# Patient Record
Sex: Female | Born: 1940 | Race: White | Hispanic: No | Smoking: Never smoker
Health system: Southern US, Community
[De-identification: ages and names within clinical notes are randomized; demographics above are authoritative.]

## PROBLEM LIST (undated history)

## (undated) DIAGNOSIS — I10 Essential (primary) hypertension: Secondary | ICD-10-CM

## (undated) DIAGNOSIS — F32A Depression, unspecified: Secondary | ICD-10-CM

## (undated) DIAGNOSIS — K8020 Calculus of gallbladder without cholecystitis without obstruction: Secondary | ICD-10-CM

## (undated) DIAGNOSIS — E7800 Pure hypercholesterolemia, unspecified: Secondary | ICD-10-CM

## (undated) DIAGNOSIS — Z9889 Other specified postprocedural states: Secondary | ICD-10-CM

## (undated) DIAGNOSIS — F0390 Unspecified dementia without behavioral disturbance: Secondary | ICD-10-CM

## (undated) DIAGNOSIS — F419 Anxiety disorder, unspecified: Secondary | ICD-10-CM

## (undated) DIAGNOSIS — I499 Cardiac arrhythmia, unspecified: Secondary | ICD-10-CM

## (undated) DIAGNOSIS — F329 Major depressive disorder, single episode, unspecified: Secondary | ICD-10-CM

## (undated) DIAGNOSIS — K219 Gastro-esophageal reflux disease without esophagitis: Secondary | ICD-10-CM

## (undated) DIAGNOSIS — K635 Polyp of colon: Secondary | ICD-10-CM

## (undated) DIAGNOSIS — H269 Unspecified cataract: Secondary | ICD-10-CM

## (undated) DIAGNOSIS — R112 Nausea with vomiting, unspecified: Secondary | ICD-10-CM

## (undated) HISTORY — PX: DILATION AND CURETTAGE OF UTERUS: SHX78

## (undated) HISTORY — DX: Polyp of colon: K63.5

## (undated) HISTORY — PX: APPENDECTOMY: SHX54

## (undated) HISTORY — PX: TUBAL LIGATION: SHX77

## (undated) HISTORY — PX: CHOLECYSTECTOMY: SHX55

## (undated) HISTORY — DX: Anxiety disorder, unspecified: F41.9

## (undated) HISTORY — DX: Unspecified cataract: H26.9

## (undated) HISTORY — DX: Pure hypercholesterolemia, unspecified: E78.00

## (undated) HISTORY — DX: Calculus of gallbladder without cholecystitis without obstruction: K80.20

## (undated) HISTORY — DX: Major depressive disorder, single episode, unspecified: F32.9

## (undated) HISTORY — DX: Essential (primary) hypertension: I10

## (undated) HISTORY — DX: Gastro-esophageal reflux disease without esophagitis: K21.9

## (undated) HISTORY — DX: Depression, unspecified: F32.A

---

## 1997-10-30 ENCOUNTER — Encounter: Admission: RE | Admit: 1997-10-30 | Discharge: 1998-01-28 | Payer: Self-pay | Admitting: Family Medicine

## 1998-03-11 ENCOUNTER — Other Ambulatory Visit: Admission: RE | Admit: 1998-03-11 | Discharge: 1998-03-11 | Payer: Self-pay | Admitting: Gynecology

## 2000-01-19 ENCOUNTER — Other Ambulatory Visit: Admission: RE | Admit: 2000-01-19 | Discharge: 2000-01-19 | Payer: Self-pay | Admitting: Gynecology

## 2001-06-29 ENCOUNTER — Other Ambulatory Visit: Admission: RE | Admit: 2001-06-29 | Discharge: 2001-06-29 | Payer: Self-pay | Admitting: Gynecology

## 2004-03-05 ENCOUNTER — Other Ambulatory Visit: Admission: RE | Admit: 2004-03-05 | Discharge: 2004-03-05 | Payer: Self-pay | Admitting: Gynecology

## 2004-11-30 ENCOUNTER — Ambulatory Visit: Payer: Self-pay | Admitting: Family Medicine

## 2005-01-20 ENCOUNTER — Ambulatory Visit: Payer: Self-pay | Admitting: Family Medicine

## 2005-02-22 ENCOUNTER — Ambulatory Visit: Payer: Self-pay | Admitting: Family Medicine

## 2005-03-24 ENCOUNTER — Ambulatory Visit: Payer: Self-pay | Admitting: Family Medicine

## 2005-04-29 ENCOUNTER — Ambulatory Visit: Payer: Self-pay | Admitting: Family Medicine

## 2005-06-16 ENCOUNTER — Ambulatory Visit: Payer: Self-pay | Admitting: Family Medicine

## 2005-06-22 ENCOUNTER — Other Ambulatory Visit: Admission: RE | Admit: 2005-06-22 | Discharge: 2005-06-22 | Payer: Self-pay | Admitting: Gynecology

## 2005-07-08 ENCOUNTER — Encounter (INDEPENDENT_AMBULATORY_CARE_PROVIDER_SITE_OTHER): Payer: Self-pay | Admitting: *Deleted

## 2005-07-08 ENCOUNTER — Ambulatory Visit (HOSPITAL_COMMUNITY): Admission: RE | Admit: 2005-07-08 | Discharge: 2005-07-08 | Payer: Self-pay | Admitting: Gynecology

## 2005-08-30 ENCOUNTER — Ambulatory Visit: Payer: Self-pay | Admitting: Family Medicine

## 2006-01-10 ENCOUNTER — Ambulatory Visit: Payer: Self-pay | Admitting: Family Medicine

## 2006-03-16 ENCOUNTER — Ambulatory Visit: Payer: Self-pay | Admitting: Family Medicine

## 2006-04-25 ENCOUNTER — Encounter (INDEPENDENT_AMBULATORY_CARE_PROVIDER_SITE_OTHER): Payer: Self-pay | Admitting: Specialist

## 2006-04-25 ENCOUNTER — Ambulatory Visit (HOSPITAL_COMMUNITY): Admission: RE | Admit: 2006-04-25 | Discharge: 2006-04-25 | Payer: Self-pay | Admitting: Gynecology

## 2006-08-01 ENCOUNTER — Ambulatory Visit: Payer: Self-pay | Admitting: Family Medicine

## 2006-11-02 ENCOUNTER — Ambulatory Visit: Payer: Self-pay | Admitting: Family Medicine

## 2006-11-04 ENCOUNTER — Encounter: Payer: Self-pay | Admitting: Cardiology

## 2006-11-04 ENCOUNTER — Ambulatory Visit: Payer: Self-pay | Admitting: Cardiology

## 2007-08-16 ENCOUNTER — Encounter: Admission: RE | Admit: 2007-08-16 | Discharge: 2007-08-16 | Payer: Self-pay | Admitting: Family Medicine

## 2007-08-24 ENCOUNTER — Encounter (INDEPENDENT_AMBULATORY_CARE_PROVIDER_SITE_OTHER): Payer: Self-pay | Admitting: Gynecology

## 2007-08-24 ENCOUNTER — Ambulatory Visit (HOSPITAL_COMMUNITY): Admission: RE | Admit: 2007-08-24 | Discharge: 2007-08-24 | Payer: Self-pay | Admitting: Gynecology

## 2008-05-14 HISTORY — PX: ARTHROSCOPIC REPAIR ACL: SUR80

## 2008-06-25 ENCOUNTER — Encounter: Admission: RE | Admit: 2008-06-25 | Discharge: 2008-09-23 | Payer: Self-pay | Admitting: Orthopedic Surgery

## 2008-07-18 ENCOUNTER — Ambulatory Visit: Payer: Self-pay

## 2010-03-03 ENCOUNTER — Ambulatory Visit: Payer: Self-pay | Admitting: Cardiology

## 2010-03-11 ENCOUNTER — Telehealth (INDEPENDENT_AMBULATORY_CARE_PROVIDER_SITE_OTHER): Payer: Self-pay | Admitting: *Deleted

## 2010-03-12 ENCOUNTER — Ambulatory Visit: Payer: Self-pay

## 2010-03-12 ENCOUNTER — Encounter: Payer: Self-pay | Admitting: Cardiology

## 2010-03-12 ENCOUNTER — Encounter: Payer: Self-pay | Admitting: Internal Medicine

## 2010-03-12 ENCOUNTER — Encounter (HOSPITAL_COMMUNITY)
Admission: RE | Admit: 2010-03-12 | Discharge: 2010-03-20 | Payer: Self-pay | Source: Home / Self Care | Admitting: Cardiology

## 2010-03-12 ENCOUNTER — Ambulatory Visit: Payer: Self-pay | Admitting: Cardiology

## 2010-03-17 ENCOUNTER — Ambulatory Visit: Payer: Self-pay | Admitting: Cardiology

## 2010-07-05 ENCOUNTER — Encounter: Payer: Self-pay | Admitting: Family Medicine

## 2010-07-16 NOTE — Assessment & Plan Note (Signed)
Summary: Cardiology Nuclear Testing  Nuclear Med Background Indications for Stress Test: Evaluation for Ischemia     Symptoms: Chest Pain, DOE, SOB    Nuclear Pre-Procedure Cardiac Risk Factors: Hypertension, Lipids Caffeine/Decaff Intake: None NPO After: 7:30 AM Lungs: clear IV 0.9% NS with Angio Cath: 22g     IV Site: R Hand IV Started by: Bonnita Levan, RN Chest Size (in) 42     Cup Size D     Height (in): 62 Weight (lb): 225 BMI: 41.30  Nuclear Med Study 1 or 2 day study:  1 day     Stress Test Type:  Treadmill/Lexiscan Reading MD:  Olga Millers, MD     Referring MD:  S.Tennant Resting Radionuclide:  Technetium 56m Tetrofosmin     Resting Radionuclide Dose:  11 mCi  Stress Radionuclide:  Technetium 22m Tetrofosmin     Stress Radionuclide Dose:  32.9 mCi   Stress Protocol   Lexiscan: 0.4 mg   Stress Test Technologist:  Milana Na, EMT-P     Nuclear Technologist:  Domenic Polite, CNMT  Rest Procedure  Myocardial perfusion imaging was performed at rest 45 minutes following the intravenous administration of Technetium 6m Tetrofosmin.  Stress Procedure  The patient received IV Lexiscan 0.4 mg over 15-seconds with concurrent low level exercise and then Technetium 25m Tetrofosmin was injected at 30-seconds while the patient continued walking one more minute.  There were no significant changes with Lexiscan.  Quantitative spect images were obtained after a 45 minute delay.  QPS Raw Data Images:  There is a breast shadow. Stress Images:  There is decreased uptake in the distal anterior wall. Rest Images:  There is decreased uptake in the distal anterior wall, less prominent compared to the stress images. Subtraction (SDS):  These findings are consistent with probable shifting breast attenuation; cannot exclude mild anteroapical ischemia. Transient Ischemic Dilatation:  .64  (Normal <1.22)  Lung/Heart Ratio:  .22  (Normal <0.45)  Quantitative Gated Spect  Images QGS EDV:  44 ml QGS ESV:  13 ml QGS EF:  71 % QGS cine images:  Normal wall motion.   Overall Impression  Exercise Capacity: Lexiscan with no exercise. BP Response: Normal blood pressure response. Clinical Symptoms: No chest pain ECG Impression: No significant ST segment change suggestive of ischemia. Overall Impression: Probably normal lexiscan nuclear study with shifting breast attenuation; cannot exclude mild anteroapical ischemia.

## 2010-07-16 NOTE — Progress Notes (Signed)
Summary: nuc pre procedure  Phone Note Outgoing Call Call back at Home Phone [phone]   Call placed by: Cathlyn Parsons RN,  March 11, 2010 5:29 PM Call placed to: Patient Reason for Call: Confirm/change Appt Summary of Call: Reviewed information on My[employer] Information Sheet (see scanned document for further details).  Spoke with patient.      Nuclear Med Background Indications for Stress Test: Evaluation for Ischemia     Symptoms: Chest Pain, SOB    Nuclear Pre-Procedure Cardiac Risk Factors: Hypertension, Lipids

## 2010-10-27 NOTE — Op Note (Signed)
Donna Barron, Donna Barron                ACCOUNT NO.:  [id number]   MEDICAL RECORD NO.:  [id number]          PATIENT TYPE:  AMB   LOCATION:  SDC                           [facility]ILITY:  [facility]   PHYSICIAN:  Luvenia Redden, M.D.   DATE OF BIRTH:  [birth year]   DATE OF PROCEDURE:  08/24/2007  DATE OF DISCHARGE:  08/24/2007                               OPERATIVE REPORT   POSTOPERATIVE DIAGNOSIS:  Postmenopausal bleeding.   POSTOPERATIVE DIAGNOSIS:  1. Postmenopausal bleeding.  2. Small endometrial polyp.  3. Uterine myoma.   PROCEDURE:  Hysteroscopy and dilatation and curettage.   SURGEON:  Luvenia Redden, M.D.   ANESTHESIA:  Monitored anesthesia care plus paracervical block.   PROCEDURE:  Under sedation, the patient was placed in the lithotomy  position and prepped and draped in a sterile manner.  The pelvic exam  was really nonrevealing.  Due to the patient's girth and her abdomen, it  is very difficult to palpate anything.  I did not feel any masses.  The  cervix was grasped with a tenaculum and a paracervical block was  performed by injecting 1% Lidocaine 10 mL in the 4 o'clock position and  the 8 o'clock position paracervically.  The uterus then sounded to a  depth of 10.5 cm.  The cervix was dilated.  The hysteroscope was placed  into the cervical canal using Sorbitol as the distending medium.  The  scope was advanced in the cervical canal and the endometrial cavity were  examined.  There were a couple of small polypoid structures just inside  the internal os on the anterior right hand side.  There was some reddish  appearing endometrium but no large polyps seen.  Both ostia were viewed  and were normal.  Centrally in the fundus there was a defect protruding  into the endometrial cavity that was either a partial septum or what I  believed to be a submucous myoma.  The scope was then retracted and then  the cervical curettage was done and this was sent as a separate  specimen.  A  thorough endometrial curettage was then done and a small to  moderate amount of tissue was obtained.  The endometrial cavity was then  wiped with a dry sponge and was clean.  The procedure was then  terminated.  There was no excess bleeding.  Blood loss was minimal.  Fluid deficit of Sorbitol was 20 mL.  The patient tolerated the  procedure very nicely.  She was removed to recovery in good condition.           ______________________________  Luvenia Redden, M.D.     WSB/MEDQ  D:  08/24/2007  T:  08/24/2007  Job:  [id number]

## 2010-10-30 NOTE — Op Note (Signed)
NAMEMEILI, KLECKLEY NO.:  [id number]   MEDICAL RECORD NO.:  [id number]          PATIENT TYPE:  AMB   LOCATION:  SDC                           [facility]ILITY:  [facility]   PHYSICIAN:  Luvenia Redden, M.D.   DATE OF BIRTH:  September 18, 1940   DATE OF PROCEDURE:  04/25/2006  DATE OF DISCHARGE:                                 OPERATIVE REPORT   PREOPERATIVE DIAGNOSIS:  Postmenopausal bleeding.   POSTOPERATIVE DIAGNOSIS:  Postmenopausal bleeding.   OPERATION PERFORMED:  Dilation and curettage.   SURGEON:  Luvenia Redden, M.D.   ANESTHESIA:  MAC and paracervical block.   DESCRIPTION OF PROCEDURE:  Under good sedation, the patient prepped and  draped in sterile manner, the bladder was catheterized.  Exam revealed the  uterus to be retroflexed, slightly enlarged, no adnexal masses were  palpable.  The cervix was grasped with a tenaculum.  Paracervical block was  performed by injecting 9 mL of 1% lidocaine at 4 and 8 o'clock positions  paracervically.  Once this was accomplished, the uterus sounded to a depth  of four inches. The cervix was dilated. An endocervical curettage was done  and this was sent as a separate specimen.  The endometrial cavity was then  explored with the polyp forceps and one solitary polyp approximately 1 cm in  diameter was obtained.  This was sent with the endometrial specimen.  The  endometrial cavity was then scraped thoroughly with a sharp curette.  A  small amount of normal-appearing tissue was obtained.  The endometrial  cavity was wiped then with a dry sponge and deemed to be clean.  The cavity  again was explored with polyp forceps and no tissue was obtained.  The  patient tolerated the procedure well.  She was given some IV Toradol and  removed to recovery room in good condition.           ______________________________  Luvenia Redden, M.D.     WSB/MEDQ  D:  04/25/2006  T:  04/25/2006  Job:  [id number]

## 2010-10-30 NOTE — Op Note (Signed)
NAMEJAELIN, DEVINCENTIS NO.:  [id number]   MEDICAL RECORD NO.:  [id number]          PATIENT TYPE:  AMB   LOCATION:  SDC                           [facility]ILITY[facility]   PHYSICIAN:  Luvenia Redden, M.D.   DATE OF BIRTH:  1940/10/11   DATE OF PROCEDURE:  07/08/2005  DATE OF DISCHARGE:                                 OPERATIVE REPORT   PREOPERATIVE DIAGNOSIS:  Postmenopausal bleeding.   POSTOPERATIVE DIAGNOSES:  1.  Postmenopausal bleeding.  2.  Small uterine fibroids.   OPERATION:  Dilatation and curettage.   SURGEON:  Luvenia Redden, M.D.   PROCEDURE:  Under good sedation with Diprivan, the patient was placed in the  lithotomy position and prepped and draped in a sterile manner.  The bladder  was catheterized.  The uterus was retroflexed and slightly enlarged and  irregular.  The cervix was grasped with a tenaculum and the uterine cavity  sounded to a depth of 10.5 cm.  The cervix was dilated without difficulty.  The cavity was explored with polyp forceps and minimal tissue was obtained.  Endocervical curettage was done, and this was sent as a separate specimen.  A thorough curettage of the uterine cavity was done and a small to moderate  amount of tissue was obtained.  The endometrial cavity was wiped with a dry  sponge and was clean.  Blood loss was estimated at 25 mL, none was replaced.  The patient tolerated the procedure well and was removed to the recovery  room in good condition.           ______________________________  Luvenia Redden, M.D.     WSB/MEDQ  D:  07/08/2005  T:  07/08/2005  Job:  [id number]

## 2011-03-08 LAB — BASIC METABOLIC PANEL
CO2: 31
Chloride: 96
GFR calc Af Amer: >60
Glucose, Bld: 121 — ABNORMAL HIGH
Sodium: 138

## 2011-03-08 LAB — CBC
HCT: 46.4 — ABNORMAL HIGH
MCV: 89.9

## 2011-11-17 ENCOUNTER — Encounter: Payer: Self-pay | Admitting: Cardiology

## 2013-06-28 ENCOUNTER — Ambulatory Visit (INDEPENDENT_AMBULATORY_CARE_PROVIDER_SITE_OTHER): Payer: Medicare Other | Admitting: Interventional Cardiology

## 2013-06-28 ENCOUNTER — Encounter: Payer: Self-pay | Admitting: Cardiology

## 2013-06-28 ENCOUNTER — Encounter: Payer: Self-pay | Admitting: Interventional Cardiology

## 2013-06-28 VITALS — BP 137/76 | HR 69 | Ht 62.0 in | Wt 217.8 lb

## 2013-06-28 DIAGNOSIS — R002 Palpitations: Secondary | ICD-10-CM | POA: Insufficient documentation

## 2013-06-28 DIAGNOSIS — E669 Obesity, unspecified: Secondary | ICD-10-CM

## 2013-06-28 DIAGNOSIS — R079 Chest pain, unspecified: Secondary | ICD-10-CM

## 2013-06-28 DIAGNOSIS — E782 Mixed hyperlipidemia: Secondary | ICD-10-CM | POA: Insufficient documentation

## 2013-06-28 NOTE — Patient Instructions (Signed)
Your physician has recommended that you wear an event monitor. Event monitors are medical devices that record the heart's electrical activity. Doctors most often [place] these monitors to diagnose arrhythmias. Arrhythmias are problems with the speed or rhythm of the heartbeat. The monitor is a small, portable device. You can wear one while you do your normal daily activities. This is usually used to diagnose what is causing palpitations/syncope (passing out).  Your physician recommends that you schedule a follow-up appointment in: 6 weeks with Dr. Irish Lack.

## 2013-06-28 NOTE — Progress Notes (Signed)
Patient ID: Donna Barron, female   DOB: [birth year], 73 y.o.   MRN: [id number]     Patient ID: Donna Barron MRN: [id number] DOB/AGE: Nov 18, 1940 73 y.o.   Referring Physician Dr. Edrick Oh   Reason for Consultation palpitations  HPI: 6 y/o who has had palpitations for years. She was on a beta blocker for this. She saw Dr. Doreatha Lew and had a stress test and maybe a monitor, all of which were normal.  Now, the palpitations happen anywhere from 1/week to 1 in several months.  Sx last a few minutes.  No lightheadedness or syncope.  During the palpitations, there is some chest tightness.  She notes a drop in her BP.  No dizziness with that.  On New Years day, she had 4 episodes which were short.  She woke up sweaty with some sensation in her neck that her heart was beating fast.    No regular walking or exercise.  No problems with waking in the grocery store.  She wants to join the [facility] again. No problems walking the dog.    Current Outpatient Prescriptions  Medication Sig Dispense Refill  . aspirin 81 MG tablet Take 81 mg by mouth daily.      . citalopram (CELEXA) 20 MG tablet Take 20 mg by mouth daily.       Marland Kitchen ezetimibe-simvastatin (VYTORIN) 10-20 MG per tablet Take 1 tablet by mouth daily.      . fluticasone (FLONASE) 50 MCG/ACT nasal spray Place 1 spray into both nostrils daily.      . metoprolol succinate (TOPROL-XL) 50 MG 24 hr tablet Take 75 mg by mouth daily.       . pantoprazole (PROTONIX) 40 MG tablet Take 40 mg by mouth daily.       Marland Kitchen triamterene-hydrochlorothiazide (MAXZIDE) 75-50 MG per tablet Take 0.5 tablets by mouth daily.        No current facility-administered medications for this visit.   Past Medical History  Diagnosis Date  . Hypercholesteremia   . HTN (hypertension)   . GERD (gastroesophageal reflux disease)   . Depression     Family History  Problem Relation Age of Onset  . Heart failure Mother 8  . Lung cancer Father 45    History   Social History  .  Marital Status: Widowed    Spouse Name: N/A    Number of Children: N/A  . Years of Education: N/A   Occupational History  . retired Other   Social History Main Topics  . Smoking status: Never Smoker   . Smokeless tobacco: Not on file  . Alcohol Use: Yes     Comment: rarely  . Drug Use: Not on file  . Sexual Activity: Not on file   Other Topics Concern  . Not on file   Social History Narrative  . No narrative on file    Past Surgical History  Procedure Laterality Date  . Cholecystectomy    . Appendectomy    . Tubal ligation    . Dilation and curettage of uterus    . Arthroscopic repair acl  05/2008    Left knee      (Not in a hospital admission)  Review of systems complete and found to be negative unless listed above .  No nausea, vomiting.  No fever chills, No focal weakness,  No palpitations.  Physical Exam: Filed Vitals:   06/28/13 1101  BP: 137/76  Pulse: 69    Weight: 217 lb 12.8 oz (98.793  kg)  Physical exam:  [place]/AT EOMI No JVD, No carotid bruit RRR S1S2  No wheezing Soft. NT, nondistended No edema. No focal motor or sensory deficits Normal affect  Labs:   Lab Results  Component Value Date   WBC 8.0 08/21/2007   HGB 15.8* 08/21/2007   HCT 46.4* 08/21/2007   MCV 89.9 08/21/2007   PLT 333 08/21/2007   No results found for this basename: NA, K, CL, CO2, BUN, CREATININE, CALCIUM, LABALBU, PROT, BILITOT, ALKPHOS, ALT, AST, GLUCOSE,  in the last 168 hours No results found for this basename: CKTOTAL, CKMB, CKMBINDEX, TROPONINI    No results found for this basename: CHOL   No results found for this basename: HDL   No results found for this basename: LDLCALC   No results found for this basename: TRIG   No results found for this basename: CHOLHDL   No results found for this basename: LDLDIRECT      Radiology: Prior stress test in 2011 showed possible anteroapical area of ischemia, but this is thought to be from breast attenuation. Echocardiogram from  2008 showed normal left ventricular function. EKG: Normal sinus rhythm, no significant ST segment changes  ASSESSMENT AND PLAN:   1) palpitations: Unclear etiology. Events are somewhat rare. We'll plan for a 30 day event monitor. Hopefully, we can capture whatever her arrhythmia is. She will continue on Toprol at this time.  No associated syncope or lightheadedness. I doubt this is a life-threatening arrhythmia.  I reviewed the results of her prior cardiac workup as noted above.  2) Obesity: I encouraged her to join the [employer] as she is planning.  If she develops any symptoms with exercise, she will let us know.  3) hyperlipidemia: Continue Vytorin. She has stopped this medication on her own. I stressed the importance of taking a lipid-lowering therapy.  4) chest discomfort: This only occurs with the palpitations. If this starts to get more frequent, we would consider repeat stress testing versus cardiac catheterization. Right now, she has no exertional chest discomfort. We'll see she does with increased activity. Continue beta blocker.  Continue aspirin.   Signed:   Mina Marble, MD, [facility] 06/28/2013, 11:24 AM

## 2013-07-02 ENCOUNTER — Encounter: Payer: Self-pay | Admitting: *Deleted

## 2013-07-02 ENCOUNTER — Encounter (INDEPENDENT_AMBULATORY_CARE_PROVIDER_SITE_OTHER): Payer: Medicare Other

## 2013-07-02 DIAGNOSIS — R002 Palpitations: Secondary | ICD-10-CM

## 2013-07-02 NOTE — Progress Notes (Unsigned)
Patient ID: Donna Barron, female   DOB: [birth year], 73 y.o.   MRN: [id number] [employer] 30 day cardiac event monitor applied to patient.

## 2013-08-07 ENCOUNTER — Ambulatory Visit: Payer: PRIVATE HEALTH INSURANCE | Admitting: Interventional Cardiology

## 2013-08-17 ENCOUNTER — Telehealth: Payer: Self-pay | Admitting: Cardiology

## 2013-08-17 NOTE — Telephone Encounter (Signed)
Dr. Hassell Done Interpretation of 30 day heart monitor: NSR with PVC's. No pathological arrhythmias.

## 2013-08-22 NOTE — Telephone Encounter (Signed)
Pt.notified

## 2013-09-27 ENCOUNTER — Encounter (INDEPENDENT_AMBULATORY_CARE_PROVIDER_SITE_OTHER): Payer: Self-pay

## 2013-09-27 ENCOUNTER — Encounter: Payer: Self-pay | Admitting: Interventional Cardiology

## 2013-09-27 ENCOUNTER — Ambulatory Visit (INDEPENDENT_AMBULATORY_CARE_PROVIDER_SITE_OTHER): Payer: Medicare Other | Admitting: Interventional Cardiology

## 2013-09-27 VITALS — BP 118/73 | HR 77 | Ht 62.0 in | Wt 217.8 lb

## 2013-09-27 DIAGNOSIS — R002 Palpitations: Secondary | ICD-10-CM

## 2013-09-27 DIAGNOSIS — E669 Obesity, unspecified: Secondary | ICD-10-CM

## 2013-09-27 DIAGNOSIS — E782 Mixed hyperlipidemia: Secondary | ICD-10-CM

## 2013-09-27 NOTE — Patient Instructions (Signed)
Your physician recommends that you schedule a follow-up appointment as needed  

## 2013-09-27 NOTE — Progress Notes (Signed)
Patient ID: Donna Barron, female   DOB: 15-Sep-1940, 73 y.o.   MRN: [id number] Patient ID: Donna Barron, female   DOB: [birth year], 73 y.o.   MRN: [id number]     Patient ID: Donna Barron MRN: [id number] DOB/AGE: January 26, 1941 73 y.o   Referring Physician Dr. Edrick Oh   Reason for Consultation palpitations  HPI: 73 y.o who has had palpitations for years. She was on a beta blocker for this. She saw Dr. Doreatha Lew and had a stress test and maybe a monitor, all of which were normal.  Now, the palpitations happen anywhere from 1/week to 1 in several months.  Sx last a few minutes.  No lightheadedness or syncope.  During the palpitations, there is some chest tightness.  She notes a drop in her BP.  No dizziness with that.  On New Years day, she had 4 episodes which were short.  She woke up sweaty with some sensation in her neck that her heart was beating fast.    Now using treadmill for walking/ exercise.  No further palpitations. No chest pain.   Current Outpatient Prescriptions  Medication Sig Dispense Refill  . aspirin 81 MG tablet Take 81 mg by mouth daily.      . citalopram (CELEXA) 20 MG tablet Take 20 mg by mouth daily.       . fluticasone (FLONASE) 50 MCG/ACT nasal spray Place 1 spray into both nostrils daily.      . metoprolol succinate (TOPROL-XL) 50 MG 24 hr tablet Take 75 mg by mouth daily.       . pantoprazole (PROTONIX) 40 MG tablet Take 40 mg by mouth daily.       . simvastatin (ZOCOR) 40 MG tablet       . triamterene-hydrochlorothiazide (MAXZIDE) 75-50 MG per tablet Take 0.5 tablets by mouth daily.        No current facility-administered medications for this visit.   Past Medical History  Diagnosis Date  . Hypercholesteremia   . HTN (hypertension)   . GERD (gastroesophageal reflux disease)   . Depression     Family History  Problem Relation Age of Onset  . Heart failure Mother 40  . Lung cancer Father 55    History   Social History  . Marital Status: Widowed   Spouse Name: N/A    Number of Children: N/A  . Years of Education: N/A   Occupational History  . retired Other   Social History Main Topics  . Smoking status: Never Smoker   . Smokeless tobacco: Not on file  . Alcohol Use: Yes     Comment: rarely  . Drug Use: Not on file  . Sexual Activity: Not on file   Other Topics Concern  . Not on file   Social History Narrative  . No narrative on file    Past Surgical History  Procedure Laterality Date  . Cholecystectomy    . Appendectomy    . Tubal ligation    . Dilation and curettage of uterus    . Arthroscopic repair acl  05/2008    Left knee      (Not in a hospital admission)  Review of systems complete and found to be negative unless listed above .  No nausea, vomiting.  No fever chills, No focal weakness,  No palpitations.  Physical Exam: Filed Vitals:   09/27/13 1111  BP: 118/73  Pulse: 77    Weight: 217 lb 12.8 oz (98.793 kg)  Physical exam:  [place]/AT EOMI  No JVD, No carotid bruit RRR S1S2  No wheezing Soft. NT, nondistended No edema. No focal motor or sensory deficits Normal affect  Labs:   Lab Results  Component Value Date   WBC 8.0 08/21/2007   HGB 15.8* 08/21/2007   HCT 46.4* 08/21/2007   MCV 89.9 08/21/2007   PLT 333 08/21/2007   No results found for this basename: NA, K, CL, CO2, BUN, CREATININE, CALCIUM, LABALBU, PROT, BILITOT, ALKPHOS, ALT, AST, GLUCOSE,  in the last 168 hours No results found for this basename: CKTOTAL,  CKMB,  CKMBINDEX,  TROPONINI    No results found for this basename: CHOL   No results found for this basename: HDL   No results found for this basename: LDLCALC   No results found for this basename: TRIG   No results found for this basename: CHOLHDL   No results found for this basename: LDLDIRECT      Radiology: Prior stress test in 2011 showed possible anteroapical area of ischemia, but this is thought to be from breast attenuation. Echocardiogram from 2008 showed normal left  ventricular function. EKG: Normal sinus rhythm, no significant ST segment changes  ASSESSMENT AND PLAN:   1) palpitations:  30 day event monitor showed NSR with PVCs.  No pathologic arrhythmias. She will continue on Toprol at this time.  No associated syncope or lightheadedness.  Sx have not returned since New Years Day.  2) Obesity: I encouraged her to join the [facility] as she is planning.  If she develops any symptoms with exercise, she will let us know.  3) hyperlipidemia: Was on Vytorin. She has stopped this medication on her own. She is now on simvastatin- to be checked by Dr. Edrick Oh. I stressed the importance of taking a lipid-lowering therapy.  4) chest discomfort: Resolved since palpitations are gone. Continue beta blocker.  Continue aspirin.   Signed:   Mina Marble, MD, [facility] 09/27/2013, 11:54 AM

## 2013-10-29 ENCOUNTER — Other Ambulatory Visit: Payer: Self-pay | Admitting: Gynecology

## 2013-11-15 DIAGNOSIS — I10 Essential (primary) hypertension: Secondary | ICD-10-CM | POA: Insufficient documentation

## 2013-12-10 ENCOUNTER — Encounter (HOSPITAL_COMMUNITY): Payer: Self-pay | Admitting: Pharmacy Technician

## 2013-12-17 ENCOUNTER — Other Ambulatory Visit: Payer: Self-pay | Admitting: Obstetrics and Gynecology

## 2013-12-17 NOTE — Patient Instructions (Addendum)
Your procedure is scheduled on: Tuesday, December 25, 2013  Enter through the [employer] [facility] at: [place] up the phone at the desk and dial [phone].  Call this number if you have problems the morning of surgery: [phone].  Remember: Do NOT eat food: After midnight Monday, December 24, 2013 Do NOT drink clear liquids after: After midnight Monday, December 24, 2013 Take these medicines the morning of surgery with a SIP OF WATER: Metoprolol, Triamt/HCTZ, Protonix,   Do NOT wear jewelry (body piercing), metal hair clips/bobby pins, make-up, or nail polish. Do NOT wear lotions, powders, or perfumes.  You may wear deoderant. Do NOT shave for 48 hours prior to surgery. Do NOT bring valuables to the hospital. Contacts, dentures, or bridgework may not be worn into surgery. Have a responsible adult drive you home and stay with you for 24 hours after your procedure

## 2013-12-19 ENCOUNTER — Encounter (HOSPITAL_COMMUNITY)
Admission: RE | Admit: 2013-12-19 | Discharge: 2013-12-19 | Disposition: A | Discharge status: Home / Self Care | Payer: Medicare Other | Source: Ambulatory Visit | Attending: Obstetrics and Gynecology | Admitting: Obstetrics and Gynecology

## 2013-12-19 ENCOUNTER — Encounter (HOSPITAL_COMMUNITY): Payer: Self-pay

## 2013-12-19 DIAGNOSIS — Z01812 Encounter for preprocedural laboratory examination: Secondary | ICD-10-CM | POA: Insufficient documentation

## 2013-12-19 HISTORY — DX: Other specified postprocedural states: Z98.890

## 2013-12-19 HISTORY — DX: Other specified postprocedural states: R11.2

## 2013-12-19 HISTORY — DX: Cardiac arrhythmia, unspecified: I49.9

## 2013-12-19 LAB — CBC
HCT: 45.2 % (ref 36.0–46.0)
HEMOGLOBIN: 15.2 g/dL — AB (ref 12.0–15.0)
MCH: 30.6 pg (ref 26.0–34.0)
MCHC: 33.6 g/dL (ref 30.0–36.0)
MCV: 91.1 fL (ref 78.0–100.0)
Platelets: 295 10*3/uL (ref 150–400)
RBC: 4.96 MIL/uL (ref 3.87–5.11)
RDW: 13.7 % (ref 11.5–15.5)
WBC: 8.7 10*3/uL (ref 4.0–10.5)

## 2013-12-19 LAB — BASIC METABOLIC PANEL
ANION GAP: 10 (ref 5–15)
BUN: 11 mg/dL (ref 6–23)
CO2: 32 meq/L (ref 19–32)
CREATININE: 0.93 mg/dL (ref 0.50–1.10)
Calcium: 9.7 mg/dL (ref 8.4–10.5)
Chloride: 99 mEq/L (ref 96–112)
GFR, EST AFRICAN AMERICAN: 69 mL/min — AB (ref >90)
GFR, EST NON AFRICAN AMERICAN: 60 mL/min — AB (ref >90)
GLUCOSE: 92 mg/dL (ref 70–99)
POTASSIUM: 3.9 meq/L (ref 3.7–5.3)
Sodium: 141 mEq/L (ref 137–147)

## 2013-12-24 NOTE — H&P (Cosign Needed)
Donna Barron is an 73 y.o. female who presents with a history of vaginal bleeding. Office ultrasound reveals an endometrial stripe of 49mm and due to stenosis of cervix it was not possible to do an office biopsy. She is therefore being taken to the operating room to undergo a D&C and hysteroscopy.  Pertinent Gynecological History:  Bleeding: post menopausal bleeding   Past Medical History  Diagnosis Date  . Hypercholesteremia   . HTN (hypertension)   . GERD (gastroesophageal reflux disease)   . Depression   . Dysrhythmia     heart palipations  . PONV (postoperative nausea and vomiting)     Past Surgical History  Procedure Laterality Date  . Cholecystectomy    . Appendectomy    . Tubal ligation    . Dilation and curettage of uterus    . Arthroscopic repair acl  05/2008    Left knee    Family History  Problem Relation Age of Onset  . Heart failure Mother 60  . Lung cancer Father 47    Social History:  reports that she has never smoked. She has never used smokeless tobacco. She reports that she drinks alcohol. She reports that she does not use illicit drugs.  Allergies:  Allergies  Allergen Reactions  . Avelox [Moxifloxacin Hcl In Nacl] Other (See Comments)    insomnia    No prescriptions prior to admission    Review of Systems  Constitutional: Negative for fever and chills.  Respiratory: Negative for cough, hemoptysis and shortness of breath.   Cardiovascular: Negative for chest pain and palpitations.  Gastrointestinal: Positive for heartburn. Negative for diarrhea and constipation.  Genitourinary: Negative for dysuria and urgency.  Gyn: positive for vaginal bleeding. No discharge or itch.   There were no vitals taken for this visit. Physical Exam  Constitutional: She appears well-developed and well-nourished.  HENT:  Head: Normocephalic and atraumatic.  Eyes: Conjunctivae are normal. Pupils are equal, round, and reactive to light.  Cardiovascular: Normal  rate, regular rhythm and normal heart sounds.   Respiratory: Effort normal and breath sounds normal.  GI: Soft. Bowel sounds are normal. She exhibits no mass. There is no tenderness. There is no rebound and no guarding.  Gyn:   External genitalia : WNL  BUS: WNL  Vagina: without mass no lesions or abnormal discharge noted  Cervix: Without lesion, stenotic os  Uterus: Normal size shape and position  Adnexa: without mass  Assessment/Plan:  Postmenopausal bleeding.  Plan: D&C and hysteroscopy  Risk and benefit discussed. Yishai Rehfeld 12/24/2013, 9:55 PM

## 2013-12-25 ENCOUNTER — Ambulatory Visit (HOSPITAL_COMMUNITY): Payer: Medicare Other | Admitting: Anesthesiology

## 2013-12-25 ENCOUNTER — Encounter (HOSPITAL_COMMUNITY): Payer: Medicare Other | Admitting: Anesthesiology

## 2013-12-25 ENCOUNTER — Encounter (HOSPITAL_COMMUNITY): Payer: Self-pay | Admitting: Anesthesiology

## 2013-12-25 ENCOUNTER — Encounter (HOSPITAL_COMMUNITY)
Admission: RE | Disposition: A | Discharge status: Home / Self Care | Payer: Self-pay | Source: Ambulatory Visit | Attending: Obstetrics and Gynecology

## 2013-12-25 ENCOUNTER — Ambulatory Visit (HOSPITAL_COMMUNITY)
Admission: RE | Admit: 2013-12-25 | Discharge: 2013-12-25 | Disposition: A | Discharge status: Home / Self Care | Payer: Medicare Other | Source: Ambulatory Visit | Attending: Obstetrics and Gynecology | Admitting: Obstetrics and Gynecology

## 2013-12-25 DIAGNOSIS — Z801 Family history of malignant neoplasm of trachea, bronchus and lung: Secondary | ICD-10-CM | POA: Insufficient documentation

## 2013-12-25 DIAGNOSIS — F3289 Other specified depressive episodes: Secondary | ICD-10-CM | POA: Diagnosis not present

## 2013-12-25 DIAGNOSIS — N950 Postmenopausal bleeding: Secondary | ICD-10-CM

## 2013-12-25 DIAGNOSIS — I10 Essential (primary) hypertension: Secondary | ICD-10-CM | POA: Diagnosis not present

## 2013-12-25 DIAGNOSIS — F329 Major depressive disorder, single episode, unspecified: Secondary | ICD-10-CM | POA: Diagnosis not present

## 2013-12-25 DIAGNOSIS — Z9089 Acquired absence of other organs: Secondary | ICD-10-CM | POA: Diagnosis not present

## 2013-12-25 DIAGNOSIS — K219 Gastro-esophageal reflux disease without esophagitis: Secondary | ICD-10-CM | POA: Insufficient documentation

## 2013-12-25 DIAGNOSIS — N882 Stricture and stenosis of cervix uteri: Secondary | ICD-10-CM | POA: Insufficient documentation

## 2013-12-25 DIAGNOSIS — Z9851 Tubal ligation status: Secondary | ICD-10-CM | POA: Insufficient documentation

## 2013-12-25 DIAGNOSIS — I499 Cardiac arrhythmia, unspecified: Secondary | ICD-10-CM | POA: Insufficient documentation

## 2013-12-25 DIAGNOSIS — E7800 Pure hypercholesterolemia, unspecified: Secondary | ICD-10-CM | POA: Diagnosis not present

## 2013-12-25 HISTORY — PX: HYSTEROSCOPY WITH D & C: SHX1775

## 2013-12-25 LAB — APTT: APTT: 23 s — AB (ref 24–37)

## 2013-12-25 LAB — PROTIME-INR
INR: 1.04 (ref 0.00–1.49)
PROTHROMBIN TIME: 13.6 s (ref 11.6–15.2)

## 2013-12-25 SURGERY — DILATATION AND CURETTAGE /HYSTEROSCOPY
Anesthesia: Monitor Anesthesia Care | Site: Vagina

## 2013-12-25 MED ORDER — LIDOCAINE HCL (CARDIAC) 20 MG/ML IV SOLN
INTRAVENOUS | Status: AC
  Filled 2013-12-25: qty 5

## 2013-12-25 MED ORDER — MEPERIDINE HCL 25 MG/ML IJ SOLN: 6.2500 mg | INTRAMUSCULAR | Status: DC | PRN

## 2013-12-25 MED ORDER — MIDAZOLAM HCL 2 MG/2ML IJ SOLN
INTRAMUSCULAR | Status: AC
  Filled 2013-12-25: qty 2

## 2013-12-25 MED ORDER — METOCLOPRAMIDE HCL 5 MG/ML IJ SOLN: 10.0000 mg | Freq: Once | INTRAMUSCULAR | Status: AC | PRN

## 2013-12-25 MED ORDER — KETOROLAC TROMETHAMINE 30 MG/ML IJ SOLN
INTRAMUSCULAR | Status: AC
  Filled 2013-12-25: qty 1

## 2013-12-25 MED ORDER — KETOROLAC TROMETHAMINE 30 MG/ML IJ SOLN
INTRAMUSCULAR | Status: DC | PRN
  Administered 2013-12-25: 15 mg via INTRAVENOUS

## 2013-12-25 MED ORDER — LIDOCAINE HCL 1 % IJ SOLN
INTRAMUSCULAR | Status: AC
  Filled 2013-12-25: qty 20

## 2013-12-25 MED ORDER — FENTANYL CITRATE 0.05 MG/ML IJ SOLN: 25.0000 ug | INTRAMUSCULAR | Status: AC | PRN

## 2013-12-25 MED ORDER — LACTATED RINGERS IV SOLN
INTRAVENOUS | Status: DC
  Administered 2013-12-25 (×2): via INTRAVENOUS

## 2013-12-25 MED ORDER — ONDANSETRON HCL 4 MG/2ML IJ SOLN
INTRAMUSCULAR | Status: DC | PRN
  Administered 2013-12-25: 4 mg via INTRAVENOUS

## 2013-12-25 MED ORDER — LACTATED RINGERS IR SOLN
Status: DC | PRN
  Administered 2013-12-25: 1

## 2013-12-25 MED ORDER — PROPOFOL INFUSION 10 MG/ML OPTIME
INTRAVENOUS | Status: DC | PRN
  Administered 2013-12-25: 200 ug/kg/min via INTRAVENOUS

## 2013-12-25 MED ORDER — FENTANYL CITRATE 0.05 MG/ML IJ SOLN
INTRAMUSCULAR | Status: DC | PRN
  Administered 2013-12-25: 100 ug via INTRAVENOUS

## 2013-12-25 MED ORDER — LIDOCAINE HCL 1 % IJ SOLN
INTRAMUSCULAR | Status: DC | PRN
  Administered 2013-12-25: 10 mL

## 2013-12-25 MED ORDER — ONDANSETRON HCL 4 MG/2ML IJ SOLN
INTRAMUSCULAR | Status: AC
  Filled 2013-12-25: qty 2

## 2013-12-25 MED ORDER — DEXAMETHASONE SODIUM PHOSPHATE 10 MG/ML IJ SOLN
INTRAMUSCULAR | Status: AC
  Filled 2013-12-25: qty 1

## 2013-12-25 MED ORDER — MIDAZOLAM HCL 5 MG/5ML IJ SOLN
INTRAMUSCULAR | Status: DC | PRN
  Administered 2013-12-25: 2 mg via INTRAVENOUS

## 2013-12-25 MED ORDER — FENTANYL CITRATE 0.05 MG/ML IJ SOLN
INTRAMUSCULAR | Status: AC
  Filled 2013-12-25: qty 2

## 2013-12-25 MED ORDER — LIDOCAINE HCL (CARDIAC) 20 MG/ML IV SOLN
INTRAVENOUS | Status: DC | PRN
  Administered 2013-12-25: 50 mg via INTRAVENOUS

## 2013-12-25 SURGICAL SUPPLY — 19 items
ABLATOR ENDOMETRIAL BIPOLAR (ABLATOR) IMPLANT
CANISTER SUCT 3000ML (MISCELLANEOUS) ×2 IMPLANT
CATH ROBINSON RED A/P 16FR (CATHETERS) ×2 IMPLANT
CLOTH BEACON ORANGE TIMEOUT ST (SAFETY) ×2 IMPLANT
CONTAINER PREFILL 10% NBF 60ML (FORM) ×4 IMPLANT
DRAPE HYSTEROSCOPY (DRAPE) ×2 IMPLANT
ELECT REM PT RETURN 9FT ADLT (ELECTROSURGICAL)
ELECTRODE REM PT RTRN 9FT ADLT (ELECTROSURGICAL) IMPLANT
GLOVE BIO SURGEON STRL SZ7.5 (GLOVE) ×2 IMPLANT
GLOVE BIOGEL PI IND STRL 7.5 (GLOVE) ×1 IMPLANT
GLOVE BIOGEL PI INDICATOR 7.5 (GLOVE) ×1
GOWN STRL REUS W/TWL LRG LVL3 (GOWN DISPOSABLE) ×4 IMPLANT
LOOP ANGLED CUTTING 22FR (CUTTING LOOP) IMPLANT
PACK VAGINAL MINOR WOMEN LF (CUSTOM PROCEDURE TRAY) ×2 IMPLANT
PAD OB MATERNITY 4.3X12.25 (PERSONAL CARE ITEMS) ×2 IMPLANT
SET TUBING HYSTEROSCOPY 2 NDL (TUBING) IMPLANT
TOWEL OR 17X24 6PK STRL BLUE (TOWEL DISPOSABLE) ×4 IMPLANT
TUBE HYSTEROSCOPY W Y-CONNECT (TUBING) IMPLANT
WATER STERILE IRR 1000ML POUR (IV SOLUTION) ×2 IMPLANT

## 2013-12-25 NOTE — Discharge Instructions (Addendum)
DISCHARGE INSTRUCTIONS: D&C / D&E The following instructions have been prepared to help you care for yourself upon your return home.   Personal hygiene:  Use sanitary pads for vaginal drainage, not tampons.  Shower the day after your procedure.  NO tub baths, pools or Jacuzzis for 2-3 weeks.  Wipe front to back after using the bathroom.  Activity and limitations:  Do NOT drive or operate any equipment for 24 hours. The effects of anesthesia are still present and drowsiness may result.  Do NOT rest in bed all day.  Walking is encouraged.  Walk up and down stairs slowly.  You may resume your normal activity in one to two days or as indicated by your physician.  Sexual activity: NO intercourse for at least 2 weeks after the procedure, or as indicated by your physician.  Diet: Eat a light meal as desired this evening. You may resume your usual diet tomorrow.  Return to work: You may resume your work activities in one to two days or as indicated by your doctor.  What to expect after your surgery: Expect to have vaginal bleeding/discharge for 2-3 days and spotting for up to 10 days. It is not unusual to have soreness for up to 1-2 weeks. You may have a slight burning sensation when you urinate for the first day. Mild cramps may continue for a couple of days. You may have a regular period in 2-6 weeks.  Call your doctor for any of the following:  Excessive vaginal bleeding, saturating and changing one pad every hour.  Inability to urinate 6 hours after discharge from hospital.  Pain not relieved by pain medication.  Fever of 100.4 F or greater.  Unusual vaginal discharge or odor.   Call for an appointment:    Patients signature: ______________________  Nurses signature ________________________  Support person's signature_______________________    DO NOT TAKE ANY IBUPROFEN PRODUCTS UNTIL 5 PM THIS EVENING.  YOU RECEIVED A SIMILAR MEDICATION AT 1100 AM TODAY IN THE  OPERATING ROOM.

## 2013-12-25 NOTE — Anesthesia Preprocedure Evaluation (Signed)
Anesthesia Evaluation  Patient identified by MRN, date of birth, ID band Patient awake    Reviewed: Allergy & Precautions, H&P , NPO status , Patient's Chart, lab work & pertinent test results, reviewed documented beta blocker date and time   History of Anesthesia Complications (+) PONV and history of anesthetic complications  Airway Mallampati: II TM Distance: >3 FB Neck ROM: Full    Dental  (+) Upper Dentures, Partial Lower   Pulmonary neg pulmonary ROS,  breath sounds clear to auscultation  Pulmonary exam normal       Cardiovascular hypertension, Pt. on medications and Pt. on home beta blockers + dysrhythmias Rhythm:Regular Rate:Normal     Neuro/Psych PSYCHIATRIC DISORDERS Depression negative neurological ROS     GI/Hepatic Neg liver ROS, GERD-  Medicated and Controlled,  Endo/Other  Morbid obesityHyperlipidemia  Renal/GU negative Renal ROS  negative genitourinary   Musculoskeletal negative musculoskeletal ROS (+)   Abdominal (+) + obese,   Peds  Hematology negative hematology ROS (+)   Anesthesia Other Findings   Reproductive/Obstetrics Post menopausal bleeding                           Anesthesia Physical Anesthesia Plan  ASA: III  Anesthesia Plan: MAC   Post-op Pain Management:    Induction: Intravenous  Airway Management Planned: Natural Airway  Additional Equipment:   Intra-op Plan:   Post-operative Plan: Extubation in OR  Informed Consent: I have reviewed the patients History and Physical, chart, labs and discussed the procedure including the risks, benefits and alternatives for the proposed anesthesia with the patient or authorized representative who has indicated his/her understanding and acceptance.   Dental advisory given  Plan Discussed with: Anesthesiologist, CRNA and Surgeon  Anesthesia Plan Comments:         Anesthesia Quick Evaluation

## 2013-12-25 NOTE — Transfer of Care (Signed)
Immediate Anesthesia Transfer of Care Note  Patient: Donna Barron  Procedure(s) Performed: Procedure(s): DILATATION AND CURETTAGE /HYSTEROSCOPY (N/A)  Patient Location: PACU  Anesthesia Type:MAC  Level of Consciousness: sedated  Airway & Oxygen Therapy: Patient Spontanous Breathing and Patient connected to nasal cannula oxygen  Post-op Assessment: Report given to PACU RN and Post -op Vital signs reviewed and stable  Post vital signs: stable  Complications: No apparent anesthesia complications

## 2013-12-25 NOTE — Op Note (Signed)
NAMEGIORGIA, Donna Barron NO.:  [id number]  MEDICAL RECORD NO.:  [id number]  LOCATION:  [facility]                          FACILITY:  [facility]  PHYSICIAN:  Gus Height, M.D.       DATE OF BIRTH:  [birth year]  DATE OF PROCEDURE:  12/25/2013 DATE OF DISCHARGE:  12/25/2013                              OPERATIVE REPORT   PREOPERATIVE DIAGNOSIS:  Postmenopausal bleeding with thickened endometrial stripe.  POSTOPERATIVE DIAGNOSIS:  Postmenopausal bleeding with thickened endometrial stripe.  PROCEDURE:  Cervical dilatation hysteroscopy uterine curettage.  SURGEON:  Gus Height, M.D.  ANESTHESIA:  IV sedation via MAC.  COMPLICATIONS:  None.  JUSTIFICATION:  The patient is a 73 year old white female with history of postmenopausal bleeding for several months.  Evaluation with endovaginal ultrasound revealed the patient had an 8 mm stripe and due to the thickness of the endometrial stripe, it was felt that endometrial sampling was indicated.  Attempted office endometrial biopsy were unsuccessful because of cervical stenosis and the patient is now being brought to the operating room.  At this time to undergo D and C, and hysteroscopy to rule out endometrial neoplasia.  PROCEDURE IN DETAIL:  The patient was taken to the operating room, placed in supine position.  An IV sedation was administered without difficulty.  She was placed in dorsal lithotomy position.  Prepped and draped in usual sterile fashion.  Bladder was catheterized.  Examination under anesthesia revealed normal external genitalia, Bartholin, Skene glands were within normal limits.  Vaginal vault revealed rectocele grade 2; cystocele grade 2.  The cervix was without lesion.  The uterus was normal size and shape.  The adnexa without any masses.  There was +2 uterine descensus with the cervix almost presenting to the introitus. At this point, a sound was placed and the small area of stenosis was passed without  difficulty and the uterus was sounded to 9 cm.  Once this was done, serial Kennon Rounds dilators were used to dilate the cervix further and this was done until a #25 Pratt dilator could be passed.  Once this was done, the diagnostic hysteroscope was advanced through the endocervical canal.  No endocervical lesions were noted.  The endometrial cavity was inspected other then areas of what appeared to be ecchymosis in the endometrial lining, probably consistent with bleeding from atrophy and no other lesions were noted.  There were no polyps or submucous myomas were noted.  The hysteroscope was then removed and sharp vigorous curettage was carried out using a medium-size curette. Only a small amount of tissue was obtained again consistent with actual diagnosis of atrophic endometritis.  Small amount of tissue was sent for histologic study.  At this point, the instruments were removed.  The cervix was injected with 10 mL of 1% Xylocaine for postop analgesia. The patient was reversed from the IV sedation, brought to recovery in satisfactory condition.  Estimated blood loss was minimal.  Deficit was 170 mL from the hysteroscopy.  Plan is for the patient to be discharged home.  She is to call for pathology report on Friday.  She is to call for any problems such as fever, pain, or heavy bleeding.  She  will be seen back in 3-4 weeks for followup examination with Dr. Gertie Fey.     Gus Height, M.D.     AR/MEDQ  D:  12/25/2013  T:  12/25/2013  Job:  [id number]

## 2013-12-25 NOTE — H&P (Signed)
  Status unchanged will proceed with planned D&C and hysteroscopy.

## 2013-12-25 NOTE — Anesthesia Postprocedure Evaluation (Addendum)
  Anesthesia Post-op Note  Patient: Donna Barron  Procedure(s) Performed: Procedure(s): DILATATION AND CURETTAGE /HYSTEROSCOPY (N/A)  Patient Location: PACU  Anesthesia Type:MAC  Level of Consciousness: awake, alert  and oriented  Airway and Oxygen Therapy: Patient Spontanous Breathing  Post-op Pain: none  Post-op Assessment: Post-op Vital signs reviewed, Patient's Cardiovascular Status Stable, Respiratory Function Stable, Patent Airway, No signs of Nausea or vomiting and Pain level controlled  Post-op Vital Signs: Reviewed and stable  Last Vitals:  Filed Vitals:   12/25/13 1056  BP: 125/72  Pulse: 85  Temp: 36.7 C  Resp: 25    Complications: No apparent anesthesia complications

## 2013-12-25 NOTE — Brief Op Note (Signed)
12/25/2013  10:51 AM  PATIENT:  Donna Barron  73 y.o. female  PRE-OPERATIVE DIAGNOSIS:  Post Menopausal Bleeding  POST-OPERATIVE DIAGNOSIS:  Post Menopausal Bleeding  PROCEDURE:  Procedure(s): DILATATION AND CURETTAGE /HYSTEROSCOPY (N/A)  SURGEON:  Surgeon(s) and Role:    * Gus Height, MD - Primary  ANESTHESIA:   IV sedation  EBL:  Total I/O In: 1000 [I.V.:1000] Out: 15 [Urine:10; Blood:5]  BLOOD ADMINISTERED:none  DRAINS: none   LOCAL MEDICATIONS USED:  LIDOCAINE   SPECIMEN:  Source of Specimen:  endometrial currettings  DISPOSITION OF SPECIMEN:  PATHOLOGY  COUNTS:  YES  TOURNIQUET:  * No tourniquets in log *  DICTATION: .Other Dictation: Dictation Number [id number]  PLAN OF CARE: Discharge to home after PACU  PATIENT DISPOSITION:  PACU - hemodynamically stable.   Delay start of Pharmacological VTE agent (>24hrs) due to surgical blood loss or risk of bleeding: not applicable

## 2013-12-26 ENCOUNTER — Encounter (HOSPITAL_COMMUNITY): Payer: Self-pay | Admitting: Obstetrics and Gynecology

## 2016-09-28 ENCOUNTER — Other Ambulatory Visit: Payer: Self-pay | Admitting: Family Medicine

## 2016-09-28 DIAGNOSIS — R5381 Other malaise: Secondary | ICD-10-CM

## 2016-09-28 DIAGNOSIS — Z1231 Encounter for screening mammogram for malignant neoplasm of breast: Secondary | ICD-10-CM

## 2016-09-29 ENCOUNTER — Other Ambulatory Visit (HOSPITAL_COMMUNITY): Payer: Self-pay | Admitting: Family Medicine

## 2016-09-29 ENCOUNTER — Other Ambulatory Visit: Payer: Self-pay | Admitting: Family Medicine

## 2016-09-29 DIAGNOSIS — E2839 Other primary ovarian failure: Secondary | ICD-10-CM

## 2016-09-29 DIAGNOSIS — Z780 Asymptomatic menopausal state: Secondary | ICD-10-CM

## 2016-10-06 ENCOUNTER — Ambulatory Visit (HOSPITAL_COMMUNITY)
Admission: RE | Admit: 2016-10-06 | Discharge: 2016-10-06 | Disposition: A | Discharge status: Home / Self Care | Payer: Medicare Other | Source: Ambulatory Visit | Attending: Family Medicine | Admitting: Family Medicine

## 2016-10-06 DIAGNOSIS — Z780 Asymptomatic menopausal state: Secondary | ICD-10-CM

## 2016-10-06 DIAGNOSIS — E2839 Other primary ovarian failure: Secondary | ICD-10-CM

## 2016-10-25 ENCOUNTER — Other Ambulatory Visit: Payer: Self-pay | Admitting: Obstetrics & Gynecology

## 2016-10-27 LAB — CYTOLOGY - PAP

## 2017-01-24 ENCOUNTER — Ambulatory Visit (INDEPENDENT_AMBULATORY_CARE_PROVIDER_SITE_OTHER): Payer: Medicare Other | Admitting: Orthopedic Surgery

## 2017-01-24 ENCOUNTER — Encounter: Payer: Self-pay | Admitting: Orthopedic Surgery

## 2017-01-24 VITALS — BP 140/76 | HR 73 | Resp 14 | Ht 62.0 in | Wt 214.0 lb

## 2017-01-24 DIAGNOSIS — M19031 Primary osteoarthritis, right wrist: Secondary | ICD-10-CM | POA: Diagnosis not present

## 2017-01-24 DIAGNOSIS — M654 Radial styloid tenosynovitis [de Quervain]: Secondary | ICD-10-CM

## 2017-01-24 DIAGNOSIS — S62524A Nondisplaced fracture of distal phalanx of right thumb, initial encounter for closed fracture: Secondary | ICD-10-CM | POA: Diagnosis not present

## 2017-01-24 NOTE — Patient Instructions (Addendum)
Where splint for 4 weeks  Take Tylenol or Advil for pain  Apply ice to the right thumb twice a day for 15 minutes

## 2017-01-24 NOTE — Progress Notes (Signed)
NEW PATIENT OFFICE VISIT    Chief Complaint  Patient presents with  . Hand Pain    right thumb has xrays from Dr Edrick Oh office from March 4876    76 year old female injured her right thumb 4 weeks ago. She was walking down a step and fell down landed on her right thumb with the thumb extended and injured her right thumb. X-ray showed a distal phalanx fracture but she's had continued pain actually in the [facility] joint as well as the metacarpophalangeal joint and wrist radiating up to the forearm  She did try some aerobic when it was first injured  She is now complaining of mild dull constant pain over the right thumb in the areas as described    Review of Systems  Skin: Negative for rash.  Neurological: Negative for tingling.     Past Medical History:  Diagnosis Date  . Depression   . Dysrhythmia    heart palipations  . GERD (gastroesophageal reflux disease)   . HTN (hypertension)   . Hypercholesteremia   . PONV (postoperative nausea and vomiting)     Past Surgical History:  Procedure Laterality Date  . APPENDECTOMY    . ARTHROSCOPIC REPAIR ACL  05/2008   Left knee  . CHOLECYSTECTOMY    . DILATION AND CURETTAGE OF UTERUS    . HYSTEROSCOPY W/D&C N/A 12/25/2013   Procedure: DILATATION AND CURETTAGE /HYSTEROSCOPY;  Surgeon: Gus Height, MD;  Location: [facility];  Service: Gynecology;  Laterality: N/A;  . TUBAL LIGATION      Family History  Problem Relation Age of Onset  . Heart failure Mother 55  . Lung cancer Father 30   Social History  Substance Use Topics  . Smoking status: Never Smoker  . Smokeless tobacco: Never Used  . Alcohol use Yes     Comment: rarely    BP 140/76   Pulse 73   Resp 14   Ht 5\' 2"  (1.575 m)   Wt 214 lb (97.1 kg)   BMI 39.14 kg/m   Physical Exam  Constitutional: She is oriented to person, place, and time. She appears well-developed and well-nourished.  Neurological: She is alert and oriented to person, place, and time.  Psychiatric:  She has a normal mood and affect.  Vitals reviewed.   Ortho Exam Normal gait  Right thumb evaluation she has tenderness in the distal phalanx as well as the carpometacarpal joint and the [facility] joint and the first extensor compartment  She maintains full range of motion  Collateral ligaments of the CMC joint are stable to stress testing at 0 and 30 flexion  Motor exam showed normal flexor tendon power  Skin was warm dry and intact  Sensation was normal  Radial pulse was normal capillary refill is normal  There is no enlargement of epitrochlear lymph nodes of the right elbow  Encounter Diagnoses  Name Primary?  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 Closed nondisplaced fracture of distal phalanx of right thumb, initial encounter     Meds ordered this encounter  Medications  . oxybutynin (DITROPAN-XL) 10 MG 24 hr tablet  . escitalopram (LEXAPRO) 20 MG tablet    Encounter Diagnoses  Name Primary?  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 Closed nondisplaced fracture of distal phalanx of right thumb, initial encounter  PLAN:   Recommend thumb splint for the next 4 weeks, ice couple times a day and over-the-counter Tylenol or Advil for pain

## 2017-02-22 ENCOUNTER — Ambulatory Visit (INDEPENDENT_AMBULATORY_CARE_PROVIDER_SITE_OTHER): Payer: Medicare Other | Admitting: Orthopedic Surgery

## 2017-02-22 DIAGNOSIS — M19031 Primary osteoarthritis, right wrist: Secondary | ICD-10-CM

## 2017-02-22 DIAGNOSIS — M654 Radial styloid tenosynovitis [de Quervain]: Secondary | ICD-10-CM

## 2017-02-22 DIAGNOSIS — S62524D Nondisplaced fracture of distal phalanx of right thumb, subsequent encounter for fracture with routine healing: Secondary | ICD-10-CM | POA: Diagnosis not present

## 2017-02-22 NOTE — Progress Notes (Signed)
Follow up   Encounter Diagnoses  Name Primary?  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 Closed nondisplaced fracture of distal phalanx of right thumb with routine healing, subsequent encounter    76 year old female who fractured the distal phalanx right thumb develop tendinitis and was treated with a thumb spica splint. She reports that she has decreased pain and improve motion.   Review of Systems  Constitutional: Negative for chills, fever and weight loss.  Respiratory: Negative for shortness of breath.   Cardiovascular: Negative for chest pain.  Neurological: Negative for tingling.    However, on examination she still has a significant amount of tenderness in the first extensor compartment although the Finkelstein's test is normal. She has some tenderness over the A1 pulley at the MTP joint on the volar aspect. Neurovascular exam is intact. Extension flexion DIP joint strength normal. Skin shows no discoloration.  Physical Exam  Constitutional: She is oriented to person, place, and time. She appears well-developed and well-nourished.  Neurological: She is alert and oriented to person, place, and time.  Skin: Skin is warm and dry. Capillary refill takes less than 2 seconds.    Plan continue splint for 3 weeks follow-up 3 weeks

## 2017-03-16 ENCOUNTER — Encounter: Payer: Self-pay | Admitting: Orthopedic Surgery

## 2017-03-16 ENCOUNTER — Ambulatory Visit (INDEPENDENT_AMBULATORY_CARE_PROVIDER_SITE_OTHER): Payer: Medicare Other | Admitting: Orthopedic Surgery

## 2017-03-16 VITALS — BP 107/73 | HR 78 | Resp 16 | Ht 62.0 in | Wt 214.0 lb

## 2017-03-16 DIAGNOSIS — M19031 Primary osteoarthritis, right wrist: Secondary | ICD-10-CM

## 2017-03-16 DIAGNOSIS — M654 Radial styloid tenosynovitis [de Quervain]: Secondary | ICD-10-CM

## 2017-03-16 DIAGNOSIS — S62524D Nondisplaced fracture of distal phalanx of right thumb, subsequent encounter for fracture with routine healing: Secondary | ICD-10-CM | POA: Diagnosis not present

## 2017-03-16 NOTE — Progress Notes (Signed)
Follow-up visit  Encounter Diagnoses  Name Primary?  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 Closed nondisplaced fracture of distal phalanx of right thumb with routine healing, subsequent encounter     Problem #1 the patient still having pain over the radial styloid the first extensor compartment right wrist splinting  Problem 2 pain seems joint of the right thumb  Problem #3 fracture has healed and is asymptomatic  Review of Systems  Neurological: Negative for tingling.   BP 107/73   Pulse 78   Resp 16   Ht 5\' 2"  (1.575 m)   Wt 214 lb (97.1 kg)   BMI 39.14 kg/m   Tenderness over the first extensor compartment CMC joint and right elbow. Positive wrist extension test for tennis elbow positive Wynn Maudlin test for de Quervain's positive grind test for [facility] arthritis thumb is stable elbow is stable muscle strength is normal good pinch strength skin is intact no rash pulses are good sensation is normal  Encounter Diagnoses  Name Primary?  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 Patient gave verbal consent timeout to confirm site of injection  Sterile technique ethyl chloride used for skin prep  No complications

## 2017-03-16 NOTE — Patient Instructions (Signed)

## 2017-03-23 ENCOUNTER — Ambulatory Visit (INDEPENDENT_AMBULATORY_CARE_PROVIDER_SITE_OTHER): Payer: Medicare Other | Admitting: Orthopedic Surgery

## 2017-03-23 ENCOUNTER — Encounter: Payer: Self-pay | Admitting: Orthopedic Surgery

## 2017-03-23 VITALS — BP 127/81 | HR 71 | Ht 62.0 in | Wt 212.0 lb

## 2017-03-23 DIAGNOSIS — M7711 Lateral epicondylitis, right elbow: Secondary | ICD-10-CM | POA: Diagnosis not present

## 2017-03-23 DIAGNOSIS — M654 Radial styloid tenosynovitis [de Quervain]: Secondary | ICD-10-CM

## 2017-03-23 NOTE — Progress Notes (Signed)
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 Injection helped her de Quervain's syndrome she still in the brace  Procedure note injection for right tennis elbow   Diagnosis right tennis elbow  Anesthesia ethyl chloride was used Alcohol use is clean the skin  After we obtained verbal consent and timeout a 25-gauge needle was used to inject 40 mg of Depo-Medrol and 3 cc of 1% lidocaine just distal to the insertion of the ECRB  There were no complications and a sterile bandage was applied.

## 2017-04-13 ENCOUNTER — Ambulatory Visit (INDEPENDENT_AMBULATORY_CARE_PROVIDER_SITE_OTHER): Payer: Medicare Other | Admitting: Orthopedic Surgery

## 2017-04-13 ENCOUNTER — Encounter: Payer: Self-pay | Admitting: Orthopedic Surgery

## 2017-04-13 VITALS — BP 130/78 | HR 70 | Ht 62.0 in | Wt 213.0 lb

## 2017-04-13 DIAGNOSIS — M7711 Lateral epicondylitis, right elbow: Secondary | ICD-10-CM

## 2017-04-13 DIAGNOSIS — M19031 Primary osteoarthritis, right wrist: Secondary | ICD-10-CM | POA: Diagnosis not present

## 2017-04-13 DIAGNOSIS — M654 Radial styloid tenosynovitis [de Quervain]: Secondary | ICD-10-CM

## 2017-04-13 NOTE — Patient Instructions (Signed)

## 2017-04-13 NOTE — Progress Notes (Signed)
  Progress Note   Patient ID: Donna Barron, female   DOB: 10-24-1940, 76 y.o.   MRN: [id number]  Chief Complaint  Patient presents with  . Elbow Pain    right  . Hand Pain    right    Patient with history of de Quervain's syndrome and tennis elbow status post injection in the elbow and the wrist.  Treatment is also included wrist splinting including the thumb.  Elbow has improved however she still having trouble with her.  She had difficulty holding her hair dryer this morning as well.  She does notice a difference when the splint is taken OFF           Review of Systems  Neurological: Negative for tingling and sensory change.   No outpatient prescriptions have been marked as taking for the 04/13/17 encounter (Appointment) with Carole Civil, MD.    Allergies  Allergen Reactions  . Avelox [Moxifloxacin Hcl In Nacl] Other (See Comments)    insomnia     BP 130/78   Pulse 70   Ht 5\' 2"  (1.575 m)   Wt 213 lb (96.6 kg)   BMI 38.96 kg/m   Physical Exam  Musculoskeletal:       Right hand: She exhibits tenderness and bony tenderness. She exhibits normal two-point discrimination, normal capillary refill, no deformity and no swelling. Decreased sensation is not present in the ulnar distribution, is not present in the medial distribution and is not present in the radial distribution. Decreased strength noted. She exhibits thumb/finger opposition. She exhibits no finger abduction and no wrist extension trouble.     Gen. appearance the patient's appearance is normal with normal grooming and  hygiene The patient is oriented to person place and time Mood and affect are normal   Ortho Exam  Physical Exam  Musculoskeletal:       Right hand: She exhibits tenderness and bony tenderness. She exhibits normal two-point discrimination, normal capillary refill, no deformity and no swelling. Decreased sensation is not present in the ulnar distribution, is not present in the  medial distribution and is not present in the radial distribution. Decreased strength noted. She exhibits thumb/finger opposition. She exhibits no finger abduction and no wrist extension trouble.    Medical decision-making Encounter Diagnoses  Name Primary?  Tennis Must Quervain's tenosynovitis, right Yes  . Right tennis elbow   . 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 This was well tolerated without any complications  The patient will continue splinting and follow-up in 6 weeks   Arther Abbott, MD 04/13/2017 9:12 AM

## 2017-05-24 ENCOUNTER — Ambulatory Visit: Payer: Medicare Other | Admitting: Orthopedic Surgery

## 2017-05-26 ENCOUNTER — Encounter: Payer: Self-pay | Admitting: Orthopedic Surgery

## 2017-06-22 ENCOUNTER — Ambulatory Visit (INDEPENDENT_AMBULATORY_CARE_PROVIDER_SITE_OTHER): Payer: Medicare Other | Admitting: Orthopedic Surgery

## 2017-06-22 DIAGNOSIS — M19031 Primary osteoarthritis, right wrist: Secondary | ICD-10-CM

## 2017-06-22 DIAGNOSIS — G5601 Carpal tunnel syndrome, right upper limb: Secondary | ICD-10-CM

## 2017-06-22 DIAGNOSIS — M654 Radial styloid tenosynovitis [de Quervain]: Secondary | ICD-10-CM | POA: Diagnosis not present

## 2017-06-22 NOTE — Progress Notes (Signed)
Progress Note   Patient ID: Donna Barron, female   DOB: 10-18-1940, 77 y.o.   MRN: [id number]  Chief complaint pain right wrist and numbness tingling right hand   Donna Barron presents with a interesting array of symptoms  Her initial trauma began with a fall injuring the tip of her right thumb however, she persisted with pain in the first extensor compartment pain at the base of the thumb at the [facility] joint and now presents with pain and paresthesias of the thumb index long and part of ring finger which is worse and exacerbated at night relieved by shaking her hand.  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  However she still has trouble reaching across her body putting on her deodorant and painful ulnar deviation.       Review of Systems  Constitutional: Negative for fever.  Respiratory: Negative for shortness of breath.   Cardiovascular: Negative for chest pain.  Skin: Negative.   Neurological: Positive for tingling and sensory change. Negative for tremors and focal weakness.   No outpatient medications have been marked as taking for the 06/22/17 encounter (Office Visit) with Carole Civil, MD.    Past Medical History:  Diagnosis Date  . Depression   . Dysrhythmia    heart palipations  . GERD (gastroesophageal reflux disease)   . HTN (hypertension)   . Hypercholesteremia   . PONV (postoperative nausea and vomiting)    Past Surgical History:  Procedure Laterality Date  . APPENDECTOMY    . ARTHROSCOPIC REPAIR ACL  05/2008   Left knee  . CHOLECYSTECTOMY    . DILATION AND CURETTAGE OF UTERUS    . HYSTEROSCOPY W/D&C N/A 12/25/2013   Procedure: DILATATION AND CURETTAGE /HYSTEROSCOPY;  Surgeon: Gus Height, MD;  Location: [facility];  Service: Gynecology;  Laterality: N/A;  . TUBAL LIGATION     Social History    Tobacco Use  . Smoking status: Never Smoker  . Smokeless tobacco: Never Used  Substance Use Topics  . Alcohol use: Yes    Comment: rarely  . Drug use: No     Allergies  Allergen Reactions  . Avelox [Moxifloxacin Hcl In Nacl] Other (See Comments)    insomnia  . Oxybutynin        Physical Exam She is well-developed well-nourished, grooming hygiene are normal.  She is awake alert and oriented x3 mood and affect are normal  Gait and station are normal   Ortho Exam  Left upper extremity primarily focusing on the hand.  Normal alignment in the hand no tenderness to palpation.  Grip strength is normal neurovascular exam is intact good color and capillary refill are noted.  Wrist joint is stable there is no dislocation subluxation of the joints of the fingers.  Range of motion is normal  We look at the right hand there is tenderness over the first extensor compartment.  There is no malalignment.  Range of motion normal  Stability tests are normal  Strength tests are normal except for pinch  She also has tenderness at the base of the thumb and over the carpal tunnel.  Carpal tunnel test are positive including a Phalen's test positive at 30 seconds and a carpal tunnel compression test positive almost immediately.  Her vascular exam is normal but the neurologic exam showed sensory changes of  the thumb index and long finger    Encounter Diagnoses  Name Primary?  Tennis Must Quervain's tenosynovitis, right Yes  . 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 Carpal tunnel syndrome of right wrist     We discussed the patient's options at this point she has had pain in the right arm for 1 year now.  Although the initial injury healed, the subsequent tendinopathy's and carpal tunnel I have given the patient's significant disability and she wishes to proceed with surgical treatment versus further nonoperative treatment.  I told her I do not do the surgery  necessary for the basilar joint arthritis and she would have to seek hand surgery consultation which we could arrange.  However, I am happy to release the carpal tunnel to relieve the numbness and tingling in the nocturnal symptoms as well as released the first extensor compartment to relieve the ulnar deviation symptoms.  She has been cautioned on the superficial radial nerve branch which must be moved and may be caused some numbness after surgery  She is happy to proceed with a right carpal tunnel release and right first extensor compartment release.   Arther Abbott, MD 06/22/2017 3:15 PM

## 2017-06-22 NOTE — Patient Instructions (Addendum)
Carpal Tunnel Syndrome Carpal tunnel syndrome is a condition that causes pain in your hand and arm. The carpal tunnel is a narrow area that is on the palm side of your wrist. Repeated wrist motion or certain diseases may cause swelling in the tunnel. This swelling can pinch the main nerve in the wrist (median nerve). Follow these instructions at home: If you have a splint:  Wear it as told by your doctor. Remove it only as told by your doctor.  Loosen the splint if your fingers: ? Become numb and tingle. ? Turn blue and cold.  Keep the splint clean and dry. General instructions  Take over-the-counter and prescription medicines only as told by your doctor.  Rest your wrist from any activity that may be causing your pain. If needed, talk to your employer about changes that can be made in your work, such as getting a wrist pad to use while typing.  If directed, apply ice to the painful area: ? Put ice in a plastic bag. ? Place a towel between your skin and the bag. ? Leave the ice on for 20 minutes, 2-3 times per day.  Keep all follow-up visits as told by your doctor. This is important.  Do any exercises as told by your doctor, physical therapist, or occupational therapist. Contact a doctor if:  You have new symptoms.  Medicine does not help your pain.  Your symptoms get worse. This information is not intended to replace advice given to you by your health care provider. Make sure you discuss any questions you have with your health care provider. Document Released: 05/20/2011 Document Revised: 11/06/2015 Document Reviewed: 10/16/2014 Elsevier Interactive Patient Education  2018 [place] Syndrome Carpal tunnel syndrome is a condition that causes pain in your hand and arm. The carpal tunnel is a narrow area that is on the palm side of your wrist. Repeated wrist motion or certain diseases may cause swelling in the tunnel. This swelling can pinch the main nerve in  the wrist (median nerve). Follow these instructions at home: If you have a splint:  Wear it as told by your doctor. Remove it only as told by your doctor.  Loosen the splint if your fingers: ? Become numb and tingle. ? Turn blue and cold.  Keep the splint clean and dry. General instructions  Take over-the-counter and prescription medicines only as told by your doctor.  Rest your wrist from any activity that may be causing your pain. If needed, talk to your employer about changes that can be made in your work, such as getting a wrist pad to use while typing.  If directed, apply ice to the painful area: ? Put ice in a plastic bag. ? Place a towel between your skin and the bag. ? Leave the ice on for 20 minutes, 2-3 times per day.  Keep all follow-up visits as told by your doctor. This is important.  Do any exercises as told by your doctor, physical therapist, or occupational therapist. Contact a doctor if:  You have new symptoms.  Medicine does not help your pain.  Your symptoms get worse. This information is not intended to replace advice given to you by your health care provider. Make sure you discuss any questions you have with your health care provider. Document Released: 05/20/2011 Document Revised: 11/06/2015 Document Reviewed: 10/16/2014 Elsevier Interactive Patient Education  2018 [place] Disease Alfonse Ras disease is inflammation of the tendon on the thumb side of  the wrist. Tendons are cords of tissue that connect bones to muscles. The tendons in your hand pass through a tunnel, or sheath. A slippery layer of tissue (synovium) lets the tendons move smoothly in the sheath. With de Quervain disease, the sheath swells or thickens, causing friction and pain. The condition is also called de Quervain tendinosis and de Quervain syndrome. It occurs most often in women who are 27-54 years old. What are the causes? The exact cause of de Quervain disease is not  known. It may result from:  Overusing your hands, especially with repetitive motions that involve twisting your hand or using a forceful grip.  Pregnancy.  Rheumatoid disease.  What increases the risk? You may have a greater risk for de Quervain disease if you:  Are a middle-aged woman.  Are pregnant.  Have rheumatoid arthritis.  Have diabetes.  Use your hands far more than normal, especially with a tight grip or excessive twisting.  What are the signs or symptoms? Pain on the thumb side of your wrist is the main symptom of de Quervain disease. Other signs and symptoms include:  Pain that gets worse when you grasp something or turn your wrist.  Pain that extends up the forearm.  Cysts in the area of the pain.  Swelling of your wrist and hand.  A sensation of snapping in the wrist.  Trouble moving the thumb and wrist.  How is this diagnosed? Your health care provider may diagnose de Quervain disease based on your signs and symptoms. A physical exam will also be done. A simple test Wynn Maudlin test) that involves pulling your thumb and wrist to see if this causes pain can help determine whether you have the condition. Sometimes you may need to have an X-ray. How is this treated? Avoiding any activity that causes pain and swelling is the best treatment. Other options include:  Wearing a splint.  Taking medicine. Anti-inflammatory medicines and corticosteroid injections may reduce inflammation and relieve pain.  Having surgery if other treatments do not work.  Follow these instructions at home:  Using ice can be helpful after doing activities that involve the sore wrist. To apply ice to the injured area: ? Put ice in a plastic bag. ? Place a towel between your skin and the bag. ? Leave the ice on for 20 minutes, 2-3 times a day.  Take medicines only as directed by your health care provider.  Wear your splint as directed. This will allow your hand to rest and  heal. Contact a health care provider if:  Your pain medicine does not help.  Your pain gets worse.  You develop new symptoms. This information is not intended to replace advice given to you by your health care provider. Make sure you discuss any questions you have with your health care provider. Document Released: 02/23/2001 Document Revised: 11/06/2015 Document Reviewed: 10/03/2013 Elsevier Interactive Patient Education  [employer].

## 2017-06-24 ENCOUNTER — Telehealth: Payer: Self-pay | Admitting: Radiology

## 2017-06-24 NOTE — Telephone Encounter (Signed)
I called patient about surgery date. Plan for Jan 31st, she states this is fine.  She will expect call from [place] at [facility] with preop app

## 2017-06-24 NOTE — Addendum Note (Signed)
Addended by: Arther Abbott E on: 06/24/2017 11:53 AM   Modules accepted: Orders, SmartSet

## 2017-06-28 ENCOUNTER — Encounter: Payer: Self-pay | Admitting: Radiology

## 2017-07-08 NOTE — Patient Instructions (Signed)
Donna Barron  07/08/2017     @PREFPERIOPPHARMACY @   Your procedure is scheduled on  07/13/2017   Report to [facility] at  615  A.M.  Call this number if you have problems the morning of surgery:  [phone]   Remember:  Do not eat food or drink liquids after midnight.  Take these medicines the morning of surgery with A SIP OF WATER  Lexapro, toprol XL, protonix.   Do not wear jewelry, make-up or nail polish.  Do not wear lotions, powders, or perfumes, or deodorant.  Do not shave 48 hours prior to surgery.  Men may shave face and neck.  Do not bring valuables to the hospital.  [facility] is not responsible for any belongings or valuables.  Contacts, dentures or bridgework may not be worn into surgery.  Leave your suitcase in the car.  After surgery it may be brought to your room.  For patients admitted to the hospital, discharge time will be determined by your treatment team.  Patients discharged the day of surgery will not be allowed to drive home.   Name and phone number of your driver:   family Special instructions:  None  Please read over the following fact sheets that you were given. Anesthesia Post-op Instructions and Care and Recovery After Surgery      Fasciotomy for Compartment Syndrome Fasciotomy for compartment syndrome is a surgical procedure that is performed to quickly relieve pressure around a muscle and restore blood flow to the area. Muscles are surrounded by a thin but tough layer of tissue (fascia). If your muscles begin to swell inside the fascia, blood flow to the muscles can be cut off, and your muscles may die if the pressure is not relieved. This condition is called compartment syndrome. Trauma, such as a crushing injury or a complicated broken bone, is the most common cause. The most common areas to have compartment syndrome are the leg below the knee and the arm below the elbow. Fasciotomy for compartment syndrome is usually  performed as an urgent procedure. It is important to have the procedure as soon as possible to help prevent muscle death. Tell a health care provider about:  Any allergies you have.  All medicines you are taking, including vitamins, herbs, eye drops, creams, and over-the-counter medicines.  Any problems you or family members have had with anesthetic medicines.  Any blood disorders you have.  Any surgeries you have had.  Any medical conditions you have.  Whether you are pregnant or may be pregnant.  Any tobacco use. What are the risks? Generally, this is a safe procedure. However, problems may occur, including:  Bleeding.  Scarring.  Muscle weakness or loss of the muscle.  Pain.  Nerve damage that causes loss of feeling.  Infection.  Need for repeated procedures.  What happens during the procedure? The procedure may vary among health care providers and hospitals. Your exact procedure will depend on where your compartment syndrome is located. In general, the following occurs during this procedure:  An IV tube will be inserted into one of your veins.  You will be given a medicine to make you fall asleep (general anesthetic).  You may be given antibiotics to help prevent infection.  Your surgeon will make one or more cuts (incisions) in the area where the compartment syndrome is located.  Your incisions may not be closed during surgery. Instead, they may be covered  with bandages (dressings) until the pressure in the area decreases.  What happens after the procedure?  Your blood pressure, heart rate, breathing rate, and blood oxygen level will be monitored often until the medicines you were given have worn off.  You will be given medicine for pain.  You may continue to receive fluids and antibiotics through the IV.  Your incision will be checked and your dressing will be changed regularly.  You will be re-evaluated in the operating room in the next few days. At  this time, further treatments or wound closurewill be performed. This may include stitches (sutures) and a procedure to cover an area of damaged or missing skin with a piece of healthy skin (skin graft). This information is not intended to replace advice given to you by your health care provider. Make sure you discuss any questions you have with your health care provider. Document Released: 08/27/2008 Document Revised: 11/06/2015 Document Reviewed: 10/14/2014 Elsevier Interactive Patient Education  2018 [place] for Compartment Syndrome, Care After Refer to this sheet in the next few weeks. These instructions provide you with information about caring for yourself after your procedure. Your health care provider may also give you more specific instructions. Your treatment has been planned according to current medical practices, but problems sometimes occur. Call your health care provider if you have any problems or questions after your procedure. What can I expect after the procedure? After the procedure, it is common to have:  Pain.  Itching.  Stiffness.  Numbness or tingling.  Clear or yellow fluid draining from the incision.  Follow these instructions at home: Incision care  Follow instructions from your surgeon about: ? How to take care of your incision. ? When and how you should change your bandage (dressing). If your incision is open, a wound care specialist may come to your home to change the dressing. ? When you should remove your dressing.  Keep the incision area dry. Do not take baths, swim, or use a hot tub until your surgeon approves. Ask your surgeon when you can take a shower.  Check your incision every day for signs of infection. Watch for: ? Redness, swelling, or pain. ? Pus or blood. ? Fluid drainage that continues for longer than one week. Managing pain, stiffness, and swelling  Move your fingers or toes often to avoid stiffness and  swelling.  Raise (elevate) the injured area above the level of your heart as much as possible. Activity  Ask your surgeon what activities are safe for you.  If the procedure was done on your leg or foot, do not stand or walk until your surgeon says you may.  If the procedure was done on your hand or arm, avoid any activity that involves lifting, carrying, gripping, or twisting until your surgeon says you may. General instructions  Take over-the-counter and prescription medicines only as told by your surgeon. Do not take aspirin or other NSAIDs unless your surgeon has approved.These types of medicine increase your risk of bleeding.  Keep all follow-up visits as told by your surgeon. This is important.  You may need to see a physical therapist or occupational therapist. This will help your recovery. Do any exercises as instructed by your physical or occupational therapist.  Do not use any tobacco products, including cigarettes, chewing tobacco, or e-cigarettes. Tobacco can delay healing. If you need help quitting, ask your health care provider. Contact a health care provider if:   You have a fever.  Your pain is not controlled with medicine.  Your incision becomes red, swollen, or painful.  You have fluid, including blood or pus, coming from your incision for longer than one week.  You feel nauseous or vomit and it does not go away. Get help right away if:  You have increased swelling in your arm or leg.  You have increasing or severe pain.  You have chest pain.  You have trouble breathing.  You lose feeling in your arm or leg. This information is not intended to replace advice given to you by your health care provider. Make sure you discuss any questions you have with your health care provider. Document Released: 08/27/2008 Document Revised: 11/06/2015 Document Reviewed: 10/14/2014 Elsevier Interactive Patient Education  2018 [place] Release Carpal  tunnel release is a surgical procedure to relieve numbness and pain in your hand that are caused by carpal tunnel syndrome. Your carpal tunnel is a narrow, hollow space in your wrist. It passes between your wrist bones and a band of connective tissue (transverse carpal ligament). The nerve that supplies most of your hand (median nerve) passes through this space, and so do the connections between your fingers and the muscles of your arm (tendons). Carpal tunnel syndrome makes this space swell and become narrow, and this causes pain and numbness. In carpal tunnel release surgery, a surgeon cuts through the transverse carpal ligament to make more room in the carpal tunnel space. You may have this surgery if other types of treatment have not worked. Tell a health care provider about:  Any allergies you have.  All medicines you are taking, including vitamins, herbs, eye drops, creams, and over-the-counter medicines.  Any problems you or family members have had with anesthetic medicines.  Any blood disorders you have.  Any surgeries you have had.  Any medical conditions you have. What are the risks? Generally, this is a safe procedure. However, problems may occur, including:  Bleeding.  Infection.  Injury to the median nerve.  Need for additional surgery.  What happens before the procedure?  Ask your health care provider about: ? Changing or stopping your regular medicines. This is especially important if you are taking diabetes medicines or blood thinners. ? Taking medicines such as aspirin and ibuprofen. These medicines can thin your blood. Do not take these medicines before your procedure if your health care provider instructs you not to.  Do not eat or drink anything after midnight on the night before the procedure or as directed by your health care provider.  Plan to have someone take you home after the procedure. What happens during the procedure?  An IV tube may be inserted into  a vein.  You will be given one of the following: ? A medicine that numbs the wrist area (local anesthetic). You may also be given a medicine to make you relax (sedative). ? A medicine that makes you go to sleep (general anesthetic).  Your arm, hand, and wrist will be cleaned with a germ-killing solution (antiseptic).  Your surgeon will make a surgical cut (incision) over the palm side of your wrist. The surgeon will pull aside the skin of your wrist to expose the carpal tunnel space.  The surgeon will cut the transverse carpal ligament.  The edges of the incision will be closed with stitches (sutures) or staples.  A bandage (dressing) will be placed over your wrist and wrapped around your hand and wrist. What happens after the procedure?  You may  spend some time in a recovery area.  Your blood pressure, heart rate, breathing rate, and blood oxygen level will be monitored often until the medicines you were given have worn off.  You will likely have some pain. You will be given pain medicine.  You may need to wear a splint or a wrist brace over your dressing. This information is not intended to replace advice given to you by your health care provider. Make sure you discuss any questions you have with your health care provider. Document Released: 08/21/2003 Document Revised: 11/06/2015 Document Reviewed: 01/16/2014 Elsevier Interactive Patient Education  2018 [place] After Refer to this sheet in the next few weeks. These instructions provide you with information about caring for yourself after your procedure. Your health care provider may also give you more specific instructions. Your treatment has been planned according to current medical practices, but problems sometimes occur. Call your health care provider if you have any problems or questions after your procedure. What can I expect after the procedure? After your procedure, it is typical to have the  following:  Pain.  Numbness.  Tingling.  Swelling.  Stiffness.  Bruising.  Follow these instructions at home:  Take medicines only as directed by your health care provider.  There are many different ways to close and cover an incision, including stitches (sutures), skin glue, and adhesive strips. Follow your health care provider's instructions about: ? Incision care. ? Bandage (dressing) changes and removal. ? Incision closure removal.  Wear a splint or a brace as directed by your surgeon. You may need to do this for 2-3 weeks.  Keep your hand raised (elevated) above the level of your heart while you are resting. Move your fingers often.  Avoid activities that cause hand pain.  Ask your surgeon when you can start to do all of your usual activities again, such as: ? Driving. ? Returning to work. ? Bathing and swimming.  Keep all follow-up visits as directed by your health care provider. This is important. You may need physical therapy for several months to speed healing and regain movement. Contact a health care provider if:  You have drainage, redness, swelling, or pain at your incision site.  You have a fever.  You have chills.  Your pain medicine is not working.  Your symptoms do not go away after 2 months.  Your symptoms go away and then return. Get help right away if:  You have pain or numbness that is getting worse.  Your fingers change color.  You are not able to move your fingers. This information is not intended to replace advice given to you by your health care provider. Make sure you discuss any questions you have with your health care provider. Document Released: 12/18/2004 Document Revised: 11/06/2015 Document Reviewed: 01/16/2014 Elsevier Interactive Patient Education  2018 [place] Anesthesia is a term that refers to techniques, procedures, and medicines that help a person stay safe and comfortable during a  medical procedure. Monitored anesthesia care, or sedation, is one type of anesthesia. Your anesthesia specialist may recommend sedation if you will be having a procedure that does not require you to be unconscious, such as:  Cataract surgery.  A dental procedure.  A biopsy.  A colonoscopy.  During the procedure, you may receive a medicine to help you relax (sedative). There are three levels of sedation:  Mild sedation. At this level, you may feel awake and relaxed. You will  be able to follow directions.  Moderate sedation. At this level, you will be sleepy. You may not remember the procedure.  Deep sedation. At this level, you will be asleep. You will not remember the procedure.  The more medicine you are given, the deeper your level of sedation will be. Depending on how you respond to the procedure, the anesthesia specialist may change your level of sedation or the type of anesthesia to fit your needs. An anesthesia specialist will monitor you closely during the procedure. Let your health care provider know about:  Any allergies you have.  All medicines you are taking, including vitamins, herbs, eye drops, creams, and over-the-counter medicines.  Any use of steroids (by mouth or as a cream).  Any problems you or family members have had with sedatives and anesthetic medicines.  Any blood disorders you have.  Any surgeries you have had.  Any medical conditions you have, such as sleep apnea.  Whether you are pregnant or may be pregnant.  Any use of cigarettes, alcohol, or street drugs. What are the risks? Generally, this is a safe procedure. However, problems may occur, including:  Getting too much medicine (oversedation).  Nausea.  Allergic reaction to medicines.  Trouble breathing. If this happens, a breathing tube may be used to help with breathing. It will be removed when you are awake and breathing on your own.  Heart trouble.  Lung trouble.  Before the  procedure Staying hydrated Follow instructions from your health care provider about hydration, which may include:  Up to 2 hours before the procedure - you may continue to drink clear liquids, such as water, clear fruit juice, black coffee, and plain tea.  Eating and drinking restrictions Follow instructions from your health care provider about eating and drinking, which may include:  8 hours before the procedure - stop eating heavy meals or foods such as meat, fried foods, or fatty foods.  6 hours before the procedure - stop eating light meals or foods, such as toast or cereal.  6 hours before the procedure - stop drinking milk or drinks that contain milk.  2 hours before the procedure - stop drinking clear liquids.  Medicines Ask your health care provider about:  Changing or stopping your regular medicines. This is especially important if you are taking diabetes medicines or blood thinners.  Taking medicines such as aspirin and ibuprofen. These medicines can thin your blood. Do not take these medicines before your procedure if your health care provider instructs you not to.  Tests and exams  You will have a physical exam.  You may have blood tests done to show: ? How well your kidneys and liver are working. ? How well your blood can clot.  General instructions  Plan to have someone take you home from the hospital or clinic.  If you will be going home right after the procedure, plan to have someone with you for 24 hours.  What happens during the procedure?  Your blood pressure, heart rate, breathing, level of pain and overall condition will be monitored.  An IV tube will be inserted into one of your veins.  Your anesthesia specialist will give you medicines as needed to keep you comfortable during the procedure. This may mean changing the level of sedation.  The procedure will be performed. After the procedure  Your blood pressure, heart rate, breathing rate, and  blood oxygen level will be monitored until the medicines you were given have worn off.  Do not  drive for 24 hours if you received a sedative.  You may: ? Feel sleepy, clumsy, or nauseous. ? Feel forgetful about what happened after the procedure. ? Have a sore throat if you had a breathing tube during the procedure. ? Vomit. This information is not intended to replace advice given to you by your health care provider. Make sure you discuss any questions you have with your health care provider. Document Released: 02/24/2005 Document Revised: 11/07/2015 Document Reviewed: 09/21/2015 Elsevier Interactive Patient Education  2018 [place], Care After These instructions provide you with information about caring for yourself after your procedure. Your health care provider may also give you more specific instructions. Your treatment has been planned according to current medical practices, but problems sometimes occur. Call your health care provider if you have any problems or questions after your procedure. What can I expect after the procedure? After your procedure, it is common to:  Feel sleepy for several hours.  Feel clumsy and have poor balance for several hours.  Feel forgetful about what happened after the procedure.  Have poor judgment for several hours.  Feel nauseous or vomit.  Have a sore throat if you had a breathing tube during the procedure.  Follow these instructions at home: For at least 24 hours after the procedure:   Do not: ? Participate in activities in which you could fall or become injured. ? Drive. ? Use heavy machinery. ? Drink alcohol. ? Take sleeping pills or medicines that cause drowsiness. ? Make important decisions or sign legal documents. ? Take care of children on your own.  Rest. Eating and drinking  Follow the diet that is recommended by your health care provider.  If you vomit, drink water, juice, or soup when you  can drink without vomiting.  Make sure you have little or no nausea before eating solid foods. General instructions  Have a responsible adult stay with you until you are awake and alert.  Take over-the-counter and prescription medicines only as told by your health care provider.  If you smoke, do not smoke without supervision.  Keep all follow-up visits as told by your health care provider. This is important. Contact a health care provider if:  You keep feeling nauseous or you keep vomiting.  You feel light-headed.  You develop a rash.  You have a fever. Get help right away if:  You have trouble breathing. This information is not intended to replace advice given to you by your health care provider. Make sure you discuss any questions you have with your health care provider. Document Released: 09/21/2015 Document Revised: 01/21/2016 Document Reviewed: 09/21/2015 Elsevier Interactive Patient Education  [employer].

## 2017-07-11 ENCOUNTER — Encounter (HOSPITAL_COMMUNITY)
Admission: RE | Admit: 2017-07-11 | Discharge: 2017-07-11 | Disposition: A | Discharge status: Home / Self Care | Payer: Medicare Other | Source: Ambulatory Visit | Attending: Orthopedic Surgery | Admitting: Orthopedic Surgery

## 2017-07-11 ENCOUNTER — Other Ambulatory Visit: Payer: Self-pay

## 2017-07-11 ENCOUNTER — Encounter (HOSPITAL_COMMUNITY): Payer: Self-pay

## 2017-07-11 DIAGNOSIS — M654 Radial styloid tenosynovitis [de Quervain]: Secondary | ICD-10-CM | POA: Diagnosis present

## 2017-07-11 DIAGNOSIS — Z888 Allergy status to other drugs, medicaments and biological substances status: Secondary | ICD-10-CM | POA: Diagnosis not present

## 2017-07-11 DIAGNOSIS — G5601 Carpal tunnel syndrome, right upper limb: Secondary | ICD-10-CM | POA: Diagnosis present

## 2017-07-11 DIAGNOSIS — Z881 Allergy status to other antibiotic agents status: Secondary | ICD-10-CM | POA: Diagnosis not present

## 2017-07-13 NOTE — H&P (Signed)
Progress Note    Patient ID: Donna Barron, female   DOB: 01/21/1941, 77 y.o.   MRN: [id number]   Chief complaint pain right wrist and numbness tingling right hand     Donna Barron presents with a interesting array of symptoms   Her initial trauma began with a fall injuring the tip of her right thumb however, she persisted with pain in the first extensor compartment pain at the base of the thumb at the [facility] joint and now presents with pain and paresthesias of the thumb index long and part of ring finger which is worse and exacerbated at night relieved by shaking her hand.   She is already been in a splint for the de Quervain's disease and for the thumb arthritis which did not help her carpal tunnel symptoms   She has had injection the first extensor compartment with partial relief.   However she still has trouble reaching across her body putting on her deodorant and painful ulnar deviation.             Review of Systems  Constitutional: Negative for fever.  Respiratory: Negative for shortness of breath.   Cardiovascular: Negative for chest pain.  Skin: Negative.   Neurological: Positive for tingling and sensory change. Negative for tremors and focal weakness.    Active Medications  No outpatient medications have been marked as taking for the 06/22/17 encounter (Office Visit) with Carole Civil, MD.            Past Medical History:  Diagnosis Date  . Depression    . Dysrhythmia      heart palipations  . GERD (gastroesophageal reflux disease)    . HTN (hypertension)    . Hypercholesteremia    . PONV (postoperative nausea and vomiting)           Past Surgical History:  Procedure Laterality Date  . APPENDECTOMY      . ARTHROSCOPIC REPAIR ACL   05/2008    Left knee  . CHOLECYSTECTOMY      . DILATION AND CURETTAGE OF UTERUS      . HYSTEROSCOPY W/D&C N/A 12/25/2013    Procedure: DILATATION AND CURETTAGE /HYSTEROSCOPY;  Surgeon: Gus Height, MD;  Location: [facility];  Service:  Gynecology;  Laterality: N/A;  . TUBAL LIGATION        Social History         Tobacco Use  . Smoking status: Never Smoker  . Smokeless tobacco: Never Used  Substance Use Topics  . Alcohol use: Yes      Comment: rarely  . Drug use: No             Allergies  Allergen Reactions  . Avelox [Moxifloxacin Hcl In Nacl] Other (See Comments)      insomnia  . Oxybutynin              Physical Exam She is well-developed well-nourished, grooming hygiene are normal.  She is awake alert and oriented x3 mood and affect are normal   Gait and station are normal     Ortho Exam   Left upper extremity primarily focusing on the hand.  Normal alignment in the hand no tenderness to palpation.  Grip strength is normal neurovascular exam is intact good color and capillary refill are noted.  Wrist joint is stable there is no dislocation subluxation of the joints of the fingers.  Range of motion is normal   We look at the right hand there is tenderness over  the first extensor compartment.  There is no malalignment.   Range of motion normal   Stability tests are normal   Strength tests are normal except for pinch   She also has tenderness at the base of the thumb and over the carpal tunnel.  Carpal tunnel test are positive including a Phalen's test positive at 30 seconds and a carpal tunnel compression test positive almost immediately.  Her vascular exam is normal but the neurologic exam showed sensory changes of the thumb index and long finger           Encounter Diagnoses  Name Primary?  Tennis Must Quervain's tenosynovitis, right Yes  . 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 Carpal tunnel syndrome of right wrist        We discussed the patient's options at this point she has had pain in the right arm for 1 year now.  Although the initial injury healed, the subsequent tendinopathy's and carpal tunnel I have given the patient's significant disability and  she wishes to proceed with surgical treatment versus further nonoperative treatment.   I told her I do not do the surgery necessary for the basilar joint arthritis and she would have to seek hand surgery consultation which we could arrange.   However, I am happy to release the carpal tunnel to relieve the numbness and tingling in the nocturnal symptoms as well as released the first extensor compartment to relieve the ulnar deviation symptoms.  She has been cautioned on the superficial radial nerve branch which must be moved and may be caused some numbness after surgery   She is happy to proceed with a right carpal tunnel release and right first extensor compartment release.

## 2017-07-14 ENCOUNTER — Ambulatory Visit (HOSPITAL_COMMUNITY)
Admission: RE | Admit: 2017-07-14 | Discharge: 2017-07-14 | Disposition: A | Discharge status: Home / Self Care | Payer: Medicare Other | Source: Ambulatory Visit | Attending: Orthopedic Surgery | Admitting: Orthopedic Surgery

## 2017-07-14 ENCOUNTER — Ambulatory Visit (HOSPITAL_COMMUNITY): Payer: Medicare Other | Admitting: Anesthesiology

## 2017-07-14 ENCOUNTER — Encounter (HOSPITAL_COMMUNITY)
Admission: RE | Disposition: A | Discharge status: Home / Self Care | Payer: Self-pay | Source: Ambulatory Visit | Attending: Orthopedic Surgery

## 2017-07-14 ENCOUNTER — Encounter (HOSPITAL_COMMUNITY): Payer: Self-pay | Admitting: *Deleted

## 2017-07-14 DIAGNOSIS — Z888 Allergy status to other drugs, medicaments and biological substances status: Secondary | ICD-10-CM | POA: Diagnosis not present

## 2017-07-14 DIAGNOSIS — M654 Radial styloid tenosynovitis [de Quervain]: Secondary | ICD-10-CM

## 2017-07-14 DIAGNOSIS — Z881 Allergy status to other antibiotic agents status: Secondary | ICD-10-CM | POA: Insufficient documentation

## 2017-07-14 DIAGNOSIS — G5601 Carpal tunnel syndrome, right upper limb: Secondary | ICD-10-CM

## 2017-07-14 HISTORY — PX: DORSAL COMPARTMENT RELEASE: SHX5039

## 2017-07-14 HISTORY — PX: CARPAL TUNNEL RELEASE: SHX101

## 2017-07-14 SURGERY — CARPAL TUNNEL RELEASE
Anesthesia: Regional | Site: Wrist | Laterality: Right

## 2017-07-14 MED ORDER — FENTANYL CITRATE (PF) 100 MCG/2ML IJ SOLN
25.0000 ug | Freq: Once | INTRAMUSCULAR | Status: AC
  Administered 2017-07-14: 25 ug via INTRAVENOUS

## 2017-07-14 MED ORDER — BUPIVACAINE HCL (PF) 0.5 % IJ SOLN
INTRAMUSCULAR | Status: DC | PRN
  Administered 2017-07-14 (×2): 10 mL

## 2017-07-14 MED ORDER — ONDANSETRON HCL 4 MG/2ML IJ SOLN
INTRAMUSCULAR | Status: AC
  Filled 2017-07-14: qty 2

## 2017-07-14 MED ORDER — DEXAMETHASONE SODIUM PHOSPHATE 4 MG/ML IJ SOLN
4.0000 mg | INTRAMUSCULAR | Status: AC
  Administered 2017-07-14: 4 mg via INTRAVENOUS

## 2017-07-14 MED ORDER — FENTANYL CITRATE (PF) 100 MCG/2ML IJ SOLN: 25.0000 ug | INTRAMUSCULAR | Status: DC | PRN

## 2017-07-14 MED ORDER — SODIUM CHLORIDE 0.9% FLUSH
INTRAVENOUS | Status: AC
  Filled 2017-07-14: qty 10

## 2017-07-14 MED ORDER — MIDAZOLAM HCL 2 MG/2ML IJ SOLN
INTRAMUSCULAR | Status: AC
  Filled 2017-07-14: qty 2

## 2017-07-14 MED ORDER — SUCCINYLCHOLINE CHLORIDE 20 MG/ML IJ SOLN
INTRAMUSCULAR | Status: AC
  Filled 2017-07-14: qty 1

## 2017-07-14 MED ORDER — LACTATED RINGERS IV SOLN
INTRAVENOUS | Status: DC
  Administered 2017-07-14: 1000 mL via INTRAVENOUS

## 2017-07-14 MED ORDER — LIDOCAINE HCL (PF) 0.5 % IJ SOLN
INTRAMUSCULAR | Status: AC
  Filled 2017-07-14: qty 50

## 2017-07-14 MED ORDER — PROPOFOL 10 MG/ML IV BOLUS
INTRAVENOUS | Status: AC
  Filled 2017-07-14: qty 40

## 2017-07-14 MED ORDER — MIDAZOLAM HCL 5 MG/5ML IJ SOLN
INTRAMUSCULAR | Status: DC | PRN
  Administered 2017-07-14 (×2): 1 mg via INTRAVENOUS

## 2017-07-14 MED ORDER — 0.9 % SODIUM CHLORIDE (POUR BTL) OPTIME
TOPICAL | Status: DC | PRN
  Administered 2017-07-14: 1000 mL

## 2017-07-14 MED ORDER — CEFAZOLIN SODIUM-DEXTROSE 2-4 GM/100ML-% IV SOLN
INTRAVENOUS | Status: AC
  Filled 2017-07-14: qty 100

## 2017-07-14 MED ORDER — ONDANSETRON HCL 4 MG/2ML IJ SOLN
4.0000 mg | Freq: Once | INTRAMUSCULAR | Status: AC
  Administered 2017-07-14: 4 mg via INTRAVENOUS

## 2017-07-14 MED ORDER — CEFAZOLIN SODIUM-DEXTROSE 1-4 GM/50ML-% IV SOLN
INTRAVENOUS | Status: AC
  Filled 2017-07-14: qty 50

## 2017-07-14 MED ORDER — DEXTROSE 5 % IV SOLN
3.0000 g | INTRAVENOUS | Status: AC
  Administered 2017-07-14: 3 g via INTRAVENOUS
  Filled 2017-07-14: qty 3000

## 2017-07-14 MED ORDER — BUPIVACAINE HCL (PF) 0.5 % IJ SOLN
INTRAMUSCULAR | Status: AC
  Filled 2017-07-14: qty 30

## 2017-07-14 MED ORDER — ACETAMINOPHEN-CODEINE #3 300-30 MG PO TABS: 1.0000 | ORAL_TABLET | Freq: Four times a day (QID) | ORAL | 0 refills | Status: DC | PRN

## 2017-07-14 MED ORDER — PROPOFOL 500 MG/50ML IV EMUL
INTRAVENOUS | Status: DC | PRN
  Administered 2017-07-14: 100 ug/kg/min via INTRAVENOUS
  Administered 2017-07-14: 08:00:00 via INTRAVENOUS

## 2017-07-14 MED ORDER — CHLORHEXIDINE GLUCONATE 4 % EX LIQD: 60.0000 mL | Freq: Once | CUTANEOUS | Status: DC

## 2017-07-14 MED ORDER — DEXAMETHASONE SODIUM PHOSPHATE 4 MG/ML IJ SOLN
INTRAMUSCULAR | Status: AC
  Filled 2017-07-14: qty 1

## 2017-07-14 MED ORDER — MIDAZOLAM HCL 2 MG/2ML IJ SOLN
1.0000 mg | INTRAMUSCULAR | Status: AC
  Administered 2017-07-14: 2 mg via INTRAVENOUS

## 2017-07-14 MED ORDER — LIDOCAINE HCL (PF) 0.5 % IJ SOLN
INTRAMUSCULAR | Status: DC | PRN
  Administered 2017-07-14: 45 mL via INTRAVENOUS
  Administered 2017-07-14: 45 mL

## 2017-07-14 MED ORDER — FENTANYL CITRATE (PF) 100 MCG/2ML IJ SOLN
INTRAMUSCULAR | Status: AC
  Filled 2017-07-14: qty 2

## 2017-07-14 SURGICAL SUPPLY — 45 items
BAG HAMPER (MISCELLANEOUS) ×2 IMPLANT
BANDAGE ELASTIC 3 LF NS (GAUZE/BANDAGES/DRESSINGS) ×2 IMPLANT
BANDAGE ELASTIC 3 VELCRO ST LF (GAUZE/BANDAGES/DRESSINGS) ×2 IMPLANT
BANDAGE ESMARK 4X12 BL STRL LF (DISPOSABLE) ×1 IMPLANT
BENZOIN TINCTURE PRP APPL 2/3 (GAUZE/BANDAGES/DRESSINGS) ×2 IMPLANT
BLADE SURG 15 STRL LF DISP TIS (BLADE) ×1 IMPLANT
BLADE SURG 15 STRL SS (BLADE) ×1
BNDG COHESIVE 4X5 TAN STRL (GAUZE/BANDAGES/DRESSINGS) ×2 IMPLANT
BNDG ESMARK 4X12 BLUE STRL LF (DISPOSABLE) ×2
BNDG GAUZE ELAST 4 BULKY (GAUZE/BANDAGES/DRESSINGS) ×2 IMPLANT
CHLORAPREP W/TINT 26ML (MISCELLANEOUS) ×2 IMPLANT
CLOTH BEACON ORANGE TIMEOUT ST (SAFETY) ×2 IMPLANT
CLSR STERI-STRIP ANTIMIC 1/2X4 (GAUZE/BANDAGES/DRESSINGS) ×2 IMPLANT
COVER LIGHT HANDLE STERIS (MISCELLANEOUS) ×4 IMPLANT
CUFF TOURNIQUET SINGLE 18IN (TOURNIQUET CUFF) ×2 IMPLANT
DECANTER SPIKE VIAL GLASS SM (MISCELLANEOUS) ×2 IMPLANT
DRAPE PROXIMA HALF (DRAPES) ×2 IMPLANT
DRSG XEROFORM 1X8 (GAUZE/BANDAGES/DRESSINGS) ×2 IMPLANT
ELECT NEEDLE TIP 2.8 STRL (NEEDLE) ×2 IMPLANT
ELECT REM PT RETURN 9FT ADLT (ELECTROSURGICAL) ×2
ELECTRODE REM PT RTRN 9FT ADLT (ELECTROSURGICAL) ×1 IMPLANT
GAUZE KERLIX 2X3 DERM STRL LF (GAUZE/BANDAGES/DRESSINGS) ×2 IMPLANT
GAUZE SPONGE 4X4 12PLY STRL (GAUZE/BANDAGES/DRESSINGS) ×2 IMPLANT
GLOVE BIOGEL PI IND STRL 7.0 (GLOVE) ×1 IMPLANT
GLOVE BIOGEL PI INDICATOR 7.0 (GLOVE) ×1
GLOVE SKINSENSE NS SZ8.0 LF (GLOVE) ×1
GLOVE SKINSENSE STRL SZ8.0 LF (GLOVE) ×1 IMPLANT
GLOVE SS N UNI LF 8.5 STRL (GLOVE) ×2 IMPLANT
GOWN STRL REUS W/ TWL LRG LVL3 (GOWN DISPOSABLE) ×1 IMPLANT
GOWN STRL REUS W/TWL LRG LVL3 (GOWN DISPOSABLE) ×3 IMPLANT
GOWN STRL REUS W/TWL XL LVL3 (GOWN DISPOSABLE) ×2 IMPLANT
HAND ALUMI XLG (SOFTGOODS) ×2 IMPLANT
KIT ROOM TURNOVER APOR (KITS) ×2 IMPLANT
MANIFOLD NEPTUNE II (INSTRUMENTS) ×2 IMPLANT
NEEDLE HYPO 21X1.5 SAFETY (NEEDLE) ×2 IMPLANT
NS IRRIG 1000ML POUR BTL (IV SOLUTION) ×2 IMPLANT
PACK BASIC LIMB (CUSTOM PROCEDURE TRAY) ×2 IMPLANT
PAD ARMBOARD 7.5X6 YLW CONV (MISCELLANEOUS) ×2 IMPLANT
SET BASIN LINEN APH (SET/KITS/TRAYS/PACK) ×2 IMPLANT
STRIP CLOSURE SKIN 1/2X4 (GAUZE/BANDAGES/DRESSINGS) ×2 IMPLANT
SUT ETHILON 3 0 FSL (SUTURE) ×2 IMPLANT
SUT MON AB 2-0 SH 27 (SUTURE) ×1
SUT MON AB 2-0 SH27 (SUTURE) ×1 IMPLANT
SYR CONTROL 10ML LL (SYRINGE) ×2 IMPLANT
SYRINGE 10CC LL (SYRINGE) ×2 IMPLANT

## 2017-07-14 NOTE — Anesthesia Postprocedure Evaluation (Signed)
Anesthesia Post Note  Patient: Donna Barron  Procedure(s) Performed: CARPAL TUNNEL RELEASE (Right Wrist) RELEASE DORSAL COMPARTMENT (DEQUERVAIN) (Right Wrist)  Patient location during evaluation: PACU Anesthesia Type: Bier Block Level of consciousness: awake and alert, oriented and patient cooperative Pain management: pain level controlled Vital Signs Assessment: post-procedure vital signs reviewed and stable Respiratory status: respiratory function stable Cardiovascular status: stable Postop Assessment: no apparent nausea or vomiting Anesthetic complications: no     Last Vitals:  Vitals:   07/14/17 0710 07/14/17 0715  BP: 138/71 133/66  Resp: 14 (!) 25  Temp:    SpO2: 94% 94%    Last Pain:  Vitals:   07/14/17 0645  TempSrc: Oral                 [name] A

## 2017-07-14 NOTE — Anesthesia Preprocedure Evaluation (Signed)
Anesthesia Evaluation  Patient identified by MRN, date of birth, ID band Patient awake    Reviewed: Allergy & Precautions, H&P , NPO status , Patient's Chart, lab work & pertinent test results, reviewed documented beta blocker date and time   History of Anesthesia Complications (+) PONV and history of anesthetic complications  Airway Mallampati: II  TM Distance: >3 FB Neck ROM: Full    Dental  (+) Upper Dentures, Partial Lower   Pulmonary neg pulmonary ROS,    Pulmonary exam normal breath sounds clear to auscultation       Cardiovascular hypertension, Pt. on medications and Pt. on home beta blockers Normal cardiovascular exam+ dysrhythmias  Rhythm:Regular Rate:Normal     Neuro/Psych PSYCHIATRIC DISORDERS Depression negative neurological ROS     GI/Hepatic Neg liver ROS, GERD  Medicated and Controlled,  Endo/Other  Morbid obesityHyperlipidemia  Renal/GU negative Renal ROS  negative genitourinary   Musculoskeletal negative musculoskeletal ROS (+)   Abdominal (+) + obese,   Peds  Hematology negative hematology ROS (+)   Anesthesia Other Findings   Reproductive/Obstetrics                             Anesthesia Physical Anesthesia Plan  ASA: II  Anesthesia Plan: Bier Block and Bier Block-LIDOCAINE ONLY   Post-op Pain Management:    Induction: Intravenous  PONV Risk Score and Plan:   Airway Management Planned: Simple Face Mask  Additional Equipment:   Intra-op Plan:   Post-operative Plan:   Informed Consent: I have reviewed the patients History and Physical, chart, labs and discussed the procedure including the risks, benefits and alternatives for the proposed anesthesia with the patient or authorized representative who has indicated his/her understanding and acceptance.     Plan Discussed with:   Anesthesia Plan Comments:         Anesthesia Quick Evaluation

## 2017-07-14 NOTE — Anesthesia Procedure Notes (Signed)
Anesthesia Regional Block: Bier block (IV Regional)   Pre-Anesthetic Checklist: ,, timeout performed, Correct Patient, Correct Site, Correct Laterality, Correct Procedure,, site marked, surgical consent,, at surgeon's request  Laterality: Right     Needles:  Injection technique: Single-shot  Needle Type: Other      Needle Gauge: 22     Additional Needles:   Procedures:,,,,, intact distal pulses, Esmarch exsanguination, single tourniquet utilized,   Nerve Stimulator or Paresthesia:   Additional Responses:  Pulse checked post tourniquet inflation. IV NSL discontinued post injection. Narrative:  Start time: 07/14/2017 7:33 AM  Performed by: Personally

## 2017-07-14 NOTE — Transfer of Care (Signed)
Immediate Anesthesia Transfer of Care Note  Patient: Donna Barron  Procedure(s) Performed: CARPAL TUNNEL RELEASE (Right Wrist) RELEASE DORSAL COMPARTMENT (DEQUERVAIN) (Right Wrist)  Patient Location: PACU  Anesthesia Type:MAC  Level of Consciousness: awake, oriented and patient cooperative  Airway & Oxygen Therapy: Patient Spontanous Breathing and Patient connected to nasal cannula oxygen  Post-op Assessment: Report given to RN and Post -op Vital signs reviewed and stable  Post vital signs: Reviewed and stable  Last Vitals:  Vitals:   07/14/17 0710 07/14/17 0715  BP: 138/71 133/66  Resp: 14 (!) 25  Temp:    SpO2: 94% 94%    Last Pain:  Vitals:   07/14/17 0645  TempSrc: Oral      Patients Stated Pain Goal: 8 (54/65/03 [id number])  Complications: No apparent anesthesia complications

## 2017-07-14 NOTE — Discharge Instructions (Signed)

## 2017-07-14 NOTE — Interval H&P Note (Signed)
History and Physical Interval Note:  07/14/2017 7:12 AM  BP (!) 147/80   Temp 98 F (36.7 C) (Oral)   Resp (!) 26   SpO2 96%   Donna Barron  has presented today for surgery, with the diagnosis of De Quervain's syndrome right wrist, carpal tunnel syndrome right wrist  The various methods of treatment have been discussed with the patient and family. After consideration of risks, benefits and other options for treatment, the patient has consented to  Procedure(s): CARPAL TUNNEL RELEASE (Right) [place] (DEQUERVAIN) (Right) as a surgical intervention .  The patient's history has been reviewed, patient examined, no change in status, stable for surgery.  I have reviewed the patient's chart and labs.  Questions were answered to the patient's satisfaction.     Arther Abbott

## 2017-07-14 NOTE — Op Note (Signed)
07/14/2017  8:13 AM  PATIENT:  Donna Barron  77 y.o. female  PRE-OPERATIVE DIAGNOSIS:  De Quervain's syndrome right wrist, carpal tunnel syndrome right wrist  POST-OPERATIVE DIAGNOSIS:  Tennis Must Quervain's syndrome right wrist, carpal tunnel syndrome right wrist  PROCEDURE:  Procedure(s): CARPAL TUNNEL RELEASE (Right) RELEASE DORSAL COMPARTMENT (DEQUERVAIN) (Right)  Surgical findings compression median nerve discoloration median nerve right wrist  Tenosynovitis and stenosis first extensor compartment right wrist  No assistance  Regional block  SURGEON:  Surgeon(s) and Role:    * Carole Civil, MD - Primary  PHYSICIAN ASSISTANT:   ASSISTANTS: none   ANESTHESIA:   regional  EBL:  2 mL   BLOOD ADMINISTERED:none  DRAINS: none   LOCAL MEDICATIONS USED:  MARCAINE     SPECIMEN:  No Specimen  DISPOSITION OF SPECIMEN:  N/A  COUNTS:  YES  TOURNIQUET:   Total Tourniquet Time Documented: Upper Arm (Right) - 40 minutes Total: Upper Arm (Right) - 40 minutes   DICTATION: .Viviann Spare Dictation  PLAN OF CARE: Discharge to home after PACU  PATIENT DISPOSITION:  PACU - hemodynamically stable.   Delay start of Pharmacological VTE agent (>24hrs) due to surgical blood loss or risk of bleeding: not applicable  [id number]  Carpal tunnel release right wrist  Preop diagnosis carpal tunnel syndrome right wrist postop diagnosis same  Procedure open carpal tunnel release right wrist  Surgeon Aline Brochure  Anesthesia regional Bier block  Operative findings compression of the right median nerveoccupied lesions  Indications failure of conservative treatment to relieve pain and paresthesias and numbness and tingling of the right hand.  The patient was identified in the preop area we confirm the surgical site marked as right wrist. Chart update completed. Patient taken to surgery. She had 3 g of Ancef. After establishing a Bier block her arm was prepped with  ChloraPrep.  Timeout executed completed and confirmed site.  A straight incision was made over the carpal tunnel in line with the radial border of the ring finger. Blunt dissection was carried out to find the distal aspect of the carpal tunnel. A blunted judgment was passed beneath the carpal tunnel. Sharp incision was then used to release the transverse carpal ligament. The contents of the carpal tunnel were inspected. The median nerve was compressed with slight discoloration.  The wound was irrigated and then closed with 3-0 nylon suture. We injected 10 mL of plain Marcaine on the radial side of the incision  A sterile bandage was applied and the tourniquet was released the color of the hand and capillary refill were normal  The patient was taken to the recovery room in stable condition  Timeout was taken to start the second procedure we made a longitudinal incision over the first extensor compartment of the right wrist divided subcutaneous tissue found the superficial radial nerve protected it release the first extensor compartment there was a separate tunnel which was also released Tina synovitic to me was performed  Wound was irrigated and closed with 2-0 Monocryl suture in running fashion with Steri-Strips and Marcaine was injected 10 cc  Sterile dressings were applied to both incisions tourniquet was released patient was taken to recovery room in stable condition 07/14/2017  8:13 AM  PATIENT:  Donna Barron  77 y.o. female  PRE-OPERATIVE DIAGNOSIS:  Tennis Must Quervain's syndrome right wrist, carpal tunnel syndrome right wrist  POST-OPERATIVE DIAGNOSIS:  Tennis Must Quervain's syndrome right wrist, carpal tunnel syndrome right wrist  PROCEDURE:  Procedure(s): CARPAL TUNNEL RELEASE (Right) RELEASE DORSAL  COMPARTMENT (DEQUERVAIN) (Right)  Surgical findings compression median nerve discoloration median nerve right wrist  Tenosynovitis and stenosis first extensor compartment right wrist  No  assistance  Regional block  SURGEON:  Surgeon(s) and Role:    * Carole Civil, MD - Primary  PHYSICIAN ASSISTANT:   ASSISTANTS: none   ANESTHESIA:   regional  EBL:  2 mL   BLOOD ADMINISTERED:none  DRAINS: none   LOCAL MEDICATIONS USED:  MARCAINE     SPECIMEN:  No Specimen  DISPOSITION OF SPECIMEN:  N/A  COUNTS:  YES  TOURNIQUET:   Total Tourniquet Time Documented: Upper Arm (Right) - 40 minutes Total: Upper Arm (Right) - 40 minutes   DICTATION: .Viviann Spare Dictation  PLAN OF CARE: Discharge to home after PACU  PATIENT DISPOSITION:  PACU - hemodynamically stable.   Delay start of Pharmacological VTE agent (>24hrs) due to surgical blood loss or risk of bleeding: not applicable  [id number]  Carpal tunnel release right wrist  Preop diagnosis carpal tunnel syndrome right wrist postop diagnosis same  Procedure open carpal tunnel release right wrist  Surgeon Aline Brochure  Anesthesia regional Bier block  Operative findings compression of the right median nerveoccupied lesions  Indications failure of conservative treatment to relieve pain and paresthesias and numbness and tingling of the right hand.  The patient was identified in the preop area we confirm the surgical site marked as right wrist. Chart update completed. Patient taken to surgery. She had 3 g of Ancef. After establishing a Bier block her arm was prepped with ChloraPrep.  Timeout executed completed and confirmed site.  A straight incision was made over the carpal tunnel in line with the radial border of the ring finger. Blunt dissection was carried out to find the distal aspect of the carpal tunnel. A blunted judgment was passed beneath the carpal tunnel. Sharp incision was then used to release the transverse carpal ligament. The contents of the carpal tunnel were inspected. The median nerve was compressed with slight discoloration.  The wound was irrigated and then closed with 3-0 nylon suture.  We injected 10 mL of plain Marcaine on the radial side of the incision  A sterile bandage was applied and the tourniquet was released the color of the hand and capillary refill were normal  The patient was taken to the recovery room in stable condition  Timeout was taken to start the second procedure we made a longitudinal incision over the first extensor compartment of the right wrist divided subcutaneous tissue found the superficial radial nerve protected it release the first extensor compartment there was a separate tunnel which was also released Tina synovitic to me was performed  Wound was irrigated and closed with 2-0 Monocryl suture in running fashion with Steri-Strips and Marcaine was injected 10 cc  Sterile dressings were applied to both incisions tourniquet was released patient was taken to recovery room in stable condition

## 2017-07-14 NOTE — Progress Notes (Signed)
Dr. Aline Brochure in to speak to patient and family prior to discharge.

## 2017-07-14 NOTE — Anesthesia Procedure Notes (Signed)
Procedure Name: MAC Date/Time: 07/14/2017 7:20 AM Performed by: Andree Elk Amy A, CRNA Pre-anesthesia Checklist: Patient identified, Timeout performed, Emergency Drugs available, Suction available and Patient being monitored Oxygen Delivery Method: Simple face mask

## 2017-07-14 NOTE — Brief Op Note (Signed)
07/14/2017  8:13 AM  PATIENT:  Donna Barron  77 y.o. female  PRE-OPERATIVE DIAGNOSIS:  De Quervain's syndrome right wrist, carpal tunnel syndrome right wrist  POST-OPERATIVE DIAGNOSIS:  Tennis Must Quervain's syndrome right wrist, carpal tunnel syndrome right wrist  PROCEDURE:  Procedure(s): CARPAL TUNNEL RELEASE (Right) RELEASE DORSAL COMPARTMENT (DEQUERVAIN) (Right)  Surgical findings compression median nerve discoloration median nerve right wrist  Tenosynovitis and stenosis first extensor compartment right wrist  No assistance  Regional block  SURGEON:  Surgeon(s) and Role:    * Carole Civil, MD - Primary  PHYSICIAN ASSISTANT:   ASSISTANTS: none   ANESTHESIA:   regional  EBL:  2 mL   BLOOD ADMINISTERED:none  DRAINS: none   LOCAL MEDICATIONS USED:  MARCAINE     SPECIMEN:  No Specimen  DISPOSITION OF SPECIMEN:  N/A  COUNTS:  YES  TOURNIQUET:   Total Tourniquet Time Documented: Upper Arm (Right) - 40 minutes Total: Upper Arm (Right) - 40 minutes   DICTATION: .Viviann Spare Dictation  PLAN OF CARE: Discharge to home after PACU  PATIENT DISPOSITION:  PACU - hemodynamically stable.   Delay start of Pharmacological VTE agent (>24hrs) due to surgical blood loss or risk of bleeding: not applicable  [id number]  Carpal tunnel release right wrist  Preop diagnosis carpal tunnel syndrome right wrist postop diagnosis same  Procedure open carpal tunnel release right wrist  Surgeon Aline Brochure  Anesthesia regional Bier block  Operative findings compression of the right median nerveoccupied lesions  Indications failure of conservative treatment to relieve pain and paresthesias and numbness and tingling of the right hand.  The patient was identified in the preop area we confirm the surgical site marked as right wrist. Chart update completed. Patient taken to surgery. She had 3 g of Ancef. After establishing a Bier block her arm was prepped with  ChloraPrep.  Timeout executed completed and confirmed site.  A straight incision was made over the right carpal tunnel in line with the radial border of the ring finger. Blunt dissection was carried out to find the distal aspect of the carpal tunnel. A blunted judgment was passed beneath the carpal tunnel. Sharp incision was then used to release the transverse carpal ligament. The contents of the carpal tunnel were inspected. The median nerve was compressed with slight discoloration.  The wound was irrigated and then closed with 3-0 nylon suture. We injected 10 mL of plain Marcaine on the radial side of the incision  A sterile bandage was applied and the tourniquet was released the color of the hand and capillary refill were normal  The patient was taken to the recovery room in stable condition  Timeout was taken to start the second procedure we made a longitudinal incision over the first extensor compartment of the right wrist divided subcutaneous tissue found the superficial radial nerve protected it release the first extensor compartment there was a separate tunnel which was also released Tina synovitic to me was performed  Wound was irrigated and closed with 2-0 Monocryl suture in running fashion with Steri-Strips and Marcaine was injected 10 cc  Sterile dressings were applied to both incisions tourniquet was released patient was taken to recovery room in stable condition

## 2017-07-20 DIAGNOSIS — Z9889 Other specified postprocedural states: Secondary | ICD-10-CM | POA: Insufficient documentation

## 2017-07-22 ENCOUNTER — Ambulatory Visit (INDEPENDENT_AMBULATORY_CARE_PROVIDER_SITE_OTHER): Payer: Medicare Other | Admitting: Orthopedic Surgery

## 2017-07-22 VITALS — BP 115/75 | HR 85 | Ht 62.0 in | Wt 231.0 lb

## 2017-07-22 DIAGNOSIS — G5601 Carpal tunnel syndrome, right upper limb: Secondary | ICD-10-CM

## 2017-07-22 DIAGNOSIS — M654 Radial styloid tenosynovitis [de Quervain]: Secondary | ICD-10-CM

## 2017-07-22 DIAGNOSIS — Z9889 Other specified postprocedural states: Secondary | ICD-10-CM

## 2017-07-22 NOTE — Progress Notes (Signed)
Postop day  #8 postop visit #1 status post right carpal tunnel release right de Quervain's first extensor compartment release  Here for wound check  Her wounds look fine  She was allergic to the codeine caused itching  I change the dressings  The sutures will need to come out in a week in the palm and then on the wrist the suture ends need to be cut then she can shower  Follow-up for suture removal

## 2017-07-28 ENCOUNTER — Encounter: Payer: Self-pay | Admitting: Orthopaedic Surgery

## 2017-07-28 ENCOUNTER — Ambulatory Visit (INDEPENDENT_AMBULATORY_CARE_PROVIDER_SITE_OTHER): Payer: Medicare Other | Admitting: Orthopaedic Surgery

## 2017-07-28 DIAGNOSIS — Z9889 Other specified postprocedural states: Secondary | ICD-10-CM

## 2017-07-28 NOTE — Progress Notes (Signed)
CC:  My hand is better  She is post surgery for carpal tunnel and DeQuervain on the right.  The DeQuervain wound is well healed, no redness.  The carpal tunnel wound has slight skin opening in mid portion, no redness, no discharge, no swelling.  Every other suture removed.  She is to return next week for the remaining sutures to be removed.  Encounter Diagnosis  Name Primary?  . S/P carpal tunnel and DeQuervains release right 07/14/17 Yes   Call if any problem.  Precautions discussed.   Electronically Signed Sanjuana Kava, MD 2/14/20199:58 AM

## 2017-07-28 NOTE — Progress Notes (Signed)
Patient present for suture removal, every other suture removed per Dr Luna Glasgow, leave every other intact until patient heals a bit more, she is scheduled to return on Monday for remaining suture removal.

## 2017-08-01 ENCOUNTER — Ambulatory Visit (INDEPENDENT_AMBULATORY_CARE_PROVIDER_SITE_OTHER): Payer: Medicare Other | Admitting: Orthopedic Surgery

## 2017-08-01 VITALS — BP 93/59 | HR 74 | Ht 62.0 in | Wt 208.0 lb

## 2017-08-01 DIAGNOSIS — Z9889 Other specified postprocedural states: Secondary | ICD-10-CM

## 2017-08-01 NOTE — Progress Notes (Signed)
Chief Complaint  Patient presents with  . Follow-up    Recheck on CTR, DOS 07-14-17.    The patient had a carpal tunnel release and a de Quervain's release on the right upper extremity and the wound over the carpal tunnel was treated with every other suture removal and follow-up for subsequent complete suture removal which was done today.  She is advised to Place Neosporin over the wound covered with a Band-Aid and no submerged baths times 1 week follow-up in 2 weeks

## 2017-08-01 NOTE — Patient Instructions (Signed)
Neosporin and Band-Aid over the palm okay to take a bath 1 week follow-up 2 weeks

## 2017-08-16 ENCOUNTER — Ambulatory Visit (INDEPENDENT_AMBULATORY_CARE_PROVIDER_SITE_OTHER): Payer: Self-pay | Admitting: Orthopedic Surgery

## 2017-08-16 VITALS — BP 123/69 | HR 76 | Ht 62.0 in | Wt 208.0 lb

## 2017-08-16 DIAGNOSIS — Z9889 Other specified postprocedural states: Secondary | ICD-10-CM

## 2017-08-16 NOTE — Progress Notes (Signed)
Postop visit status post de Quervain's release right wrist and carpal tunnel release right hand and wrist date of surgery was July 14, 2017  The patient complains of soreness over the incision of the right hand and incision of the right wrist.  Both areas show no evidence of infection no drainage but tender to palpation her carpal tunnel symptoms are resolving but not completely resolved  She complains of crepitance and pain in her left knee status post left knee arthroscopy partial medial meniscectomy done by Dr. Para March several years ago she would like that evaluated and we will do that on the next visit with x-ray and new problem evaluation.  Encounter Diagnosis  Name Primary?  . S/P carpal tunnel and DeQuervains release right 07/14/17 Yes

## 2017-09-13 ENCOUNTER — Ambulatory Visit (INDEPENDENT_AMBULATORY_CARE_PROVIDER_SITE_OTHER): Payer: Medicare Other

## 2017-09-13 ENCOUNTER — Encounter: Payer: Self-pay | Admitting: Orthopedic Surgery

## 2017-09-13 ENCOUNTER — Ambulatory Visit (INDEPENDENT_AMBULATORY_CARE_PROVIDER_SITE_OTHER): Payer: Medicare Other | Admitting: Orthopedic Surgery

## 2017-09-13 VITALS — BP 108/74 | HR 72 | Ht 62.0 in | Wt 205.0 lb

## 2017-09-13 DIAGNOSIS — M1712 Unilateral primary osteoarthritis, left knee: Secondary | ICD-10-CM | POA: Diagnosis not present

## 2017-09-13 DIAGNOSIS — G8929 Other chronic pain: Secondary | ICD-10-CM | POA: Diagnosis not present

## 2017-09-13 DIAGNOSIS — M25562 Pain in left knee: Secondary | ICD-10-CM

## 2017-09-13 DIAGNOSIS — Z9889 Other specified postprocedural states: Secondary | ICD-10-CM | POA: Diagnosis not present

## 2017-09-13 NOTE — Progress Notes (Signed)
Progress Note   Patient ID: Donna Barron, female   DOB: 06/13/1941, 77 y.o.   MRN: [id number]  Chief Complaint  Patient presents with  . Knee Pain    left  . Post-op Follow-up    right hand CTR and DeQuervains release 07/14/17     77 yo female presents with left knee pain status post left knee arthroscopy 5-6 years ago had a stress fracture and a medial meniscectomy taking care of in [place]  She complains of pain with weightbearing with a dull ache over the medial side of the left knee now for several months associated with crepitance but no catching locking or giving way.  Takes Tylenol for pain does not completely relieve her pain    Review of Systems  Constitutional: Negative.   Neurological: Negative.    Current Meds  Medication Sig  . acetaminophen-codeine (TYLENOL #3) 300-30 MG tablet Take 1 tablet by mouth every 6 (six) hours as needed for moderate pain.  Marland Kitchen aspirin EC 81 MG tablet Take 81 mg by mouth daily.  Marland Kitchen escitalopram (LEXAPRO) 20 MG tablet Take 20 mg by mouth at bedtime.   . fluticasone (FLONASE) 50 MCG/ACT nasal spray Place 1 spray into both nostrils daily as needed (for allergies.).   Marland Kitchen metoprolol succinate (TOPROL-XL) 50 MG 24 hr tablet Take 75 mg by mouth daily. Take with or immediately following a meal.  . pantoprazole (PROTONIX) 40 MG tablet Take 40 mg by mouth daily.   . simvastatin (ZOCOR) 40 MG tablet Take 40 mg by mouth at bedtime.  . triamterene-hydrochlorothiazide (MAXZIDE) 75-50 MG per tablet Take 0.5 tablets by mouth daily.    Past Medical History:  Diagnosis Date  . Depression   . Dysrhythmia    heart palipations  . GERD (gastroesophageal reflux disease)   . HTN (hypertension)   . Hypercholesteremia   . PONV (postoperative nausea and vomiting)      Allergies  Allergen Reactions  . Avelox [Moxifloxacin Hcl In Nacl] Other (See Comments)    insomnia  . Oxybutynin Palpitations and Other (See Comments)    Tongue felt coated     BP 108/74    Pulse 72   Ht 5\' 2"  (1.575 m)   Wt 205 lb (93 kg)   BMI 37.49 kg/m     Physical Exam  Constitutional: She is oriented to person, place, and time. She appears well-developed and well-nourished.  Musculoskeletal:       Left knee: She exhibits no effusion.  Neurological: She is alert and oriented to person, place, and time.  Psychiatric: She has a normal mood and affect. Judgment normal.  Vitals reviewed.   Left Knee Exam   Muscle Strength  The patient has normal left knee strength.  Tenderness  The patient is experiencing tenderness in the medial joint line.  Range of Motion  Extension: normal  Flexion: 120   Tests  McMurray:  Medial - negative  Drawer:  Anterior - negative     Posterior - negative  Other  Erythema: absent Scars: absent Sensation: normal Pulse: present Swelling: none Effusion: no effusion present     De Quervain's release and carpal tunnel release both areas look fine no tenderness    Medical decision-making  Imaging:   [facility] report  Dictated by Dr. Aline Brochure   Chief complaint pain left knee  3 views left knee.  Knee is an appropriate physiologic valgus.  Joint space narrowing is noted in the medial compartment approximately 75% consistent  with osteoarthritis no other gross findings are noted  Impression osteoarthritis left knee   Encounter Diagnoses  Name Primary?  . S/P carpal tunnel and DeQuervains release right 07/14/17   . Chronic pain of left knee   . Primary osteoarthritis of left knee Yes   Procedure note left knee injection verbal consent was obtained to inject left knee joint  Timeout was completed to confirm the site of injection  The medications used were 40 mg of Depo-Medrol and 1% lidocaine 3 cc  Anesthesia was provided by ethyl chloride and the skin was prepped with alcohol.  After cleaning the skin with alcohol a 20-gauge needle was used to inject the left knee joint. There were no  complications. A sterile bandage was applied.  Fu prn   Arther Abbott, MD 09/13/2017 2:25 PM

## 2018-03-24 ENCOUNTER — Ambulatory Visit (INDEPENDENT_AMBULATORY_CARE_PROVIDER_SITE_OTHER): Payer: Medicare Other

## 2018-03-24 ENCOUNTER — Encounter: Payer: Self-pay | Admitting: Orthopedic Surgery

## 2018-03-24 ENCOUNTER — Ambulatory Visit (INDEPENDENT_AMBULATORY_CARE_PROVIDER_SITE_OTHER): Payer: Medicare Other | Admitting: Orthopedic Surgery

## 2018-03-24 VITALS — BP 129/78 | HR 81 | Ht 62.0 in | Wt 212.0 lb

## 2018-03-24 DIAGNOSIS — M542 Cervicalgia: Secondary | ICD-10-CM

## 2018-03-24 DIAGNOSIS — M7552 Bursitis of left shoulder: Secondary | ICD-10-CM

## 2018-03-24 DIAGNOSIS — M25512 Pain in left shoulder: Secondary | ICD-10-CM | POA: Diagnosis not present

## 2018-03-24 DIAGNOSIS — M47812 Spondylosis without myelopathy or radiculopathy, cervical region: Secondary | ICD-10-CM | POA: Diagnosis not present

## 2018-03-24 MED ORDER — PREDNISONE 10 MG PO TABS: 10.0000 mg | ORAL_TABLET | Freq: Every day | ORAL | 0 refills | Status: DC

## 2018-03-24 MED ORDER — METHOCARBAMOL 500 MG PO TABS: 500.0000 mg | ORAL_TABLET | Freq: Three times a day (TID) | ORAL | 1 refills | Status: DC

## 2018-03-24 NOTE — Patient Instructions (Addendum)
You have received an injection of steroids into the joint. 15% of patients will have increased pain within the 24 hours postinjection.   This is transient and will go away.   We recommend that you use ice packs on the injection site for 20 minutes every 2 hours and extra strength Tylenol 2 tablets every 8 as needed until the pain resolves.  If you continue to have pain after taking the Tylenol and using the ice please call the office for further instructions.  [phone] Call to set up the physcial therapy appointment [facility] in [place]

## 2018-03-24 NOTE — Progress Notes (Signed)
NEW PROBLEM   Chief Complaint  Patient presents with  . Shoulder Pain    left    This is a 77 year old female presents for evaluation of her cervical spine and left shoulder complaining of neck and shoulder pain on the left side x2 months.  She did see primary care she took Aleve took Biofreeze to try some Mobic which did not help  Pain quality is aching and spasming severity is moderate associated findings include decreased range of motion in the neck and shoulder   Review of Systems  Constitutional: Negative for chills, fever, malaise/fatigue and weight loss.  Musculoskeletal:       Gait disturbance negative  Skin: Negative for rash.  Neurological: Negative for tingling, sensory change, focal weakness, weakness and headaches.     Past Medical History:  Diagnosis Date  . Depression   . Dysrhythmia    heart palipations  . GERD (gastroesophageal reflux disease)   . HTN (hypertension)   . Hypercholesteremia   . PONV (postoperative nausea and vomiting)     Past Surgical History:  Procedure Laterality Date  . APPENDECTOMY    . ARTHROSCOPIC REPAIR ACL  05/2008   Left knee  . CARPAL TUNNEL RELEASE Right 07/14/2017   Procedure: CARPAL TUNNEL RELEASE;  Surgeon: Carole Civil, MD;  Location: [facility];  Service: Orthopedics;  Laterality: Right;  . CHOLECYSTECTOMY    . DILATION AND CURETTAGE OF UTERUS    . DORSAL COMPARTMENT RELEASE Right 07/14/2017   Procedure: RELEASE DORSAL COMPARTMENT (DEQUERVAIN);  Surgeon: Carole Civil, MD;  Location: [facility];  Service: Orthopedics;  Laterality: Right;  . HYSTEROSCOPY W/D&C N/A 12/25/2013   Procedure: DILATATION AND CURETTAGE /HYSTEROSCOPY;  Surgeon: Gus Height, MD;  Location: [facility];  Service: Gynecology;  Laterality: N/A;  . TUBAL LIGATION      Family History  Problem Relation Age of Onset  . Heart failure Mother 1  . Lung cancer Father 36   Social History   Tobacco Use  . Smoking status: Never Smoker  . Smokeless  tobacco: Never Used  Substance Use Topics  . Alcohol use: Yes    Comment: rarely  . Drug use: No    Allergies  Allergen Reactions  . Avelox [Moxifloxacin Hcl In Nacl] Other (See Comments)    insomnia  . Oxybutynin Palpitations and Other (See Comments)    Tongue felt coated     Current Meds  Medication Sig  . aspirin EC 81 MG tablet Take 81 mg by mouth daily.  Marland Kitchen escitalopram (LEXAPRO) 20 MG tablet Take 20 mg by mouth at bedtime.   . metoprolol succinate (TOPROL-XL) 50 MG 24 hr tablet Take 75 mg by mouth daily. Take with or immediately following a meal.  . naproxen sodium (ALEVE) 220 MG tablet Take 220 mg by mouth.  . pantoprazole (PROTONIX) 40 MG tablet Take 40 mg by mouth daily.   . simvastatin (ZOCOR) 40 MG tablet Take 40 mg by mouth at bedtime.  . triamterene-hydrochlorothiazide (MAXZIDE) 75-50 MG per tablet Take 0.5 tablets by mouth daily.    BP 129/78   Pulse 81   Ht 5\' 2"  (1.575 m)   Wt 212 lb (96.2 kg)   BMI 38.78 kg/m   Physical Exam  Constitutional: She is oriented to person, place, and time. She appears well-developed and well-nourished.  Neurological: She is alert and oriented to person, place, and time. Gait normal.  Psychiatric: She has a normal mood and affect.  Vitals reviewed.   Back  Exam   Tenderness  The patient is experiencing tenderness in the cervical.  Range of Motion  Extension: abnormal  Flexion: abnormal  Lateral bend right: abnormal  Lateral bend left: abnormal  Rotation right: abnormal  Rotation left: abnormal   Reflexes  Patellar: normal Achilles: normal Biceps: normal  Other  Erythema: no back redness Scars: absent   Right Shoulder Exam  Right shoulder exam is normal.  Tenderness  The patient is experiencing no tenderness.  Range of Motion  The patient has normal right shoulder ROM.  Muscle Strength  The patient has normal right shoulder strength.  Tests  Apprehension: negative  Other  Erythema:  absent Sensation: normal Pulse: present   Left Shoulder Exam   Tenderness  Left shoulder tenderness location: Peri-acromial tenderness.  Range of Motion  Active abduction: abnormal  Passive abduction: abnormal  Extension: normal  External rotation: normal  Forward flexion: abnormal  Internal rotation 0 degrees: abnormal  Internal rotation 90 degrees: abnormal   Muscle Strength  The patient has normal left shoulder strength.  Tests  Apprehension: negative Hawkins test: negative Cross arm: negative Impingement: negative Drop arm: negative Sulcus: absent  Other  Erythema: absent Sensation: normal Pulse: present         MEDICAL DECISION SECTION  Imaging studies were done please see the reports she has cervical spondylosis from C5-C7 and the shoulder was normal except for suggested proximal migration of the humerus of 2 mm  Encounter Diagnoses  Name Primary?  . Neck pain Yes  . Acute pain of left shoulder   . Cervical spondylosis without myelopathy   . Bursitis of left shoulder     PLAN: (Rx., injectx, surgery, frx, mri/ct) Recommend injection left shoulder for bursitis  Muscle relaxer to help with spasm in the cervical spine  Anti-inflammatory has been tried recommend low-dose prednisone  Recommend physical therapy  Follow-up 7 weeks  Meds ordered this encounter  Medications  . methocarbamol (ROBAXIN) 500 MG tablet    Sig: Take 1 tablet (500 mg total) by mouth 3 (three) times daily.    Dispense:  60 tablet    Refill:  1  . predniSONE (DELTASONE) 10 MG tablet    Sig: Take 1 tablet (10 mg total) by mouth daily.    Dispense:  60 tablet    Refill:  0    Arther Abbott, MD  03/24/2018 9:57 AM

## 2018-04-03 ENCOUNTER — Ambulatory Visit: Payer: Medicare Other | Attending: Orthopedic Surgery | Admitting: Physical Therapy

## 2018-04-03 ENCOUNTER — Other Ambulatory Visit: Payer: Self-pay

## 2018-04-03 DIAGNOSIS — M25512 Pain in left shoulder: Secondary | ICD-10-CM | POA: Diagnosis present

## 2018-04-03 DIAGNOSIS — M542 Cervicalgia: Secondary | ICD-10-CM | POA: Diagnosis not present

## 2018-04-03 NOTE — Therapy (Signed)
[facility] [place], [place], [address] Phone: [phone]   Fax:  [phone]  Physical Therapy Evaluation  Patient Details  Name: Donna Barron MRN: [id number] Date of Birth: 1941/05/11 Referring Provider (PT): Arther Abbott MD.   Encounter Date: 04/03/2018  PT End of Session - 04/03/18 1229    Visit Number  1    Number of Visits  12    Date for PT Re-Evaluation  07/03/18    Authorization Type  FOTO AT LEAST EVERY 5TH VISIT, 10TH VISIT PROGRESS NOTE AND KX MODIFIER AFTER THE 15 VISIT.    PT Start Time  1115    PT Stop Time  1208    PT Time Calculation (min)  53 min    Activity Tolerance  Patient tolerated treatment well    Behavior During Therapy  WFL for tasks assessed/performed       Past Medical History:  Diagnosis Date  . Depression   . Dysrhythmia    heart palipations  . GERD (gastroesophageal reflux disease)   . HTN (hypertension)   . Hypercholesteremia   . PONV (postoperative nausea and vomiting)     Past Surgical History:  Procedure Laterality Date  . APPENDECTOMY    . ARTHROSCOPIC REPAIR ACL  05/2008   Left knee  . CARPAL TUNNEL RELEASE Right 07/14/2017   Procedure: CARPAL TUNNEL RELEASE;  Surgeon: Carole Civil, MD;  Location: [facility];  Service: Orthopedics;  Laterality: Right;  . CHOLECYSTECTOMY    . DILATION AND CURETTAGE OF UTERUS    . DORSAL COMPARTMENT RELEASE Right 07/14/2017   Procedure: RELEASE DORSAL COMPARTMENT (DEQUERVAIN);  Surgeon: Carole Civil, MD;  Location: [facility];  Service: Orthopedics;  Laterality: Right;  . HYSTEROSCOPY W/D&C N/A 12/25/2013   Procedure: DILATATION AND CURETTAGE /HYSTEROSCOPY;  Surgeon: Gus Height, MD;  Location: [facility];  Service: Gynecology;  Laterality: N/A;  . TUBAL LIGATION      There were no vitals filed for this visit.   Subjective Assessment - 04/03/18 1228    Subjective  The patient presents to the clinic today with c/o left shoulder  and neck pain that has been ongoing for several months.  She had a recent injection in her left shoulder and it was very helpful. Her pain is moderate in nature increasing with movement.       Pertinent History  CTS; left knee surgery.    Patient Stated Goals  Get out of pain.    Pain Onset  More than a month ago    Multiple Pain Sites  Yes    Pain Score  3    Pain Location  Neck    Pain Orientation  Left    Pain Descriptors / Indicators  Sharp    Pain Type  Acute pain    Pain Onset  More than a month ago    Pain Frequency  Intermittent    Aggravating Factors   See above.    Pain Relieving Factors  See above.         [facility] PT Assessment - 04/03/18 0001      Assessment   Medical Diagnosis  Neck pain; left shoulder pain.    Referring Provider (PT)  Arther Abbott MD.    Onset Date/Surgical Date  --   ~3 months.     Precautions   Precautions  None      Restrictions   Weight Bearing Restrictions  No      Balance Screen  Has the patient fallen in the past 6 months  No    Has the patient had a decrease in activity level because of a fear of falling?   No    Is the patient reluctant to leave their home because of a fear of falling?   No      Home Environment   Living Environment  Private residence      Prior Function   Level of Independence  Independent      Observation/Other Assessments   Focus on Therapeutic Outcomes (FOTO)   52% limitation.      Posture/Postural Control   Posture/Postural Control  Postural limitations      ROM / Strength   AROM / PROM / Strength  AROM;Strength      AROM   Overall AROM Comments  Right active cervical rotation= 69 degrees, left= 68 degrees and bilateral SBing= 20 degrees.  Active left shoulder flexion against gravity= 150 degrees; ER= 90 degrees and behind back to T12.      Strength   Overall Strength Comments  Left shoulder abduction= 4 to 4+/5; left shoulder IR/ER= 4+/5.      Palpation   Palpation comment  Patient tender to  palption at left mid-cervical region and left acromial ridge of shoulder.      Special Tests   Other special tests  Bilateral UE DTR's= 1+/4+.  No complaints of pain with left shoulder Impingement testing.        Ambulation/Gait   Gait Comments  WNL.                Objective measurements completed on examination: See above findings.      [facility] Adult PT Treatment/Exercise - 04/03/18 0001      Modalities   Modalities  Electrical Stimulation      Electrical Stimulation   Electrical Stimulation Location  Left shoulder.    Electrical Stimulation Action  IFC    Electrical Stimulation Parameters  80-150 Hz x 20 minutes.    Electrical Stimulation Goals  Pain             PT Education - 04/03/18 1205    Education Details  Chin tuck and cervical extension.  Short duration ice massage.    Person(s) Educated  Patient;Child(ren)    Methods  Explanation    Comprehension  Verbalized understanding       PT Short Term Goals - 04/03/18 1253      PT SHORT TERM GOAL #1   Title  STG's=LTG's.        PT Long Term Goals - 04/03/18 1253      PT LONG TERM GOAL #1   Title  Independent with a HEP.    Time  6    Period  Weeks    Status  New      PT LONG TERM GOAL #2   Title  Increase active cervical rotation to 75 degrees+ so patient can turn head more easily while driving.    Time  6    Period  Weeks    Status  New      PT LONG TERM GOAL #3   Title  Perform ADL's with left shoulder pain not > 2-3/10.    Time  6    Period  Weeks    Status  New             Plan - 04/03/18 1248    Clinical Impression Statement  The patient presents to OPPT with  c/o left sided neck pain and left shoulder pain.  She had a recent injection in her left shoulder which was very helpful.  Overall, her neck and left shoulder range of motion is quite good.  She c/o palpable pain over left mid-cervical region and left shoulder acromial ridge.  She also c/o referred pain to her left middle  deltoid and occasionally to her left elbow.      History and Personal Factors relevant to plan of care:  CTS; left knee surgery.    Clinical Presentation  Stable    Clinical Decision Making  Low    Rehab Potential  Excellent    PT Frequency  2x / week    PT Duration  6 weeks    PT Treatment/Interventions  ADLs/Self Care Home Management;Electrical Stimulation;Iontophoresis 4mg /ml Dexamethasone;Moist Heat;Traction;Ultrasound;Therapeutic exercise;Therapeutic activities;Patient/family education;Passive range of motion;Manual techniques;Dry needling    PT Next Visit Plan  Ionto to left shoulder acromial ridge oncer cert is signed; Combo e'stim/U/S to left side of neck and shoulder; chin tucks and cervical extension, scapular exercises; left RW4, left full can; electrical stimulation.    Consulted and Agree with Plan of Care  Patient       Patient will benefit from skilled therapeutic intervention in order to improve the following deficits and impairments:  Pain, Decreased range of motion, Decreased activity tolerance, Decreased strength  Visit Diagnosis: Cervicalgia - Plan: PT plan of care cert/re-cert  Acute pain of left shoulder - Plan: PT plan of care cert/re-cert     Problem List Patient Active Problem List   Diagnosis Date Noted  . S/P carpal tunnel and DeQuervains release right 07/14/17 07/20/2017  . Carpal tunnel syndrome of right wrist   . De Quervain's disease (tenosynovitis)   . Palpitations 06/28/2013  . Mixed hyperlipidemia 06/28/2013  . Obesity, unspecified 06/28/2013    APPLE[place], [place] MPT 04/03/2018, 12:56 PM  [facility] [address] [place], [place], [address] Phone: [phone]   Fax:  [phone]  Name: Donna Barron MRN: [id number] Date of Birth: 12/24/1940

## 2018-04-07 ENCOUNTER — Ambulatory Visit: Payer: Medicare Other | Admitting: Physical Therapy

## 2018-04-07 ENCOUNTER — Encounter: Payer: Self-pay | Admitting: Physical Therapy

## 2018-04-07 DIAGNOSIS — M542 Cervicalgia: Secondary | ICD-10-CM | POA: Diagnosis not present

## 2018-04-07 DIAGNOSIS — M25512 Pain in left shoulder: Secondary | ICD-10-CM

## 2018-04-07 NOTE — Therapy (Signed)
[facility] [place], [place], [address] Phone: [phone]   Fax:  [phone]  Physical Therapy Treatment  Patient Details  Name: Donna Barron MRN: [id number] Date of Birth: [birth year] Referring Provider (PT): Arther Abbott MD.   Encounter Date: 04/07/2018  PT End of Session - 04/07/18 1307    Visit Number  2    Number of Visits  12    Date for PT Re-Evaluation  07/03/18    Authorization Type  FOTO AT LEAST EVERY 5TH VISIT, 10TH VISIT PROGRESS NOTE AND KX MODIFIER AFTER THE 15 VISIT.    PT Start Time  1035    PT Stop Time  1110    PT Time Calculation (min)  35 min    Activity Tolerance  Patient tolerated treatment well    Behavior During Therapy  WFL for tasks assessed/performed       Past Medical History:  Diagnosis Date  . Depression   . Dysrhythmia    heart palipations  . GERD (gastroesophageal reflux disease)   . HTN (hypertension)   . Hypercholesteremia   . PONV (postoperative nausea and vomiting)     Past Surgical History:  Procedure Laterality Date  . APPENDECTOMY    . ARTHROSCOPIC REPAIR ACL  05/2008   Left knee  . CARPAL TUNNEL RELEASE Right 07/14/2017   Procedure: CARPAL TUNNEL RELEASE;  Surgeon: Carole Civil, MD;  Location: [facility];  Service: Orthopedics;  Laterality: Right;  . CHOLECYSTECTOMY    . DILATION AND CURETTAGE OF UTERUS    . DORSAL COMPARTMENT RELEASE Right 07/14/2017   Procedure: RELEASE DORSAL COMPARTMENT (DEQUERVAIN);  Surgeon: Carole Civil, MD;  Location: [facility];  Service: Orthopedics;  Laterality: Right;  . HYSTEROSCOPY W/D&C N/A 12/25/2013   Procedure: DILATATION AND CURETTAGE /HYSTEROSCOPY;  Surgeon: Gus Height, MD;  Location: [facility];  Service: Gynecology;  Laterality: N/A;  . TUBAL LIGATION      There were no vitals filed for this visit.  Subjective Assessment - 04/07/18 1308    Subjective  Treatment helped.    Currently in Pain?  Yes    Pain Score  2     Pain Location  Shoulder    Pain Orientation  Left    Pain Descriptors / Indicators  Aching    Pain Type  Acute pain    Pain Onset  More than a month ago    Pain Score  3    Pain Location  Neck    Pain Orientation  Left    Pain Descriptors / Indicators  Sharp    Pain Type  Acute pain    Pain Onset  More than a month ago                       [facility] Adult PT Treatment/Exercise - 04/07/18 0001      Modalities   Modalities  Electrical Stimulation;Vasopneumatic      Electrical Stimulation   Electrical Stimulation Location  Left shoulder.    Electrical Stimulation Action  IFC    Electrical Stimulation Parameters  80-150 Hz x 15 minutes.    Electrical Stimulation Goals  Pain      Vasopneumatic   Number Minutes Vasopneumatic   15 minutes    Vasopnuematic Location   --   Left shoulder.   Vasopneumatic Pressure  Low      Manual Therapy   Manual Therapy  Soft tissue mobilization    Soft tissue mobilization  STW/M x 8 minutes to affected left cervical region.               PT Short Term Goals - 04/03/18 1253      PT SHORT TERM GOAL #1   Title  STG's=LTG's.        PT Long Term Goals - 04/03/18 1253      PT LONG TERM GOAL #1   Title  Independent with a HEP.    Time  6    Period  Weeks    Status  New      PT LONG TERM GOAL #2   Title  Increase active cervical rotation to 75 degrees+ so patient can turn head more easily while driving.    Time  6    Period  Weeks    Status  New      PT LONG TERM GOAL #3   Title  Perform ADL's with left shoulder pain not > 2-3/10.    Time  6    Period  Weeks    Status  New            Plan - 04/07/18 1313    Clinical Impression Statement  Good response to treatment today.  Patient found to have trigger points over left UT and levator scapulae.  She felt good after treatment.    PT Treatment/Interventions  ADLs/Self Care Home Management;Electrical Stimulation;Iontophoresis 4mg /ml Dexamethasone;Moist  Heat;Traction;Ultrasound;Therapeutic exercise;Therapeutic activities;Patient/family education;Passive range of motion;Manual techniques;Dry needling    PT Next Visit Plan  Ionto to left shoulder acromial ridge oncer cert is signed; Combo e'stim/U/S to left side of neck and shoulder; chin tucks and cervical extension, scapular exercises; left RW4, left full can; electrical stimulation.    Consulted and Agree with Plan of Care  Patient       Patient will benefit from skilled therapeutic intervention in order to improve the following deficits and impairments:  Pain, Decreased range of motion, Decreased activity tolerance, Decreased strength  Visit Diagnosis: Cervicalgia  Acute pain of left shoulder     Problem List Patient Active Problem List   Diagnosis Date Noted  . S/P carpal tunnel and DeQuervains release right 07/14/17 07/20/2017  . Carpal tunnel syndrome of right wrist   . De Quervain's disease (tenosynovitis)   . Palpitations 06/28/2013  . Mixed hyperlipidemia 06/28/2013  . Obesity, unspecified 06/28/2013    Donna Barron, [place] MPT 04/07/2018, 1:15 PM  [facility] [address] [place], [place], [address] Phone: [phone]   Fax:  [phone]  Name: Donna Barron MRN: [id number] Date of Birth: [birth year]

## 2018-04-19 ENCOUNTER — Encounter: Payer: Self-pay | Admitting: Physical Therapy

## 2018-04-19 ENCOUNTER — Ambulatory Visit: Payer: Medicare Other | Attending: Orthopedic Surgery | Admitting: Physical Therapy

## 2018-04-19 DIAGNOSIS — M25512 Pain in left shoulder: Secondary | ICD-10-CM | POA: Diagnosis present

## 2018-04-19 DIAGNOSIS — M542 Cervicalgia: Secondary | ICD-10-CM | POA: Insufficient documentation

## 2018-04-19 NOTE — Therapy (Signed)
[facility] [place], [place], [address] Phone: [phone]   Fax:  [phone]  Physical Therapy Treatment  Patient Details  Name: Donna Barron MRN: [id number] Date of Birth: [birth year] Referring Provider (PT): Arther Abbott MD.   Encounter Date: 04/19/2018  PT End of Session - 04/19/18 1157    Visit Number  3    Number of Visits  12    Date for PT Re-Evaluation  07/03/18    Authorization Type  FOTO AT LEAST EVERY 5TH VISIT, 10TH VISIT PROGRESS NOTE AND KX MODIFIER AFTER THE 15 VISIT.    PT Start Time  1124    PT Stop Time  1211    PT Time Calculation (min)  47 min    Activity Tolerance  Patient tolerated treatment well    Behavior During Therapy  WFL for tasks assessed/performed       Past Medical History:  Diagnosis Date  . Depression   . Dysrhythmia    heart palipations  . GERD (gastroesophageal reflux disease)   . HTN (hypertension)   . Hypercholesteremia   . PONV (postoperative nausea and vomiting)     Past Surgical History:  Procedure Laterality Date  . APPENDECTOMY    . ARTHROSCOPIC REPAIR ACL  05/2008   Left knee  . CARPAL TUNNEL RELEASE Right 07/14/2017   Procedure: CARPAL TUNNEL RELEASE;  Surgeon: Carole Civil, MD;  Location: [facility];  Service: Orthopedics;  Laterality: Right;  . CHOLECYSTECTOMY    . DILATION AND CURETTAGE OF UTERUS    . DORSAL COMPARTMENT RELEASE Right 07/14/2017   Procedure: RELEASE DORSAL COMPARTMENT (DEQUERVAIN);  Surgeon: Carole Civil, MD;  Location: [facility];  Service: Orthopedics;  Laterality: Right;  . HYSTEROSCOPY W/D&C N/A 12/25/2013   Procedure: DILATATION AND CURETTAGE /HYSTEROSCOPY;  Surgeon: Gus Height, MD;  Location: [facility];  Service: Gynecology;  Laterality: N/A;  . TUBAL LIGATION      There were no vitals filed for this visit.  Subjective Assessment - 04/19/18 1123    Subjective  Reports that past treatment helped but had a lot of pain yesterday  (8/10).    Pertinent History  CTS; left knee surgery.    Patient Stated Goals  Get out of pain.    Currently in Pain?  Yes    Pain Score  --   No pain score provided   Pain Location  Neck    Pain Orientation  Left    Pain Descriptors / Indicators  Discomfort    Pain Type  Acute pain         [facility] PT Assessment - 04/19/18 0001      Assessment   Medical Diagnosis  Neck pain; left shoulder pain.    Referring Provider (PT)  Arther Abbott MD.      Precautions   Precautions  None      Restrictions   Weight Bearing Restrictions  No                   [facility] Adult PT Treatment/Exercise - 04/19/18 0001      Modalities   Modalities  Electrical Stimulation;Iontophoresis;Vasopneumatic;Ultrasound      Theme park manager  L shoulder/ UT    Location manager Parameters  80-150 hz x15 min    Electrical Stimulation Goals  Pain;Tone      Ultrasound   Ultrasound Location  L UT    Ultrasound  Parameters  Combo 1.5 w/cm2, 100%, 1 mhz x10 min    Ultrasound Goals  Pain      Iontophoresis   Type of Iontophoresis  Dexamethasone    Location  L acromian process    Dose  1 ml    Time  4 hour patch      Vasopneumatic   Number Minutes Vasopneumatic   15 minutes    Vasopnuematic Location   Shoulder    Vasopneumatic Pressure  Low      Manual Therapy   Manual Therapy  Soft tissue mobilization    Soft tissue mobilization  STW to L UT, cervical paraspinasl to reduce tightness                PT Short Term Goals - 04/03/18 1253      PT SHORT TERM GOAL #1   Title  STG's=LTG's.        PT Long Term Goals - 04/03/18 1253      PT LONG TERM GOAL #1   Title  Independent with a HEP.    Time  6    Period  Weeks    Status  New      PT LONG TERM GOAL #2   Title  Increase active cervical rotation to 75 degrees+ so patient can turn head more easily while driving.    Time  6    Period   Weeks    Status  New      PT LONG TERM GOAL #3   Title  Perform ADL's with left shoulder pain not > 2-3/10.    Time  6    Period  Weeks    Status  New            Plan - 04/19/18 1158    Clinical Impression Statement  Patient presented in clinic today with reports of L cervical pain yesterday. Patient presented with palpable tightness along L UT region which she was also very tender to during manual therapy. Patient also sensitive to palpation along medial UT, mid trap region. Normal modalities response noted following removal of the modalities. Patient thoroughly educated regarding iontophoresis and provided handout. Patch donned to L acromian process.    Rehab Potential  Excellent    PT Frequency  2x / week    PT Duration  6 weeks    PT Treatment/Interventions  ADLs/Self Care Home Management;Electrical Stimulation;Iontophoresis 4mg /ml Dexamethasone;Moist Heat;Traction;Ultrasound;Therapeutic exercise;Therapeutic activities;Patient/family education;Passive range of motion;Manual techniques;Dry needling    PT Next Visit Plan  Ionto to left shoulder acromial ridge oncer cert is signed; Combo e'stim/U/S to left side of neck and shoulder; chin tucks and cervical extension, scapular exercises; left RW4, left full can; electrical stimulation.    Consulted and Agree with Plan of Care  Patient       Patient will benefit from skilled therapeutic intervention in order to improve the following deficits and impairments:  Pain, Decreased range of motion, Decreased activity tolerance, Decreased strength  Visit Diagnosis: Cervicalgia  Acute pain of left shoulder     Problem List Patient Active Problem List   Diagnosis Date Noted  . S/P carpal tunnel and DeQuervains release right 07/14/17 07/20/2017  . Carpal tunnel syndrome of right wrist   . De Quervain's disease (tenosynovitis)   . Palpitations 06/28/2013  . Mixed hyperlipidemia 06/28/2013  . Obesity, unspecified 06/28/2013    Standley Brooking, PTA 04/19/2018, 12:14 PM  [facility] [address] [place], [place], [address]  Phone: [phone]   Fax:  [phone]  Name: Donna Barron MRN: [id number] Date of Birth: Oct 14, 1940

## 2018-04-25 ENCOUNTER — Ambulatory Visit: Payer: Medicare Other | Admitting: Physical Therapy

## 2018-04-26 ENCOUNTER — Ambulatory Visit: Payer: Medicare Other | Admitting: Physical Therapy

## 2018-04-26 DIAGNOSIS — M542 Cervicalgia: Secondary | ICD-10-CM

## 2018-04-26 DIAGNOSIS — M25512 Pain in left shoulder: Secondary | ICD-10-CM

## 2018-04-26 NOTE — Therapy (Signed)
[facility] [place], [place], [address] Phone: [phone]   Fax:  [phone]  Physical Therapy Treatment  Patient Details  Name: Donna Barron MRN: [id number] Date of Birth: May 16, 1941 Referring Provider (PT): Arther Abbott MD.   Encounter Date: 04/26/2018  PT End of Session - 04/26/18 1347    Visit Number  4    Number of Visits  12    Date for PT Re-Evaluation  07/03/18    Authorization Type  FOTO AT LEAST EVERY 5TH VISIT, 10TH VISIT PROGRESS NOTE AND KX MODIFIER AFTER THE 15 VISIT.    PT Start Time  1350    PT Stop Time  1438    PT Time Calculation (min)  48 min    Activity Tolerance  Patient tolerated treatment well    Behavior During Therapy  WFL for tasks assessed/performed       Past Medical History:  Diagnosis Date  . Depression   . Dysrhythmia    heart palipations  . GERD (gastroesophageal reflux disease)   . HTN (hypertension)   . Hypercholesteremia   . PONV (postoperative nausea and vomiting)     Past Surgical History:  Procedure Laterality Date  . APPENDECTOMY    . ARTHROSCOPIC REPAIR ACL  05/2008   Left knee  . CARPAL TUNNEL RELEASE Right 07/14/2017   Procedure: CARPAL TUNNEL RELEASE;  Surgeon: Carole Civil, MD;  Location: [facility];  Service: Orthopedics;  Laterality: Right;  . CHOLECYSTECTOMY    . DILATION AND CURETTAGE OF UTERUS    . DORSAL COMPARTMENT RELEASE Right 07/14/2017   Procedure: RELEASE DORSAL COMPARTMENT (DEQUERVAIN);  Surgeon: Carole Civil, MD;  Location: [facility];  Service: Orthopedics;  Laterality: Right;  . HYSTEROSCOPY W/D&C N/A 12/25/2013   Procedure: DILATATION AND CURETTAGE /HYSTEROSCOPY;  Surgeon: Gus Height, MD;  Location: [facility];  Service: Gynecology;  Laterality: N/A;  . TUBAL LIGATION      There were no vitals filed for this visit.  Subjective Assessment - 04/26/18 1349    Subjective  Reports pull in posterior L shoulder with horizontal adduction.  Reports L shoulder and neck pain today.    Pertinent History  CTS; left knee surgery.    Patient Stated Goals  Get out of pain.    Currently in Pain?  Yes    Pain Score  6     Pain Location  Neck    Pain Orientation  Left    Pain Descriptors / Indicators  Tightness    Pain Type  Acute pain    Pain Onset  More than a month ago    Pain Score  6    Pain Location  Shoulder    Pain Orientation  Left    Pain Descriptors / Indicators  Aching    Pain Type  Acute pain    Pain Onset  More than a month ago         [facility] PT Assessment - 04/26/18 0001      Assessment   Medical Diagnosis  Neck pain; left shoulder pain.    Referring Provider (PT)  Arther Abbott MD.    Next MD Visit  05/2018      Precautions   Precautions  None      Restrictions   Weight Bearing Restrictions  No                   [facility] Adult PT Treatment/Exercise - 04/26/18 0001  Modalities   Modalities  Electrical Stimulation;Moist Heat;Ultrasound;Iontophoresis      Moist Heat Therapy   Number Minutes Moist Heat  15 Minutes    Moist Heat Location  Shoulder;Cervical      Electrical Stimulation   Electrical Stimulation Location  L posterior shoulder/ UT    Electrical Stimulation Action  IFC    Electrical Stimulation Parameters  80-150 hz x15 min    Electrical Stimulation Goals  Pain;Tone      Ultrasound   Ultrasound Location  L posterior shoulder    Ultrasound Parameters  1.5 w/cm2, 100%, 1 mhz x10 min    Ultrasound Goals  Pain      Iontophoresis   Type of Iontophoresis  Dexamethasone    Location  L posterior shoulder    Dose  1 ml    Time  4 hour patch      Manual Therapy   Manual Therapy  Soft tissue mobilization    Soft tissue mobilization  STW to L UT, infraspinatus, teres minor, posterior deltoid to reduce tightness                PT Short Term Goals - 04/03/18 1253      PT SHORT TERM GOAL #1   Title  STG's=LTG's.        PT Long Term Goals - 04/03/18 1253       PT LONG TERM GOAL #1   Title  Independent with a HEP.    Time  6    Period  Weeks    Status  New      PT LONG TERM GOAL #2   Title  Increase active cervical rotation to 75 degrees+ so patient can turn head more easily while driving.    Time  6    Period  Weeks    Status  New      PT LONG TERM GOAL #3   Title  Perform ADL's with left shoulder pain not > 2-3/10.    Time  6    Period  Weeks    Status  New            Plan - 04/26/18 1447    Clinical Impression Statement  Patient presented in clinic with continued reports of L shoulder and L cervical pain. Patient was very tender with palpation and manual therapy to L posterior shoulder (infraspinatus, teres minor) as well as L UT. Normal modalities response noted following removal of all modalities. Iontophoresis patch donned to more posterior shoulder specifically over infraspinatus and teres minor tendons. Patient advised again to remove patch in four hours upon end of treatment.    Rehab Potential  Excellent    PT Frequency  2x / week    PT Duration  6 weeks    PT Treatment/Interventions  ADLs/Self Care Home Management;Electrical Stimulation;Iontophoresis 4mg /ml Dexamethasone;Moist Heat;Traction;Ultrasound;Therapeutic exercise;Therapeutic activities;Patient/family education;Passive range of motion;Manual techniques;Dry needling    PT Next Visit Plan  Ionto to left shoulder acromial ridge oncer cert is signed; Combo e'stim/U/S to left side of neck and shoulder; chin tucks and cervical extension, scapular exercises; left RW4, left full can; electrical stimulation.    Consulted and Agree with Plan of Care  Patient       Patient will benefit from skilled therapeutic intervention in order to improve the following deficits and impairments:  Pain, Decreased range of motion, Decreased activity tolerance, Decreased strength  Visit Diagnosis: Cervicalgia  Acute pain of left shoulder     Problem List Patient  Active Problem List    Diagnosis Date Noted  . S/P carpal tunnel and DeQuervains release right 07/14/17 07/20/2017  . Carpal tunnel syndrome of right wrist   . De Quervain's disease (tenosynovitis)   . Palpitations 06/28/2013  . Mixed hyperlipidemia 06/28/2013  . Obesity, unspecified 06/28/2013    Standley Brooking, PTA 04/26/2018, 2:52 PM  [facility] [address] [place], [place], [address] Phone: [phone]   Fax:  [phone]  Name: NOHEALANI MEDINGER MRN: [id number] Date of Birth: 07/09/1940

## 2018-04-27 ENCOUNTER — Ambulatory Visit: Payer: Medicare Other | Admitting: Physical Therapy

## 2018-04-27 DIAGNOSIS — M542 Cervicalgia: Secondary | ICD-10-CM

## 2018-04-27 DIAGNOSIS — M25512 Pain in left shoulder: Secondary | ICD-10-CM

## 2018-04-27 NOTE — Therapy (Signed)
[facility] [place], [place], [address] Phone: [phone]   Fax:  [phone]  Physical Therapy Treatment  Patient Details  Name: Donna Barron MRN: [id number] Date of Birth: Sep 24, 1940 Referring Provider (PT): Arther Abbott MD.   Encounter Date: 04/27/2018  PT End of Session - 04/27/18 1401    Visit Number  5    Number of Visits  12    Date for PT Re-Evaluation  07/03/18    Authorization Type  FOTO AT LEAST EVERY 5TH VISIT, 10TH VISIT PROGRESS NOTE AND KX MODIFIER AFTER THE 15 VISIT.    PT Start Time  0100    PT Stop Time  0142    PT Time Calculation (min)  42 min    Activity Tolerance  Patient tolerated treatment well    Behavior During Therapy  WFL for tasks assessed/performed       Past Medical History:  Diagnosis Date  . Depression   . Dysrhythmia    heart palipations  . GERD (gastroesophageal reflux disease)   . HTN (hypertension)   . Hypercholesteremia   . PONV (postoperative nausea and vomiting)     Past Surgical History:  Procedure Laterality Date  . APPENDECTOMY    . ARTHROSCOPIC REPAIR ACL  05/2008   Left knee  . CARPAL TUNNEL RELEASE Right 07/14/2017   Procedure: CARPAL TUNNEL RELEASE;  Surgeon: Carole Civil, MD;  Location: [facility];  Service: Orthopedics;  Laterality: Right;  . CHOLECYSTECTOMY    . DILATION AND CURETTAGE OF UTERUS    . DORSAL COMPARTMENT RELEASE Right 07/14/2017   Procedure: RELEASE DORSAL COMPARTMENT (DEQUERVAIN);  Surgeon: Carole Civil, MD;  Location: [facility];  Service: Orthopedics;  Laterality: Right;  . HYSTEROSCOPY W/D&C N/A 12/25/2013   Procedure: DILATATION AND CURETTAGE /HYSTEROSCOPY;  Surgeon: Gus Height, MD;  Location: [facility];  Service: Gynecology;  Laterality: N/A;  . TUBAL LIGATION      There were no vitals filed for this visit.  Subjective Assessment - 04/27/18 1334    Subjective  Treatments seem to be helping.    Patient Stated Goals  Get out of  pain.    Currently in Pain?  Yes    Pain Score  5     Pain Location  Neck    Pain Orientation  Left    Pain Descriptors / Indicators  Tightness    Pain Type  Acute pain    Pain Onset  More than a month ago    Pain Score  5    Pain Location  Shoulder    Pain Orientation  Left    Pain Descriptors / Indicators  Aching    Pain Type  Acute pain    Pain Onset  More than a month ago                       [facility] Adult PT Treatment/Exercise - 04/27/18 0001      Electrical Stimulation   Electrical Stimulation Location  Left posterior shoulder.    Electrical Stimulation Action  IFC    Electrical Stimulation Parameters  80-150 hz x 15 minutes.    Electrical Stimulation Goals  Tone;Pain      Ultrasound   Ultrasound Location  Left UT, post cuff.    Ultrasound Parameters  1.50 W/CM combo e'stim/u/S x 8 minutes.    Ultrasound Goals  Pain      Iontophoresis   Type of Iontophoresis  Dexamethasone  Location  Left post shoulder.    Dose  4 mg/ml.    Time  8      Manual Therapy   Manual Therapy  Soft tissue mobilization    Soft tissue mobilization  STW/M x 8 minutes to left post cuff and left UT x 8 minutes.               PT Short Term Goals - 04/03/18 1253      PT SHORT TERM GOAL #1   Title  STG's=LTG's.        PT Long Term Goals - 04/03/18 1253      PT LONG TERM GOAL #1   Title  Independent with a HEP.    Time  6    Period  Weeks    Status  New      PT LONG TERM GOAL #2   Title  Increase active cervical rotation to 75 degrees+ so patient can turn head more easily while driving.    Time  6    Period  Weeks    Status  New      PT LONG TERM GOAL #3   Title  Perform ADL's with left shoulder pain not > 2-3/10.    Time  6    Period  Weeks    Status  New            Plan - 04/27/18 1358    Clinical Impression Statement  Patient reporting improvement.  Continued increased tone over left UT post cuff region.  She enjoyed combo e'stim/U/S today.       PT Frequency  2x / week    PT Duration  6 weeks    PT Treatment/Interventions  ADLs/Self Care Home Management;Electrical Stimulation;Iontophoresis 4mg /ml Dexamethasone;Moist Heat;Traction;Ultrasound;Therapeutic exercise;Therapeutic activities;Patient/family education;Passive range of motion;Manual techniques;Dry needling       Patient will benefit from skilled therapeutic intervention in order to improve the following deficits and impairments:  Pain, Decreased range of motion, Decreased activity tolerance, Decreased strength  Visit Diagnosis: Cervicalgia  Acute pain of left shoulder     Problem List Patient Active Problem List   Diagnosis Date Noted  . S/P carpal tunnel and DeQuervains release right 07/14/17 07/20/2017  . Carpal tunnel syndrome of right wrist   . De Quervain's disease (tenosynovitis)   . Palpitations 06/28/2013  . Mixed hyperlipidemia 06/28/2013  . Obesity, unspecified 06/28/2013    Ramone Gander, [place] MPT 04/27/2018, 2:02 PM  [facility] [address] [place], [place], [address] Phone: [phone]   Fax:  [phone]  Name: Donna Barron MRN: [id number] Date of Birth: 20-May-1941

## 2018-05-02 ENCOUNTER — Ambulatory Visit: Payer: Medicare Other | Admitting: *Deleted

## 2018-05-02 ENCOUNTER — Encounter: Payer: Self-pay | Admitting: *Deleted

## 2018-05-02 DIAGNOSIS — M542 Cervicalgia: Secondary | ICD-10-CM | POA: Diagnosis not present

## 2018-05-02 DIAGNOSIS — M25512 Pain in left shoulder: Secondary | ICD-10-CM

## 2018-05-02 NOTE — Therapy (Signed)
[facility] [place], [place], [address] Phone: [phone]   Fax:  [phone]  Physical Therapy Treatment  Patient Details  Name: Donna Barron MRN: [id number] Date of Birth: December 13, 1940 Referring Provider (PT): Arther Abbott MD.   Encounter Date: 05/02/2018  PT End of Session - 05/02/18 1527    Visit Number  6    Number of Visits  12    Date for PT Re-Evaluation  07/03/18    Authorization Type  FOTO AT LEAST EVERY 5TH VISIT, 10TH VISIT PROGRESS NOTE AND KX MODIFIER AFTER THE 15 VISIT.    PT Start Time  1430    PT Stop Time  1517    PT Time Calculation (min)  47 min       Past Medical History:  Diagnosis Date  . Depression   . Dysrhythmia    heart palipations  . GERD (gastroesophageal reflux disease)   . HTN (hypertension)   . Hypercholesteremia   . PONV (postoperative nausea and vomiting)     Past Surgical History:  Procedure Laterality Date  . APPENDECTOMY    . ARTHROSCOPIC REPAIR ACL  05/2008   Left knee  . CARPAL TUNNEL RELEASE Right 07/14/2017   Procedure: CARPAL TUNNEL RELEASE;  Surgeon: Carole Civil, MD;  Location: [facility];  Service: Orthopedics;  Laterality: Right;  . CHOLECYSTECTOMY    . DILATION AND CURETTAGE OF UTERUS    . DORSAL COMPARTMENT RELEASE Right 07/14/2017   Procedure: RELEASE DORSAL COMPARTMENT (DEQUERVAIN);  Surgeon: Carole Civil, MD;  Location: [facility];  Service: Orthopedics;  Laterality: Right;  . HYSTEROSCOPY W/D&C N/A 12/25/2013   Procedure: DILATATION AND CURETTAGE /HYSTEROSCOPY;  Surgeon: Gus Height, MD;  Location: [facility];  Service: Gynecology;  Laterality: N/A;  . TUBAL LIGATION      There were no vitals filed for this visit.  Subjective Assessment - 05/02/18 1433    Subjective  Not sure if Rxs are helping. Pain is the most when doing things with LT shldr. 8/10    Pertinent History  CTS; left knee surgery.    Patient Stated Goals  Get out of pain.    Currently  in Pain?  Yes    Pain Score  4     Pain Location  Shoulder    Pain Orientation  Left    Pain Descriptors / Indicators  Tightness    Pain Type  Acute pain    Pain Onset  More than a month ago                       [facility] Adult PT Treatment/Exercise - 05/02/18 0001      Exercises   Exercises  Shoulder      Shoulder Exercises: Standing   External Rotation  Strengthening;Left;10 reps;20 reps    Theraband Level (Shoulder External Rotation)  Level 1 (Yellow)    Internal Rotation  Strengthening;Left;10 reps;20 reps    Theraband Level (Shoulder Internal Rotation)  Level 1 (Yellow)      Modalities   Modalities  Electrical Stimulation;Moist Heat;Ultrasound;Iontophoresis;Vasopneumatic      Acupuncturist Location  Left posterior shoulder. IFC x 15 mins 80-150hz     Electrical Stimulation Goals  Tone;Pain      Ultrasound   Ultrasound Location  LT Utrap    Ultrasound Parameters  1.5 w/cm2 x 10 mins    Ultrasound Goals  Pain      Iontophoresis   Type  of Iontophoresis  Dexamethasone    Location  4/6   Left post shoulder.    Dose  4 mg/ml.    Time  8      Vasopneumatic   Number Minutes Vasopneumatic   15 minutes    Vasopnuematic Location   Shoulder    Vasopneumatic Pressure  Low               PT Short Term Goals - 04/03/18 1253      PT SHORT TERM GOAL #1   Title  STG's=LTG's.        PT Long Term Goals - 04/03/18 1253      PT LONG TERM GOAL #1   Title  Independent with a HEP.    Time  6    Period  Weeks    Status  New      PT LONG TERM GOAL #2   Title  Increase active cervical rotation to 75 degrees+ so patient can turn head more easily while driving.    Time  6    Period  Weeks    Status  New      PT LONG TERM GOAL #3   Title  Perform ADL's with left shoulder pain not > 2-3/10.    Time  6    Period  Weeks    Status  New            Plan - 05/02/18 1549    Clinical Impression Statement  Pt arrived  today with some increased discomfort in LT shldr and not shure PT is helping. IR/ER with yellow tband started today and given for HEP. She did well with therex , but did have discomfort in upper arm while performing. Normal modality response today. To MD 2 weeks.    Clinical Presentation  Stable    Clinical Decision Making  Low    Rehab Potential  Excellent    PT Frequency  2x / week    PT Duration  6 weeks    PT Treatment/Interventions  ADLs/Self Care Home Management;Electrical Stimulation;Iontophoresis 4mg /ml Dexamethasone;Moist Heat;Traction;Ultrasound;Therapeutic exercise;Therapeutic activities;Patient/family education;Passive range of motion;Manual techniques;Dry needling    PT Next Visit Plan  Ionto to left shoulder acromial ridge oncer cert is signed; Combo e'stim/U/S to left side of neck and shoulder; chin tucks and cervical extension, scapular exercises; left RW4, left full can; electrical stimulation.       Patient will benefit from skilled therapeutic intervention in order to improve the following deficits and impairments:  Pain, Decreased range of motion, Decreased activity tolerance, Decreased strength  Visit Diagnosis: Cervicalgia  Acute pain of left shoulder     Problem List Patient Active Problem List   Diagnosis Date Noted  . S/P carpal tunnel and DeQuervains release right 07/14/17 07/20/2017  . Carpal tunnel syndrome of right wrist   . De Quervain's disease (tenosynovitis)   . Palpitations 06/28/2013  . Mixed hyperlipidemia 06/28/2013  . Obesity, unspecified 06/28/2013    ,CHRIS, PTA 05/02/2018, 6:11 PM  [facility] [place], [place], [address] Phone: [phone]   Fax:  [phone]  Name: Donna Barron MRN: [id number] Date of Birth: [birth year]

## 2018-05-04 ENCOUNTER — Ambulatory Visit: Payer: Medicare Other | Admitting: Physical Therapy

## 2018-05-04 DIAGNOSIS — M542 Cervicalgia: Secondary | ICD-10-CM | POA: Diagnosis not present

## 2018-05-04 DIAGNOSIS — M25512 Pain in left shoulder: Secondary | ICD-10-CM

## 2018-05-04 NOTE — Therapy (Signed)
[facility] [place], [place], [address] Phone: [phone]   Fax:  [phone]  Physical Therapy Treatment  Patient Details  Name: Donna Barron MRN: [id number] Date of Birth: Jan 07, 1941 Referring Provider (PT): Arther Abbott MD.   Encounter Date: 05/04/2018  PT End of Session - 05/04/18 1655    Visit Number  7    Number of Visits  12    Date for PT Re-Evaluation  07/03/18    Authorization Type  FOTO AT LEAST EVERY 5TH VISIT, 10TH VISIT PROGRESS NOTE AND KX MODIFIER AFTER THE 15 VISIT.    Authorization - Visit Number  --   Patient late for treatment.   PT Start Time  0241    PT Stop Time  0318    PT Time Calculation (min)  37 min    Activity Tolerance  Patient tolerated treatment well    Behavior During Therapy  [facility] for tasks assessed/performed       Past Medical History:  Diagnosis Date  . Depression   . Dysrhythmia    heart palipations  . GERD (gastroesophageal reflux disease)   . HTN (hypertension)   . Hypercholesteremia   . PONV (postoperative nausea and vomiting)     Past Surgical History:  Procedure Laterality Date  . APPENDECTOMY    . ARTHROSCOPIC REPAIR ACL  05/2008   Left knee  . CARPAL TUNNEL RELEASE Right 07/14/2017   Procedure: CARPAL TUNNEL RELEASE;  Surgeon: Carole Civil, MD;  Location: [facility];  Service: Orthopedics;  Laterality: Right;  . CHOLECYSTECTOMY    . DILATION AND CURETTAGE OF UTERUS    . DORSAL COMPARTMENT RELEASE Right 07/14/2017   Procedure: RELEASE DORSAL COMPARTMENT (DEQUERVAIN);  Surgeon: Carole Civil, MD;  Location: [facility];  Service: Orthopedics;  Laterality: Right;  . HYSTEROSCOPY W/D&C N/A 12/25/2013   Procedure: DILATATION AND CURETTAGE /HYSTEROSCOPY;  Surgeon: Gus Height, MD;  Location: [facility];  Service: Gynecology;  Laterality: N/A;  . TUBAL LIGATION      There were no vitals filed for this visit.  Subjective Assessment - 05/04/18 1652    Subjective   Sometimes I feel like therapy s helping and then sometimes I don't.  If I grab even light objects with my right hand I feel sharp pain in my shoulder.    Pertinent History  CTS; left knee surgery.    Patient Stated Goals  Get out of pain.    Currently in Pain?  Yes    Pain Score  4     Pain Location  Shoulder    Pain Orientation  Left    Pain Descriptors / Indicators  Tightness    Pain Type  Acute pain    Pain Onset  More than a month ago    Pain Frequency  Intermittent    Pain Onset  More than a month ago                       [facility] Adult PT Treatment/Exercise - 05/04/18 0001      Electrical Stimulation   Electrical Stimulation Location  Left acromial ridge region of left shoulder.    Electrical Stimulation Action  Pre-mod.    Electrical Stimulation Parameters  80-150 Hz x 15 minutes.    Electrical Stimulation Goals  Tone;Pain      Ultrasound   Ultrasound Location  Left shoulder acromial ridge region.    Ultrasound Parameters  Combo e'stim/U/S at 1.50 W/CM2 x  10 minutes.    Ultrasound Goals  Pain      Manual Therapy   Manual Therapy  Soft tissue mobilization    Soft tissue mobilization  IASTM to left shoulder deltoid region x 8 minutes.               PT Short Term Goals - 04/03/18 1253      PT SHORT TERM GOAL #1   Title  STG's=LTG's.        PT Long Term Goals - 04/03/18 1253      PT LONG TERM GOAL #1   Title  Independent with a HEP.    Time  6    Period  Weeks    Status  New      PT LONG TERM GOAL #2   Title  Increase active cervical rotation to 75 degrees+ so patient can turn head more easily while driving.    Time  6    Period  Weeks    Status  New      PT LONG TERM GOAL #3   Title  Perform ADL's with left shoulder pain not > 2-3/10.    Time  6    Period  Weeks    Status  New            Plan - 05/04/18 1705    Clinical Impression Statement  Focused treatment on left lateral shoulder region today.  She responded well to  treatment today.      PT Treatment/Interventions  ADLs/Self Care Home Management;Electrical Stimulation;Iontophoresis 4mg /ml Dexamethasone;Moist Heat;Traction;Ultrasound;Therapeutic exercise;Therapeutic activities;Patient/family education;Passive range of motion;Manual techniques;Dry needling    Consulted and Agree with Plan of Care  Patient       Patient will benefit from skilled therapeutic intervention in order to improve the following deficits and impairments:     Visit Diagnosis: Cervicalgia  Acute pain of left shoulder     Problem List Patient Active Problem List   Diagnosis Date Noted  . S/P carpal tunnel and DeQuervains release right 07/14/17 07/20/2017  . Carpal tunnel syndrome of right wrist   . De Quervain's disease (tenosynovitis)   . Palpitations 06/28/2013  . Mixed hyperlipidemia 06/28/2013  . Obesity, unspecified 06/28/2013    , [place] MPT 05/04/2018, 5:06 PM  [facility] [address] [place], [place], [address] Phone: [phone]   Fax:  [phone]  Name: NEL STONEKING MRN: [id number] Date of Birth: 1941/03/08

## 2018-05-09 ENCOUNTER — Ambulatory Visit: Payer: Medicare Other | Admitting: Physical Therapy

## 2018-05-09 ENCOUNTER — Encounter: Payer: Self-pay | Admitting: Physical Therapy

## 2018-05-09 DIAGNOSIS — M542 Cervicalgia: Secondary | ICD-10-CM | POA: Diagnosis not present

## 2018-05-09 DIAGNOSIS — M25512 Pain in left shoulder: Secondary | ICD-10-CM

## 2018-05-09 NOTE — Therapy (Signed)
[facility] [place], [place], [address] Phone: [phone]   Fax:  [phone]  Physical Therapy Treatment  Patient Details  Name: Donna Barron MRN: [id number] Date of Birth: 02/09/1941 Referring Provider (PT): Arther Abbott MD.   Encounter Date: 05/09/2018  PT End of Session - 05/09/18 1437    Visit Number  8    Number of Visits  12    Date for PT Re-Evaluation  07/03/18    Authorization Type  FOTO AT LEAST EVERY 5TH VISIT, 10TH VISIT PROGRESS NOTE AND KX MODIFIER AFTER THE 15 VISIT.    PT Start Time  1439    PT Stop Time  1524    PT Time Calculation (min)  45 min    Activity Tolerance  Patient limited by pain    Behavior During Therapy  [facility] for tasks assessed/performed       Past Medical History:  Diagnosis Date  . Depression   . Dysrhythmia    heart palipations  . GERD (gastroesophageal reflux disease)   . HTN (hypertension)   . Hypercholesteremia   . PONV (postoperative nausea and vomiting)     Past Surgical History:  Procedure Laterality Date  . APPENDECTOMY    . ARTHROSCOPIC REPAIR ACL  05/2008   Left knee  . CARPAL TUNNEL RELEASE Right 07/14/2017   Procedure: CARPAL TUNNEL RELEASE;  Surgeon: Carole Civil, MD;  Location: [facility];  Service: Orthopedics;  Laterality: Right;  . CHOLECYSTECTOMY    . DILATION AND CURETTAGE OF UTERUS    . DORSAL COMPARTMENT RELEASE Right 07/14/2017   Procedure: RELEASE DORSAL COMPARTMENT (DEQUERVAIN);  Surgeon: Carole Civil, MD;  Location: [facility];  Service: Orthopedics;  Laterality: Right;  . HYSTEROSCOPY W/D&C N/A 12/25/2013   Procedure: DILATATION AND CURETTAGE /HYSTEROSCOPY;  Surgeon: Gus Height, MD;  Location: [facility];  Service: Gynecology;  Laterality: N/A;  . TUBAL LIGATION      There were no vitals filed for this visit.  Subjective Assessment - 05/09/18 1435    Subjective  Reports that she had increased pain after last treatment.    Pertinent  History  CTS; left knee surgery.    Patient Stated Goals  Get out of pain.    Currently in Pain?  Yes    Pain Score  7     Pain Location  Shoulder    Pain Orientation  Left    Pain Descriptors / Indicators  Discomfort    Pain Type  Acute pain    Pain Onset  More than a month ago         [facility] PT Assessment - 05/09/18 0001      Assessment   Medical Diagnosis  Neck pain; left shoulder pain.    Referring Provider (PT)  Arther Abbott MD.    Next MD Visit  05/2018      Precautions   Precautions  None      Restrictions   Weight Bearing Restrictions  No                   [facility] Adult PT Treatment/Exercise - 05/09/18 0001      Modalities   Modalities  Electrical Stimulation;Ultrasound;Vasopneumatic;Iontophoresis      Acupuncturist Location  L shoulder     Electrical Stimulation Action  Pre-Mod    Electrical Stimulation Parameters  80-150 hz x15 min    Electrical Stimulation Goals  Tone;Pain      Ultrasound  Ultrasound Location  L posteriolateral shoulder     Ultrasound Parameters  Combo 1.5 w/cm2, 100%, 1 mhz x10 min    Ultrasound Goals  Pain      Iontophoresis   Type of Iontophoresis  Dexamethasone    Location  5/6   Left post shoulder.    Dose  4 mg/ml.    Time  8      Vasopneumatic   Number Minutes Vasopneumatic   10 minutes    Vasopnuematic Location   Shoulder    Vasopneumatic Pressure  Low    Vasopneumatic Temperature   34               PT Short Term Goals - 04/03/18 1253      PT SHORT TERM GOAL #1   Title  STG's=LTG's.        PT Long Term Goals - 04/03/18 1253      PT LONG TERM GOAL #1   Title  Independent with a HEP.    Time  6    Period  Weeks    Status  New      PT LONG TERM GOAL #2   Title  Increase active cervical rotation to 75 degrees+ so patient can turn head more easily while driving.    Time  6    Period  Weeks    Status  New      PT LONG TERM GOAL #3   Title  Perform ADL's  with left shoulder pain not > 2-3/10.    Time  6    Period  Weeks    Status  New            Plan - 05/09/18 1548    Clinical Impression Statement  Patient presented in clinic with reports of increased L shoulder pain and soreness since previous treatment. Patient still very palpably tender to L posteriolateral shoulder and was too tender to tolerate gentle manual therapy. Normal modalities response noted following removal of the modalities. Iontophoresis patch donned to L posteriolateral  shoulder along lateral supraspinatus.     Rehab Potential  Excellent    PT Frequency  2x / week    PT Duration  6 weeks    PT Treatment/Interventions  ADLs/Self Care Home Management;Electrical Stimulation;Iontophoresis 4mg /ml Dexamethasone;Moist Heat;Traction;Ultrasound;Therapeutic exercise;Therapeutic activities;Patient/family education;Passive range of motion;Manual techniques;Dry needling    PT Next Visit Plan  Monitor L shoulder symptoms and MD note required for Dr. Aline Brochure next treatment.    Consulted and Agree with Plan of Care  Patient       Patient will benefit from skilled therapeutic intervention in order to improve the following deficits and impairments:  Pain, Decreased range of motion, Decreased activity tolerance, Decreased strength  Visit Diagnosis: Cervicalgia  Acute pain of left shoulder     Problem List Patient Active Problem List   Diagnosis Date Noted  . S/P carpal tunnel and DeQuervains release right 07/14/17 07/20/2017  . Carpal tunnel syndrome of right wrist   . De Quervain's disease (tenosynovitis)   . Palpitations 06/28/2013  . Mixed hyperlipidemia 06/28/2013  . Obesity, unspecified 06/28/2013    Standley Brooking, PTA 05/09/2018, 3:54 PM  [facility] [address] [place], [place], [address] Phone: [phone]   Fax:  [phone]  Name: Donna Barron MRN: [id number] Date of Birth: [birth year]

## 2018-05-15 ENCOUNTER — Ambulatory Visit: Payer: Medicare Other | Admitting: Orthopedic Surgery

## 2018-05-16 ENCOUNTER — Encounter: Payer: Self-pay | Admitting: Physical Therapy

## 2018-05-16 ENCOUNTER — Ambulatory Visit: Payer: Medicare Other | Attending: Orthopedic Surgery | Admitting: Physical Therapy

## 2018-05-16 DIAGNOSIS — M25512 Pain in left shoulder: Secondary | ICD-10-CM

## 2018-05-16 DIAGNOSIS — M542 Cervicalgia: Secondary | ICD-10-CM

## 2018-05-16 NOTE — Therapy (Addendum)
[facility] [place], [place], [address] Phone: [phone]   Fax:  [phone]  Physical Therapy Treatment PHYSICAL THERAPY DISCHARGE SUMMARY  Visits from Start of Care: 9  Current functional level related to goals / functional outcomes: See beow   Remaining deficits: See goals   Education / Equipment: HEP Plan: Patient agrees to discharge.  Patient goals were partially met. Patient is being discharged due to not returning since the last visit.  ?????    Gabriela Eves, PT, DPT 08/02/19    Patient Details  Name: AILIS RIGAUD MRN: [id number] Date of Birth: [birth year] Referring Provider (PT): Arther Abbott MD.   Encounter Date: 05/16/2018  PT End of Session - 05/16/18 1524    Visit Number  9    Number of Visits  12    Date for PT Re-Evaluation  07/03/18    Authorization Type  FOTO AT LEAST EVERY 5TH VISIT, 10TH VISIT PROGRESS NOTE AND KX MODIFIER AFTER THE 15 VISIT.    PT Start Time  1430    PT Stop Time  1520    PT Time Calculation (min)  50 min    Activity Tolerance  Patient limited by pain    Behavior During Therapy  [facility] for tasks assessed/performed       Past Medical History:  Diagnosis Date  . Depression   . Dysrhythmia    heart palipations  . GERD (gastroesophageal reflux disease)   . HTN (hypertension)   . Hypercholesteremia   . PONV (postoperative nausea and vomiting)     Past Surgical History:  Procedure Laterality Date  . APPENDECTOMY    . ARTHROSCOPIC REPAIR ACL  05/2008   Left knee  . CARPAL TUNNEL RELEASE Right 07/14/2017   Procedure: CARPAL TUNNEL RELEASE;  Surgeon: Carole Civil, MD;  Location: [facility];  Service: Orthopedics;  Laterality: Right;  . CHOLECYSTECTOMY    . DILATION AND CURETTAGE OF UTERUS    . DORSAL COMPARTMENT RELEASE Right 07/14/2017   Procedure: RELEASE DORSAL COMPARTMENT (DEQUERVAIN);  Surgeon: Carole Civil, MD;  Location: [facility];  Service:  Orthopedics;  Laterality: Right;  . HYSTEROSCOPY W/D&C N/A 12/25/2013   Procedure: DILATATION AND CURETTAGE /HYSTEROSCOPY;  Surgeon: Gus Height, MD;  Location: [facility];  Service: Gynecology;  Laterality: N/A;  . TUBAL LIGATION      There were no vitals filed for this visit.  Subjective Assessment - 05/16/18 1517    Subjective  Patient reports no significant improvements with pain since start of PT; patient states she was sore and kept icing the shoulder all weekend.    Pertinent History  CTS; left knee surgery.    Patient Stated Goals  Get out of pain.    Currently in Pain?  Yes    Pain Score  7     Pain Location  Shoulder    Pain Orientation  Left    Pain Descriptors / Indicators  Discomfort    Pain Type  Acute pain    Pain Onset  More than a month ago    Pain Frequency  Constant         [facility] PT Assessment - 05/16/18 0001      Assessment   Medical Diagnosis  Neck pain; left shoulder pain.                   [facility] Adult PT Treatment/Exercise - 05/16/18 0001      Modalities   Modalities  Electrical Stimulation;Ultrasound;Vasopneumatic;Iontophoresis  Acupuncturist Location  L shoulder     Electrical Stimulation Action  pre-mod    Electrical Stimulation Parameters  80-150 hz x15 min    Electrical Stimulation Goals  Tone;Pain      Ultrasound   Ultrasound Location  L UT and posterior shoulder    Ultrasound Parameters  Combo 1.5 w/cm2, 100% 27mz, x10 min    Ultrasound Goals  Pain      Iontophoresis   Type of Iontophoresis  Dexamethasone    Location  6/6   Left post shoulder.    Dose  4 mg/ml.    Time  8      Vasopneumatic   Number Minutes Vasopneumatic   15 minutes    Vasopnuematic Location   Shoulder    Vasopneumatic Pressure  Low    Vasopneumatic Temperature   34      Manual Therapy   Manual Therapy  Soft tissue mobilization    Soft tissue mobilization  STW/M left shoulder deltoid region x 8 minutes.                PT Short Term Goals - 04/03/18 1253      PT SHORT TERM GOAL #1   Title  STG's=LTG's.        PT Long Term Goals - 05/16/18 1451      PT LONG TERM GOAL #1   Title  Independent with a HEP.    Time  6    Period  Weeks    Status  Achieved      PT LONG TERM GOAL #2   Title  Increase active cervical rotation to 75 degrees+ so patient can turn head more easily while driving.    Time  6    Period  Weeks    Status  On-going   62 degrees left (+) pain; 70 degrees right     PT LONG TERM GOAL #3   Title  Perform ADL's with left shoulder pain not > 2-3/10.    Time  6    Period  Weeks    Status  On-going   Pain can reach 8/10           Plan - 05/16/18 1525    Clinical Impression Statement  Patient was able to tolerate treatment fairly. Patient is still tender to palpation along posterior aspect of the left shoulder and deltoid; gentle STW/M performed. Goals are ongoing at this time due to pain. Normal response to modalities upon removal. Iontophoresis patch donned and patient was able to recite correct removal time.    Clinical Presentation  Stable    Clinical Decision Making  Low    Rehab Potential  Excellent    PT Frequency  2x / week    PT Duration  6 weeks    PT Treatment/Interventions  ADLs/Self Care Home Management;Electrical Stimulation;Iontophoresis 441mml Dexamethasone;Moist Heat;Traction;Ultrasound;Therapeutic exercise;Therapeutic activities;Patient/family education;Passive range of motion;Manual techniques;Dry needling    PT Next Visit Plan  Continue POC pending MD approval.    Consulted and Agree with Plan of Care  Patient       Patient will benefit from skilled therapeutic intervention in order to improve the following deficits and impairments:  Pain, Decreased range of motion, Decreased activity tolerance, Decreased strength  Visit Diagnosis: Cervicalgia  Acute pain of left shoulder     Problem List Patient Active Problem List    Diagnosis Date Noted  . S/P carpal tunnel and DeQuervains release right 07/14/17 07/20/2017  .  Carpal tunnel syndrome of right wrist   . De Quervain's disease (tenosynovitis)   . Palpitations 06/28/2013  . Mixed hyperlipidemia 06/28/2013  . Obesity, unspecified 06/28/2013   Gabriela Eves, PT, DPT 05/16/2018, 3:32 PM  [facility] [place], [place], [address] Phone: [phone]   Fax:  [phone]  Name: LAYNI KREAMER MRN: [id number] Date of Birth: 07-23-1940

## 2018-05-17 ENCOUNTER — Ambulatory Visit (INDEPENDENT_AMBULATORY_CARE_PROVIDER_SITE_OTHER): Payer: Medicare Other | Admitting: Orthopedic Surgery

## 2018-05-17 ENCOUNTER — Encounter: Payer: Self-pay | Admitting: Orthopedic Surgery

## 2018-05-17 DIAGNOSIS — M75102 Unspecified rotator cuff tear or rupture of left shoulder, not specified as traumatic: Secondary | ICD-10-CM | POA: Diagnosis not present

## 2018-05-17 NOTE — Progress Notes (Signed)
Chief Complaint  Patient presents with  . Neck Pain  . Shoulder Pain    left    Plan - 05/16/18 1525     Clinical Impression Statement  Patient was able to tolerate treatment fairly. Patient is still tender to palpation along posterior aspect of the left shoulder and deltoid; gentle STW/M performed. Goals are ongoing at this time due to pain. Normal response to modalities upon removal. Iontophoresis patch donned and patient was able to recite correct removal time.     Clinical Presentation  Stable     Clinical Decision Making  Low     Rehab Potential  Excellent     PT Frequency  2x / week     PT Duration  6 weeks     PT Treatment/Interventions  ADLs/Self Care Home Management;Electrical Stimulation;Iontophoresis 4mg /ml Dexamethasone;Moist Heat;Traction;Ultrasound;Therapeutic exercise;Therapeutic activities;Patient/family education;Passive range of motion;Manual techniques;Dry needling     PT Next Visit Plan  Continue POC pending MD approval.     Consulted and Agree with Plan of Care  Patient          Patient will benefit from skilled therapeutic intervention in order to improve the following deficits and impairments:  Pain, Decreased range of motion, Decreased activity tolerance, Decreased strength   Donna Barron does not note any improvement in her neck pain she does note improvement in her shoulder range of motion.  She has had a trial now physical therapy cortisone injection NSAID with prednisone and naproxen.  Review of systems she does not complain of any numbness in her upper extremities although she continues with her cervical spine pain and decreased range of motion turning to the left  Shoulder injection helped with her range of motion but still has pain with range of motion pain at rest pain at night and pain lying on her side  Physical Exam  Constitutional: She is oriented to person, place, and time. She appears well-developed and well-nourished.  Musculoskeletal:        Arms: Neurological: She is alert and oriented to person, place, and time.  Psychiatric: She has a normal mood and affect. Judgment normal.  Vitals reviewed.  Neurovascular exam is intact on the left and the right  Right shoulder full range of motion negative impingement sign normal cuff strength  Encounter Diagnosis  Name Primary?  Marland Kitchen Nontraumatic tear of left rotator cuff, unspecified tear extent    MRI left shoulder

## 2018-05-23 ENCOUNTER — Ambulatory Visit (HOSPITAL_COMMUNITY)
Admission: RE | Admit: 2018-05-23 | Discharge: 2018-05-23 | Disposition: A | Discharge status: Home / Self Care | Payer: Medicare Other | Source: Ambulatory Visit | Attending: Orthopedic Surgery | Admitting: Orthopedic Surgery

## 2018-05-23 DIAGNOSIS — E66811 Obesity, class 1: Secondary | ICD-10-CM | POA: Insufficient documentation

## 2018-05-23 DIAGNOSIS — M19012 Primary osteoarthritis, left shoulder: Secondary | ICD-10-CM | POA: Diagnosis not present

## 2018-05-23 DIAGNOSIS — M75102 Unspecified rotator cuff tear or rupture of left shoulder, not specified as traumatic: Secondary | ICD-10-CM | POA: Diagnosis present

## 2018-05-23 DIAGNOSIS — Z6835 Body mass index (BMI) 35.0-35.9, adult: Secondary | ICD-10-CM

## 2018-05-23 DIAGNOSIS — E66812 Obesity, class 2: Secondary | ICD-10-CM | POA: Insufficient documentation

## 2018-05-26 ENCOUNTER — Ambulatory Visit (INDEPENDENT_AMBULATORY_CARE_PROVIDER_SITE_OTHER): Payer: Medicare Other | Admitting: Orthopedic Surgery

## 2018-05-26 VITALS — BP 125/72 | HR 84 | Ht 62.0 in | Wt 215.0 lb

## 2018-05-26 DIAGNOSIS — M75102 Unspecified rotator cuff tear or rupture of left shoulder, not specified as traumatic: Secondary | ICD-10-CM | POA: Diagnosis not present

## 2018-05-26 MED ORDER — TRAMADOL-ACETAMINOPHEN 37.5-325 MG PO TABS: 1.0000 | ORAL_TABLET | ORAL | 5 refills | Status: DC | PRN

## 2018-05-26 NOTE — Progress Notes (Signed)
Progress Note   Patient ID: Donna Barron, female   DOB: Oct 26, 1940, 77 y.o.   MRN: [id number]   Chief Complaint  Patient presents with  . Results    MRI results of left shoulder.    HPI 77 year old female with painful right shoulder believed to be a traumatic rotator cuff tear had injection and physical therapy still complains of dull aching pain moderate in severity left shoulder constant with inability to raise her left arm Review of Systems  Musculoskeletal: Negative for neck pain.  Neurological: Positive for focal weakness. Negative for tingling.   No outpatient medications have been marked as taking for the 05/26/18 encounter (Office Visit) with Carole Civil, MD.    Past Medical History:  Diagnosis Date  . Depression   . Dysrhythmia    heart palipations  . GERD (gastroesophageal reflux disease)   . HTN (hypertension)   . Hypercholesteremia   . PONV (postoperative nausea and vomiting)      Allergies  Allergen Reactions  . Avelox [Moxifloxacin Hcl In Nacl] Other (See Comments)    insomnia  . Oxybutynin Palpitations and Other (See Comments)    Tongue felt coated      BP 125/72   Pulse 84   Ht 5\' 2"  (1.575 m)   Wt 215 lb (97.5 kg)   BMI 39.32 kg/m    Physical Exam General appearance normal Oriented x3 normal Mood pleasant affect normal Ortho Exam Weakness left shoulder especially with abduction and flexion    MEDICAL DECISION MAKING   Imaging:  I interpreted her image as torn rotator cuff with 2 to 3 cm of retraction without atrophy type II acromion with some arthritis acromioclavicular joint   Encounter Diagnosis  Name Primary?  Marland Kitchen Nontraumatic tear of left rotator cuff, unspecified tear extent Yes     PLAN: (RX., injection, surgery,frx,mri/ct, XR 2 body ares) Surgery has been recommended based on her failure to improve with therapy and injection and continued weakness in her left arm  The procedure has been fully reviewed with the  patient; The risks and benefits of surgery have been discussed and explained and understood. Alternative treatment has also been reviewed, questions were encouraged and answered. The postoperative plan is also been reviewed.  Open rotator cuff repair left shoulder with Arthrex graft available  Meds ordered this encounter  Medications  . traMADol-acetaminophen (ULTRACET) 37.5-325 MG tablet    Sig: Take 1 tablet by mouth every 4 (four) hours as needed.    Dispense:  90 tablet    Refill:  5   12:00 PM 05/26/2018

## 2018-05-26 NOTE — Patient Instructions (Signed)
You have decided to proceed with rotator cuff repair surgery. You have decided not to continue with nonoperative measures such as but not limited to oral medication,   activity modification, physical therapy, or injection.  We will perform a rotator cuff repair. Some of the risks associated with rotator cuff repair include but are not limited to Bleeding Infection Swelling Stiffness Blood clot Pain Re-tearing of the rotator cuff Failure of the rotator cuff to heal  In compliance with recent [place] law in federal regulation regarding opioid use and abuse and addiction, we will taper (stop) opioid medication after 6 weeks.  If you're not comfortable with these risks and would like to continue with nonoperative treatment please let Dr. Aline Brochure know prior to your surgery.

## 2018-05-30 NOTE — Patient Instructions (Signed)
Donna Barron  05/30/2018     @PREFPERIOPPHARMACY @   Your procedure is scheduled on  06/15/2018 .  Report to [facility] at  615  A.M.  Call this number if you have problems the morning of surgery:  [phone]   Remember:  Do not eat or drink after midnight.                         Take these medicines the morning of surgery with A SIP OF WATER buproprion, metoprolol, panpratozole, tramadol.    Do not wear jewelry, make-up or nail polish.  Do not wear lotions, powders, or perfumes, or deodorant.  Do not shave 48 hours prior to surgery.  Men may shave face and neck.  Do not bring valuables to the hospital.  [facility] is not responsible for any belongings or valuables.  Contacts, dentures or bridgework may not be worn into surgery.  Leave your suitcase in the car.  After surgery it may be brought to your room.  For patients admitted to the hospital, discharge time will be determined by your treatment team.  Patients discharged the day of surgery will not be allowed to drive home.   Name and phone number of your driver:   family Special instructions:  None  Please read over the following fact sheets that you were given. Anesthesia Post-op Instructions and Care and Recovery After Surgery       Rotator Cuff Injury Rotator cuff injury is any type of injury to the set of muscles and tendons that make up the stabilizing unit of your shoulder. This unit holds the ball of your upper arm bone (humerus) in the socket of your shoulder blade (scapula). What are the causes? Injuries to your rotator cuff most commonly come from sports or activities that cause your arm to be moved repeatedly over your head. Examples of this include throwing, weight lifting, swimming, or racquet sports. Long lasting (chronic) irritation of your rotator cuff can cause soreness and swelling (inflammation), bursitis, and eventual damage to your tendons, such as a tear (rupture). What  are the signs or symptoms? Acute rotator cuff tear:  Sudden tearing sensation followed by severe pain shooting from your upper shoulder down your arm toward your elbow.  Decreased range of motion of your shoulder because of pain and muscle spasm.  Severe pain.  Inability to raise your arm out to the side because of pain and loss of muscle power (large tears).  Chronic rotator cuff tear:  Pain that usually is worse at night and may interfere with sleep.  Gradual weakness and decreased shoulder motion as the pain worsens.  Decreased range of motion.  Rotator cuff tendinitis:  Deep ache in your shoulder and the outside upper arm over your shoulder.  Pain that comes on gradually and becomes worse when lifting your arm to the side or turning it inward.  How is this diagnosed? Rotator cuff injury is diagnosed through a medical history, physical exam, and imaging exam. The medical history helps determine the type of rotator cuff injury. Your health care provider will look at your injured shoulder, feel the injured area, and ask you to move your shoulder in different positions. X-ray exams typically are done to rule out other causes of shoulder pain, such as fractures. MRI is the exam of choice for the most severe shoulder injuries because the images show muscles  and tendons. How is this treated? Chronic tear:  Medicine for pain, such as acetaminophen or ibuprofen.  Physical therapy and range-of-motion exercises may be helpful in maintaining shoulder function and strength.  Steroid injections into your shoulder joint.  Surgical repair of the rotator cuff if the injury does not heal with noninvasive treatment.  Acute tear:  Anti-inflammatory medicines such as ibuprofen and naproxen to help reduce pain and swelling.  A sling to help support your arm and rest your rotator cuff muscles. Long-term use of a sling is not advised. It may cause significant stiffening of the shoulder  joint.  Surgery may be considered within a few weeks, especially in younger, active people, to return the shoulder to full function.  Indications for surgical treatment include the following: ? Age younger than 29 years. ? Rotator cuff tears that are complete. ? Physical therapy, rest, and anti-inflammatory medicines have been used for 6-8 weeks, with no improvement. ? Employment or sporting activity that requires constant shoulder use.  Tendinitis:  Anti-inflammatory medicines such as ibuprofen and naproxen to help reduce pain and swelling.  A sling to help support your arm and rest your rotator cuff muscles. Long-term use of a sling is not advised. It may cause significant stiffening of the shoulder joint.  Severe tendinitis may require: ? Steroid injections into your shoulder joint. ? Physical therapy. ? Surgery.  Follow these instructions at home:  Apply ice to your injury: ? Put ice in a plastic bag. ? Place a towel between your skin and the bag. ? Leave the ice on for 20 minutes, 2-3 times a day.  If you have a shoulder immobilizer (sling and straps), wear it until told otherwise by your health care provider.  You may want to sleep on several pillows or in a recliner at night to lessen swelling and pain.  Only take over-the-counter or prescription medicines for pain, discomfort, or fever as directed by your health care provider.  Do simple hand squeezing exercises with a soft rubber ball to decrease hand swelling. Contact a health care provider if:  Your shoulder pain increases, or new pain or numbness develops in your arm, hand, or fingers.  Your hand or fingers are colder than your other hand. Get help right away if:  Your arm, hand, or fingers are numb or tingling.  Your arm, hand, or fingers are increasingly swollen and painful, or they turn white or blue. This information is not intended to replace advice given to you by your health care provider. Make sure you  discuss any questions you have with your health care provider. Document Released: 05/28/2000 Document Revised: 11/06/2015 Document Reviewed: 01/10/2013 Elsevier Interactive Patient Education  2018 [employer].  Surgery for Rotator Cuff Tear, Care After Refer to this sheet in the next few weeks. These instructions provide you with information about caring for yourself after your procedure. Your health care provider may also give you more specific instructions. Your treatment has been planned according to current medical practices, but problems sometimes occur. Call your health care provider if you have any problems or questions after your procedure. What can I expect after the procedure? After the procedure, it is common to have:  Swelling.  Pain.  Stiffness.  Tenderness.  Follow these instructions at home: If you have a sling:  Wear the sling as told by your health care provider. Remove it only as told by your health care provider.  Loosen the sling if your fingers tingle, become numb, or  turn cold and blue.  Do not let your sling get wet if it is not waterproof.  Keep the sling clean. Bathing  Do not take baths, swim, or use a hot tub until your health care provider approves. Ask your health care provider if you can take showers. You may only be allowed to take sponge baths for bathing.  Keep your bandage (dressing) dry until your health care provider says it can be removed. Incision care  Follow instructions from your health care provider about how to take care of your incision. Make sure you: ? Wash your hands with soap and water before you change your dressing. If soap and water are not available, use hand sanitizer. ? Change your dressing as told by your health care provider. ? Leave stitches (sutures), skin glue, or adhesive strips in place. These skin closures may need to stay in place for 2 weeks or longer. If adhesive strip edges start to loosen and curl up, you may  trim the loose edges. Do not remove adhesive strips completely unless your health care provider tells you to do that.  Check your incision area every day for signs of infection. Check for: ? More redness, swelling, or pain. ? More fluid or blood. ? Warmth. ? Pus or a bad smell. Managing pain, stiffness, and swelling   If directed, put ice on your shoulder area. ? Put ice in a plastic bag. ? Place a towel between your skin and the bag. ? Leave the ice on for 20 minutes, 2-3 times a day.  Move your fingers often to avoid stiffness and to lessen swelling.  Raise (elevate) your upper body on pillows when you lie down and when you sleep. ? Do not sleep on the front of your body (abdomen). ? Do not sleep on the side that your surgery was performed on. Driving  Do not drive for 24 hours if you received a medicine to help you relax (sedative) during your procedure.  Do not drive or operate heavy machinery while taking prescription pain medicine.  Ask your health care provider when it is safe for you to drive. Activity  Do not use your arm to support your body weight until your health care provider approves.  Do not lift or hold anything with your arm until your health care provider approves.  Return to your normal activities as told by your health care provider. Ask your health care provider what activities are safe for you.  Do exercises as told by your health care provider. General instructions   Do not use any tobacco products, such as cigarettes, chewing tobacco, or e-cigarettes. Tobacco can delay healing. If you need help quitting, ask your health care provider.  Take over-the-counter and prescription medicines only as told by your health care provider.  If you were prescribed an antibiotic medicine, take it as told by your health care provider. Do not stop taking the antibiotic even if you start to feel better.  Keep all follow-up visits as told by your health care  provider. This is important. Contact a health care provider if:  You have a fever.  You have more redness, swelling, or pain around your incision.  You have more fluid or blood coming from your incision.  Your incision feels warm to the touch.  You have pus or a bad smell coming from your incision.  You have pain that gets worse or does not get better with medicine. Get help right away if:  You have  severe pain.  You lose feeling in your arm or hand.  Your hand or fingers turn very pale or blue. This information is not intended to replace advice given to you by your health care provider. Make sure you discuss any questions you have with your health care provider. Document Released: 05/31/2005 Document Revised: 02/04/2016 Document Reviewed: 06/14/2015 Elsevier Interactive Patient Education  2018 [employer] Anesthesia, Adult General anesthesia is the use of medicines to make a person "go to sleep" (be unconscious) for a medical procedure. General anesthesia is often recommended when a procedure:  Is long.  Requires you to be still or in an unusual position.  Is major and can cause you to lose blood.  Is impossible to do without general anesthesia.  The medicines used for general anesthesia are called general anesthetics. In addition to making you sleep, the medicines:  Prevent pain.  Control your blood pressure.  Relax your muscles.  Tell a health care provider about:  Any allergies you have.  All medicines you are taking, including vitamins, herbs, eye drops, creams, and over-the-counter medicines.  Any problems you or family members have had with anesthetic medicines.  Types of anesthetics you have had in the past.  Any bleeding disorders you have.  Any surgeries you have had.  Any medical conditions you have.  Any history of heart or lung conditions, such as heart failure, sleep apnea, or chronic obstructive pulmonary disease  (COPD).  Whether you are pregnant or may be pregnant.  Whether you use tobacco, alcohol, marijuana, or street drugs.  Any history of Armed forces logistics/support/administrative officer.  Any history of depression or anxiety. What are the risks? Generally, this is a safe procedure. However, problems may occur, including:  Allergic reaction to anesthetics.  Lung and heart problems.  Inhaling food or liquids from your stomach into your lungs (aspiration).  Injury to nerves.  Waking up during your procedure and being unable to move (rare).  Extreme agitation or a state of mental confusion (delirium) when you wake up from the anesthetic.  Air in the bloodstream, which can lead to stroke.  These problems are more likely to develop if you are having a major surgery or if you have an advanced medical condition. You can prevent some of these complications by answering all of your health care provider's questions thoroughly and by following all pre-procedure instructions. General anesthesia can cause side effects, including:  Nausea or vomiting  A sore throat from the breathing tube.  Feeling cold or shivery.  Feeling tired, washed out, or achy.  Sleepiness or drowsiness.  Confusion or agitation.  What happens before the procedure? Staying hydrated Follow instructions from your health care provider about hydration, which may include:  Up to 2 hours before the procedure - you may continue to drink clear liquids, such as water, clear fruit juice, black coffee, and plain tea.  Eating and drinking restrictions Follow instructions from your health care provider about eating and drinking, which may include:  8 hours before the procedure - stop eating heavy meals or foods such as meat, fried foods, or fatty foods.  6 hours before the procedure - stop eating light meals or foods, such as toast or cereal.  6 hours before the procedure - stop drinking milk or drinks that contain milk.  2 hours before the procedure  - stop drinking clear liquids.  Medicines  Ask your health care provider about: ? Changing or stopping your regular medicines. This is especially important if  you are taking diabetes medicines or blood thinners. ? Taking medicines such as aspirin and ibuprofen. These medicines can thin your blood. Do not take these medicines before your procedure if your health care provider instructs you not to. ? Taking new dietary supplements or medicines. Do not take these during the week before your procedure unless your health care provider approves them.  If you are told to take a medicine or to continue taking a medicine on the day of the procedure, take the medicine with sips of water. General instructions   Ask if you will be going home the same day, the following day, or after a longer hospital stay. ? Plan to have someone take you home. ? Plan to have someone stay with you for the first 24 hours after you leave the hospital or clinic.  For 3-6 weeks before the procedure, try not to use any tobacco products, such as cigarettes, chewing tobacco, and e-cigarettes.  You may brush your teeth on the morning of the procedure, but make sure to spit out the toothpaste. What happens during the procedure?  You will be given anesthetics through a mask and through an IV tube in one of your veins.  You may receive medicine to help you relax (sedative).  As soon as you are asleep, a breathing tube may be used to help you breathe.  An anesthesia specialist will stay with you throughout the procedure. He or she will help keep you comfortable and safe by continuing to give you medicines and adjusting the amount of medicine that you get. He or she will also watch your blood pressure, pulse, and oxygen levels to make sure that the anesthetics do not cause any problems.  If a breathing tube was used to help you breathe, it will be removed before you wake up. The procedure may vary among health care providers  and hospitals. What happens after the procedure?  You will wake up, often slowly, after the procedure is complete, usually in a recovery area.  Your blood pressure, heart rate, breathing rate, and blood oxygen level will be monitored until the medicines you were given have worn off.  You may be given medicine to help you calm down if you feel anxious or agitated.  If you will be going home the same day, your health care provider may check to make sure you can stand, drink, and urinate.  Your health care providers will treat your pain and side effects before you go home.  Do not drive for 24 hours if you received a sedative.  You may: ? Feel nauseous and vomit. ? Have a sore throat. ? Have mental slowness. ? Feel cold or shivery. ? Feel sleepy. ? Feel tired. ? Feel sore or achy, even in parts of your body where you did not have surgery. This information is not intended to replace advice given to you by your health care provider. Make sure you discuss any questions you have with your health care provider. Document Released: 09/07/2007 Document Revised: 11/11/2015 Document Reviewed: 05/15/2015 Elsevier Interactive Patient Education  2018 [place] Anesthesia, Adult, Care After These instructions provide you with information about caring for yourself after your procedure. Your health care provider may also give you more specific instructions. Your treatment has been planned according to current medical practices, but problems sometimes occur. Call your health care provider if you have any problems or questions after your procedure. What can I expect after the procedure? After the procedure, it  is common to have:  Vomiting.  A sore throat.  Mental slowness.  It is common to feel:  Nauseous.  Cold or shivery.  Sleepy.  Tired.  Sore or achy, even in parts of your body where you did not have surgery.  Follow these instructions at home: For at least 24 hours  after the procedure:  Do not: ? Participate in activities where you could fall or become injured. ? Drive. ? Use heavy machinery. ? Drink alcohol. ? Take sleeping pills or medicines that cause drowsiness. ? Make important decisions or sign legal documents. ? Take care of children on your own.  Rest. Eating and drinking  If you vomit, drink water, juice, or soup when you can drink without vomiting.  Drink enough fluid to keep your urine clear or pale yellow.  Make sure you have little or no nausea before eating solid foods.  Follow the diet recommended by your health care provider. General instructions  Have a responsible adult stay with you until you are awake and alert.  Return to your normal activities as told by your health care provider. Ask your health care provider what activities are safe for you.  Take over-the-counter and prescription medicines only as told by your health care provider.  If you smoke, do not smoke without supervision.  Keep all follow-up visits as told by your health care provider. This is important. Contact a health care provider if:  You continue to have nausea or vomiting at home, and medicines are not helpful.  You cannot drink fluids or start eating again.  You cannot urinate after 8-12 hours.  You develop a skin rash.  You have fever.  You have increasing redness at the site of your procedure. Get help right away if:  You have difficulty breathing.  You have chest pain.  You have unexpected bleeding.  You feel that you are having a life-threatening or urgent problem. This information is not intended to replace advice given to you by your health care provider. Make sure you discuss any questions you have with your health care provider. Document Released: 09/06/2000 Document Revised: 11/03/2015 Document Reviewed: 05/15/2015 Elsevier Interactive Patient Education  [employer].

## 2018-06-09 ENCOUNTER — Encounter (HOSPITAL_COMMUNITY)
Admission: RE | Admit: 2018-06-09 | Discharge: 2018-06-09 | Disposition: A | Discharge status: Home / Self Care | Payer: Medicare Other | Source: Ambulatory Visit | Attending: Orthopedic Surgery | Admitting: Orthopedic Surgery

## 2018-06-09 ENCOUNTER — Encounter (HOSPITAL_COMMUNITY): Payer: Self-pay

## 2018-06-09 ENCOUNTER — Other Ambulatory Visit: Payer: Self-pay

## 2018-06-09 DIAGNOSIS — Z01812 Encounter for preprocedural laboratory examination: Secondary | ICD-10-CM | POA: Insufficient documentation

## 2018-06-09 LAB — CBC WITH DIFFERENTIAL/PLATELET
Abs Immature Granulocytes: 0.02 10*3/uL (ref 0.00–0.07)
BASOS ABS: 0.1 10*3/uL (ref 0.0–0.1)
BASOS PCT: 1 %
EOS ABS: 0.1 10*3/uL (ref 0.0–0.5)
EOS PCT: 2 %
HEMATOCRIT: 35.9 % — AB (ref 36.0–46.0)
Hemoglobin: 10.4 g/dL — ABNORMAL LOW (ref 12.0–15.0)
IMMATURE GRANULOCYTES: 0 %
Lymphocytes Relative: 30 %
Lymphs Abs: 2.3 10*3/uL (ref 0.7–4.0)
MCH: 22.4 pg — ABNORMAL LOW (ref 26.0–34.0)
MCHC: 29 g/dL — ABNORMAL LOW (ref 30.0–36.0)
MCV: 77.4 fL — AB (ref 80.0–100.0)
Monocytes Absolute: 0.7 10*3/uL (ref 0.1–1.0)
Monocytes Relative: 9 %
NEUTROS PCT: 58 %
NRBC: 0 % (ref 0.0–0.2)
Neutro Abs: 4.3 10*3/uL (ref 1.7–7.7)
PLATELETS: 451 10*3/uL — AB (ref 150–400)
RBC: 4.64 MIL/uL (ref 3.87–5.11)
RDW: 17.2 % — ABNORMAL HIGH (ref 11.5–15.5)
WBC: 7.5 10*3/uL (ref 4.0–10.5)

## 2018-06-09 LAB — BASIC METABOLIC PANEL
ANION GAP: 9 (ref 5–15)
BUN: 16 mg/dL (ref 8–23)
CALCIUM: 8.7 mg/dL — AB (ref 8.9–10.3)
CO2: 29 mmol/L (ref 22–32)
CREATININE: 0.92 mg/dL (ref 0.44–1.00)
Chloride: 99 mmol/L (ref 98–111)
GFR calc non Af Amer: >60 mL/min (ref >60)
GLUCOSE: 129 mg/dL — AB (ref 70–99)
Potassium: 2.9 mmol/L — ABNORMAL LOW (ref 3.5–5.1)
Sodium: 137 mmol/L (ref 135–145)

## 2018-06-12 NOTE — Pre-Procedure Instructions (Signed)
Potassium routed to Dr Harrison. 

## 2018-06-13 ENCOUNTER — Other Ambulatory Visit (HOSPITAL_COMMUNITY): Payer: Medicare Other

## 2018-06-13 NOTE — OR Nursing (Signed)
Potassium result of 2.9 reported to Dr. Rick Duff, also EKG on file shown to Dr. Rick Duff. Orders given to repeat EKG and due Istat EC4 on arrival on day of surgery.

## 2018-06-13 NOTE — OR Nursing (Signed)
Patient called to inform her that her potassium was on the low side.  Gave her a list of foods that are high in potasium. Requested for her to try to increase her intake of potassium foods prior to her surgery due to the fact that we have an order to repeat her blood work on [employer]. at time of arrival.  Verbalized understanding . Dr. Rick Duff aware that I instructed the patient about increasing her potassium intake.

## 2018-06-14 ENCOUNTER — Other Ambulatory Visit: Payer: Self-pay | Admitting: Orthopedic Surgery

## 2018-06-14 DIAGNOSIS — E876 Hypokalemia: Secondary | ICD-10-CM

## 2018-06-14 HISTORY — PX: CATARACT EXTRACTION, BILATERAL: SHX1313

## 2018-06-14 MED ORDER — POTASSIUM CHLORIDE CRYS ER 20 MEQ PO TBCR: 40.0000 meq | EXTENDED_RELEASE_TABLET | Freq: Two times a day (BID) | ORAL | 0 refills | Status: DC

## 2018-06-14 NOTE — H&P (Signed)
Patient ID: Donna Barron, female   DOB: 05/19/1941, 78 y.o.   MRN: [id number]         Chief Complaint  Patient presents with  . Results      MRI results of left shoulder.      HPI 78 year old female with painful right shoulder believed to be a traumatic rotator cuff tear had injection and physical therapy still complains of dull aching pain moderate in severity left shoulder constant with inability to raise her left arm  Review of Systems  Musculoskeletal: Negative for neck pain.  Neurological: Positive for focal weakness. Negative for tingling.   Past Medical History:  Diagnosis Date  . Depression   . Dysrhythmia    heart palipations  . GERD (gastroesophageal reflux disease)   . HTN (hypertension)   . Hypercholesteremia   . PONV (postoperative nausea and vomiting)    Past Surgical History:  Procedure Laterality Date  . APPENDECTOMY    . ARTHROSCOPIC REPAIR ACL  05/2008   Left knee  . CARPAL TUNNEL RELEASE Right 07/14/2017   Procedure: CARPAL TUNNEL RELEASE;  Surgeon: Carole Civil, MD;  Location: [facility];  Service: Orthopedics;  Laterality: Right;  . CHOLECYSTECTOMY    . DILATION AND CURETTAGE OF UTERUS    . DORSAL COMPARTMENT RELEASE Right 07/14/2017   Procedure: RELEASE DORSAL COMPARTMENT (DEQUERVAIN);  Surgeon: Carole Civil, MD;  Location: [facility];  Service: Orthopedics;  Laterality: Right;  . HYSTEROSCOPY W/D&C N/A 12/25/2013   Procedure: DILATATION AND CURETTAGE /HYSTEROSCOPY;  Surgeon: Gus Height, MD;  Location: [facility];  Service: Gynecology;  Laterality: N/A;  . TUBAL LIGATION     Family History  Problem Relation Age of Onset  . Heart failure Mother 2  . Lung cancer Father 15   Social History   Tobacco Use  . Smoking status: Never Smoker  . Smokeless tobacco: Never Used  Substance Use Topics  . Alcohol use: Yes    Comment: rarely  . Drug use: No     Active Medications  No outpatient medications have been marked as taking for the 05/26/18  encounter (Office Visit) with Carole Civil, MD.            Past Medical History:  Diagnosis Date  . Depression    . Dysrhythmia      heart palipations  . GERD (gastroesophageal reflux disease)    . HTN (hypertension)    . Hypercholesteremia    . PONV (postoperative nausea and vomiting)               Allergies  Allergen Reactions  . Avelox [Moxifloxacin Hcl In Nacl] Other (See Comments)      insomnia  . Oxybutynin Palpitations and Other (See Comments)      Tongue felt coated         BP 125/72   Pulse 84   Ht 5\' 2"  (1.575 m)   Wt 215 lb (97.5 kg)   BMI 39.32 kg/m      Physical Exam Physical Exam HENT:     Head: Normocephalic and atraumatic.     Right Ear: External ear normal.     Left Ear: External ear normal.     Nose: No congestion or rhinorrhea.     Mouth/Throat:     Mouth: Mucous membranes are moist.     Pharynx: Oropharynx is clear. No oropharyngeal exudate.  Eyes:     General: No scleral icterus.    Extraocular Movements: Extraocular movements intact.  Conjunctiva/sclera: Conjunctivae normal.     Pupils: Pupils are equal, round, and reactive to light.  Cardiovascular:     Pulses: Normal pulses.  Pulmonary:     Effort: Pulmonary effort is normal.     Breath sounds: No wheezing.  Abdominal:     General: There is no distension.     Palpations: Abdomen is soft.  Musculoskeletal:     Right shoulder: Normal.     Right upper leg: Normal.     Left upper leg: Normal.     Right lower leg: Normal.     Left lower leg: Normal.  Lymphadenopathy:     Cervical: No cervical adenopathy.  Skin:    Capillary Refill: Capillary refill takes less than 2 seconds.  Neurological:     Cranial Nerves: No cranial nerve deficit.     Coordination: Coordination normal.     Gait: Gait normal.     Deep Tendon Reflexes: Reflexes normal.     General appearance normal Oriented x3 normal Mood pleasant affect normal Ortho Exam Tenderness left proximal shoulder  along the rotator interval AROM 120 flexion normal ER and ABDuction Instabilty none  Weakness left shoulder especially with abduction and flexion       MEDICAL DECISION MAKING    Imaging:  I interpreted her image as torn rotator cuff with 2 to 3 cm of retraction without atrophy type II acromion with some arthritis acromioclavicular joint         Encounter Diagnosis  Name Primary?  Marland Kitchen Nontraumatic tear of left rotator cuff, unspecified tear extent Yes        PLAN: (RX., injection, surgery,frx,mri/ct, XR 2 body ares) Surgery has been recommended based on her failure to improve with therapy and injection and continued weakness in her left arm   The procedure has been fully reviewed with the patient; The risks and benefits of surgery have been discussed and explained and understood. Alternative treatment has also been reviewed, questions were encouraged and answered. The postoperative plan is also been reviewed.   Open rotator cuff repair left shoulder with Arthrex graft available

## 2018-06-15 ENCOUNTER — Encounter (HOSPITAL_COMMUNITY): Payer: Self-pay | Admitting: Anesthesiology

## 2018-06-15 ENCOUNTER — Ambulatory Visit (HOSPITAL_COMMUNITY): Payer: Medicare Other | Admitting: Anesthesiology

## 2018-06-15 ENCOUNTER — Ambulatory Visit (HOSPITAL_COMMUNITY)
Admission: RE | Admit: 2018-06-15 | Discharge: 2018-06-15 | Disposition: A | Discharge status: Home / Self Care | Payer: Medicare Other | Attending: Orthopedic Surgery | Admitting: Orthopedic Surgery

## 2018-06-15 ENCOUNTER — Encounter (HOSPITAL_COMMUNITY)
Admission: RE | Disposition: A | Discharge status: Home / Self Care | Payer: Self-pay | Source: Home / Self Care | Attending: Orthopedic Surgery

## 2018-06-15 DIAGNOSIS — I10 Essential (primary) hypertension: Secondary | ICD-10-CM | POA: Diagnosis not present

## 2018-06-15 DIAGNOSIS — M75122 Complete rotator cuff tear or rupture of left shoulder, not specified as traumatic: Secondary | ICD-10-CM | POA: Diagnosis not present

## 2018-06-15 DIAGNOSIS — M25512 Pain in left shoulder: Secondary | ICD-10-CM | POA: Diagnosis present

## 2018-06-15 DIAGNOSIS — Z9889 Other specified postprocedural states: Secondary | ICD-10-CM | POA: Insufficient documentation

## 2018-06-15 DIAGNOSIS — M75102 Unspecified rotator cuff tear or rupture of left shoulder, not specified as traumatic: Secondary | ICD-10-CM | POA: Insufficient documentation

## 2018-06-15 HISTORY — PX: SHOULDER OPEN ROTATOR CUFF REPAIR: SHX2407

## 2018-06-15 LAB — POCT I-STAT, CHEM 8
BUN: 11 mg/dL (ref 8–23)
Calcium, Ion: 0.99 mmol/L — ABNORMAL LOW (ref 1.15–1.40)
Chloride: 98 mmol/L (ref 98–111)
Creatinine, Ser: 0.9 mg/dL (ref 0.44–1.00)
Glucose, Bld: 121 mg/dL — ABNORMAL HIGH (ref 70–99)
HEMATOCRIT: 41 % (ref 36.0–46.0)
Hemoglobin: 13.9 g/dL (ref 12.0–15.0)
Potassium: 4.3 mmol/L (ref 3.5–5.1)
Sodium: 135 mmol/L (ref 135–145)
TCO2: 33 mmol/L — ABNORMAL HIGH (ref 22–32)

## 2018-06-15 SURGERY — REPAIR, ROTATOR CUFF, OPEN
Anesthesia: General | Site: Shoulder | Laterality: Left

## 2018-06-15 MED ORDER — 0.9 % SODIUM CHLORIDE (POUR BTL) OPTIME
TOPICAL | Status: DC | PRN
  Administered 2018-06-15 (×2): 1000 mL

## 2018-06-15 MED ORDER — MIDAZOLAM HCL 2 MG/2ML IJ SOLN
INTRAMUSCULAR | Status: AC
  Filled 2018-06-15: qty 4

## 2018-06-15 MED ORDER — SUCCINYLCHOLINE CHLORIDE 200 MG/10ML IV SOSY
PREFILLED_SYRINGE | INTRAVENOUS | Status: AC
  Filled 2018-06-15: qty 10

## 2018-06-15 MED ORDER — HYDROCODONE-ACETAMINOPHEN 7.5-325 MG PO TABS: 1.0000 | ORAL_TABLET | ORAL | 0 refills | Status: DC | PRN

## 2018-06-15 MED ORDER — KETOROLAC TROMETHAMINE 30 MG/ML IJ SOLN: 30.0000 mg | Freq: Once | INTRAMUSCULAR | Status: DC | PRN

## 2018-06-15 MED ORDER — ROCURONIUM BROMIDE 10 MG/ML (PF) SYRINGE
PREFILLED_SYRINGE | INTRAVENOUS | Status: AC
  Filled 2018-06-15: qty 30

## 2018-06-15 MED ORDER — LIDOCAINE 2% (20 MG/ML) 5 ML SYRINGE
INTRAMUSCULAR | Status: AC
  Filled 2018-06-15: qty 10

## 2018-06-15 MED ORDER — EPHEDRINE 5 MG/ML INJ
INTRAVENOUS | Status: AC
  Filled 2018-06-15: qty 10

## 2018-06-15 MED ORDER — LACTATED RINGERS IV SOLN
INTRAVENOUS | Status: DC
  Administered 2018-06-15 (×3): via INTRAVENOUS

## 2018-06-15 MED ORDER — FENTANYL CITRATE (PF) 100 MCG/2ML IJ SOLN
INTRAMUSCULAR | Status: DC | PRN
  Administered 2018-06-15 (×2): 50 ug via INTRAVENOUS

## 2018-06-15 MED ORDER — BUPIVACAINE-EPINEPHRINE (PF) 0.25% -1:200000 IJ SOLN
INTRAMUSCULAR | Status: AC
  Filled 2018-06-15: qty 60

## 2018-06-15 MED ORDER — MIDAZOLAM HCL 5 MG/5ML IJ SOLN
INTRAMUSCULAR | Status: DC | PRN
  Administered 2018-06-15: 4 mg via INTRAVENOUS

## 2018-06-15 MED ORDER — PROMETHAZINE HCL 12.5 MG PO TABS: 12.5000 mg | ORAL_TABLET | Freq: Four times a day (QID) | ORAL | 0 refills | Status: DC | PRN

## 2018-06-15 MED ORDER — PROPOFOL 10 MG/ML IV BOLUS
INTRAVENOUS | Status: AC
  Filled 2018-06-15: qty 40

## 2018-06-15 MED ORDER — TIZANIDINE HCL 4 MG PO TABS: 4.0000 mg | ORAL_TABLET | Freq: Three times a day (TID) | ORAL | 0 refills | Status: DC

## 2018-06-15 MED ORDER — LIDOCAINE HCL (CARDIAC) PF 50 MG/5ML IV SOSY
PREFILLED_SYRINGE | INTRAVENOUS | Status: DC | PRN
  Administered 2018-06-15: 50 mg via INTRAVENOUS

## 2018-06-15 MED ORDER — CHLORHEXIDINE GLUCONATE 4 % EX LIQD: 60.0000 mL | Freq: Once | CUTANEOUS | Status: DC

## 2018-06-15 MED ORDER — MEPERIDINE HCL 50 MG/ML IJ SOLN: 6.2500 mg | INTRAMUSCULAR | Status: DC | PRN

## 2018-06-15 MED ORDER — ROPIVACAINE HCL 5 MG/ML IJ SOLN
INTRAMUSCULAR | Status: AC
  Filled 2018-06-15: qty 30

## 2018-06-15 MED ORDER — FENTANYL CITRATE (PF) 250 MCG/5ML IJ SOLN
INTRAMUSCULAR | Status: AC
  Filled 2018-06-15: qty 5

## 2018-06-15 MED ORDER — BUPIVACAINE-EPINEPHRINE (PF) 0.25% -1:200000 IJ SOLN
INTRAMUSCULAR | Status: DC | PRN
  Administered 2018-06-15: 10 mL via PERINEURAL

## 2018-06-15 MED ORDER — EPHEDRINE SULFATE 50 MG/ML IJ SOLN
INTRAMUSCULAR | Status: DC | PRN
  Administered 2018-06-15 (×2): 10 mg via INTRAVENOUS

## 2018-06-15 MED ORDER — HYDROMORPHONE HCL 1 MG/ML IJ SOLN: 0.2500 mg | INTRAMUSCULAR | Status: DC | PRN

## 2018-06-15 MED ORDER — SUCCINYLCHOLINE 20MG/ML (10ML) SYRINGE FOR MEDFUSION PUMP - OPTIME
INTRAMUSCULAR | Status: DC | PRN
  Administered 2018-06-15: 120 mg via INTRAVENOUS

## 2018-06-15 MED ORDER — ONDANSETRON HCL 4 MG/2ML IJ SOLN: 4.0000 mg | Freq: Once | INTRAMUSCULAR | Status: DC | PRN

## 2018-06-15 MED ORDER — ROPIVACAINE HCL 5 MG/ML IJ SOLN
INTRAMUSCULAR | Status: DC | PRN
  Administered 2018-06-15: 20 mL via EPIDURAL

## 2018-06-15 MED ORDER — ONDANSETRON HCL 4 MG/2ML IJ SOLN
INTRAMUSCULAR | Status: DC | PRN
  Administered 2018-06-15: 4 mg via INTRAVENOUS

## 2018-06-15 MED ORDER — ONDANSETRON HCL 4 MG/2ML IJ SOLN
INTRAMUSCULAR | Status: AC
  Filled 2018-06-15: qty 2

## 2018-06-15 MED ORDER — HYDROCODONE-ACETAMINOPHEN 7.5-325 MG PO TABS: 1.0000 | ORAL_TABLET | Freq: Once | ORAL | Status: DC | PRN

## 2018-06-15 MED ORDER — CEFAZOLIN SODIUM-DEXTROSE 2-4 GM/100ML-% IV SOLN
2.0000 g | INTRAVENOUS | Status: AC
  Administered 2018-06-15: 2 g via INTRAVENOUS
  Filled 2018-06-15: qty 100

## 2018-06-15 MED ORDER — PROPOFOL 10 MG/ML IV BOLUS
INTRAVENOUS | Status: DC | PRN
  Administered 2018-06-15: 70 mg via INTRAVENOUS

## 2018-06-15 SURGICAL SUPPLY — 59 items
ANCHOR SUT BIO SW 4.75X19.1 (Anchor) ×1 IMPLANT
BIT DRILL 2.0MX128MM (BIT) IMPLANT
BIT DRILL 2.4X128 (BIT) ×1 IMPLANT
BLADE HEX COATED 2.75 (ELECTRODE) ×2 IMPLANT
BLADE OSC/SAGITTAL MD 9X18.5 (BLADE) ×2 IMPLANT
BUR FAST CUTTING (BURR)
BUR ROUND 5.0 (BURR) IMPLANT
BUR SRG 54X4.7X12 FLUT (BURR) IMPLANT
BURR SRG 54X4.7X12 FLUT (BURR)
CHLORAPREP W/TINT 26ML (MISCELLANEOUS) ×2 IMPLANT
CLOTH BEACON ORANGE TIMEOUT ST (SAFETY) ×2 IMPLANT
COVER LIGHT HANDLE STERIS (MISCELLANEOUS) ×4 IMPLANT
COVER WAND RF STERILE (DRAPES) ×1 IMPLANT
DRAPE ORTHO 2.5IN SPLIT 77X108 (DRAPES) ×1 IMPLANT
DRAPE ORTHO SPLIT 77X108 STRL (DRAPES) ×1
DRAPE PROXIMA HALF (DRAPES) ×2 IMPLANT
DRESSING ALLEVYN BORDER 5X5 (GAUZE/BANDAGES/DRESSINGS) ×2 IMPLANT
ELECT REM PT RETURN 9FT ADLT (ELECTROSURGICAL) ×2
ELECTRODE REM PT RTRN 9FT ADLT (ELECTROSURGICAL) ×1 IMPLANT
GLOVE BIO SURGEON STRL SZ7 (GLOVE) ×3 IMPLANT
GLOVE BIOGEL PI IND STRL 7.0 (GLOVE) ×1 IMPLANT
GLOVE BIOGEL PI INDICATOR 7.0 (GLOVE) ×4
GLOVE SKINSENSE NS SZ8.0 LF (GLOVE) ×1
GLOVE SKINSENSE STRL SZ8.0 LF (GLOVE) ×1 IMPLANT
GLOVE SS N UNI LF 8.5 STRL (GLOVE) ×2 IMPLANT
GOWN STRL REUS W/TWL LRG LVL3 (GOWN DISPOSABLE) ×5 IMPLANT
GOWN STRL REUS W/TWL XL LVL3 (GOWN DISPOSABLE) ×2 IMPLANT
INST SET MINOR BONE (KITS) ×2 IMPLANT
KIT BLADEGUARD II DBL (SET/KITS/TRAYS/PACK) ×2 IMPLANT
KIT TURNOVER KIT A (KITS) ×2 IMPLANT
MANIFOLD NEPTUNE II (INSTRUMENTS) ×2 IMPLANT
MARKER SKIN DUAL TIP RULER LAB (MISCELLANEOUS) ×2 IMPLANT
NDL HYPO 21X1.5 SAFETY (NEEDLE) ×1 IMPLANT
NDL MA TROC 1/2 (NEEDLE) IMPLANT
NDL MAYO 6 CRC TAPER PT (NEEDLE) IMPLANT
NEEDLE HYPO 21X1.5 SAFETY (NEEDLE) ×2 IMPLANT
NEEDLE MA TROC 1/2 (NEEDLE) IMPLANT
NEEDLE MAYO 6 CRC TAPER PT (NEEDLE) ×2 IMPLANT
NS IRRIG 1000ML POUR BTL (IV SOLUTION) ×3 IMPLANT
PACK TOTAL JOINT (CUSTOM PROCEDURE TRAY) ×2 IMPLANT
PAD ARMBOARD 7.5X6 YLW CONV (MISCELLANEOUS) ×2 IMPLANT
PASSER SUT CAPTURE FIRST (SUTURE) IMPLANT
PASSER SUT SWANSON 36MM LOOP (INSTRUMENTS) ×1 IMPLANT
RASP SM TEAR CROSS CUT (RASP) ×1 IMPLANT
SET BASIN LINEN APH (SET/KITS/TRAYS/PACK) ×2 IMPLANT
SLING ARM IMMOBILIZER LRG (SOFTGOODS) ×1 IMPLANT
SLING ARM IMMOBILIZER MED (SOFTGOODS) IMPLANT
STAPLER VISISTAT 35W (STAPLE) ×1 IMPLANT
SUT BONE WAX W31G (SUTURE) IMPLANT
SUT ETHIBOND NAB OS 4 #2 30IN (SUTURE) ×5 IMPLANT
SUT ETHILON 3 0 FSL (SUTURE) IMPLANT
SUT FIBERWIRE #2 38 T-5 BLUE (SUTURE) ×6
SUT MON AB 0 CT1 (SUTURE) ×2 IMPLANT
SUT MON AB 2-0 CT1 36 (SUTURE) ×2 IMPLANT
SUT PROLENE 3 0 PS 1 (SUTURE) IMPLANT
SUTURE FIBERWR #2 38 T-5 BLUE (SUTURE) IMPLANT
SYR 30ML LL (SYRINGE) ×2 IMPLANT
SYR BULB IRRIGATION 50ML (SYRINGE) ×2 IMPLANT
YANKAUER SUCT 12FT TUBE ARGYLE (SUCTIONS) ×2 IMPLANT

## 2018-06-15 NOTE — Interval H&P Note (Signed)
History and Physical Interval Note:  06/15/2018 7:19 AM  Donna Barron  has presented today for surgery, with the diagnosis of torn rotator cuff left  The various methods of treatment have been discussed with the patient and family. After consideration of risks, benefits and other options for treatment, the patient has consented to  Procedure(s): ROTATOR CUFF REPAIR SHOULDER OPEN (Left) as a surgical intervention .  The patient's history has been reviewed, patient examined, no change in status, stable for surgery.  I have reviewed the patient's chart and labs.  Questions were answered to the patient's satisfaction.     Arther Abbott

## 2018-06-15 NOTE — Transfer of Care (Signed)
Immediate Anesthesia Transfer of Care Note  Patient: Donna Barron  Procedure(s) Performed: ROTATOR CUFF REPAIR SHOULDER OPEN WITH CHROMEOPLASTY (Left Shoulder)  Patient Location: PACU  Anesthesia Type:General  Level of Consciousness: awake  Airway & Oxygen Therapy: Patient Spontanous Breathing and Patient connected to face mask oxygen  Post-op Assessment: Report given to RN  Post vital signs: Reviewed  Last Vitals:  Vitals Value Taken Time  BP 163/77 06/15/2018 10:02 AM  Temp    Pulse 89 06/15/2018 10:04 AM  Resp 15 06/15/2018 10:04 AM  SpO2 95 % 06/15/2018 10:04 AM  Vitals shown include unvalidated device data.  Last Pain:  Vitals:   06/15/18 0708  TempSrc: Oral  PainSc: 0-No pain      Patients Stated Pain Goal: 6 (98/42/10 3128)  Complications: No apparent anesthesia complications

## 2018-06-15 NOTE — Anesthesia Procedure Notes (Signed)
Anesthesia Regional Block: Interscalene brachial plexus block   Pre-Anesthetic Checklist: ,, timeout performed, Correct Patient, Correct Site, Correct Laterality, Correct Procedure, Correct Position, site marked, Risks and benefits discussed, pre-op evaluation,  At surgeon's request and post-op pain management  Laterality: Upper and Left  Prep: chloraprep       Needles:  Injection technique: Single-shot      Needle Gauge: 25     Additional Needles:   Procedures:,,,, ultrasound used (permanent image in chart),,,,  Narrative:  Start time: 06/15/2018 7:31 AM End time: 06/15/2018 7:35 AM  Performed by: Personally  Anesthesiologist: Nicanor Alcon, MD  Additional Notes: Consent obtained from patient.   Block assessed prior to start of surgery

## 2018-06-15 NOTE — Brief Op Note (Signed)
06/15/2018  9:45 AM  PATIENT:  Donna Barron  78 y.o. female  PRE-OPERATIVE DIAGNOSIS:  torn rotator cuff left shoulder  POST-OPERATIVE DIAGNOSIS:  torn rotator cuff left shoulder  FINDINGS: Torn rotator cuff U-shaped tear 1-1/2 cm retraction delamination supraspinatus only  PROCEDURE:  Procedure(s): ROTATOR CUFF REPAIR SHOULDER OPEN WITH ACROMIOPLASTY (Left)  Implants 1 Arthrex swivel lock anchor  Surgery was done as follows The patient was given a block in the preop area by anesthesia please see their note She was taken to surgery for general anesthesia placed in the modified beachchair position with a sandbag under the left shoulder a brief exam under anesthesia showed no evidence of adhesive capsulitis.  She was prepped and draped sterilely timeout was completed  The incision was made over the anterolateral edge of the acromion extended distally and longitudinally subcutaneous tissue was divided.  The anterior third of the deltoid and middle deltoid were split up to the level of the acromion and then taken anteriorly and posteriorly in a Y-shaped fashion to remove the deltoid from the acromion.  A bursectomy was performed the tear was immediately visible there was a large acromial spur which was removed with a saw and contoured with a bur  After bursectomy the tendon was mobilized very easily we still went ahead and did superior and inferior releases which were minimal and then I placed 1 inverted mattress suture for margin convergence I placed 2 interrupted sutures to repair the delamination using FiberWire.  I then placed a inverted mattress suture in the posterior part of the tear and brought it anteriorly with a swivel lock anchor I took 1 of the sutures from the swivel lock anchor and placed an additional suture in the cuff and then I placed an anterior inverted mattress suture to close the anterior gap.  This gave an excellent repair the wound was irrigated and closed with drill holes  in the acromion and two #2 Ethibond sutures to repair the deltoid and then a #2 Ethibond suture was used to repair the deltoid split the wound was irrigated the subcutaneous tissue was injected with Marcaine with epinephrine a total of 10 cc and then a 2 layer closure using 0 Monocryl 2-0 Monocryl was used to close the skin and then skin staples were applied with a sterile dressing  I was assisted by Simonne Maffucci   the anesthesia was general  SURGEON:  Surgeon(s) and Role:    * Carole Civil, MD - Primary   EBL:  10 mL   BLOOD ADMINISTERED:none  DRAINS: none   LOCAL MEDICATIONS USED:  MARCAINE     SPECIMEN:  No Specimen  DISPOSITION OF SPECIMEN:  N/A  COUNTS:  YES  TOURNIQUET:  * No tourniquets in log *  DICTATION: .Dragon Dictation  PLAN OF CARE: Discharge to home after PACU  PATIENT DISPOSITION:  PACU - hemodynamically stable.   Delay start of Pharmacological VTE agent (>24hrs) due to surgical blood loss or risk of bleeding: not applicable

## 2018-06-15 NOTE — Anesthesia Procedure Notes (Signed)
Procedure Name: Intubation Date/Time: 06/15/2018 7:53 AM Performed by: Ollen Bowl, CRNA Pre-anesthesia Checklist: Patient identified, Patient being monitored, Timeout performed, Emergency Drugs available and Suction available Patient Re-evaluated:Patient Re-evaluated prior to induction Oxygen Delivery Method: Circle system utilized Preoxygenation: Pre-oxygenation with 100% oxygen Induction Type: IV induction Ventilation: Mask ventilation without difficulty Laryngoscope Size: Mac and 3 Grade View: Grade I Tube type: Oral Tube size: 7.0 mm Number of attempts: 1 Airway Equipment and Method: Stylet Placement Confirmation: ETT inserted through vocal cords under direct vision,  positive ETCO2 and breath sounds checked- equal and bilateral Secured at: 21 cm Tube secured with: Tape Dental Injury: Teeth and Oropharynx as per pre-operative assessment

## 2018-06-15 NOTE — Op Note (Signed)
06/15/2018  9:45 AM  PATIENT:  Donna Barron  78 y.o. female  PRE-OPERATIVE DIAGNOSIS:  torn rotator cuff left shoulder  POST-OPERATIVE DIAGNOSIS:  torn rotator cuff left shoulder  FINDINGS: Torn rotator cuff U-shaped tear 1-1/2 cm retraction delamination supraspinatus only  PROCEDURE:  Procedure(s): ROTATOR CUFF REPAIR SHOULDER OPEN WITH ACROMIOPLASTY (Left)-23412  Implants 1 Arthrex swivel lock anchor  Surgery was done as follows The patient was given a block in the preop area by anesthesia please see their note She was taken to surgery for general anesthesia placed in the modified beachchair position with a sandbag under the left shoulder a brief exam under anesthesia showed no evidence of adhesive capsulitis.  She was prepped and draped sterilely timeout was completed  The incision was made over the anterolateral edge of the acromion extended distally and longitudinally subcutaneous tissue was divided.  The anterior third of the deltoid and middle deltoid were split up to the level of the acromion and then taken anteriorly and posteriorly in a Y-shaped fashion to remove the deltoid from the acromion.  A bursectomy was performed the tear was immediately visible there was a large acromial spur which was removed with a saw and contoured with a bur  After bursectomy the tendon was mobilized very easily we still went ahead and did superior and inferior releases which were minimal and then I placed 1 inverted mattress suture for margin convergence I placed 2 interrupted sutures to repair the delamination using FiberWire.  I then placed a inverted mattress suture in the posterior part of the tear and brought it anteriorly with a swivel lock anchor I took 1 of the sutures from the swivel lock anchor and placed an additional suture in the cuff and then I placed an anterior inverted mattress suture to close the anterior gap.  This gave an excellent repair the wound was irrigated and closed with drill  holes in the acromion and two #2 Ethibond sutures to repair the deltoid and then a #2 Ethibond suture was used to repair the deltoid split the wound was irrigated the subcutaneous tissue was injected with Marcaine with epinephrine a total of 10 cc and then a 2 layer closure using 0 Monocryl 2-0 Monocryl was used to close the skin and then skin staples were applied with a sterile dressing  I was assisted by Simonne Maffucci   the anesthesia was general  SURGEON:  Surgeon(s) and Role:    * Carole Civil, MD - Primary   EBL:  10 mL   BLOOD ADMINISTERED:none  DRAINS: none   LOCAL MEDICATIONS USED:  MARCAINE     SPECIMEN:  No Specimen  DISPOSITION OF SPECIMEN:  N/A  COUNTS:  YES  TOURNIQUET:  * No tourniquets in log *  DICTATION: .Dragon Dictation  PLAN OF CARE: Discharge to home after PACU  PATIENT DISPOSITION:  PACU - hemodynamically stable.   Delay start of Pharmacological VTE agent (>24hrs) due to surgical blood loss or risk of bleeding: not applicable

## 2018-06-15 NOTE — Anesthesia Postprocedure Evaluation (Signed)
Anesthesia Post Note  Patient: Donna Barron  Procedure(s) Performed: ROTATOR CUFF REPAIR SHOULDER OPEN WITH CHROMEOPLASTY (Left Shoulder)  Patient location during evaluation: PACU Anesthesia Type: General Level of consciousness: awake and alert and oriented Pain management: pain level controlled Vital Signs Assessment: post-procedure vital signs reviewed and stable Respiratory status: spontaneous breathing Cardiovascular status: blood pressure returned to baseline and stable Postop Assessment: no apparent nausea or vomiting Anesthetic complications: no     Last Vitals:  Vitals:   06/15/18 1045 06/15/18 1058  BP: (!) 141/76 (!) 167/96  Pulse: 86 90  Resp: 15 15  Temp:  37.3 C  SpO2: 90% 90%    Last Pain:  Vitals:   06/15/18 1058  TempSrc: Oral  PainSc: 0-No pain                 [name]

## 2018-06-15 NOTE — Progress Notes (Signed)
Pt pupils assymetrical, right 4 and left 1 but both reactive. Left eye lid drooping. Daughter states her right side mouth is drooping and right cheek swollen. Pt is moving all extremities well/a&o/no asymmetry noted to mouth. No obvious swleling noted to right side face. DR Rick Duff in to talk with pt and family and reassured pt/daughter. Pt dc NAD. Able to move right hand and arm. Drinking well.

## 2018-06-15 NOTE — Anesthesia Preprocedure Evaluation (Signed)
Anesthesia Evaluation  Patient identified by MRN, date of birth, ID band Patient awake    Reviewed: Allergy & Precautions, H&P , NPO status , Patient's Chart, lab work & pertinent test results, reviewed documented beta blocker date and time   History of Anesthesia Complications (+) PONV and history of anesthetic complications  Airway Mallampati: II  TM Distance: >3 FB Neck ROM: full    Dental no notable dental hx. (+) Edentulous Upper, Partial Lower   Pulmonary neg pulmonary ROS,    Pulmonary exam normal breath sounds clear to auscultation       Cardiovascular Exercise Tolerance: Good hypertension, + dysrhythmias  Rhythm:regular Rate:Normal     Neuro/Psych PSYCHIATRIC DISORDERS Depression  Neuromuscular disease    GI/Hepatic Neg liver ROS, GERD  ,  Endo/Other  negative endocrine ROS  Renal/GU negative Renal ROS  negative genitourinary   Musculoskeletal   Abdominal   Peds  Hematology negative hematology ROS (+)   Anesthesia Other Findings   Reproductive/Obstetrics negative OB ROS                             Anesthesia Physical Anesthesia Plan  ASA: III  Anesthesia Plan: General   Post-op Pain Management:    Induction:   PONV Risk Score and Plan:   Airway Management Planned:   Additional Equipment:   Intra-op Plan:   Post-operative Plan:   Informed Consent: I have reviewed the patients History and Physical, chart, labs and discussed the procedure including the risks, benefits and alternatives for the proposed anesthesia with the patient or authorized representative who has indicated his/her understanding and acceptance.     Plan Discussed with: CRNA  Anesthesia Plan Comments:         Anesthesia Quick Evaluation

## 2018-06-19 ENCOUNTER — Encounter: Payer: Self-pay | Admitting: Orthopedic Surgery

## 2018-06-19 ENCOUNTER — Ambulatory Visit (INDEPENDENT_AMBULATORY_CARE_PROVIDER_SITE_OTHER): Payer: Medicare Other | Admitting: Orthopedic Surgery

## 2018-06-19 DIAGNOSIS — Z9889 Other specified postprocedural states: Secondary | ICD-10-CM

## 2018-06-19 MED ORDER — TRAMADOL HCL 50 MG PO TABS: 50.0000 mg | ORAL_TABLET | Freq: Four times a day (QID) | ORAL | 5 refills | Status: DC | PRN

## 2018-06-19 MED ORDER — IBUPROFEN 800 MG PO TABS: 800.0000 mg | ORAL_TABLET | Freq: Three times a day (TID) | ORAL | 1 refills | Status: DC | PRN

## 2018-06-19 NOTE — Progress Notes (Signed)
POSTOP VISIT  POD # 4  Chief Complaint  Patient presents with  . Routine Post Op    Lt RCR DOS 06/15/18    Rotator cuff repair left shoulder with suture anchors.  It was a 1-1/2 cm retraction with delamination supraspinatus only we used swivel locks  She complains of severe pain however she has not been able to take hydrocodone because of allergy she started having swelling of her lips and had to go back onto tramadol.  She is taking tizanidine but no ibuprofen  Wound looks fine  Medication will be changed patient placed in sling shot  Encounter Diagnosis  Name Primary?  . S/P left rotator cuff repair 06/15/18     Postoperative plan (Work, [employer],  Meds ordered this encounter  Medications  . traMADol (ULTRAM) 50 MG tablet    Sig: Take 1 tablet (50 mg total) by mouth every 6 (six) hours as needed.    Dispense:  60 tablet    Refill:  5  . ibuprofen (ADVIL,MOTRIN) 800 MG tablet    Sig: Take 1 tablet (800 mg total) by mouth every 8 (eight) hours as needed.    Dispense:  90 tablet    Refill:  1  ,FU)  Staples to come out next week

## 2018-06-20 ENCOUNTER — Encounter (HOSPITAL_COMMUNITY): Payer: Self-pay | Admitting: Orthopedic Surgery

## 2018-06-28 ENCOUNTER — Ambulatory Visit: Payer: Medicare Other

## 2018-06-28 ENCOUNTER — Ambulatory Visit (INDEPENDENT_AMBULATORY_CARE_PROVIDER_SITE_OTHER): Payer: Medicare Other | Admitting: Orthopedic Surgery

## 2018-06-28 ENCOUNTER — Encounter: Payer: Self-pay | Admitting: Orthopedic Surgery

## 2018-06-28 VITALS — BP 122/73 | HR 80 | Ht 62.0 in | Wt 206.0 lb

## 2018-06-28 DIAGNOSIS — Z9889 Other specified postprocedural states: Secondary | ICD-10-CM

## 2018-06-28 MED ORDER — ACETAMINOPHEN-CODEINE #3 300-30 MG PO TABS: 1.0000 | ORAL_TABLET | Freq: Four times a day (QID) | ORAL | 0 refills | Status: DC | PRN

## 2018-06-28 MED ORDER — TIZANIDINE HCL 4 MG PO TABS: 4.0000 mg | ORAL_TABLET | Freq: Three times a day (TID) | ORAL | 0 refills | Status: AC

## 2018-06-28 NOTE — Progress Notes (Signed)
Chief Complaint  Patient presents with  . Routine Post Op    Lt RCR 06/15/18    Irving sutures came out today/staples were removed wound looks great.  She still having a lot of pain because she cannot take the tramadol or the hydrocodone.  We did settle on some Tylenol 3 which she tolerated well after her hand surgery  She is very emotional today because scared to be left alone does not really have a reason why  We think it is because she is in pain  We will see her next week we will start the Tylenol with codeine  Meds ordered this encounter  Medications  . acetaminophen-codeine (TYLENOL #3) 300-30 MG tablet    Sig: Take 1 tablet by mouth every 6 (six) hours as needed for moderate pain.    Dispense:  30 tablet    Refill:  0  . tiZANidine (ZANAFLEX) 4 MG tablet    Sig: Take 1 tablet (4 mg total) by mouth 3 (three) times daily for 14 days.    Dispense:  42 tablet    Refill:  0

## 2018-07-05 ENCOUNTER — Ambulatory Visit (INDEPENDENT_AMBULATORY_CARE_PROVIDER_SITE_OTHER): Payer: Medicare Other | Admitting: Orthopedic Surgery

## 2018-07-05 ENCOUNTER — Encounter: Payer: Self-pay | Admitting: Orthopedic Surgery

## 2018-07-05 VITALS — BP 102/63 | HR 77 | Ht 62.0 in | Wt 206.0 lb

## 2018-07-05 DIAGNOSIS — Z9889 Other specified postprocedural states: Secondary | ICD-10-CM

## 2018-07-05 MED ORDER — ACETAMINOPHEN-CODEINE #3 300-30 MG PO TABS: 1.0000 | ORAL_TABLET | Freq: Four times a day (QID) | ORAL | 0 refills | Status: DC | PRN

## 2018-07-05 NOTE — Patient Instructions (Signed)
START THERAPY AT [facility]

## 2018-07-05 NOTE — Addendum Note (Signed)
Addended byCandice Camp on: 07/05/2018 04:50 PM   Modules accepted: Orders

## 2018-07-05 NOTE — Progress Notes (Signed)
Chief Complaint  Patient presents with  . Post-op Follow-up    left RCR 06/15/18    Postop day 20 rotator cuff repair left shoulder postop course complicated by labile emotional state seems to be resolved at this point she is currently on Tylenol 3 tizanidine and ibuprofen  Her wound looks clean she can start therapy at [facility] follow-up in 3 weeks she can drive in 1 week  Encounter Diagnosis  Name Primary?  . S/P left rotator cuff repair 06/15/18 Yes

## 2018-07-06 ENCOUNTER — Telehealth: Payer: Self-pay | Admitting: Orthopedic Surgery

## 2018-07-06 NOTE — Telephone Encounter (Signed)
Faxed her the protocol

## 2018-07-06 NOTE — Telephone Encounter (Signed)
Katie from [employer] in [place] called and left a message asking if Dr. Aline Brochure has a specific protocol for this patient, post op rotator cuff repair  Please call her back at [phone] or fax protocol to [phone]  Thanks

## 2018-07-26 ENCOUNTER — Encounter: Payer: Self-pay | Admitting: Orthopedic Surgery

## 2018-07-26 ENCOUNTER — Ambulatory Visit (INDEPENDENT_AMBULATORY_CARE_PROVIDER_SITE_OTHER): Payer: Medicare Other | Admitting: Orthopedic Surgery

## 2018-07-26 VITALS — BP 99/61 | HR 70 | Ht 62.0 in | Wt 206.0 lb

## 2018-07-26 DIAGNOSIS — Z9889 Other specified postprocedural states: Secondary | ICD-10-CM

## 2018-07-26 MED ORDER — IBUPROFEN 800 MG PO TABS: 800.0000 mg | ORAL_TABLET | Freq: Three times a day (TID) | ORAL | 1 refills | Status: DC | PRN

## 2018-07-26 MED ORDER — ACETAMINOPHEN-CODEINE #3 300-30 MG PO TABS: 1.0000 | ORAL_TABLET | Freq: Four times a day (QID) | ORAL | 0 refills | Status: DC | PRN

## 2018-07-26 NOTE — Progress Notes (Signed)
GLOBAL PERIOD POST OP [facility]   POD # 41  Encounter Diagnosis  Name Primary?  . S/P left rotator cuff repair 06/15/18 Yes    6 wk post op visit   She is much better   Her [facility] notes show improvement   Use the sling as needed  Continue therapy   Ok to drive   F/u 6 weeks

## 2018-07-26 NOTE — Patient Instructions (Signed)
Use the sling as needed  Continue therapy   Ok to drive

## 2018-09-06 ENCOUNTER — Telehealth: Payer: Self-pay | Admitting: Orthopedic Surgery

## 2018-09-06 ENCOUNTER — Ambulatory Visit: Payer: Medicare Other | Admitting: Orthopedic Surgery

## 2018-09-06 NOTE — Telephone Encounter (Signed)
Postop visit checkup by phone  Patient had a rotator cuff repair January 2  She is doing well except for some posterior pain in the left shoulder  She is not taking any medicine other than Tylenol  She is on a home exercise program as she completed her physical therapy.  She says she is moving her arm well  Plan  Recommend ibuprofen as needed Continue home exercise program Schedule a follow-up visit for May  If COVID-19 restrictions are lifted we will see her in the office if not we will do a phone telemedicine appointment

## 2018-10-16 ENCOUNTER — Ambulatory Visit: Payer: Self-pay | Admitting: Orthopedic Surgery

## 2018-10-20 ENCOUNTER — Ambulatory Visit (INDEPENDENT_AMBULATORY_CARE_PROVIDER_SITE_OTHER): Payer: Medicare Other | Admitting: Orthopedic Surgery

## 2018-10-20 ENCOUNTER — Encounter: Payer: Self-pay | Admitting: Orthopedic Surgery

## 2018-10-20 ENCOUNTER — Other Ambulatory Visit: Payer: Self-pay

## 2018-10-20 VITALS — BP 109/69 | HR 84 | Temp 96.7°F | Ht 62.0 in | Wt 206.0 lb

## 2018-10-20 DIAGNOSIS — Z9889 Other specified postprocedural states: Secondary | ICD-10-CM | POA: Diagnosis not present

## 2018-10-20 DIAGNOSIS — M4722 Other spondylosis with radiculopathy, cervical region: Secondary | ICD-10-CM | POA: Diagnosis not present

## 2018-10-20 MED ORDER — GABAPENTIN 100 MG PO CAPS: 100.0000 mg | ORAL_CAPSULE | Freq: Every day | ORAL | 2 refills | Status: DC

## 2018-10-20 MED ORDER — PREDNISONE 10 MG PO TABS: 10.0000 mg | ORAL_TABLET | Freq: Three times a day (TID) | ORAL | 0 refills | Status: DC

## 2018-10-20 NOTE — Progress Notes (Signed)
Chief Complaint  Patient presents with  . Routine Post Op    Lt Shoulder 06/15/18   Left rotator cuff repair stiffness   Pain in the left shoulder around the subacromial region she is still very stiff her flexion is 110 degrees active 150 passive but pain between 110 and 150  I gave her an injection subacromial space  Continue exercises   New problem right arm  Radicular pain X several weeks this is associated with numbness tingling and weakness of the right upper extremity pain runs right down the arm. Pain levels 3-4 out of 10 Dull ache   X-ray from 2019: X-ray report at [facility]  Complaint neck and shoulder pain  X-ray shows loss of cervical lordosis mid cervical degenerative disc disease at 5 6 and 6 7 anterior osteophytes are noted at 456 and 7 facet joints look normal her graph [facility] x-ray shows asymmetry and coronal plane malalignment with uncovertebral joint arthrosis  Impression moderate cervical spondylosis  Past Medical History:  Diagnosis Date  . Depression   . Dysrhythmia    heart palipations  . GERD (gastroesophageal reflux disease)   . HTN (hypertension)   . Hypercholesteremia   . PONV (postoperative nausea and vomiting)     Review of Systems  Constitutional: Negative for chills, fever and malaise/fatigue.  Respiratory: Negative for cough.    BP 109/69   Pulse 84   Temp (!) 96.7 F (35.9 C)   Ht 5\' 2"  (1.575 m)   Wt 206 lb (93.4 kg)   BMI 37.68 kg/m  Nylene looks good otherwise.  She is awake alert and oriented x3 mood and affect are normal no gait disturbance  Left shoulder surgical incision healed nicely barely visible she is tender around the peri-acromial region subacromial space especially posteriorly active range of motion 110 flexion 150 passive shoulder is stable her cuff strength seems good mild weakness her skin is clean dry and intact her pulses good her lymph nodes are negative she has no  sensory deficits on the left side    On the right side including the neck exam she has tenderness in the cervical spine midline as it reaches the thoracic region she has tenderness in the muscles including the trapezius muscle with decreased range of motion negative Spurling sign her reflexes are 2+ and equal intact bilaterally she has good grip strength   Encounter Diagnoses  Name Primary?  . S/P left rotator cuff repair 06/15/18   . Cervical spondylosis with radiculopathy Yes    Procedure note the subacromial injection shoulder left   Verbal consent was obtained to inject the  Left   Shoulder  Timeout was completed to confirm the injection site is a subacromial space of the  left  shoulder  Medication used Depo-Medrol 40 mg and lidocaine 1% 3 cc  Anesthesia was provided by ethyl chloride  The injection was performed in the left  posterior subacromial space. After pinning the skin with alcohol and anesthetized the skin with ethyl chloride the subacromial space was injected using a 20-gauge needle. There were no complications  Sterile dressing was applied.   Meds ordered this encounter  Medications  . gabapentin (NEURONTIN) 100 MG capsule    Sig: Take 1 capsule (100 mg total) by mouth at bedtime.    Dispense:  30 capsule    Refill:  2  . predniSONE (DELTASONE) 10 MG tablet    Sig: Take 1 tablet (10 mg total) by mouth 3 (  three) times daily.    Dispense:  60 tablet    Refill:  0   Follow-up 4 weeks

## 2018-11-17 ENCOUNTER — Other Ambulatory Visit: Payer: Self-pay

## 2018-11-17 ENCOUNTER — Ambulatory Visit (INDEPENDENT_AMBULATORY_CARE_PROVIDER_SITE_OTHER): Payer: Medicare Other | Admitting: Orthopedic Surgery

## 2018-11-17 ENCOUNTER — Encounter: Payer: Self-pay | Admitting: Orthopedic Surgery

## 2018-11-17 VITALS — BP 117/77 | HR 77 | Temp 97.6°F | Ht 62.0 in | Wt 198.0 lb

## 2018-11-17 DIAGNOSIS — M4722 Other spondylosis with radiculopathy, cervical region: Secondary | ICD-10-CM

## 2018-11-17 DIAGNOSIS — Z9889 Other specified postprocedural states: Secondary | ICD-10-CM

## 2018-11-17 NOTE — Progress Notes (Signed)
No chief complaint on file.   91 rotator cuff repair in January doing well she is regained full range of motion no pain left shoulder  On her last visit she was having symptoms of radiculopathy in the right upper extremity we diagnosed her with cervical radiculopathy from degenerative disc disease of the cervical spine that too has improved and she has no symptoms  Donna Barron had a left rotator cuff repair June 15, 2018 last visit she had active flexion of 110 passive is 150 with injection required subacromial space for pain and bursitis  She also had new onset pain right arm diagnosed as cervical spondylosis with radicular pain x-ray showing severe loss of cervical lordosis and cervical disc disease at C5 and 6 C6 and 7 anterior osteophytes noted C4-C7  Past Medical History:  Diagnosis Date  . Depression   . Dysrhythmia    heart palipations  . GERD (gastroesophageal reflux disease)   . HTN (hypertension)   . Hypercholesteremia   . PONV (postoperative nausea and vomiting)    Review of Systems  Neurological: Negative for tingling, tremors and sensory change.  Psychiatric/Behavioral: The patient is nervous/anxious.    Physical Exam Musculoskeletal:       Arms:    Encounter Diagnoses  Name Primary?  . S/P left rotator cuff repair 06/15/18 Yes  . Cervical spondylosis with radiculopathy    She is advised to resume normal activities and follow-up with [place] as needed

## 2018-12-27 ENCOUNTER — Other Ambulatory Visit: Payer: Self-pay

## 2018-12-27 DIAGNOSIS — Z20822 Contact with and (suspected) exposure to covid-19: Secondary | ICD-10-CM

## 2018-12-30 LAB — NOVEL CORONAVIRUS, NAA: SARS-CoV-2, NAA: NOT DETECTED

## 2019-07-30 ENCOUNTER — Ambulatory Visit: Payer: Medicare Other | Admitting: Orthopedic Surgery

## 2019-08-03 ENCOUNTER — Ambulatory Visit: Payer: Medicare Other | Admitting: Orthopedic Surgery

## 2019-08-14 ENCOUNTER — Encounter: Payer: Self-pay | Admitting: Nurse Practitioner

## 2019-08-22 ENCOUNTER — Ambulatory Visit: Payer: Medicare Other | Admitting: Nurse Practitioner

## 2019-08-22 ENCOUNTER — Encounter: Payer: Self-pay | Admitting: Nurse Practitioner

## 2019-09-06 IMAGING — MR MR SHOULDER*L* W/O CM
4 of 5 series · 19 of 40 positions shown · non-contrast
Comparison: Plain films left shoulder 03/24/2018.

CLINICAL DATA: Left shoulder pain for 1 year.  No known injury.

EXAM:
MRI OF THE LEFT SHOULDER WITHOUT CONTRAST
TECHNIQUE: Multiplanar, multisequence MR imaging of the shoulder was performed.
No intravenous contrast was administered.

[Series 3: pdfs axial · axial · 4.0mm · 0.25mm/px · z∈[-48,+28]mm · 4 of 20 slices shown]
[im 1/20]
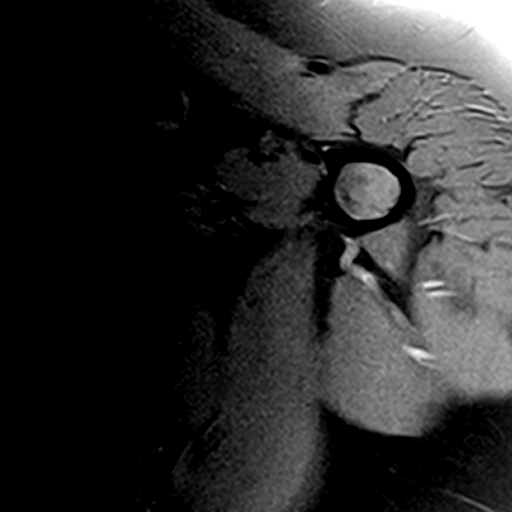
[im 3/20]
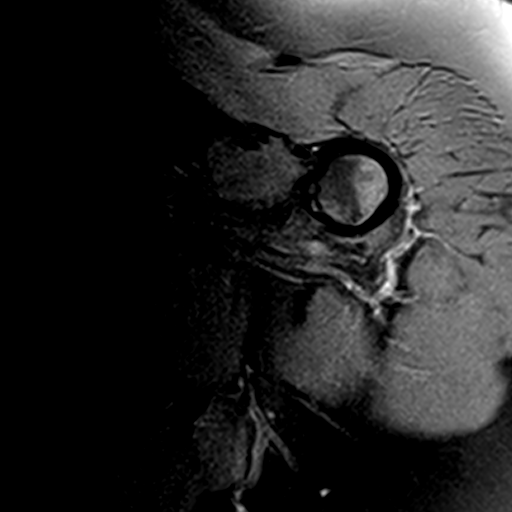
[im 11/20]
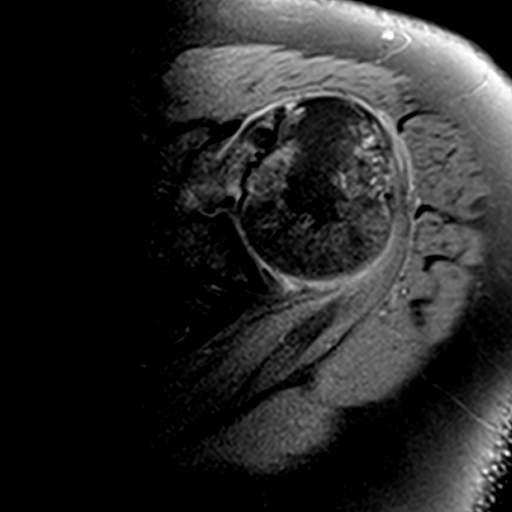
[im 17/20]
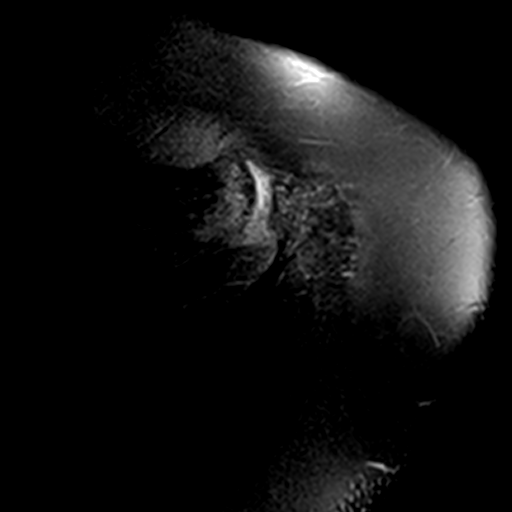

[Series 4: T1 · oblique · 4.0mm · 0.22mm/px · 9 of 24 slices shown]
[im 1/24]
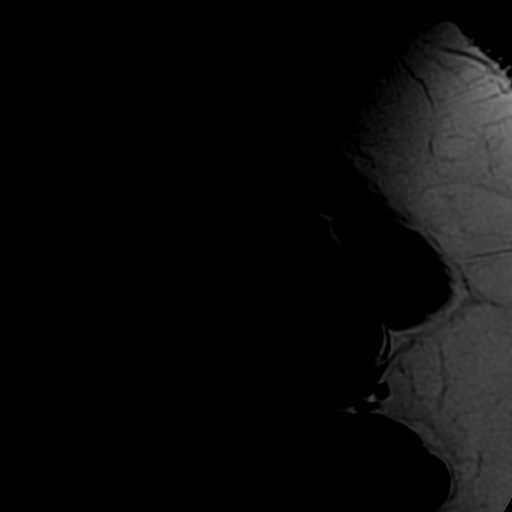
[im 3/24]
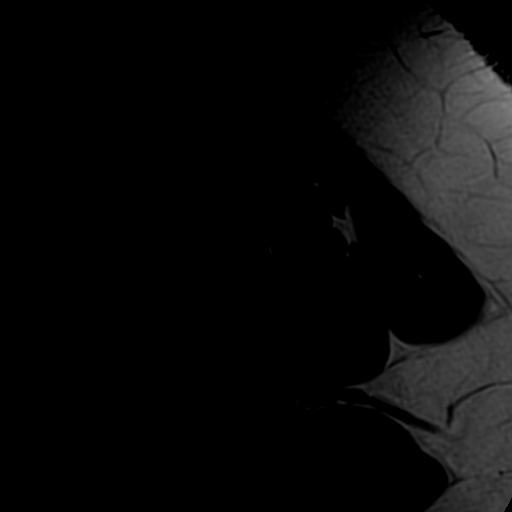
[im 6/24]
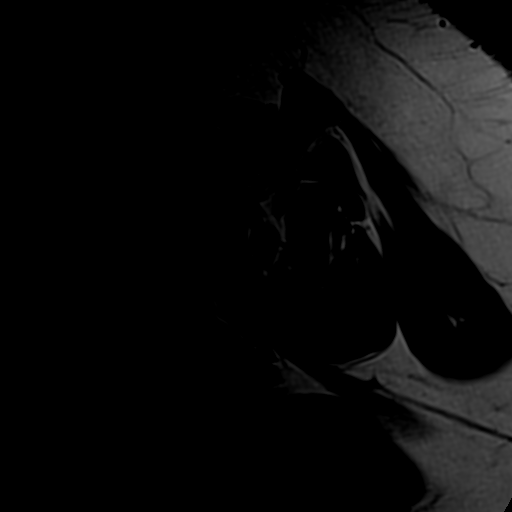
[im 9/24]
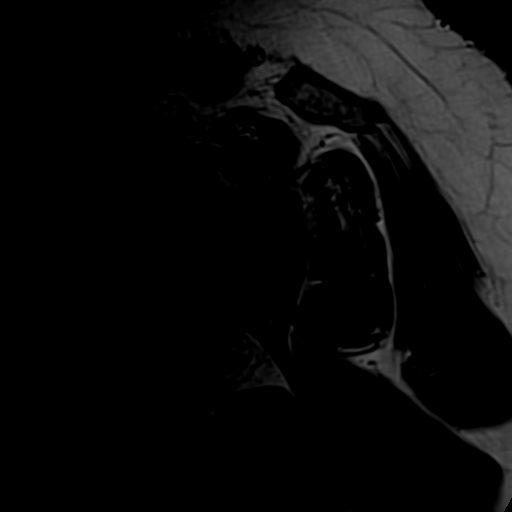
[im 12/24]
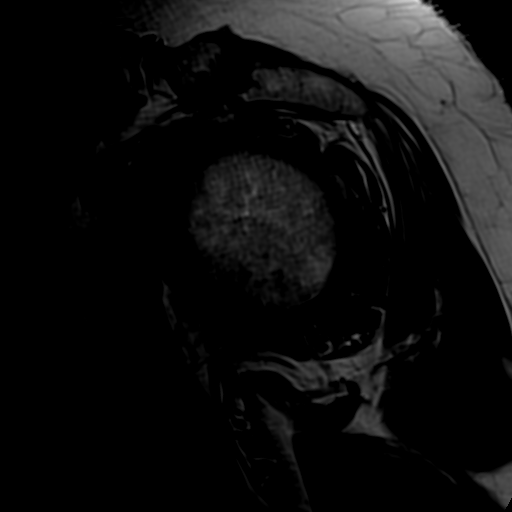
[im 15/24]
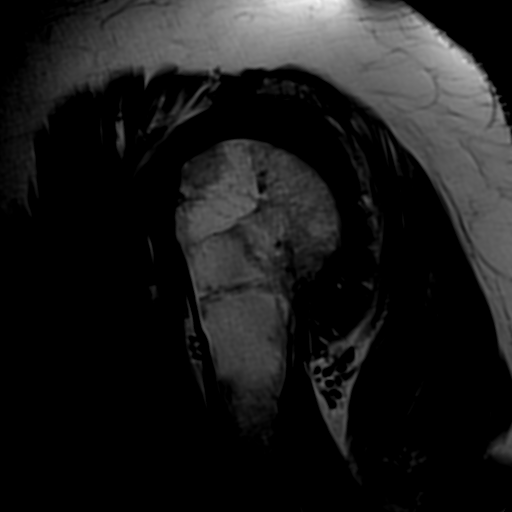
[im 18/24]
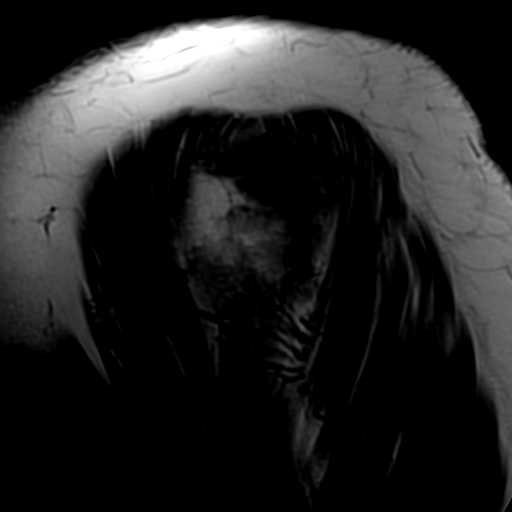
[im 21/24]
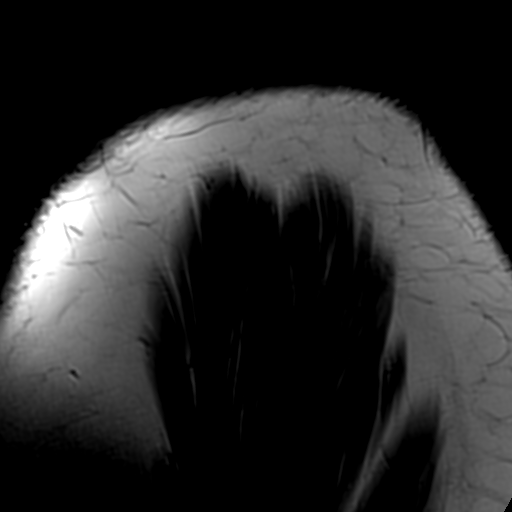
[im 24/24]
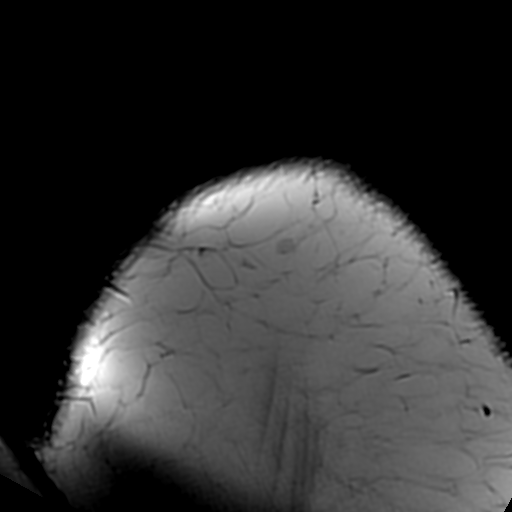

[Series 5: t2fs sagital · oblique · 4.0mm · 0.22mm/px · 3 of 24 slices shown]
[im 3/24]
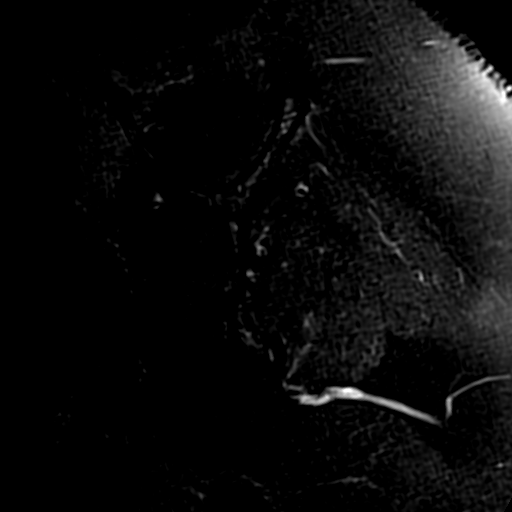
[im 12/24]
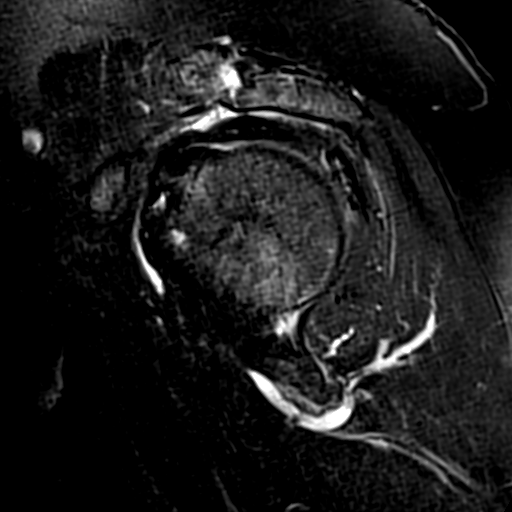
[im 21/24]
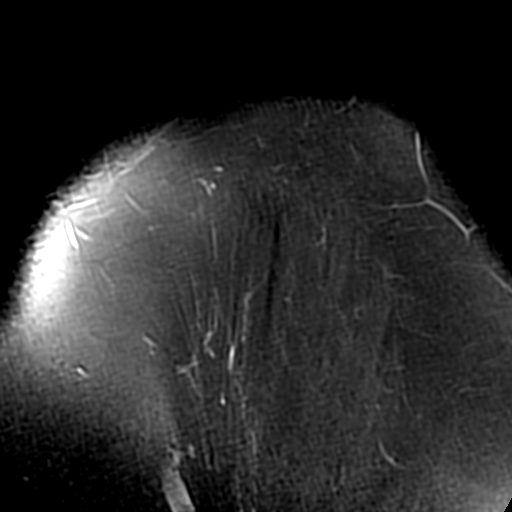

[Series 6: t2fs coronal · oblique · 4.0mm · 0.24mm/px · 3 of 18 slices shown]
[im 3/18]
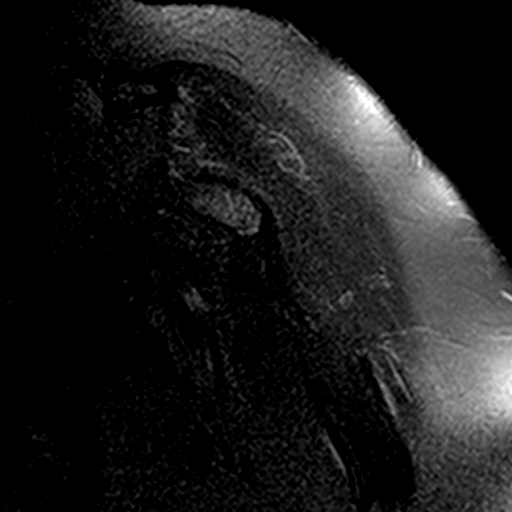
[im 9/18]
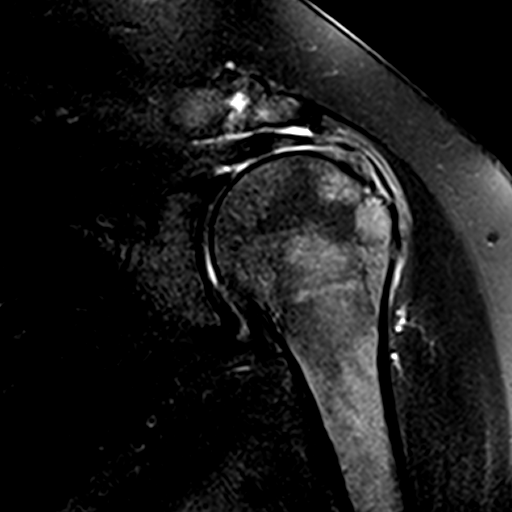
[im 15/18]
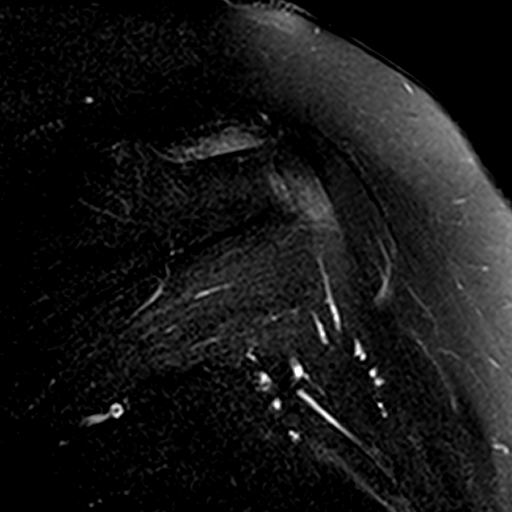

[19 of 40 positions shown; findings below may reference images not displayed]

FINDINGS: Rotator cuff: The patient has a near complete supraspinatus tendon
tear measuring approximately 1.4 cm from front to back. Tendon
retraction is to the top of the humeral head, 2-3 cm. Infraspinatus
and subscapularis tendinopathy without tear also identified.

Muscles:  No atrophy or focal lesion.

Biceps long head:  Intact.

Acromioclavicular Joint: Moderate degenerative change is seen. Type
2 acromion. Small volume of fluid in the subacromial/subdeltoid
bursa noted.

Glenohumeral Joint: Unremarkable.

Labrum:  Intact.

Bones:  No fracture or worrisome lesion.

Other: None.
IMPRESSION: Rotator cuff tendinopathy with a near complete supraspinatus tendon
tear measuring 1.4 cm from front to back with tendon retraction of
2-3 cm. No atrophy.

Moderate acromioclavicular osteoarthritis.

Small volume of subacromial/subdeltoid fluid suggestive of bursitis.

## 2019-09-10 NOTE — Progress Notes (Signed)
09/10/2019 Donna Barron [id number] 19-Nov-1940   CHIEF COMPLAINT:  Dysphagia. Positive Cologuard test.   HISTORY OF PRESENT ILLNESS:  Donna Barron is a 79 year old female with a past medical history of depression, hypertension, hypercholesterolemia, GERD and colon polyps.  DM II is listed in her history but she denies having diabetes. Past appendectomy and  cholecystectomy. S/P left rotator cuff repair 06/2018. She is accompanied by her daughter Nylene Nan who is a patient of Dr. Celesta Aver.  She presents today with complaints of dysphagia which has occurred intermittently for the past few months.  She describes having food such as bread getting stuck to the midesophagus which resulted in her vomiting to expel the stuck food.  Her dysphagia episodes occur 2-3 times weekly.  She denies having any significant heartburn.  No upper abdominal pain.  She is taking Pantoprazole 40 mg once daily.  She is taking ibuprofen 800 mg 1 tab 2 or 3 days weekly for aches and pains over the past few months.  She takes aspirin 81 mg daily.  She reports having an EGD by [facility] approximately 12 years ago, she does not recall if her esophagus required dilatation.  She underwent a colonoscopy at the same time which she reported having a few colon polyps.  Her brother was diagnosed with colon cancer at the age of 66 then died at the age of 83.  She is passing a normal formed brown bowel movements most does but has occasional diarrhea and constipation. No rectal bleeding or melena.   CBC Latest Ref Rng & Units 06/15/2018 06/09/2018 12/19/2013  WBC 4.0 - 10.5 K/uL - 7.5 8.7  Hemoglobin 12.0 - 15.0 g/dL 13.9 10.4(L) 15.2(H)  Hematocrit 36.0 - 46.0 % 41.0 35.9(L) 45.2  Platelets 150 - 400 K/uL - 451(H) 295   CMP Latest Ref Rng & Units 06/15/2018 06/09/2018 12/19/2013  Glucose 70 - 99 mg/dL 121(H) 129(H) 92  BUN 8 - 23 mg/dL 11 16 11   Creatinine 0.44 - 1.00 mg/dL 0.90 0.92 0.93  Sodium 135 - 145 mmol/L 135 137 141    Potassium 3.5 - 5.1 mmol/L 4.3 2.9(L) 3.9  Chloride 98 - 111 mmol/L 98 99 99  CO2 22 - 32 mmol/L - 29 32  Calcium 8.9 - 10.3 mg/dL - 8.7(L) 9.7    Past Medical History:  Diagnosis Date  . Depression   . Dysrhythmia    heart palipations  . GERD (gastroesophageal reflux disease)   . HTN (hypertension)   . Hypercholesteremia   . PONV (postoperative nausea and vomiting)    Past Surgical History:  Procedure Laterality Date  . APPENDECTOMY    . ARTHROSCOPIC REPAIR ACL  05/2008   Left knee  . CARPAL TUNNEL RELEASE Right 07/14/2017   Procedure: CARPAL TUNNEL RELEASE;  Surgeon: Carole Civil, MD;  Location: [facility];  Service: Orthopedics;  Laterality: Right;  . CHOLECYSTECTOMY    . DILATION AND CURETTAGE OF UTERUS    . DORSAL COMPARTMENT RELEASE Right 07/14/2017   Procedure: RELEASE DORSAL COMPARTMENT (DEQUERVAIN);  Surgeon: Carole Civil, MD;  Location: [facility];  Service: Orthopedics;  Laterality: Right;  . HYSTEROSCOPY WITH D & C N/A 12/25/2013   Procedure: DILATATION AND CURETTAGE /HYSTEROSCOPY;  Surgeon: Gus Height, MD;  Location: [facility];  Service: Gynecology;  Laterality: N/A;  . SHOULDER OPEN ROTATOR CUFF REPAIR Left 06/15/2018   Procedure: ROTATOR CUFF REPAIR SHOULDER OPEN WITH ACROMIOPLASTY;  Surgeon: Carole Civil, MD;  Location:  [facility];  Service: Orthopedics;  Laterality: Left;  . TUBAL LIGATION      Social history: The patient is widowed.  She is retired.  She has 2 sons and 2 daughters.  She is a non-smoker.  No alcohol use.  Family history: Mother with history of heart failure.  Father with history of lung cancer diagnosed at the age of 62.  Brother diagnosed with colon cancer at the age of 34 and died at age of 50.  Allergies  Allergen Reactions  . Hydrocodone     Lip swelling  . Avelox [Moxifloxacin Hcl In Nacl] Other (See Comments)    insomnia  . Codeine Swelling    Swelling of lips  . Oxybutynin Palpitations and Other (See Comments)    Tongue felt  coated       Outpatient Encounter Medications as of 09/11/2019  Medication Sig  . aspirin EC 81 MG tablet Take 81 mg by mouth daily.  Marland Kitchen escitalopram (LEXAPRO) 20 MG tablet Take 20 mg by mouth at bedtime.   . fluticasone (FLONASE) 50 MCG/ACT nasal spray Place 1 spray into both nostrils daily as needed for allergies.   Marland Kitchen gabapentin (NEURONTIN) 100 MG capsule Take 1 capsule (100 mg total) by mouth at bedtime.  Marland Kitchen ibuprofen (ADVIL,MOTRIN) 800 MG tablet Take 1 tablet (800 mg total) by mouth every 8 (eight) hours as needed.  Marland Kitchen LORazepam (ATIVAN) 0.5 MG tablet Take 1 tablet by mouth twice daily as needed for anxiety  . metoprolol succinate (TOPROL-XL) 50 MG 24 hr tablet Take 75 mg by mouth daily. Take with or immediately following a meal.  . pantoprazole (PROTONIX) 40 MG tablet Take 40 mg by mouth daily.   . potassium chloride (K-DUR) 10 MEQ tablet Take 20 mEq by mouth daily.  . predniSONE (DELTASONE) 10 MG tablet Take 1 tablet (10 mg total) by mouth 3 (three) times daily.  . simvastatin (ZOCOR) 40 MG tablet TAKE 1 TABLET BY MOUTH ONCE DAILY IN THE EVENING  . triamterene-hydrochlorothiazide (MAXZIDE) 75-50 MG per tablet Take 0.5 tablets by mouth daily.   Facility-Administered Encounter Medications as of 09/11/2019  Medication  . fentaNYL (SUBLIMAZE) injection 25-50 mcg  . meperidine (DEMEROL) injection 6.25-12.5 mg     REVIEW OF SYSTEMS: All other systems reviewed and negative except where noted in the History of Present Illness.   PHYSICAL EXAM: BP 140/62 (BP Location: Left Arm, Patient Position: Sitting, Cuff Size: Normal)   Pulse 100   Temp 99 F (37.2 C)   Ht 5' 0.5" (1.537 m) Comment: height measured without shoes  Wt 205 lb 6 oz (93.2 kg)   BMI 39.45 kg/m  General: Well developed 79 year old female in no acute distress. Head: Normocephalic and atraumatic. Eyes:  Sclerae non-icteric, conjunctive pink. Ears: Normal auditory acuity. Mouth: Upper dentures. Lower partials.  No ulcers  or lesions.  Neck: Supple, no lymphadenopathy or thyromegaly.  Lungs: Clear bilaterally to auscultation without wheezes, crackles or rhonchi. Heart: Regular rate and rhythm. No murmur, rub or gallop appreciated.  Abdomen: Soft, nontender, non distended. No masses. No hepatosplenomegaly. Normoactive bowel sounds x 4 quadrants.  Rectal: Deferred.  Musculoskeletal: Symmetrical with no gross deformities. Skin: Warm and dry. No rash or lesions on visible extremities. Extremities: Trace edema to the ankles bilaterally.  Neurological: Alert oriented x 4, no focal deficits.  Psychological:  Alert and cooperative. Normal mood and affect.  ASSESSMENT AND PLAN:  28. 79 year old female with dysphagia. -EGD benefits and risks discussed  including risk with sedation, risk of bleeding, perforation and infection  -Patient instructed to take 3 separate sips of water before she swallows any food or medications, cut food in small pieces and to chew food thoroughly -Reduce ibuprofen use -Patient to call our office if her symptoms worsen  2. Positive Cologuard Test. History of colon polyps. Family history of colon cancer (brother).  -Colonoscopy benefits and risks discussed including risk with sedation, risk of bleeding, perforation and infection  -Request EGD and colonoscopy 2012 records from [place]  The patient request to have her EGD and colonoscopy completed by Dr. Carlean Purl.  3. HTN, well controlled  Further follow-up to be determined after EGD and colonoscopy completed         CC:  Reed Breech, NP

## 2019-09-11 ENCOUNTER — Ambulatory Visit (INDEPENDENT_AMBULATORY_CARE_PROVIDER_SITE_OTHER): Payer: Medicare Other | Admitting: Nurse Practitioner

## 2019-09-11 ENCOUNTER — Other Ambulatory Visit: Payer: Self-pay

## 2019-09-11 ENCOUNTER — Encounter: Payer: Self-pay | Admitting: Nurse Practitioner

## 2019-09-11 VITALS — BP 140/62 | HR 100 | Temp 99.0°F | Ht 60.5 in | Wt 205.4 lb

## 2019-09-11 DIAGNOSIS — R195 Other fecal abnormalities: Secondary | ICD-10-CM | POA: Diagnosis not present

## 2019-09-11 DIAGNOSIS — R1310 Dysphagia, unspecified: Secondary | ICD-10-CM | POA: Diagnosis not present

## 2019-09-11 DIAGNOSIS — R1319 Other dysphagia: Secondary | ICD-10-CM

## 2019-09-11 NOTE — Patient Instructions (Signed)
If you are age 78 or older, your body mass index should be between 23-30. Your Body mass index is 39.45 kg/m. If this is out of the aforementioned range listed, please consider follow up with your Primary Care Provider.  If you are age 44 or younger, your body mass index should be between 19-25. Your Body mass index is 39.45 kg/m. If this is out of the aformentioned range listed, please consider follow up with your Primary Care Provider.   We will consult with Dr Carlean Purl regarding your  Colonoscopy and endoscopy and get back in contact with you.   Continue with your Pantoprazole 40mg  1 by mouth daily. Cut food in small pieces chew food throughly  Due to recent changes in healthcare laws, you may see the results of your imaging and laboratory studies on MyChart before your provider has had a chance to review them.  We understand that in some cases there may be results that are confusing or concerning to you. Not all laboratory results come back in the same time frame and the provider may be waiting for multiple results in order to interpret others.  Please give [place] 48 hours in order for your provider to thoroughly review all the results before contacting the office for clarification of your results.   Thank you for choosing [facility] Noralyn Pick, CRNP

## 2019-09-14 ENCOUNTER — Telehealth: Payer: Self-pay | Admitting: Nurse Practitioner

## 2019-09-14 DIAGNOSIS — R1319 Other dysphagia: Secondary | ICD-10-CM

## 2019-09-14 DIAGNOSIS — R195 Other fecal abnormalities: Secondary | ICD-10-CM

## 2019-09-14 DIAGNOSIS — R1310 Dysphagia, unspecified: Secondary | ICD-10-CM

## 2019-09-14 NOTE — Telephone Encounter (Signed)
Bre, pls contact pt to schedule EGD and colonoscopy with Dr. Carlean Purl. Refer to last office visit for orders. thx

## 2019-09-24 MED ORDER — POLYETHYLENE GLYCOL 3350 17 GM/SCOOP PO POWD: 1.0000 | Freq: Every day | ORAL | 3 refills | Status: DC

## 2019-09-24 NOTE — Telephone Encounter (Signed)
Called and spoke with patient's daughter-Tracy-verified DPR-Tracy advised of provider's recommendations and is agreeable to plan of care; patient has been scheduled on  10/16/2019 at 3:30 pm at [facility] with Dr. Carlean Purl; instructions have been mailed to the patient and has also been sent to the patient via mail;  Olivia Mackie advised to call back to the office at [phone] should questions/concerns arise;  Olivia Mackie verbalized understanding of information/instructions;

## 2019-10-16 ENCOUNTER — Encounter: Payer: Medicare Other | Admitting: Internal Medicine

## 2019-10-16 ENCOUNTER — Telehealth: Payer: Self-pay

## 2019-10-16 NOTE — Telephone Encounter (Signed)
Called daughter back and she explains that her mothers stomach and throat are so sore from vomiting that they would rather reschedule the double procedure for another day and do both together. The daughter says that she will call the office in a few days to do this.

## 2019-10-16 NOTE — Telephone Encounter (Signed)
Patients daughter called and said that her mother tried drinking the prep last night after taking the 4 pills and vomited several times. She tried again this morning and got 2 glasses down but has started vomiting again. Her daughter said she is going to the bathroom but it is a brown liquid with some solid material. MD notified, awaiting advice.

## 2019-10-16 NOTE — Telephone Encounter (Signed)
Would do EGd and will have to reschedule colon with different prep

## 2019-10-22 ENCOUNTER — Telehealth: Payer: Self-pay | Admitting: Internal Medicine

## 2019-10-22 NOTE — Telephone Encounter (Signed)
Spoke with Olivia Mackie the patients daughter. She has rescheduled her mother for ECL on 11/26/2019 and originally was scheduled  10/16/2019 with a Miralax split dose. The patient became very sick from this prep and was not able to come to her appointment. They were told we are going to give them a different prep for her next scheduled procedure. Please advise me of which prep you would like to use and I will get all instructions together for her.

## 2019-10-22 NOTE — Telephone Encounter (Signed)
Patient's daughter called rescheduled procedure but now is needing to know what prep medication she'll be doing

## 2019-10-22 NOTE — Telephone Encounter (Signed)
I would use Clen-piq if possible  It is ok and possibly helpful for her to do a pre-visit if they can  Thanks

## 2019-10-23 MED ORDER — CLENPIQ 10-3.5-12 MG-GM -GM/160ML PO SOLN: 1.0000 | ORAL | 0 refills | Status: DC

## 2019-10-23 NOTE — Telephone Encounter (Signed)
Left a detailed message on the patients daughters voicemail. I will send in a new script for Clenpiq and type the new instructions. The patient will need to have a pre-visit per Dr Carlean Purl. This will need to be scheduled.

## 2019-10-26 ENCOUNTER — Ambulatory Visit (AMBULATORY_SURGERY_CENTER): Payer: Self-pay | Admitting: *Deleted

## 2019-10-26 ENCOUNTER — Other Ambulatory Visit: Payer: Self-pay

## 2019-10-26 VITALS — Temp 97.1°F | Ht 60.5 in | Wt 201.0 lb

## 2019-10-26 DIAGNOSIS — R195 Other fecal abnormalities: Secondary | ICD-10-CM

## 2019-10-26 DIAGNOSIS — R1319 Other dysphagia: Secondary | ICD-10-CM

## 2019-10-26 DIAGNOSIS — R1310 Dysphagia, unspecified: Secondary | ICD-10-CM

## 2019-10-26 MED ORDER — SUTAB 1479-225-188 MG PO TABS: 1.0000 | ORAL_TABLET | ORAL | 0 refills | Status: DC

## 2019-10-26 NOTE — Progress Notes (Signed)
Patient is here in-person for PV  with daughter (temp 96.8) Patient denies any allergies to eggs or soy. Patient denies any problems with anesthesia/sedation. Patient denies any oxygen use at home. Patient denies taking any diet/weight loss medications or blood thinners. Patient is not being treated for MRSA or C-diff. Patient is aware of our care-partner policy and 0000000 safety protocol.   Patient states no changes in medical,surgical or medicines since GI OV.   Pt's daughter states clenpiq is $150. So they did not pick this up. No sample available so per V/O Dr.Gessner patient may use the Sutab. Pt agreeable to using the Sutab-sample given.

## 2019-11-25 ENCOUNTER — Encounter: Payer: Self-pay | Admitting: Certified Registered Nurse Anesthetist

## 2019-11-26 ENCOUNTER — Encounter: Payer: Medicare Other | Admitting: Internal Medicine

## 2019-11-26 ENCOUNTER — Telehealth: Payer: Self-pay | Admitting: Internal Medicine

## 2019-11-26 NOTE — Telephone Encounter (Signed)
Tried calling to confirm and obtain a reason for cancellation but there was no answer, left a voicemail to return call.

## 2019-11-26 NOTE — Telephone Encounter (Signed)
Message left that I called (to daughter Linus Orn)

## 2019-11-26 NOTE — Telephone Encounter (Signed)
Pt's daughter called and stated that pt is not willing to have procedure due to fear of urinating on the nurses.  Pt did not prep for the procedure.

## 2019-12-03 NOTE — Telephone Encounter (Signed)
Spoke to daughter Quincy Simmonds that urinary incontinence may occur but we can let her mom empty bladder right before and put a towel or pad in place to reduce the problem  So please call them tomorrow and reschedule EGD and colonoscopy  She still has prep materials  Thanks

## 2019-12-04 DIAGNOSIS — N3281 Overactive bladder: Secondary | ICD-10-CM | POA: Insufficient documentation

## 2019-12-04 NOTE — Telephone Encounter (Signed)
Spoke with daughter Linus Orn) and rescheduled EGD/Colon for 12/14/19 at 10:00. Patient to arrive at 9:00am. Reschedule from 11/26/19 so patient already had pre-visit and Prep. Reviewed Prep instructions again with Tracey. Patient had COVID vaccines in March.

## 2019-12-14 ENCOUNTER — Other Ambulatory Visit: Payer: Self-pay

## 2019-12-14 ENCOUNTER — Ambulatory Visit (AMBULATORY_SURGERY_CENTER): Payer: Medicare Other | Admitting: Internal Medicine

## 2019-12-14 ENCOUNTER — Encounter: Payer: Self-pay | Admitting: Internal Medicine

## 2019-12-14 ENCOUNTER — Encounter: Payer: Medicare Other | Admitting: Internal Medicine

## 2019-12-14 VITALS — BP 118/56 | HR 75 | Temp 97.1°F | Resp 10 | Ht 60.0 in | Wt 205.0 lb

## 2019-12-14 DIAGNOSIS — K222 Esophageal obstruction: Secondary | ICD-10-CM | POA: Diagnosis not present

## 2019-12-14 DIAGNOSIS — D120 Benign neoplasm of cecum: Secondary | ICD-10-CM | POA: Diagnosis not present

## 2019-12-14 DIAGNOSIS — K2961 Other gastritis with bleeding: Secondary | ICD-10-CM

## 2019-12-14 DIAGNOSIS — R1310 Dysphagia, unspecified: Secondary | ICD-10-CM

## 2019-12-14 DIAGNOSIS — R195 Other fecal abnormalities: Secondary | ICD-10-CM

## 2019-12-14 DIAGNOSIS — K259 Gastric ulcer, unspecified as acute or chronic, without hemorrhage or perforation: Secondary | ICD-10-CM

## 2019-12-14 DIAGNOSIS — D123 Benign neoplasm of transverse colon: Secondary | ICD-10-CM

## 2019-12-14 DIAGNOSIS — D124 Benign neoplasm of descending colon: Secondary | ICD-10-CM | POA: Diagnosis not present

## 2019-12-14 DIAGNOSIS — K5730 Diverticulosis of large intestine without perforation or abscess without bleeding: Secondary | ICD-10-CM | POA: Diagnosis not present

## 2019-12-14 DIAGNOSIS — K317 Polyp of stomach and duodenum: Secondary | ICD-10-CM | POA: Diagnosis not present

## 2019-12-14 DIAGNOSIS — K449 Diaphragmatic hernia without obstruction or gangrene: Secondary | ICD-10-CM

## 2019-12-14 MED ORDER — SODIUM CHLORIDE 0.9 % IV SOLN: 500.0000 mL | INTRAVENOUS | Status: DC

## 2019-12-14 MED ORDER — FLUCONAZOLE 100 MG PO TABS: 100.0000 mg | ORAL_TABLET | Freq: Every day | ORAL | 0 refills | Status: AC

## 2019-12-14 NOTE — Progress Notes (Signed)
Called to room to assist during endoscopic procedure.  Patient ID and intended procedure confirmed with present staff. Received instructions for my participation in the procedure from the performing physician.  

## 2019-12-14 NOTE — Progress Notes (Signed)
pt tolerated well. VSS. awake and to recovery. Report given to RN. Bite block inserted and removed without trauma.,

## 2019-12-14 NOTE — Op Note (Signed)
[place] Patient Name: Donna Barron Procedure Date: 12/14/2019 9:52 AM MRN: [id number] Endoscopist: Gatha Mayer , MD Age: 79 Referring MD:  Date of Birth: August 10, 1940 Gender: Female Account #: [id number] Procedure:                Upper GI endoscopy Indications:              Dysphagia Medicines:                Propofol per Anesthesia, Monitored Anesthesia Care Procedure:                Pre-Anesthesia Assessment:                           - Prior to the procedure, a History and Physical                            was performed, and patient medications and                            allergies were reviewed. The patient's tolerance of                            previous anesthesia was also reviewed. The risks                            and benefits of the procedure and the sedation                            options and risks were discussed with the patient.                            All questions were answered, and informed consent                            was obtained. Prior Anticoagulants: The patient has                            taken no previous anticoagulant or antiplatelet                            agents. ASA Grade Assessment: III - A patient with                            severe systemic disease. After reviewing the risks                            and benefits, the patient was deemed in                            satisfactory condition to undergo the procedure.                           After obtaining informed consent, the endoscope was  passed under direct vision. Throughout the                            procedure, the patient's blood pressure, pulse, and                            oxygen saturations were monitored continuously. The                            Endoscope was introduced through the mouth, and                            advanced to the second part of duodenum. The upper                            GI endoscopy was  accomplished without difficulty.                            The patient tolerated the procedure well. Scope In: Scope Out: Findings:                 One benign-appearing, intrinsic moderate                            (circumferential scarring or stenosis; an endoscope                            may pass) stenosis was found at the                            gastroesophageal junction. The stenosis was                            traversed. A TTS dilator was passed through the                            scope. Dilation with an 18-19-20 mm balloon dilator                            was performed to 20 mm. The dilation site was                            examined and showed no change. Estimated blood                            loss: none.                           Patchy moderate inflammation with hemorrhage                            characterized by erosions, erythema and linear                            erosions was found in the cardia.  Biopsies were                            taken with a cold forceps for histology.                            Verification of patient identification for the                            specimen was done. Estimated blood loss was minimal.                           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                           A single 8 mm pedunculated polyp with stigmata of                            recent bleeding was found in the gastric body. The                            polyp was removed with a hot snare. Resection and                            retrieval were complete. Verification of patient                            identification for the specimen was done. To                            prevent bleeding after the polypectomy, two                            hemostatic clips were successfully placed (MR                            conditional).                           Diffuse, white plaques were found in the entire                             esophagus.                           The exam was otherwise without abnormality. Complications:            No immediate complications. Estimated Blood Loss:     Estimated blood loss was minimal. Impression:               - Benign-appearing esophageal stenosis. Dilated. No                            effect                           -  Gastritis with hemorrhage. Biopsied.                           - Large paraesophageal hernia.                           - A single gastric polyp. Resected and retrieved.                            Clips (MR conditional) were placed.                           - Esophageal plaques were found, consistent with                            candidiasis. Med list shows nystatin and                            fluconazole ? will clarify and consider treatment                            options                           - The examination was otherwise normal. Recommendation:           - Patient has a contact number available for                            emergencies. The signs and symptoms of potential                            delayed complications were discussed with the                            patient. Return to normal activities tomorrow.                            Written discharge instructions were provided to the                            patient.                           - Resume previous diet.                           - Continue present medications.                           - No aspirin, ibuprofen, naproxen, or other                            non-steroidal anti-inflammatory drugs for 2 weeks                            after polyp removal.                           -  Await pathology results.                           - See the other procedure note for documentation of                            additional recommendations.                           - MY OFFICE WILL SCHEDULE AN UPPER GI SERIES TO                            EVALUATE PARAESOPHAGEAL  HERNIA Gatha Mayer, MD 12/14/2019 11:19:36 AM This report has been signed electronically.

## 2019-12-14 NOTE — Progress Notes (Signed)
Vs VV 

## 2019-12-14 NOTE — Patient Instructions (Signed)
HANDOUTS PROVIDED ON: GASTRITIS, HERNIA, POLYPS, DIVERTICULOSIS, & HEMORRHOIDS  The polyps removed/biopsies taken today have been sent for pathology.  The results can take 1-3 weeks to receive.  When your next colonoscopy should occur will be based on the pathology results.    You may resume your previous diet and medication schedule.  Thank you for allowing [place] to care for you today!!!   YOU HAD AN ENDOSCOPIC PROCEDURE TODAY AT [place]:   Refer to the procedure report that was given to you for any specific questions about what was found during the examination.  If the procedure report does not answer your questions, please call your gastroenterologist to clarify.  If you requested that your care partner not be given the details of your procedure findings, then the procedure report has been included in a sealed envelope for you to review at your convenience later.  YOU SHOULD EXPECT: Some feelings of bloating in the abdomen. Passage of more gas than usual.  Walking can help get rid of the air that was put into your GI tract during the procedure and reduce the bloating. If you had a lower endoscopy (such as a colonoscopy or flexible sigmoidoscopy) you may notice spotting of blood in your stool or on the toilet paper. If you underwent a bowel prep for your procedure, you may not have a normal bowel movement for a few days.  Please Note:  You might notice some irritation and congestion in your nose or some drainage.  This is from the oxygen used during your procedure.  There is no need for concern and it should clear up in a day or so.  SYMPTOMS TO REPORT IMMEDIATELY:   Following lower endoscopy (colonoscopy or flexible sigmoidoscopy):  Excessive amounts of blood in the stool  Significant tenderness or worsening of abdominal pains  Swelling of the abdomen that is new, acute  Fever of 100F or higher   Following upper endoscopy (EGD)  Vomiting of blood or coffee ground  material  New chest pain or pain under the shoulder blades  Painful or persistently difficult swallowing  New shortness of breath  Fever of 100F or higher  Black, tarry-looking stools  For urgent or emergent issues, a gastroenterologist can be reached at any hour by calling [phone]. Do not use MyChart messaging for urgent concerns.    DIET:  We do recommend a small meal at first, but then you may proceed to your regular diet.  Drink plenty of fluids but you should avoid alcoholic beverages for 24 hours.  ACTIVITY:  You should plan to take it easy for the rest of today and you should NOT DRIVE or use heavy machinery until tomorrow (because of the sedation medicines used during the test).    FOLLOW UP: Our staff will call the number listed on your records 48-72 hours following your procedure to check on you and address any questions or concerns that you may have regarding the information given to you following your procedure. If we do not reach you, we will leave a message.  We will attempt to reach you two times.  During this call, we will ask if you have developed any symptoms of COVID 19. If you develop any symptoms (ie: fever, flu-like symptoms, shortness of breath, cough etc.) before then, please call [phone].  If you test positive for Covid 19 in the 2 weeks post procedure, please call and report this information to [place].    If any biopsies  were taken you will be contacted by phone or by letter within the next 1-3 weeks.  Please call us at [phone] if you have not heard about the biopsies in 3 weeks.    SIGNATURES/CONFIDENTIALITY: You and/or your care partner have signed paperwork which will be entered into your electronic medical record.  These signatures attest to the fact that that the information above on your After Visit Summary has been reviewed and is understood.  Full responsibility of the confidentiality of this discharge information lies with you and/or your  care-partner.

## 2019-12-14 NOTE — Op Note (Signed)
[facility] Patient Name: Donna Barron Procedure Date: 12/14/2019 9:51 AM MRN: [id number] Endoscopist: Gatha Mayer , MD Age: 79 Referring MD:  Date of Birth: 1941-05-02 Gender: Female Account #: [id number] Procedure:                Colonoscopy Indications:              Positive Cologuard test Medicines:                Propofol per Anesthesia, Monitored Anesthesia Care Procedure:                Pre-Anesthesia Assessment:                           - Prior to the procedure, a History and Physical                            was performed, and patient medications and                            allergies were reviewed. The patient's tolerance of                            previous anesthesia was also reviewed. The risks                            and benefits of the procedure and the sedation                            options and risks were discussed with the patient.                            All questions were answered, and informed consent                            was obtained. Prior Anticoagulants: The patient has                            taken no previous anticoagulant or antiplatelet                            agents. ASA Grade Assessment: III - A patient with                            severe systemic disease. After reviewing the risks                            and benefits, the patient was deemed in                            satisfactory condition to undergo the procedure.                           After obtaining informed consent, the colonoscope  was passed under direct vision. Throughout the                            procedure, the patient's blood pressure, pulse, and                            oxygen saturations were monitored continuously. The                            Colonoscope was introduced through the anus and                            advanced to the the cecum, identified by                            appendiceal orifice  and ileocecal valve. The                            colonoscopy was somewhat difficult due to                            significant looping. Successful completion of the                            procedure was aided by applying abdominal pressure.                            The patient tolerated the procedure well. The                            quality of the bowel preparation was excellent. The                            ileocecal valve, appendiceal orifice, and rectum                            were photographed. Scope In: 10:26:15 AM Scope Out: 11:01:39 AM Scope Withdrawal Time: 0 hours 8 minutes 54 seconds  Total Procedure Duration: 0 hours 35 minutes 24 seconds  Findings:                 Hemorrhoids were found on perianal exam.                           Three sessile polyps were found in the cecum. The                            polyps were 7 to 12 mm in size. These polyps were                            removed with a hot snare. Resection and retrieval                            were complete. Verification of patient  identification for the specimen was done. Estimated                            blood loss was minimal. Estimated blood loss: none.                           Six sessile polyps were found in the descending                            colon, transverse colon, ascending colon and cecum.                            The polyps were diminutive in size. These polyps                            were removed with a cold snare. Resection and                            retrieval were complete. Verification of patient                            identification for the specimen was done. Estimated                            blood loss was minimal.                           Many small and large-mouthed diverticula were found                            in the sigmoid colon.                           The exam was otherwise without abnormality on                             direct and retroflexion views. Complications:            No immediate complications. Estimated Blood Loss:     Estimated blood loss was minimal. Impression:               - Hemorrhoids found on perianal exam.                           - Three 7 to 12 mm polyps in the cecum, removed                            with a hot snare. Resected and retrieved.                           - Six diminutive polyps in the descending colon, in                            the transverse colon, in the ascending colon and in  the cecum, removed with a cold snare. Resected and                            retrieved.                           - Diverticulosis in the sigmoid colon.                           - The examination was otherwise normal on direct                            and retroflexion views. Recommendation:           - Patient has a contact number available for                            emergencies. The signs and symptoms of potential                            delayed complications were discussed with the                            patient. Return to normal activities tomorrow.                            Written discharge instructions were provided to the                            patient.                           - Resume previous diet.                           - Continue present medications.                           - No aspirin, ibuprofen, naproxen, or other                            non-steroidal anti-inflammatory drugs for 2 weeks                            after polyp removal.                           - No recommendation at this time regarding repeat                            colonoscopy due to age. Gatha Mayer, MD 12/14/2019 11:23:18 AM This report has been signed electronically.

## 2019-12-19 ENCOUNTER — Telehealth: Payer: Self-pay | Admitting: *Deleted

## 2019-12-19 ENCOUNTER — Telehealth: Payer: Self-pay

## 2019-12-19 NOTE — Telephone Encounter (Signed)
  Follow up Call-  Call back number 12/14/2019  Post procedure Call Back phone  # [phone]  Permission to leave phone message Yes  Some recent data might be hidden    First attempt for follow up phone call. No answer at number given.  Left message on voicemail.

## 2019-12-19 NOTE — Telephone Encounter (Signed)
Second post procedure follow up call, no answer 

## 2019-12-24 ENCOUNTER — Other Ambulatory Visit: Payer: Self-pay

## 2019-12-24 DIAGNOSIS — K449 Diaphragmatic hernia without obstruction or gangrene: Secondary | ICD-10-CM

## 2019-12-24 DIAGNOSIS — R1310 Dysphagia, unspecified: Secondary | ICD-10-CM

## 2020-01-01 ENCOUNTER — Encounter: Payer: Self-pay | Admitting: Internal Medicine

## 2020-01-01 ENCOUNTER — Telehealth: Payer: Self-pay | Admitting: Internal Medicine

## 2020-01-01 ENCOUNTER — Other Ambulatory Visit: Payer: Self-pay

## 2020-01-01 ENCOUNTER — Ambulatory Visit (HOSPITAL_COMMUNITY)
Admission: RE | Admit: 2020-01-01 | Discharge: 2020-01-01 | Disposition: A | Discharge status: Home / Self Care | Payer: Medicare Other | Source: Ambulatory Visit | Attending: Internal Medicine | Admitting: Internal Medicine

## 2020-01-01 DIAGNOSIS — R1310 Dysphagia, unspecified: Secondary | ICD-10-CM

## 2020-01-01 DIAGNOSIS — K449 Diaphragmatic hernia without obstruction or gangrene: Secondary | ICD-10-CM | POA: Insufficient documentation

## 2020-01-01 NOTE — Telephone Encounter (Signed)
Hey Dr Carlean Purl, I spoke with Olivia Mackie pt's daughter and informed her of your note. Caller is agreeable to seeing a surgeon and would like to know if a referral could be placed for the pt to see one.

## 2020-01-01 NOTE — Telephone Encounter (Signed)
Referral placed to [facility] via epic.

## 2020-01-01 NOTE — Telephone Encounter (Signed)
Please refer to Dr. Kaylyn Lim of [facility] re: large paraesophageal hiatal hernia

## 2020-02-22 ENCOUNTER — Ambulatory Visit: Payer: Medicare Other | Admitting: Internal Medicine

## 2020-03-14 NOTE — Patient Instructions (Addendum)
DUE TO COVID-19 ONLY ONE VISITOR IS ALLOWED TO COME WITH YOU AND STAY IN THE WAITING ROOM ONLY DURING PRE OP AND PROCEDURE DAY OF SURGERY. THE 1 VISITOR  MAY VISIT WITH YOU AFTER SURGERY IN YOUR PRIVATE ROOM DURING VISITING HOURS ONLY!  YOU NEED TO HAVE A COVID 19 TEST ON: 03/20/20 @ 10:30 AM , THIS TEST MUST BE DONE BEFORE SURGERY,  COVID TESTING SITE [place] [address], IT IS ON THE RIGHT GOING OUT [address] APPROXIMATELY  2 MINUTES PAST [employer] ON THE RIGHT. ONCE YOUR COVID TEST IS COMPLETED,  PLEASE BEGIN THE QUARANTINE INSTRUCTIONS AS OUTLINED IN YOUR HANDOUT.                Donna Barron   Your procedure is scheduled on: 03/24/20   Report to [facility] Main  Entrance   Report to admitting at: 11:30 AM     Call this number if you have problems the morning of surgery [phone]    Remember: Do not eat solid food :After Midnight. Clear liquids from midnight until: 10:30 am.  CLEAR LIQUID DIET   Foods Allowed                                                                     Foods Excluded  Coffee and tea, regular and decaf                             liquids that you cannot  Plain Jell-O any favor except red or purple                                           see through such as: Fruit ices (not with fruit pulp)                                     milk, soups, orange juice  Iced Popsicles                                    All solid food Carbonated beverages, regular and diet                                    Cranberry, grape and apple juices Sports drinks like Gatorade Lightly seasoned clear broth or consume(fat free) Sugar, honey syrup  Sample Menu Breakfast                                Lunch                                     Supper Cranberry juice  Beef broth                            Chicken bro Jell-O                                     Grape juice                           Apple juice Coffee  or tea                        Jell-O                                      Popsicle                                                Coffee or tea                        Coffee or tea  _____________________________________________________________________  BRUSH YOUR TEETH MORNING OF SURGERY AND RINSE YOUR MOUTH OUT, NO CHEWING GUM CANDY OR MINTS.     Take these medicines the morning of surgery with A SIP OF WATER: metoprolol,pantoprazole.Lorazepam as needed,flonase as usual.      You may not have any metal on your body including hair pins and              piercings  Do not wear jewelry, make-up, lotions, powders or perfumes, deodorant             Do not wear nail polish on your fingernails.  Do not shave  48 hours prior to surgery.                Do not bring valuables to the hospital. [place].  Contacts, dentures or bridgework may not be worn into surgery.  Leave suitcase in the car. After surgery it may be brought to your room.     Patients discharged the day of surgery will not be allowed to drive home. IF YOU ARE HAVING SURGERY AND GOING HOME THE SAME DAY, YOU MUST HAVE AN ADULT TO DRIVE YOU HOME AND BE WITH YOU FOR 24 HOURS. YOU MAY GO HOME BY TAXI OR UBER OR ORTHERWISE, BUT AN ADULT MUST ACCOMPANY YOU HOME AND STAY WITH YOU FOR 24 HOURS.  Name and phone number of your driver:  Special Instructions: N/A              Please read over the following fact sheets you were given: _____________________________________________________________________          [facility] - Preparing for Surgery Before surgery, you can play an important role.  Because skin is not sterile, your skin needs to be as free of germs as possible.  You can reduce the number of germs on your skin by washing with CHG (chlorahexidine gluconate) soap before surgery.  CHG is an antiseptic cleaner which kills germs and bonds with the  skin to continue killing germs even after  washing. Please DO NOT use if you have an allergy to CHG or antibacterial soaps.  If your skin becomes reddened/irritated stop using the CHG and inform your nurse when you arrive at Short Stay. Do not shave (including legs and underarms) for at least 48 hours prior to the first CHG shower.  You may shave your face/neck. Please follow these instructions carefully:  1.  Shower with CHG Soap the night before surgery and the  morning of Surgery.  2.  If you choose to wash your hair, wash your hair first as usual with your  normal  shampoo.  3.  After you shampoo, rinse your hair and body thoroughly to remove the  shampoo.                           4.  Use CHG as you would any other liquid soap.  You can apply chg directly  to the skin and wash                       Gently with a scrungie or clean washcloth.  5.  Apply the CHG Soap to your body ONLY FROM THE NECK DOWN.   Do not use on face/ open                           Wound or open sores. Avoid contact with eyes, ears mouth and genitals (private parts).                       Wash face,  Genitals (private parts) with your normal soap.             6.  Wash thoroughly, paying special attention to the area where your surgery  will be performed.  7.  Thoroughly rinse your body with warm water from the neck down.  8.  DO NOT shower/wash with your normal soap after using and rinsing off  the CHG Soap.                9.  Pat yourself dry with a clean towel.            10.  Wear clean pajamas.            11.  Place clean sheets on your bed the night of your first shower and do not  sleep with pets. Day of Surgery : Do not apply any lotions/deodorants the morning of surgery.  Please wear clean clothes to the hospital/surgery center.  FAILURE TO FOLLOW THESE INSTRUCTIONS MAY RESULT IN THE CANCELLATION OF YOUR SURGERY PATIENT SIGNATURE_________________________________  NURSE  SIGNATURE__________________________________  ________________________________________________________________________

## 2020-03-17 ENCOUNTER — Other Ambulatory Visit: Payer: Self-pay

## 2020-03-17 ENCOUNTER — Ambulatory Visit: Payer: Self-pay | Admitting: Surgery

## 2020-03-17 ENCOUNTER — Encounter (HOSPITAL_COMMUNITY): Payer: Self-pay

## 2020-03-17 ENCOUNTER — Encounter (HOSPITAL_COMMUNITY)
Admission: RE | Admit: 2020-03-17 | Discharge: 2020-03-17 | Disposition: A | Discharge status: Home / Self Care | Payer: Medicare Other | Source: Ambulatory Visit | Attending: Surgery | Admitting: Surgery

## 2020-03-17 DIAGNOSIS — I10 Essential (primary) hypertension: Secondary | ICD-10-CM | POA: Insufficient documentation

## 2020-03-17 DIAGNOSIS — K449 Diaphragmatic hernia without obstruction or gangrene: Secondary | ICD-10-CM | POA: Diagnosis not present

## 2020-03-17 DIAGNOSIS — Z01818 Encounter for other preprocedural examination: Secondary | ICD-10-CM | POA: Diagnosis present

## 2020-03-17 DIAGNOSIS — K219 Gastro-esophageal reflux disease without esophagitis: Secondary | ICD-10-CM | POA: Insufficient documentation

## 2020-03-17 DIAGNOSIS — Z79899 Other long term (current) drug therapy: Secondary | ICD-10-CM | POA: Insufficient documentation

## 2020-03-17 LAB — CBC
HCT: 29.2 % — ABNORMAL LOW (ref 36.0–46.0)
Hemoglobin: 7.6 g/dL — ABNORMAL LOW (ref 12.0–15.0)
MCH: 16.1 pg — ABNORMAL LOW (ref 26.0–34.0)
MCHC: 26 g/dL — ABNORMAL LOW (ref 30.0–36.0)
MCV: 61.9 fL — ABNORMAL LOW (ref 80.0–100.0)
Platelets: 586 10*3/uL — ABNORMAL HIGH (ref 150–400)
RBC: 4.72 MIL/uL (ref 3.87–5.11)
RDW: 21.3 % — ABNORMAL HIGH (ref 11.5–15.5)
WBC: 8.5 10*3/uL (ref 4.0–10.5)
nRBC: 0 % (ref 0.0–0.2)

## 2020-03-17 LAB — BASIC METABOLIC PANEL
Anion gap: 8 (ref 5–15)
BUN: 15 mg/dL (ref 8–23)
CO2: 27 mmol/L (ref 22–32)
Calcium: 9.1 mg/dL (ref 8.9–10.3)
Chloride: 104 mmol/L (ref 98–111)
Creatinine, Ser: 0.77 mg/dL (ref 0.44–1.00)
GFR calc Af Amer: >60 mL/min (ref >60)
GFR calc non Af Amer: >60 mL/min (ref >60)
Glucose, Bld: 104 mg/dL — ABNORMAL HIGH (ref 70–99)
Potassium: 3.8 mmol/L (ref 3.5–5.1)
Sodium: 139 mmol/L (ref 135–145)

## 2020-03-17 NOTE — Progress Notes (Signed)
Lab. Results: [facility]: 7.6. [facility]: 292.

## 2020-03-17 NOTE — Progress Notes (Signed)
COVID Vaccine Completed: Yes Date COVID Vaccine completed: 08/06/19 COVID vaccine manufacturer:     Moderna     PCP - Dr. Bradly Bienenstock. Cardiologist - NO  Chest x-ray -  EKG -  Stress Test -  ECHO -  Cardiac Cath -  Pacemaker/ICD device last checked: A1-C: 5.6: 03/03/20. CEW. Sleep Study -  CPAP -   Fasting Blood Sugar -  Checks Blood Sugar _____ times a day  Blood Thinner Instructions: Aspirin Instructions: Last Dose:  Anesthesia review: Hx: HTN,Dysrhythmia.Pt. is independent,she verbalized that she gets tired and short of breath when she walks like half of mile.  Patient denies shortness of breath, fever, cough and chest pain at PAT appointment   Patient verbalized understanding of instructions that were given to them at the PAT appointment. Patient was also instructed that they will need to review over the PAT instructions again at home before surgery.

## 2020-03-20 ENCOUNTER — Other Ambulatory Visit (HOSPITAL_COMMUNITY)
Admission: RE | Admit: 2020-03-20 | Discharge: 2020-03-20 | Disposition: A | Discharge status: Home / Self Care | Payer: Medicare Other | Source: Ambulatory Visit | Attending: Surgery | Admitting: Surgery

## 2020-03-20 DIAGNOSIS — Z01812 Encounter for preprocedural laboratory examination: Secondary | ICD-10-CM | POA: Diagnosis present

## 2020-03-20 DIAGNOSIS — Z20822 Contact with and (suspected) exposure to covid-19: Secondary | ICD-10-CM | POA: Diagnosis not present

## 2020-03-20 LAB — SARS CORONAVIRUS 2 (TAT 6-24 HRS): SARS Coronavirus 2: NEGATIVE

## 2020-03-21 NOTE — Progress Notes (Signed)
Anesthesia Chart Review   Case: [id number] Date/Time: 03/24/20 1315   Procedure: LAPAROSCOPIC REPAIR OF HIATAL HERNIA, nissen fundoplication (N/A )   Anesthesia type: General   Pre-op diagnosis: LARGE TYPE 3 HIATAL HERNIA   Location: [place] [facility] / [facility]   Surgeons: Johnathan Hausen, MD      DISCUSSION:79 y.o. never smoker with h/o PONV, GERD, HTN, large type 3 hiatal hernia scheduled for above procedure 03/24/2020 with Dr. Johnathan Hausen.   Hemoglobin 7.6 at PAT visit 03/17/2020.  Spoke to nurse at Dr. Earlie Server office. Dr. Hassell Done ordered type and screen.     VS: BP (!) 139/56   Pulse 75   Temp 37 C (Oral)   Ht 5\' 2"  (1.575 m)   Wt 89.8 kg   BMI 36.21 kg/m   PROVIDERS: Jettie Booze, NP is PCP    LABS: Labs reviewed: Acceptable for surgery. (all labs ordered are listed, but only abnormal results are displayed)  Labs Reviewed  BASIC METABOLIC PANEL - Abnormal; Notable for the following components:      Result Value   Glucose, Bld 104 (*)    All other components within normal limits  CBC - Abnormal; Notable for the following components:   Hemoglobin 7.6 (*)    HCT 29.2 (*)    MCV 61.9 (*)    MCH 16.1 (*)    MCHC 26.0 (*)    RDW 21.3 (*)    Platelets 586 (*)    All other components within normal limits     IMAGES:   EKG: 03/17/2020 Rate 66 bpm  NSR   CV:  Past Medical History:  Diagnosis Date  . Anxiety   . Cataract   . Colon polyps   . Depression   . Dysrhythmia    heart palipations  . Gallstones   . GERD (gastroesophageal reflux disease)   . HTN (hypertension)   . Hypercholesteremia   . PONV (postoperative nausea and vomiting)     Past Surgical History:  Procedure Laterality Date  . APPENDECTOMY    . ARTHROSCOPIC REPAIR ACL  05/2008   Left knee  . CARPAL TUNNEL RELEASE Right 07/14/2017   Procedure: CARPAL TUNNEL RELEASE;  Surgeon: Carole Civil, MD;  Location: [facility];  Service: Orthopedics;  Laterality: Right;  . CATARACT EXTRACTION,  BILATERAL  2020  . CHOLECYSTECTOMY    . DILATION AND CURETTAGE OF UTERUS    . DORSAL COMPARTMENT RELEASE Right 07/14/2017   Procedure: RELEASE DORSAL COMPARTMENT (DEQUERVAIN);  Surgeon: Carole Civil, MD;  Location: [facility];  Service: Orthopedics;  Laterality: Right;  . HYSTEROSCOPY WITH D & C N/A 12/25/2013   Procedure: DILATATION AND CURETTAGE /HYSTEROSCOPY;  Surgeon: Gus Height, MD;  Location: [facility];  Service: Gynecology;  Laterality: N/A;  . SHOULDER OPEN ROTATOR CUFF REPAIR Left 06/15/2018   Procedure: ROTATOR CUFF REPAIR SHOULDER OPEN WITH ACROMIOPLASTY;  Surgeon: Carole Civil, MD;  Location: [facility];  Service: Orthopedics;  Laterality: Left;  . TUBAL LIGATION      MEDICATIONS: . escitalopram (LEXAPRO) 20 MG tablet  . fluticasone (FLONASE) 50 MCG/ACT nasal spray  . LORazepam (ATIVAN) 0.5 MG tablet  . metoprolol succinate (TOPROL-XL) 50 MG 24 hr tablet  . nystatin (MYCOSTATIN) 100000 UNIT/ML suspension  . pantoprazole (PROTONIX) 40 MG tablet  . polyethylene glycol powder (GLYCOLAX/MIRALAX) 17 GM/SCOOP powder  . simvastatin (ZOCOR) 40 MG tablet  . triamterene-hydrochlorothiazide (MAXZIDE) 75-50 MG per tablet   No current facility-administered medications for this encounter.   Marland Kitchen  fentaNYL (SUBLIMAZE) injection 25-50 mcg     Maia Plan [facility] Pre-Surgical Testing [phone]

## 2020-03-21 NOTE — Anesthesia Preprocedure Evaluation (Addendum)
Anesthesia Evaluation  Patient identified by MRN, date of birth, ID band Patient awake    Reviewed: Allergy & Precautions, NPO status , Patient's Chart, lab work & pertinent test results  History of Anesthesia Complications (+) PONV and history of anesthetic complications  Airway Mallampati: II  TM Distance: <3 FB Neck ROM: Limited    Dental  (+) Upper Dentures, Partial Lower, Dental Advisory Given   Pulmonary neg pulmonary ROS,    breath sounds clear to auscultation       Cardiovascular hypertension, Pt. on home beta blockers and Pt. on medications + dysrhythmias  Rhythm:Regular Rate:Normal     Neuro/Psych PSYCHIATRIC DISORDERS Anxiety Depression  Neuromuscular disease    GI/Hepatic Neg liver ROS, GERD  Medicated,  Endo/Other  negative endocrine ROS  Renal/GU negative Renal ROS     Musculoskeletal negative musculoskeletal ROS (+)   Abdominal (+) + obese,   Peds  Hematology negative hematology ROS (+)   Anesthesia Other Findings   Reproductive/Obstetrics                            Anesthesia Physical Anesthesia Plan  ASA: II  Anesthesia Plan: General   Post-op Pain Management:    Induction: Intravenous  PONV Risk Score and Plan: 4 or greater and Ondansetron, Dexamethasone and Treatment may vary due to age or medical condition  Airway Management Planned: Oral ETT  Additional Equipment: None  Intra-op Plan:   Post-operative Plan: Extubation in OR  Informed Consent: I have reviewed the patients History and Physical, chart, labs and discussed the procedure including the risks, benefits and alternatives for the proposed anesthesia with the patient or authorized representative who has indicated his/her understanding and acceptance.     Dental advisory given  Plan Discussed with: CRNA  Anesthesia Plan Comments: (See PAT note 03/17/2020, Konrad Felix, PA-C)       Anesthesia Quick Evaluation

## 2020-03-22 MED ORDER — BUPIVACAINE LIPOSOME 1.3 % IJ SUSP
20.0000 mL | INTRAMUSCULAR | Status: DC
  Filled 2020-03-22: qty 20

## 2020-03-23 NOTE — H&P (Signed)
Donna Barron Location: [place] Office Patient #: [id number] DOB: [birth year] Widowed / Language: English / Race: White Female  CC dysphagia  History of Present Illness   The patient is a 79 year old female who presents with a hiatal hernia.  with dysphagia and sometimes spitting up. She had an upper GI which I reviewed and this showed most of her stomach in her chest except for the antrum and duodenum and with some esophageal dysmotility. Initially she was  not convinced about her symptoms and I told her that she and her daughter can talk about this. They did and she decided to have surgery.  I described the surgery to her including its limitations not limited to failure and  reherniation in some detail. . Her prior abdominal surgery includes a transabdominal incision for open gallbladder done many years ago.   Past Surgical History  Appendectomy  Cataract Surgery  Bilateral. Colon Polyp Removal - Colonoscopy  Gallbladder Surgery - Open  Knee Surgery  Left. Shoulder Surgery  Left.  Diagnostic Studies History  Colonoscopy  within last year Mammogram  >3 years ago Pap Smear  >5 years ago  Allergies  HYDROcodone-Acetaminophen *ANALGESICS - OPIOID*  Avelox *FLUOROQUINOLONES*  Codeine Sulfate *ANALGESICS - OPIOID*  Oxybutynin *URINARY ANTISPASMODICS*   Medication  Aspirin (81MG  Tablet, Oral) Active. Escitalopram Oxalate (20MG  Tablet, Oral) Active. Fluticasone Propionate (50MCG/ACT Suspension, Nasal) Active. Ibuprofen (800MG  Tablet, Oral) Active. LORazepam (0.5MG  Tablet, Oral) Active. Metoprolol Succinate ER (50MG  Tablet ER 24HR, Oral) Active. Pantoprazole Sodium (40MG  Tablet DR, Oral) Active. MiraLax (17GM/SCOOP Powder, Oral) Active. K-Dur (10MEQ Tablet ER, Oral) Active. Simvastatin (40MG  Tablet, Oral) Active. Maxzide (75-50MG  Tablet, Oral) Active. Medications Reconciled  Social History Malachi Bonds, CMA; 01/17/2020 10:39  AM) Caffeine use  Carbonated beverages, Coffee. No alcohol use  No drug use  Tobacco use  Never smoker.  Family History Malachi Bonds, CMA; 01/17/2020 10:39 AM) Alcohol Abuse  Brother, Father. Arthritis  Mother. Breast Cancer  Sister. Colon Cancer  Brother. Colon Polyps  Brother, Daughter, Sister, Son. Depression  Daughter. Diabetes Mellitus  Daughter, Sister. Heart Disease  Mother, Sister. Hypertension  Sister. Ovarian Cancer  Sister.  Pregnancy / Birth History Malachi Bonds, CMA; 01/17/2020 10:39 AM) Age of menopause  11-55 Gravida  4 Maternal age  34-25 Para  4  Other Problems Malachi Bonds, CMA; 01/17/2020 10:39 AM) Anxiety Disorder  Arthritis  Bladder Problems  Cholelithiasis  Depression  Gastroesophageal Reflux Disease  Hemorrhoids  Hypercholesterolemia  Inguinal Hernia     Review of Systems (Chemira Jones CMA; 01/17/2020 10:39 AM) General Present- Chills, Fatigue and Night Sweats. Not Present- Appetite Loss, Fever, Weight Gain and Weight Loss. Skin Present- Dryness. Not Present- Change in Wart/Mole, Hives, Jaundice, New Lesions, Non-Healing Wounds, Rash and Ulcer. Respiratory Present- Snoring. Not Present- Bloody sputum, Chronic Cough, Difficulty Breathing and Wheezing. Cardiovascular Present- Leg Cramps, Palpitations and Swelling of Extremities. Not Present- Chest Pain, Difficulty Breathing Lying Down, Rapid Heart Rate and Shortness of Breath. Gastrointestinal Present- Abdominal Pain, Bloating, Constipation, Difficulty Swallowing, Gets full quickly at meals, Hemorrhoids, Indigestion, Nausea and Vomiting. Not Present- Bloody Stool, Change in Bowel Habits, Chronic diarrhea, Excessive gas and Rectal Pain. Female Genitourinary Present- Frequency, Nocturia and Urgency. Not Present- Painful Urination and Pelvic Pain. Musculoskeletal Present- Joint Pain and Muscle Pain. Not Present- Back Pain, Joint Stiffness, Muscle  Weakness and Swelling of Extremities. Neurological Present- Tingling and Weakness. Not Present- Decreased Memory, Fainting, Headaches, Numbness, Seizures, Tremor and Trouble walking. Psychiatric Present- Anxiety and Depression. Not Present-  Bipolar, Change in Sleep Pattern, Fearful and Frequent crying. Endocrine Not Present- Cold Intolerance, Excessive Hunger, Hair Changes, Heat Intolerance, Hot flashes and New Diabetes. Hematology Not Present- Blood Thinners, Easy Bruising, Excessive bleeding, Gland problems, HIV and Persistent Infections.  Vitals   Weight: 204.8 lb Height: 62in Body Surface Area: 1.93 m Body Mass Index: 37.46 kg/m  Pulse: 84 (Regular)  BP: 118/72(Sitting, Left Arm, Standard)  Physical Exam  General Note obese white female HEENT Chest Heart Pulmonary no increased work of breathing with walking around the office. Abdomen Abdomen she has a transverse incisional way across her abdomen from the gallbladder done in the pre-laparoscopic care. Extremity adequate range of motion with mobility.  EXT   Assessment & Plan Rodman Key B. Hassell Done MD; 01/17/2020 11:26 AM) HIATAL HERNIA (K44.9) Impression: large type III hiatal hernia containing most of her stomach with the antrum and duodenum below the diaphragm. She doesn't appear to be obstructed the present time. She is 5 feet 2 inches tall weighs 204. I think she can have laparoscopic takedown  with pexy or with fundoplication.   Her anemia is thought to be from her hiatal hernia with chronic blood loss.     Plan:  Laparoscopic repair of hiatal hernia with possible antireflux procedure.     Matt B. Hassell Done, MD, FACS

## 2020-03-24 ENCOUNTER — Ambulatory Visit (HOSPITAL_COMMUNITY): Payer: Medicare Other | Admitting: Physician Assistant

## 2020-03-24 ENCOUNTER — Ambulatory Visit (HOSPITAL_COMMUNITY): Payer: Medicare Other | Admitting: Anesthesiology

## 2020-03-24 ENCOUNTER — Encounter (HOSPITAL_COMMUNITY): Payer: Self-pay | Admitting: Surgery

## 2020-03-24 ENCOUNTER — Encounter (HOSPITAL_COMMUNITY)
Admission: AD | Disposition: A | Discharge status: Home / Self Care | Payer: Self-pay | Source: Ambulatory Visit | Attending: Surgery

## 2020-03-24 ENCOUNTER — Inpatient Hospital Stay (HOSPITAL_COMMUNITY)
Admission: AD | Admit: 2020-03-24 | Discharge: 2020-03-26 | DRG: 328 | Disposition: A | Discharge status: Home / Self Care | Payer: Medicare Other | Source: Ambulatory Visit | Attending: Surgery | Admitting: Surgery

## 2020-03-24 DIAGNOSIS — E782 Mixed hyperlipidemia: Secondary | ICD-10-CM | POA: Diagnosis present

## 2020-03-24 DIAGNOSIS — Z8261 Family history of arthritis: Secondary | ICD-10-CM

## 2020-03-24 DIAGNOSIS — Z8249 Family history of ischemic heart disease and other diseases of the circulatory system: Secondary | ICD-10-CM

## 2020-03-24 DIAGNOSIS — Z8371 Family history of colonic polyps: Secondary | ICD-10-CM

## 2020-03-24 DIAGNOSIS — Z833 Family history of diabetes mellitus: Secondary | ICD-10-CM

## 2020-03-24 DIAGNOSIS — Z9889 Other specified postprocedural states: Secondary | ICD-10-CM

## 2020-03-24 DIAGNOSIS — Z8719 Personal history of other diseases of the digestive system: Secondary | ICD-10-CM

## 2020-03-24 DIAGNOSIS — K449 Diaphragmatic hernia without obstruction or gangrene: Principal | ICD-10-CM | POA: Diagnosis present

## 2020-03-24 DIAGNOSIS — D500 Iron deficiency anemia secondary to blood loss (chronic): Secondary | ICD-10-CM | POA: Diagnosis present

## 2020-03-24 DIAGNOSIS — Z6836 Body mass index (BMI) 36.0-36.9, adult: Secondary | ICD-10-CM

## 2020-03-24 DIAGNOSIS — K224 Dyskinesia of esophagus: Secondary | ICD-10-CM | POA: Diagnosis present

## 2020-03-24 HISTORY — PX: HIATAL HERNIA REPAIR: SHX195

## 2020-03-24 LAB — CBC
HCT: 32.5 % — ABNORMAL LOW (ref 36.0–46.0)
Hemoglobin: 8.6 g/dL — ABNORMAL LOW (ref 12.0–15.0)
MCH: 17.2 pg — ABNORMAL LOW (ref 26.0–34.0)
MCHC: 26.5 g/dL — ABNORMAL LOW (ref 30.0–36.0)
MCV: 65 fL — ABNORMAL LOW (ref 80.0–100.0)
Platelets: 508 10*3/uL — ABNORMAL HIGH (ref 150–400)
RBC: 5 MIL/uL (ref 3.87–5.11)
RDW: 23.8 % — ABNORMAL HIGH (ref 11.5–15.5)
WBC: 18.3 10*3/uL — ABNORMAL HIGH (ref 4.0–10.5)
nRBC: 0 % (ref 0.0–0.2)

## 2020-03-24 LAB — CBC WITH DIFFERENTIAL/PLATELET
Abs Immature Granulocytes: 0.02 10*3/uL (ref 0.00–0.07)
Basophils Absolute: 0.1 10*3/uL (ref 0.0–0.1)
Basophils Relative: 1 %
Eosinophils Absolute: 0.1 10*3/uL (ref 0.0–0.5)
Eosinophils Relative: 1 %
HCT: 30.1 % — ABNORMAL LOW (ref 36.0–46.0)
Hemoglobin: 7.7 g/dL — ABNORMAL LOW (ref 12.0–15.0)
Immature Granulocytes: 0 %
Lymphocytes Relative: 22 %
Lymphs Abs: 1.8 10*3/uL (ref 0.7–4.0)
MCH: 15.8 pg — ABNORMAL LOW (ref 26.0–34.0)
MCHC: 25.6 g/dL — ABNORMAL LOW (ref 30.0–36.0)
MCV: 61.9 fL — ABNORMAL LOW (ref 80.0–100.0)
Monocytes Absolute: 0.6 10*3/uL (ref 0.1–1.0)
Monocytes Relative: 8 %
Neutro Abs: 5.5 10*3/uL (ref 1.7–7.7)
Neutrophils Relative %: 68 %
Platelets: 541 10*3/uL — ABNORMAL HIGH (ref 150–400)
RBC: 4.86 MIL/uL (ref 3.87–5.11)
RDW: 21.2 % — ABNORMAL HIGH (ref 11.5–15.5)
WBC: 8.1 10*3/uL (ref 4.0–10.5)
nRBC: 0 % (ref 0.0–0.2)

## 2020-03-24 LAB — HEMOGLOBIN AND HEMATOCRIT, BLOOD
HCT: 32 % — ABNORMAL LOW (ref 36.0–46.0)
Hemoglobin: 8.5 g/dL — ABNORMAL LOW (ref 12.0–15.0)

## 2020-03-24 LAB — CREATININE, SERUM
Creatinine, Ser: 0.91 mg/dL (ref 0.44–1.00)
GFR, Estimated: >60 mL/min (ref >60)

## 2020-03-24 LAB — ABO/RH: ABO/RH(D): A POS

## 2020-03-24 SURGERY — REPAIR, HERNIA, HIATAL, LAPAROSCOPIC
Anesthesia: General | Site: Abdomen

## 2020-03-24 MED ORDER — 0.9 % SODIUM CHLORIDE (POUR BTL) OPTIME
TOPICAL | Status: DC | PRN
  Administered 2020-03-24: 1000 mL

## 2020-03-24 MED ORDER — ONDANSETRON HCL 4 MG/2ML IJ SOLN
INTRAMUSCULAR | Status: DC | PRN
  Administered 2020-03-24: 4 mg via INTRAVENOUS

## 2020-03-24 MED ORDER — METOPROLOL TARTRATE 5 MG/5ML IV SOLN: 5.0000 mg | Freq: Four times a day (QID) | INTRAVENOUS | Status: DC | PRN

## 2020-03-24 MED ORDER — SODIUM CHLORIDE 0.9 % IV SOLN
INTRAVENOUS | Status: AC
  Filled 2020-03-24: qty 2

## 2020-03-24 MED ORDER — SCOPOLAMINE 1 MG/3DAYS TD PT72: 1.0000 | MEDICATED_PATCH | TRANSDERMAL | Status: DC

## 2020-03-24 MED ORDER — SODIUM CHLORIDE (PF) 0.9 % IJ SOLN
INTRAMUSCULAR | Status: DC | PRN
  Administered 2020-03-24: 10 mL

## 2020-03-24 MED ORDER — MEPERIDINE HCL 50 MG/ML IJ SOLN: 6.2500 mg | INTRAMUSCULAR | Status: DC | PRN

## 2020-03-24 MED ORDER — DEXAMETHASONE SODIUM PHOSPHATE 10 MG/ML IJ SOLN
INTRAMUSCULAR | Status: DC | PRN
  Administered 2020-03-24: 5 mg via INTRAVENOUS

## 2020-03-24 MED ORDER — FENTANYL CITRATE (PF) 100 MCG/2ML IJ SOLN
12.5000 ug | INTRAMUSCULAR | Status: DC | PRN
  Administered 2020-03-25: 12.5 ug via INTRAVENOUS
  Filled 2020-03-24: qty 2

## 2020-03-24 MED ORDER — HEPARIN SODIUM (PORCINE) 5000 UNIT/ML IJ SOLN
INTRAMUSCULAR | Status: AC
  Administered 2020-03-24: 5000 [IU] via SUBCUTANEOUS
  Filled 2020-03-24: qty 1

## 2020-03-24 MED ORDER — SUCCINYLCHOLINE CHLORIDE 200 MG/10ML IV SOSY
PREFILLED_SYRINGE | INTRAVENOUS | Status: DC | PRN
  Administered 2020-03-24: 140 mg via INTRAVENOUS

## 2020-03-24 MED ORDER — MIDAZOLAM HCL 2 MG/2ML IJ SOLN
INTRAMUSCULAR | Status: AC
  Filled 2020-03-24: qty 2

## 2020-03-24 MED ORDER — PROPOFOL 10 MG/ML IV BOLUS
INTRAVENOUS | Status: AC
  Filled 2020-03-24: qty 20

## 2020-03-24 MED ORDER — ORAL CARE MOUTH RINSE: 15.0000 mL | Freq: Once | OROMUCOSAL | Status: AC

## 2020-03-24 MED ORDER — ACETAMINOPHEN 500 MG PO TABS: 1000.0000 mg | ORAL_TABLET | ORAL | Status: AC

## 2020-03-24 MED ORDER — FENTANYL CITRATE (PF) 100 MCG/2ML IJ SOLN
INTRAMUSCULAR | Status: AC
  Filled 2020-03-24: qty 2

## 2020-03-24 MED ORDER — SODIUM CHLORIDE 0.9 % IV SOLN
2.0000 g | Freq: Two times a day (BID) | INTRAVENOUS | Status: AC
  Administered 2020-03-25: 2 g via INTRAVENOUS
  Filled 2020-03-24: qty 2

## 2020-03-24 MED ORDER — PNEUMOCOCCAL VAC POLYVALENT 25 MCG/0.5ML IJ INJ
0.5000 mL | INJECTION | INTRAMUSCULAR | Status: DC
  Filled 2020-03-24: qty 0.5

## 2020-03-24 MED ORDER — KCL IN DEXTROSE-NACL 20-5-0.45 MEQ/L-%-% IV SOLN
INTRAVENOUS | Status: DC
  Filled 2020-03-24 (×3): qty 1000

## 2020-03-24 MED ORDER — LACTATED RINGERS IV SOLN: INTRAVENOUS | Status: DC

## 2020-03-24 MED ORDER — SODIUM CHLORIDE 0.9 % IV SOLN
2.0000 g | INTRAVENOUS | Status: AC
  Administered 2020-03-24: 2 g via INTRAVENOUS

## 2020-03-24 MED ORDER — LACTATED RINGERS IV SOLN: INTRAVENOUS | Status: DC | PRN

## 2020-03-24 MED ORDER — ONDANSETRON HCL 4 MG/2ML IJ SOLN
INTRAMUSCULAR | Status: AC
  Filled 2020-03-24: qty 2

## 2020-03-24 MED ORDER — CHLORHEXIDINE GLUCONATE CLOTH 2 % EX PADS: 6.0000 | MEDICATED_PAD | Freq: Once | CUTANEOUS | Status: DC

## 2020-03-24 MED ORDER — PHENYLEPHRINE 40 MCG/ML (10ML) SYRINGE FOR IV PUSH (FOR BLOOD PRESSURE SUPPORT)
PREFILLED_SYRINGE | INTRAVENOUS | Status: DC | PRN
  Administered 2020-03-24: 120 ug via INTRAVENOUS
  Administered 2020-03-24: 80 ug via INTRAVENOUS
  Administered 2020-03-24: 120 ug via INTRAVENOUS
  Administered 2020-03-24: 80 ug via INTRAVENOUS

## 2020-03-24 MED ORDER — SCOPOLAMINE 1 MG/3DAYS TD PT72
MEDICATED_PATCH | TRANSDERMAL | Status: AC
  Administered 2020-03-24: 1.5 mg via TRANSDERMAL
  Filled 2020-03-24: qty 1

## 2020-03-24 MED ORDER — ACETAMINOPHEN 500 MG PO TABS
ORAL_TABLET | ORAL | Status: AC
  Administered 2020-03-24: 1000 mg via ORAL
  Filled 2020-03-24: qty 2

## 2020-03-24 MED ORDER — HEPARIN SODIUM (PORCINE) 5000 UNIT/ML IJ SOLN
5000.0000 [IU] | Freq: Three times a day (TID) | INTRAMUSCULAR | Status: DC
  Administered 2020-03-24 – 2020-03-26 (×5): 5000 [IU] via SUBCUTANEOUS
  Filled 2020-03-24 (×5): qty 1

## 2020-03-24 MED ORDER — DEXAMETHASONE SODIUM PHOSPHATE 10 MG/ML IJ SOLN
INTRAMUSCULAR | Status: AC
  Filled 2020-03-24: qty 1

## 2020-03-24 MED ORDER — ROCURONIUM BROMIDE 10 MG/ML (PF) SYRINGE
PREFILLED_SYRINGE | INTRAVENOUS | Status: DC | PRN
  Administered 2020-03-24: 10 mg via INTRAVENOUS
  Administered 2020-03-24: 50 mg via INTRAVENOUS

## 2020-03-24 MED ORDER — SODIUM CHLORIDE 0.9 % IV SOLN: INTRAVENOUS | Status: DC | PRN

## 2020-03-24 MED ORDER — LIDOCAINE 2% (20 MG/ML) 5 ML SYRINGE
INTRAMUSCULAR | Status: DC | PRN
  Administered 2020-03-24: 40 mg via INTRAVENOUS

## 2020-03-24 MED ORDER — FLUTICASONE PROPIONATE 50 MCG/ACT NA SUSP
1.0000 | Freq: Every day | NASAL | Status: DC | PRN
  Filled 2020-03-24: qty 16

## 2020-03-24 MED ORDER — EPHEDRINE SULFATE-NACL 50-0.9 MG/10ML-% IV SOSY
PREFILLED_SYRINGE | INTRAVENOUS | Status: DC | PRN
  Administered 2020-03-24: 10 mg via INTRAVENOUS
  Administered 2020-03-24: 5 mg via INTRAVENOUS

## 2020-03-24 MED ORDER — MIDAZOLAM HCL 5 MG/5ML IJ SOLN
INTRAMUSCULAR | Status: DC | PRN
  Administered 2020-03-24: .5 mg via INTRAVENOUS

## 2020-03-24 MED ORDER — ONDANSETRON HCL 4 MG/2ML IJ SOLN: 4.0000 mg | Freq: Four times a day (QID) | INTRAMUSCULAR | Status: DC | PRN

## 2020-03-24 MED ORDER — ONDANSETRON HCL 4 MG/2ML IJ SOLN: 4.0000 mg | Freq: Once | INTRAMUSCULAR | Status: DC | PRN

## 2020-03-24 MED ORDER — SODIUM CHLORIDE (PF) 0.9 % IJ SOLN
INTRAMUSCULAR | Status: AC
  Filled 2020-03-24: qty 10

## 2020-03-24 MED ORDER — LACTATED RINGERS IR SOLN
Status: DC | PRN
  Administered 2020-03-24: 1000 mL

## 2020-03-24 MED ORDER — LIDOCAINE 2% (20 MG/ML) 5 ML SYRINGE
INTRAMUSCULAR | Status: AC
  Filled 2020-03-24: qty 5

## 2020-03-24 MED ORDER — PANTOPRAZOLE SODIUM 40 MG IV SOLR
40.0000 mg | Freq: Every day | INTRAVENOUS | Status: DC
  Administered 2020-03-24 – 2020-03-25 (×2): 40 mg via INTRAVENOUS
  Filled 2020-03-24 (×2): qty 40

## 2020-03-24 MED ORDER — PROPOFOL 10 MG/ML IV BOLUS
INTRAVENOUS | Status: DC | PRN
  Administered 2020-03-24: 140 mg via INTRAVENOUS

## 2020-03-24 MED ORDER — CHLORHEXIDINE GLUCONATE 0.12 % MT SOLN
15.0000 mL | Freq: Once | OROMUCOSAL | Status: AC
  Administered 2020-03-24: 15 mL via OROMUCOSAL

## 2020-03-24 MED ORDER — FENTANYL CITRATE (PF) 100 MCG/2ML IJ SOLN
INTRAMUSCULAR | Status: DC | PRN
  Administered 2020-03-24 (×2): 50 ug via INTRAVENOUS
  Administered 2020-03-24 (×2): 25 ug via INTRAVENOUS

## 2020-03-24 MED ORDER — PHENYLEPHRINE HCL-NACL 10-0.9 MG/250ML-% IV SOLN
INTRAVENOUS | Status: DC | PRN
  Administered 2020-03-24: 50 ug/min via INTRAVENOUS

## 2020-03-24 MED ORDER — ONDANSETRON 4 MG PO TBDP
4.0000 mg | ORAL_TABLET | Freq: Four times a day (QID) | ORAL | Status: DC | PRN
  Administered 2020-03-26: 4 mg via ORAL
  Filled 2020-03-24: qty 1

## 2020-03-24 MED ORDER — ROCURONIUM BROMIDE 10 MG/ML (PF) SYRINGE
PREFILLED_SYRINGE | INTRAVENOUS | Status: AC
  Filled 2020-03-24: qty 10

## 2020-03-24 MED ORDER — HYDROMORPHONE HCL 1 MG/ML IJ SOLN: 0.2500 mg | INTRAMUSCULAR | Status: DC | PRN

## 2020-03-24 MED ORDER — HEPARIN SODIUM (PORCINE) 5000 UNIT/ML IJ SOLN: 5000.0000 [IU] | Freq: Once | INTRAMUSCULAR | Status: AC

## 2020-03-24 MED ORDER — BUPIVACAINE LIPOSOME 1.3 % IJ SUSP
INTRAMUSCULAR | Status: DC | PRN
  Administered 2020-03-24: 20 mL

## 2020-03-24 MED ORDER — METOPROLOL SUCCINATE ER 50 MG PO TB24
50.0000 mg | ORAL_TABLET | Freq: Every day | ORAL | Status: DC
  Administered 2020-03-25 – 2020-03-26 (×2): 50 mg via ORAL
  Filled 2020-03-24 (×2): qty 1

## 2020-03-24 SURGICAL SUPPLY — 47 items
APPLIER CLIP ROT 10 11.4 M/L (STAPLE)
CABLE HIGH FREQUENCY MONO STRZ (ELECTRODE) IMPLANT
CLAMP ENDO BABCK 10MM (STAPLE) IMPLANT
CLIP APPLIE ROT 10 11.4 M/L (STAPLE) IMPLANT
COVER SURGICAL LIGHT HANDLE (MISCELLANEOUS) ×2 IMPLANT
COVER WAND RF STERILE (DRAPES) IMPLANT
DECANTER SPIKE VIAL GLASS SM (MISCELLANEOUS) ×2 IMPLANT
DERMABOND ADVANCED (GAUZE/BANDAGES/DRESSINGS) ×1
DERMABOND ADVANCED .7 DNX12 (GAUZE/BANDAGES/DRESSINGS) ×1 IMPLANT
DEVICE SUT QUICK LOAD TK 5 (STAPLE) ×14 IMPLANT
DEVICE SUT TI-KNOT TK 5X26 (MISCELLANEOUS) ×2 IMPLANT
DEVICE SUTURE ENDOST 10MM (ENDOMECHANICALS) ×2 IMPLANT
DISSECTOR BLUNT TIP ENDO 5MM (MISCELLANEOUS) ×2 IMPLANT
DRAIN PENROSE 0.5X18 (DRAIN) ×2 IMPLANT
ELECT L-HOOK LAP 45CM DISP (ELECTROSURGICAL) ×2
ELECT REM PT RETURN 15FT ADLT (MISCELLANEOUS) ×2 IMPLANT
ELECTRODE L-HOOK LAP 45CM DISP (ELECTROSURGICAL) ×1 IMPLANT
GLOVE BIOGEL M 8.0 STRL (GLOVE) ×2 IMPLANT
GOWN STRL REUS W/TWL XL LVL3 (GOWN DISPOSABLE) ×6 IMPLANT
GRASPER ENDO BABCOCK 10 (MISCELLANEOUS) IMPLANT
GRASPER ENDO BABCOCK 10MM (MISCELLANEOUS)
KIT BASIN OR (CUSTOM PROCEDURE TRAY) ×2 IMPLANT
KIT TURNOVER KIT A (KITS) ×2 IMPLANT
PAD POSITIONING PINK XL (MISCELLANEOUS) ×2 IMPLANT
PENCIL SMOKE EVACUATOR (MISCELLANEOUS) IMPLANT
PROTECTOR NERVE ULNAR (MISCELLANEOUS) ×2 IMPLANT
SCISSORS LAP 5X45 EPIX DISP (ENDOMECHANICALS) ×2 IMPLANT
SET IRRIG TUBING LAPAROSCOPIC (IRRIGATION / IRRIGATOR) ×2 IMPLANT
SET TUBE SMOKE EVAC HIGH FLOW (TUBING) ×2 IMPLANT
SHEARS HARMONIC ACE PLUS 45CM (MISCELLANEOUS) ×2 IMPLANT
SLEEVE ADV FIXATION 5X100MM (TROCAR) ×4 IMPLANT
STAPLER VISISTAT 35W (STAPLE) IMPLANT
SUT MNCRL AB 4-0 PS2 18 (SUTURE) IMPLANT
SUT SURGIDAC NAB ES-9 0 48 120 (SUTURE) ×14 IMPLANT
SUT VIC AB 4-0 SH 18 (SUTURE) IMPLANT
TAPE CLOTH 4X10 WHT NS (GAUZE/BANDAGES/DRESSINGS) IMPLANT
TIP INNERVISION DETACH 40FR (MISCELLANEOUS) IMPLANT
TIP INNERVISION DETACH 50FR (MISCELLANEOUS) IMPLANT
TIP INNERVISION DETACH 56FR (MISCELLANEOUS) ×2 IMPLANT
TIPS INNERVISION DETACH 40FR (MISCELLANEOUS)
TOWEL OR 17X26 10 PK STRL BLUE (TOWEL DISPOSABLE) ×2 IMPLANT
TOWEL OR NON WOVEN STRL DISP B (DISPOSABLE) ×2 IMPLANT
TRAY FOLEY MTR SLVR 16FR STAT (SET/KITS/TRAYS/PACK) IMPLANT
TRAY LAPAROSCOPIC (CUSTOM PROCEDURE TRAY) ×2 IMPLANT
TROCAR ADV FIXATION 11X100MM (TROCAR) ×2 IMPLANT
TROCAR ADV FIXATION 5X100MM (TROCAR) ×2 IMPLANT
TROCAR BLADELESS OPT 5 100 (ENDOMECHANICALS) ×2 IMPLANT

## 2020-03-24 NOTE — Interval H&P Note (Signed)
History and Physical Interval Note:  03/24/2020 1:15 PM  Donna Barron  has presented today for surgery, with the diagnosis of LARGE TYPE 3 HIATAL HERNIA.  The various methods of treatment have been discussed with the patient and family. After consideration of risks, benefits and other options for treatment, the patient has consented to  Procedure(s): LAPAROSCOPIC REPAIR OF HIATAL HERNIA, nissen fundoplication (N/A) as a surgical intervention.  The patient's history has been reviewed, patient examined, no change in status, stable for surgery.  I have reviewed the patient's chart and labs.  Questions were answered to the patient's satisfaction.     Pedro Earls

## 2020-03-24 NOTE — Anesthesia Postprocedure Evaluation (Signed)
Anesthesia Post Note  Patient: Donna Barron  Procedure(s) Performed: LAPAROSCOPIC REPAIR OF HIATAL HERNIA, nissen fundoplication (N/A Abdomen)     Patient location during evaluation: PACU Anesthesia Type: General Level of consciousness: awake and alert Pain management: pain level controlled Vital Signs Assessment: post-procedure vital signs reviewed and stable Respiratory status: spontaneous breathing, nonlabored ventilation, respiratory function stable and patient connected to nasal cannula oxygen Cardiovascular status: blood pressure returned to baseline and stable Postop Assessment: no apparent nausea or vomiting Anesthetic complications: no   No complications documented.  Last Vitals:  Vitals:   03/24/20 1645 03/24/20 1700  BP: (!) 143/61 (!) 128/53  Pulse: 87 83  Resp: 15 14  Temp:    SpO2: 94% 93%    Last Pain:  Vitals:   03/24/20 1700  PainSc: 0-No pain                 Dustine Bertini DAVID

## 2020-03-24 NOTE — Transfer of Care (Signed)
Immediate Anesthesia Transfer of Care Note  Patient: [place]  Procedure(s) Performed: LAPAROSCOPIC REPAIR OF HIATAL HERNIA, nissen fundoplication (N/A Abdomen)  Patient Location: PACU  Anesthesia Type:General  Level of Consciousness: awake and alert   Airway & Oxygen Therapy: Patient Spontanous Breathing and Patient connected to face mask oxygen  Post-op Assessment: Report given to RN and Post -op Vital signs reviewed and stable  Post vital signs: Reviewed and stable  Last Vitals:  Vitals Value Taken Time  BP 152/59 03/24/20 1630  Temp    Pulse 87 03/24/20 1632  Resp 13 03/24/20 1632  SpO2 100 % 03/24/20 1632  Vitals shown include unvalidated device data.  Last Pain:  Vitals:   03/24/20 1211  PainSc: 0-No pain         Complications: No complications documented.

## 2020-03-24 NOTE — Anesthesia Procedure Notes (Signed)
Procedure Name: Intubation Date/Time: 03/24/2020 1:32 PM Performed by: West Pugh, CRNA Pre-anesthesia Checklist: Patient identified, Emergency Drugs available, Suction available, Patient being monitored and Timeout performed Patient Re-evaluated:Patient Re-evaluated prior to induction Oxygen Delivery Method: Circle system utilized Preoxygenation: Pre-oxygenation with 100% oxygen Induction Type: IV induction, Rapid sequence and Cricoid Pressure applied Laryngoscope Size: Mac and 3 Grade View: Grade I Tube type: Oral Tube size: 7.0 mm Number of attempts: 1 Airway Equipment and Method: Stylet Placement Confirmation: ETT inserted through vocal cords under direct vision,  positive ETCO2,  CO2 detector and breath sounds checked- equal and bilateral Secured at: 22 cm Tube secured with: Tape Dental Injury: Teeth and Oropharynx as per pre-operative assessment

## 2020-03-24 NOTE — Op Note (Signed)
DAVITA SUBLETT  Apr 24, 1941   03/24/2020    PCP:  Jettie Booze, NP   Surgeon: Kaylyn Lim, MD, FACS  Asst:  Romana Juniper, MD, FACS   Michaelle Birks, MD  Anes:  general  Preop Dx: Large type III hiatal hernia with most of stomach in the chest Postop Dx: same  Procedure: Laparoscopic reduction of hiatal hernia with closure of the diaphragm with 4 sutures posteriorly, right crural relaxation, Nissen fundoplication over a 56 lighted bougie Location Surgery: [facility] Complications: None noted  EBL:   minimal cc  Drains: none  Description of Procedure:  The patient was taken to OR 2 .  After anesthesia was administered and the patient was prepped  with chloroprep  and a timeout was performed.  Access to the abdomen was achieved with a 5 mm Optiview through the left upper quadrant.  The patient had a previous open cholecystectomy and adhesions to this old incision were taken down with harmonic scalpel.  Standard trocar placements were used with a 1011 to the right of the midline.  5 mm port was placed temporarily for the Nathanson retractor to retract the liver.  A very large hiatal hernia was noted with most all the stomach up in the chest.    We began on the right side incising the gastrohepatic window and then going up and opening along the right crus and began stripping the sac out of the chest.  We carried this across toward the left crus and again began stripping the sac out.  It teased out nicely and eventually were able to get all the sac out and down and get these stomach into the abdomen.  We excised the excess sac with harmonic scalpel and removed it.  It went up in the chest and and took down adhesions to the esophagus and freed it up to where it was in the abdomen without tension.  Next we began freeing up the right and left crura and when this was well demonstrated in all the excess was taken down with close the hiatal defect with 4 sutures of [place] secured with a tie knot.   The last one I went ahead and made a relaxing incision in the right crus to allow for a little less tension on the closure.  We then passed a 56 lighted bougie in a pretty well occupied the closure.  We had taken down the short gastrics on the left side and we are able to find a portion of stomach to wrap that was area near the apex of the fundus and it appeared to be contiguous with portion of stomach on the patient's left side. (shoe shine).  With a 56 lighted bougie in place the wrap was secured with 3 sutures getting his small purchase of periesophageal tissue and securing the top 2 with tie knots in the third arm was tied.  The bougie was removed without difficulty.  It appeared that she had a nice closure of the hiatus and wrap.  The stomach looked healthy.  No other abnormalities were noted.  At the beginning of the case we had done a tap block with 30 cc of Exparel on either side of the abdomen.  The abdomen was then deflated and the wounds were closed with 4-0 Monocryl and Dermabond.   The patient tolerated the procedure well and was taken to the PACU in stable condition.     Matt B. Hassell Done, [place], [facility], [place]

## 2020-03-25 ENCOUNTER — Observation Stay (HOSPITAL_COMMUNITY): Payer: Medicare Other

## 2020-03-25 ENCOUNTER — Encounter (HOSPITAL_COMMUNITY): Payer: Self-pay | Admitting: Surgery

## 2020-03-25 DIAGNOSIS — Z833 Family history of diabetes mellitus: Secondary | ICD-10-CM | POA: Diagnosis not present

## 2020-03-25 DIAGNOSIS — Z6836 Body mass index (BMI) 36.0-36.9, adult: Secondary | ICD-10-CM | POA: Diagnosis not present

## 2020-03-25 DIAGNOSIS — Z8261 Family history of arthritis: Secondary | ICD-10-CM | POA: Diagnosis not present

## 2020-03-25 DIAGNOSIS — D500 Iron deficiency anemia secondary to blood loss (chronic): Secondary | ICD-10-CM | POA: Diagnosis present

## 2020-03-25 DIAGNOSIS — K224 Dyskinesia of esophagus: Secondary | ICD-10-CM | POA: Diagnosis present

## 2020-03-25 DIAGNOSIS — E782 Mixed hyperlipidemia: Secondary | ICD-10-CM | POA: Diagnosis present

## 2020-03-25 DIAGNOSIS — K449 Diaphragmatic hernia without obstruction or gangrene: Secondary | ICD-10-CM | POA: Diagnosis present

## 2020-03-25 DIAGNOSIS — Z8719 Personal history of other diseases of the digestive system: Secondary | ICD-10-CM | POA: Diagnosis not present

## 2020-03-25 DIAGNOSIS — Z8249 Family history of ischemic heart disease and other diseases of the circulatory system: Secondary | ICD-10-CM | POA: Diagnosis not present

## 2020-03-25 DIAGNOSIS — Z8371 Family history of colonic polyps: Secondary | ICD-10-CM | POA: Diagnosis not present

## 2020-03-25 LAB — BASIC METABOLIC PANEL
Anion gap: 9 (ref 5–15)
BUN: 10 mg/dL (ref 8–23)
CO2: 26 mmol/L (ref 22–32)
Calcium: 8.9 mg/dL (ref 8.9–10.3)
Chloride: 102 mmol/L (ref 98–111)
Creatinine, Ser: 0.81 mg/dL (ref 0.44–1.00)
GFR, Estimated: >60 mL/min (ref >60)
Glucose, Bld: 162 mg/dL — ABNORMAL HIGH (ref 70–99)
Potassium: 4.4 mmol/L (ref 3.5–5.1)
Sodium: 137 mmol/L (ref 135–145)

## 2020-03-25 LAB — CBC
HCT: 28.6 % — ABNORMAL LOW (ref 36.0–46.0)
Hemoglobin: 7.7 g/dL — ABNORMAL LOW (ref 12.0–15.0)
MCH: 17.3 pg — ABNORMAL LOW (ref 26.0–34.0)
MCHC: 26.9 g/dL — ABNORMAL LOW (ref 30.0–36.0)
MCV: 64.3 fL — ABNORMAL LOW (ref 80.0–100.0)
Platelets: 453 10*3/uL — ABNORMAL HIGH (ref 150–400)
RBC: 4.45 MIL/uL (ref 3.87–5.11)
RDW: 22.8 % — ABNORMAL HIGH (ref 11.5–15.5)
WBC: 13.9 10*3/uL — ABNORMAL HIGH (ref 4.0–10.5)
nRBC: 0 % (ref 0.0–0.2)

## 2020-03-25 MED ORDER — IOHEXOL 300 MG/ML  SOLN: 150.0000 mL | Freq: Once | INTRAMUSCULAR | Status: DC | PRN

## 2020-03-25 NOTE — Progress Notes (Signed)
Patient ID: Donna Barron, female   DOB: 09-07-1940, 79 y.o.   MRN: [id number] [facility] Surgery Progress Note:   1 Day Post-Op  Subjective: Mental status is clear.  Complaints none. Objective: Vital signs in last 24 hours: Temp:  [97.5 F (36.4 C)-98.3 F (36.8 C)] 97.5 F (36.4 C) (10/12 0529) Pulse Rate:  [67-87] 67 (10/12 0529) Resp:  [12-23] 16 (10/12 0529) BP: (103-152)/(50-67) 118/62 (10/12 0529) SpO2:  [92 %-100 %] 99 % (10/12 0529) Weight:  [89.8 kg] 89.8 kg (10/11 1145)  Intake/Output from previous day: 10/11 0701 - 10/12 0700 In: 2750 [I.V.:2650; IV Piggyback:100] Out: 1330 [Urine:1280; Blood:50] Intake/Output this shift: Total I/O In: 120 [P.O.:120] Out: 100 [Urine:100]  Physical Exam: Work of breathing is normal.  Incisions ok  Lab Results:  Results for orders placed or performed during the hospital encounter of 03/24/20 (from the past 48 hour(s))  Type and screen [place]     Status: None (Preliminary result)   Collection Time: 03/24/20 12:00 PM  Result Value Ref Range   ABO/RH(D) A POS    Antibody Screen NEG    Sample Expiration 03/27/2020,2359    Unit Number [id number]    Blood Component Type RBC LR PHER2    Unit division 00    Status of Unit ISSUED,FINAL    Transfusion Status OK TO TRANSFUSE    Crossmatch Result      Compatible Performed at [facility], [place] [address] [place], [place] [address]    Unit Number [id number]    Blood Component Type RBC LR PHER2    Unit division 00    Status of Unit ALLOCATED    Transfusion Status OK TO TRANSFUSE    Crossmatch Result Compatible   ABO/Rh     Status: None   Collection Time: 03/24/20 12:25 PM  Result Value Ref Range   ABO/RH(D)      A POS Performed at [facility], [place] [address], [place], [place] [address]   CBC with Differential/Platelet     Status: Abnormal   Collection Time: 03/24/20 12:25 PM  Result Value Ref Range   WBC  8.1 4.0 - 10.5 K/uL   RBC 4.86 3.87 - 5.11 MIL/uL   Hemoglobin 7.7 (L) 12.0 - 15.0 g/dL    Comment: Reticulocyte Hemoglobin testing may be clinically indicated, consider ordering this additional test [id number]    HCT 30.1 (L) 36 - 46 %   MCV 61.9 (L) 80.0 - 100.0 fL   MCH 15.8 (L) 26.0 - 34.0 pg   MCHC 25.6 (L) 30.0 - 36.0 g/dL   RDW 21.2 (H) 11.5 - 15.5 %   Platelets 541 (H) 150 - 400 K/uL   nRBC 0.0 0.0 - 0.2 %   Neutrophils Relative % 68 %   Neutro Abs 5.5 1.7 - 7.7 K/uL   Lymphocytes Relative 22 %   Lymphs Abs 1.8 0.7 - 4.0 K/uL   Monocytes Relative 8 %   Monocytes Absolute 0.6 0.1 - 1.0 K/uL   Eosinophils Relative 1 %   Eosinophils Absolute 0.1 0 - 0 K/uL   Basophils Relative 1 %   Basophils Absolute 0.1 0 - 0 K/uL   Immature Granulocytes 0 %   Abs Immature Granulocytes 0.02 0.00 - 0.07 K/uL   Polychromasia PRESENT    Ovalocytes PRESENT     Comment: Performed at [facility], [place] [address] [place], [place] [address]  Hemoglobin and hematocrit, blood  Status: Abnormal   Collection Time: 03/24/20  5:10 PM  Result Value Ref Range   Hemoglobin 8.5 (L) 12.0 - 15.0 g/dL   HCT 32.0 (L) 36 - 46 %    Comment: Performed at [facility], [place] [address], [place], [place] [address]  CBC     Status: Abnormal   Collection Time: 03/24/20  7:02 PM  Result Value Ref Range   WBC 18.3 (H) 4.0 - 10.5 K/uL   RBC 5.00 3.87 - 5.11 MIL/uL   Hemoglobin 8.6 (L) 12.0 - 15.0 g/dL    Comment: Reticulocyte Hemoglobin testing may be clinically indicated, consider ordering this additional test [id number]    HCT 32.5 (L) 36 - 46 %   MCV 65.0 (L) 80.0 - 100.0 fL   MCH 17.2 (L) 26.0 - 34.0 pg   MCHC 26.5 (L) 30.0 - 36.0 g/dL   RDW 23.8 (H) 11.5 - 15.5 %   Platelets 508 (H) 150 - 400 K/uL   nRBC 0.0 0.0 - 0.2 %    Comment: Performed at [facility], [place] [address] [place], [place] [address]  Creatinine, serum     Status: None    Collection Time: 03/24/20  7:02 PM  Result Value Ref Range   Creatinine, Ser 0.91 0.44 - 1.00 mg/dL   GFR, Estimated >60 >60 mL/min    Comment: Performed at [facility], [place] [address], [place], [place] [address]  CBC     Status: Abnormal   Collection Time: 03/25/20  5:09 AM  Result Value Ref Range   WBC 13.9 (H) 4.0 - 10.5 K/uL   RBC 4.45 3.87 - 5.11 MIL/uL   Hemoglobin 7.7 (L) 12.0 - 15.0 g/dL    Comment: Reticulocyte Hemoglobin testing may be clinically indicated, consider ordering this additional test [id number]    HCT 28.6 (L) 36 - 46 %   MCV 64.3 (L) 80.0 - 100.0 fL   MCH 17.3 (L) 26.0 - 34.0 pg   MCHC 26.9 (L) 30.0 - 36.0 g/dL   RDW 22.8 (H) 11.5 - 15.5 %   Platelets 453 (H) 150 - 400 K/uL   nRBC 0.0 0.0 - 0.2 %    Comment: Performed at [facility], [place] [address] [place], [place] [address]  Basic metabolic panel     Status: Abnormal   Collection Time: 03/25/20  5:09 AM  Result Value Ref Range   Sodium 137 135 - 145 mmol/L   Potassium 4.4 3.5 - 5.1 mmol/L   Chloride 102 98 - 111 mmol/L   CO2 26 22 - 32 mmol/L   Glucose, Bld 162 (H) 70 - 99 mg/dL    Comment: Glucose reference range applies only to samples taken after fasting for at least 8 hours.   BUN 10 8 - 23 mg/dL   Creatinine, Ser 0.81 0.44 - 1.00 mg/dL   Calcium 8.9 8.9 - 10.3 mg/dL   GFR, Estimated >60 >60 mL/min   Anion gap 9 5 - 15    Comment: Performed at [facility], [place] [address], [place], [place] [address]    Radiology/Results: DG UGI W SINGLE CM (SOL OR THIN BA)  Result Date: 03/25/2020 CLINICAL DATA:  Postop day 1 Nissen fundoplication for hiatal hernia repair. EXAM: WATER SOLUBLE UPPER GI SERIES TECHNIQUE: Single-column upper GI series was performed using water soluble contrast. CONTRAST:  Omnipaque 300 p.o. COMPARISON:  Upper GI 01/01/2020 FLUOROSCOPY TIME:  Fluoroscopy Time:  1 minutes 48  seconds Radiation Exposure Index (if provided by  the fluoroscopic device): Number of Acquired Spot Images: 12 FINDINGS: Decreased esophageal peristalsis. No stricture or mass. No aspiration identified. Postop hiatal hernia repair. There is reduction of the hiatal hernia. GE junction is patent. No leak identified. No obstruction. Gastric mucosa is normal. No mass or edema. There is normal emptying into the duodenum. Duodenal bulb appears normal. IMPRESSION: Satisfactory appearance post Nissan fundoplication. Hiatal hernia has been reduced. No leak or obstruction identified. Electronically Signed   By: Franchot Gallo M.D.   On: 03/25/2020 09:09    Anti-infectives: Anti-infectives (From admission, onward)   Start     Dose/Rate Route Frequency Ordered Stop   03/25/20 0100  cefoTEtan (CEFOTAN) 2 g in sodium chloride 0.9 % 100 mL IVPB        2 g 200 mL/hr over 30 Minutes Intravenous Every 12 hours 03/24/20 1816 03/25/20 0107   03/24/20 1153  sodium chloride 0.9 % with cefoTEtan (CEFOTAN) ADS Med       Note to Pharmacy: Kyra Leyland   : cabinet override      03/24/20 1153 03/24/20 1349   03/24/20 1145  cefoTEtan (CEFOTAN) 2 g in sodium chloride 0.9 % 100 mL IVPB        2 g 200 mL/hr over 30 Minutes Intravenous On call to O.R. 03/24/20 1142 03/24/20 1403      Assessment/Plan: Problem List: Patient Active Problem List   Diagnosis Date Noted  . Status post Nissen fundoplication Oct 3846 65/99/3570  . Status post laparoscopic Nissen fundoplication 17/79/3903  . Dysphagia 09/11/2019  . S/P left rotator cuff repair 06/15/18 06/15/2018  . Nontraumatic complete tear of left rotator cuff   . Class 2 severe obesity with serious comorbidity and body mass index (BMI) of 35.0 to 35.9 in adult ([place]) 05/23/2018  . S/P carpal tunnel and DeQuervains release right 07/14/17 07/20/2017  . Carpal tunnel syndrome of right wrist   . De Quervain's disease (tenosynovitis)   . Palpitations 06/28/2013  . Mixed hyperlipidemia 06/28/2013  . Obesity, unspecified  06/28/2013    UGI looks good.  Begin clears and advance.  Hopeful discharge tomorrow.   1 Day Post-Op    LOS: 0 days   Matt B. Hassell Done, MD, [facility] [phone] to reach the surgeon on call.    03/25/2020 11:04 AM

## 2020-03-26 LAB — CBC WITH DIFFERENTIAL/PLATELET
Abs Immature Granulocytes: 0.08 10*3/uL — ABNORMAL HIGH (ref 0.00–0.07)
Basophils Absolute: 0.1 10*3/uL (ref 0.0–0.1)
Basophils Relative: 0 %
Eosinophils Absolute: 0 10*3/uL (ref 0.0–0.5)
Eosinophils Relative: 0 %
HCT: 28.9 % — ABNORMAL LOW (ref 36.0–46.0)
Hemoglobin: 7.6 g/dL — ABNORMAL LOW (ref 12.0–15.0)
Immature Granulocytes: 1 %
Lymphocytes Relative: 16 %
Lymphs Abs: 2 10*3/uL (ref 0.7–4.0)
MCH: 17.3 pg — ABNORMAL LOW (ref 26.0–34.0)
MCHC: 26.3 g/dL — ABNORMAL LOW (ref 30.0–36.0)
MCV: 65.8 fL — ABNORMAL LOW (ref 80.0–100.0)
Monocytes Absolute: 1.3 10*3/uL — ABNORMAL HIGH (ref 0.1–1.0)
Monocytes Relative: 10 %
Neutro Abs: 9.1 10*3/uL — ABNORMAL HIGH (ref 1.7–7.7)
Neutrophils Relative %: 73 %
Platelets: 455 10*3/uL — ABNORMAL HIGH (ref 150–400)
RBC: 4.39 MIL/uL (ref 3.87–5.11)
RDW: 23.7 % — ABNORMAL HIGH (ref 11.5–15.5)
WBC: 12.6 10*3/uL — ABNORMAL HIGH (ref 4.0–10.5)
nRBC: 0 % (ref 0.0–0.2)

## 2020-03-26 NOTE — Progress Notes (Signed)
Pt alert and oriented. Felt a little nauseated so I gave her some Zofran. D/C instructions were given and pt was d/cd home.

## 2020-03-26 NOTE — Discharge Summary (Signed)
Physician Discharge Summary  Patient ID: Donna Barron MRN: [id number] DOB/AGE: [birth year] 78 y.o.  PCP: Jettie Booze, NP  Admit date: 03/24/2020 Discharge date: 03/26/2020  Admission Diagnoses:  Large type III hiatal hernia with most of stomach in chest  Discharge Diagnoses:  same  Principal Problem:   Status post Nissen fundoplication Oct 3478 Active Problems:   Status post laparoscopic Nissen fundoplication   Surgery:  Lap repair of hiatal hernia with Nissen fundoplication  Discharged Condition: improved  Hospital Course:   Had surgery and started on liquids.  One unit of blood given during surgery for anemia.  Tolerated surgery well and was ready for discharge on PD 78   Consults: none  Significant Diagnostic Studies: UGI revealed no leaks and repair looked good     Discharge Exam: Blood pressure (!) 110/58, pulse 76, temperature 98.4 F (36.9 C), temperature source Oral, resp. rate 18, height 5\' 2"  (1.575 m), weight 89.8 kg, SpO2 91 %. Incisions are doing well  Disposition: Discharge disposition: 01-Home or Self Care       Discharge Instructions    Call MD for:  persistant nausea and vomiting   Complete by: As directed    Call MD for:  redness, tenderness, or signs of infection (pain, swelling, redness, odor or green/yellow discharge around incision site)   Complete by: As directed    Diet - low sodium heart healthy   Complete by: As directed    Discharge instructions   Complete by: As directed    Full liquid diet for 1 week then pureed diet for 3 weeks.   Discharge wound care:   Complete by: As directed    You may shower when you get home.   Increase activity slowly   Complete by: As directed      Allergies as of 03/26/2020      Reactions   Hydrocodone    Lip swelling   Avelox [moxifloxacin Hcl In Nacl] Other (See Comments)   insomnia   Codeine Swelling   Swelling of lips   Oxybutynin Palpitations, Other (See Comments)   Tongue felt coated        Medication List    TAKE these medications   escitalopram 20 MG tablet Commonly known as: LEXAPRO Take 20 mg by mouth at bedtime.   fluticasone 50 MCG/ACT nasal spray Commonly known as: FLONASE Place 1 spray into both nostrils daily as needed for allergies. Allergies/sinus issues.   LORazepam 0.5 MG tablet Commonly known as: ATIVAN Take 0.5 mg by mouth 2 (two) times daily as needed for anxiety.   Maxzide 75-50 MG tablet Generic drug: triamterene-hydrochlorothiazide Take 0.5-1 tablets by mouth daily as needed (blood pressure/fluid retention.).   metoprolol succinate 50 MG 24 hr tablet Commonly known as: TOPROL-XL Take 50 mg by mouth daily. Take with or immediately following a meal.   nystatin 100000 UNIT/ML suspension Commonly known as: MYCOSTATIN Use as directed 5 mLs in the mouth or throat daily as needed (mouth sores).   pantoprazole 40 MG tablet Commonly known as: PROTONIX Take 40 mg by mouth daily.   polyethylene glycol powder 17 GM/SCOOP powder Commonly known as: GLYCOLAX/MIRALAX Take 255 g by mouth daily. Take as directed on prep instructions   simvastatin 40 MG tablet Commonly known as: ZOCOR Take 40 mg by mouth at bedtime.            Discharge Care Instructions  (From admission, onward)         Start  Ordered   03/26/20 0000  Discharge wound care:       Comments: You may shower when you get home.   03/26/20 0818           Signed: [name] 03/26/2020, 8:18 AM

## 2020-03-28 LAB — TYPE AND SCREEN
ABO/RH(D): A POS
Antibody Screen: NEGATIVE
Unit division: 0
Unit division: 0

## 2020-03-28 LAB — BPAM RBC
Blood Product Expiration Date: 202110312359
Blood Product Expiration Date: 202110312359
ISSUE DATE / TIME: 202110111541
Unit Type and Rh: 6200
Unit Type and Rh: 6200

## 2020-07-04 ENCOUNTER — Other Ambulatory Visit: Payer: Self-pay | Admitting: Surgery

## 2020-07-04 DIAGNOSIS — Z9889 Other specified postprocedural states: Secondary | ICD-10-CM

## 2020-07-18 ENCOUNTER — Other Ambulatory Visit: Payer: Self-pay

## 2020-07-18 ENCOUNTER — Ambulatory Visit
Admission: RE | Admit: 2020-07-18 | Discharge: 2020-07-18 | Disposition: A | Discharge status: Home / Self Care | Payer: Medicare Other | Source: Ambulatory Visit | Attending: Surgery | Admitting: Surgery

## 2020-07-18 DIAGNOSIS — Z9889 Other specified postprocedural states: Secondary | ICD-10-CM

## 2020-07-18 MED ORDER — IOPAMIDOL (ISOVUE-300) INJECTION 61%
100.0000 mL | Freq: Once | INTRAVENOUS | Status: AC | PRN
  Administered 2020-07-18: 100 mL via INTRAVENOUS

## 2021-02-09 ENCOUNTER — Ambulatory Visit (INDEPENDENT_AMBULATORY_CARE_PROVIDER_SITE_OTHER): Payer: Medicare Other | Admitting: Orthopedic Surgery

## 2021-02-09 ENCOUNTER — Encounter: Payer: Self-pay | Admitting: Orthopedic Surgery

## 2021-02-09 ENCOUNTER — Other Ambulatory Visit: Payer: Self-pay

## 2021-02-09 ENCOUNTER — Ambulatory Visit: Payer: Medicare Other

## 2021-02-09 VITALS — BP 144/69 | HR 81 | Ht 62.0 in | Wt 230.0 lb

## 2021-02-09 DIAGNOSIS — Z6841 Body Mass Index (BMI) 40.0 and over, adult: Secondary | ICD-10-CM

## 2021-02-09 DIAGNOSIS — M1712 Unilateral primary osteoarthritis, left knee: Secondary | ICD-10-CM | POA: Diagnosis not present

## 2021-02-09 DIAGNOSIS — M25562 Pain in left knee: Secondary | ICD-10-CM

## 2021-02-09 DIAGNOSIS — G8929 Other chronic pain: Secondary | ICD-10-CM

## 2021-02-09 NOTE — Patient Instructions (Addendum)
Recommend weight loss  You can use Tylenol Advil or Aleve for pain as needed  You have received an injection of steroids into the joint. 15% of patients will have increased pain within the 24 hours postinjection.   This is transient and will go away.   We recommend that you use ice packs on the injection site for 20 minutes every 2 hours and extra strength Tylenol 2 tablets every 8 as needed until the pain resolves.  If you continue to have pain after taking the Tylenol and using the ice please call the office for further instructions.

## 2021-02-09 NOTE — Progress Notes (Signed)
Chief Complaint  Patient presents with   Knee Pain    Left/     79 year old female status postarthroscopy left knee and medial meniscectomy and stress fracture many years ago treated in [place] comes in with chronic left knee pain worsening on the medial side constant with no giving way.   BP (!) 144/69   Pulse 81   Ht '5\' 2"'$  (1.575 m)   Wt 230 lb (104.3 kg)   BMI 42.07 kg/m   The patient meets the AMA guidelines for Morbid (severe) obesity with a BMI > 40.0 and I have recommended weight loss.  Examination shows full range of motion of the left knee with 4 out of 4 plus medial joint line tenderness without instability.  Imaging  The x-rays show narrowing of the medial compartment sclerosis of the subchondral bone and a small osteophyte medially  We decided to proceed with an injection of the left knee jointProcedure note  Injection  Verbal consent was obtained to inject the left knee  Timeout procedure was completed to confirm injection site  Diagnosis osteoarthritis  Medications used Celestone 6 Lidocaine 1% plain 3 cc  Anesthesia was provided by ethyl chloride spray  Prep was performed with alcohol  Technique of injection the knee was flexed 90 degrees lateral portal was used to inject the joint with a [id number] needle  No complications were noted  Plan follow-up can be as needed she can use Tylenol Advil or Aleve for knee pain recommend weight loss       Past Medical History:  Diagnosis Date   Anxiety    Cataract    Colon polyps    Depression    Dysrhythmia    heart palipations   Gallstones    GERD (gastroesophageal reflux disease)    HTN (hypertension)    Hypercholesteremia    PONV (postoperative nausea and vomiting)     Past Surgical History:  Procedure Laterality Date   APPENDECTOMY     ARTHROSCOPIC REPAIR ACL  05/2008   Left knee   CARPAL TUNNEL RELEASE Right 07/14/2017   Procedure: CARPAL TUNNEL RELEASE;  Surgeon: Carole Civil,  MD;  Location: [facility];  Service: Orthopedics;  Laterality: Right;   CATARACT EXTRACTION, BILATERAL  2020   CHOLECYSTECTOMY     DILATION AND CURETTAGE OF UTERUS     DORSAL COMPARTMENT RELEASE Right 07/14/2017   Procedure: RELEASE DORSAL COMPARTMENT (DEQUERVAIN);  Surgeon: Carole Civil, MD;  Location: [facility];  Service: Orthopedics;  Laterality: Right;   HIATAL HERNIA REPAIR N/A 03/24/2020   Procedure: LAPAROSCOPIC REPAIR OF HIATAL HERNIA, nissen fundoplication;  Surgeon: Johnathan Hausen, MD;  Location: [facility];  Service: General;  Laterality: N/A;   HYSTEROSCOPY WITH D & C N/A 12/25/2013   Procedure: DILATATION AND CURETTAGE /HYSTEROSCOPY;  Surgeon: Gus Height, MD;  Location: [facility];  Service: Gynecology;  Laterality: N/A;   SHOULDER OPEN ROTATOR CUFF REPAIR Left 06/15/2018   Procedure: ROTATOR CUFF REPAIR SHOULDER OPEN WITH ACROMIOPLASTY;  Surgeon: Carole Civil, MD;  Location: [facility];  Service: Orthopedics;  Laterality: Left;   TUBAL LIGATION

## 2021-02-12 ENCOUNTER — Ambulatory Visit: Payer: Medicare Other | Admitting: Orthopedic Surgery

## 2021-04-20 ENCOUNTER — Encounter (HOSPITAL_COMMUNITY): Payer: Self-pay | Admitting: Emergency Medicine

## 2021-04-20 ENCOUNTER — Emergency Department (HOSPITAL_COMMUNITY)
Admission: EM | Admit: 2021-04-20 | Discharge: 2021-04-21 | Disposition: A | Discharge status: Home / Self Care | Payer: Medicare Other | Attending: Emergency Medicine | Admitting: Emergency Medicine

## 2021-04-20 ENCOUNTER — Other Ambulatory Visit: Payer: Self-pay

## 2021-04-20 DIAGNOSIS — Z5321 Procedure and treatment not carried out due to patient leaving prior to being seen by health care provider: Secondary | ICD-10-CM | POA: Diagnosis not present

## 2021-04-20 DIAGNOSIS — R079 Chest pain, unspecified: Secondary | ICD-10-CM | POA: Diagnosis not present

## 2021-04-20 DIAGNOSIS — R300 Dysuria: Secondary | ICD-10-CM | POA: Diagnosis not present

## 2021-04-20 DIAGNOSIS — R410 Disorientation, unspecified: Secondary | ICD-10-CM | POA: Insufficient documentation

## 2021-04-20 DIAGNOSIS — Z20822 Contact with and (suspected) exposure to covid-19: Secondary | ICD-10-CM | POA: Diagnosis not present

## 2021-04-20 DIAGNOSIS — R4182 Altered mental status, unspecified: Secondary | ICD-10-CM | POA: Insufficient documentation

## 2021-04-20 NOTE — ED Provider Notes (Signed)
Emergency Medicine Provider Triage Evaluation Note  Donna Barron , a 80 y.o. female  was evaluated in triage.  Pt complains of confusion today.  Per patient's daughter last known normal was last night prior to the patient going to bed.  This morning she woke up confused, they describe this is her demonstrating abnormal behaviors and making odd phone calls.  Her confusion seems to be improving.  Her symptoms are similar to when she had an UTI 2 weeks ago, she recently just finished her antibiotics.  She does report dysuria.  They checked her blood pressure earlier and it was elevated, at that time she was having some chest discomfort-this lasted about an hour and then resolved.  No chest pain at present.  Review of Systems  Positive: Confusion, chest pain, dysuria Negative: Vision change, numbness, weakness, slurred speech, facial droop, dizziness, shortness of breath, fever  Physical Exam  BP (!) 164/90 (BP Location: Right Arm)   Pulse 91   Temp 98.4 F (36.9 C) (Oral)   Resp 20   SpO2 95%  Gen:   Awake, no distress   Resp:  Normal effort  MSK:   Moves extremities without difficulty  Other:  Alert.  Clear speech.  Vision grossly intact. Pupils are mildly asymmetric with right being larger than the left, each are reactive to light.  Extraocular movements are intact.  There is no facial droop.  Sensation is grossly intact to bilateral upper and lower extremities.  5 out of 5 symmetric grip strength and strength with plantar dorsiflexion bilaterally.  Negative pronator drift.  Normal finger-to-nose.  Medical Decision Making  Medically screening exam initiated at 11:47 PM.  Appropriate orders placed.  Donna Barron was informed that the remainder of the evaluation will be completed by another provider, this initial triage assessment does not replace that evaluation, and the importance of remaining in the ED until their evaluation is complete.  AMS. Mild pupil asymmetric- patient and her  daughter are unsure if this has been present previously or when this started. Neuro exam otherwise fairly unremarkable at this time.   > 4.5 hours from onset of sxs, VAN negative.   AMS/CP work-up initiated     Donna Barron 04/20/21 [place], [place], MD 04/21/21 [phone]

## 2021-04-20 NOTE — ED Triage Notes (Signed)
Patient reports brief confusion this morning and central chest pain radiating to left arm today , no SOB , denies emesis or diaphoresis , she added burning sensation when urinating .

## 2021-04-21 ENCOUNTER — Emergency Department (HOSPITAL_COMMUNITY): Payer: Medicare Other

## 2021-04-21 DIAGNOSIS — R410 Disorientation, unspecified: Secondary | ICD-10-CM | POA: Diagnosis not present

## 2021-04-21 LAB — URINALYSIS, ROUTINE W REFLEX MICROSCOPIC
Bacteria, UA: NONE SEEN
Bilirubin Urine: NEGATIVE
Glucose, UA: NEGATIVE mg/dL
Ketones, ur: 5 mg/dL — AB
Leukocytes,Ua: NEGATIVE
Nitrite: NEGATIVE
Protein, ur: 30 mg/dL — AB
Specific Gravity, Urine: 1.028 (ref 1.005–1.030)
pH: 5 (ref 5.0–8.0)

## 2021-04-21 LAB — CBC
HCT: 49.9 % — ABNORMAL HIGH (ref 36.0–46.0)
Hemoglobin: 16 g/dL — ABNORMAL HIGH (ref 12.0–15.0)
MCH: 29.3 pg (ref 26.0–34.0)
MCHC: 32.1 g/dL (ref 30.0–36.0)
MCV: 91.2 fL (ref 80.0–100.0)
Platelets: 354 10*3/uL (ref 150–400)
RBC: 5.47 MIL/uL — ABNORMAL HIGH (ref 3.87–5.11)
RDW: 14.4 % (ref 11.5–15.5)
WBC: 9.3 10*3/uL (ref 4.0–10.5)
nRBC: 0 % (ref 0.0–0.2)

## 2021-04-21 LAB — TROPONIN I (HIGH SENSITIVITY)
Troponin I (High Sensitivity): 35 ng/L — ABNORMAL HIGH (ref <18)
Troponin I (High Sensitivity): 30 ng/L — ABNORMAL HIGH (ref <18)

## 2021-04-21 LAB — RAPID URINE DRUG SCREEN, HOSP PERFORMED
Amphetamines: NOT DETECTED
Barbiturates: NOT DETECTED
Benzodiazepines: NOT DETECTED
Cocaine: NOT DETECTED
Opiates: NOT DETECTED
Tetrahydrocannabinol: NOT DETECTED

## 2021-04-21 LAB — DIFFERENTIAL
Abs Immature Granulocytes: 0.03 10*3/uL (ref 0.00–0.07)
Basophils Absolute: 0.1 10*3/uL (ref 0.0–0.1)
Basophils Relative: 1 %
Eosinophils Absolute: 0 10*3/uL (ref 0.0–0.5)
Eosinophils Relative: 0 %
Immature Granulocytes: 0 %
Lymphocytes Relative: 19 %
Lymphs Abs: 1.8 10*3/uL (ref 0.7–4.0)
Monocytes Absolute: 0.9 10*3/uL (ref 0.1–1.0)
Monocytes Relative: 9 %
Neutro Abs: 6.6 10*3/uL (ref 1.7–7.7)
Neutrophils Relative %: 71 %

## 2021-04-21 LAB — COMPREHENSIVE METABOLIC PANEL
ALT: 32 U/L (ref 0–44)
AST: 27 U/L (ref 15–41)
Albumin: 3.7 g/dL (ref 3.5–5.0)
Alkaline Phosphatase: 86 U/L (ref 38–126)
Anion gap: 13 (ref 5–15)
BUN: 16 mg/dL (ref 8–23)
CO2: 25 mmol/L (ref 22–32)
Calcium: 9.9 mg/dL (ref 8.9–10.3)
Chloride: 101 mmol/L (ref 98–111)
Creatinine, Ser: 0.99 mg/dL (ref 0.44–1.00)
GFR, Estimated: 58 mL/min — ABNORMAL LOW (ref >60)
Glucose, Bld: 132 mg/dL — ABNORMAL HIGH (ref 70–99)
Potassium: 3.5 mmol/L (ref 3.5–5.1)
Sodium: 139 mmol/L (ref 135–145)
Total Bilirubin: 1 mg/dL (ref 0.3–1.2)
Total Protein: 7.4 g/dL (ref 6.5–8.1)

## 2021-04-21 LAB — APTT: aPTT: 25 seconds (ref 24–36)

## 2021-04-21 LAB — RESP PANEL BY RT-PCR (FLU A&B, COVID) ARPGX2
Influenza A by PCR: NEGATIVE
Influenza B by PCR: NEGATIVE
SARS Coronavirus 2 by RT PCR: NEGATIVE

## 2021-04-21 LAB — PROTIME-INR
INR: 1.1 (ref 0.8–1.2)
Prothrombin Time: 14 seconds (ref 11.4–15.2)

## 2021-04-21 LAB — ETHANOL: Alcohol, Ethyl (B): <10 mg/dL (ref <10)

## 2021-04-21 NOTE — ED Notes (Signed)
Patient left on own accord °

## 2021-05-12 ENCOUNTER — Encounter (HOSPITAL_COMMUNITY): Payer: Self-pay | Admitting: *Deleted

## 2021-05-12 ENCOUNTER — Emergency Department (HOSPITAL_COMMUNITY)
Admission: EM | Admit: 2021-05-12 | Discharge: 2021-05-12 | Disposition: A | Discharge status: Home / Self Care | Payer: Medicare Other | Attending: Emergency Medicine | Admitting: Emergency Medicine

## 2021-05-12 ENCOUNTER — Emergency Department (HOSPITAL_COMMUNITY): Payer: Medicare Other

## 2021-05-12 ENCOUNTER — Other Ambulatory Visit: Payer: Self-pay

## 2021-05-12 DIAGNOSIS — R410 Disorientation, unspecified: Secondary | ICD-10-CM | POA: Insufficient documentation

## 2021-05-12 DIAGNOSIS — Z7982 Long term (current) use of aspirin: Secondary | ICD-10-CM | POA: Diagnosis not present

## 2021-05-12 DIAGNOSIS — I10 Essential (primary) hypertension: Secondary | ICD-10-CM | POA: Insufficient documentation

## 2021-05-12 DIAGNOSIS — I6501 Occlusion and stenosis of right vertebral artery: Secondary | ICD-10-CM | POA: Diagnosis not present

## 2021-05-12 DIAGNOSIS — Z79899 Other long term (current) drug therapy: Secondary | ICD-10-CM | POA: Insufficient documentation

## 2021-05-12 DIAGNOSIS — R0602 Shortness of breath: Secondary | ICD-10-CM | POA: Insufficient documentation

## 2021-05-12 DIAGNOSIS — Z7901 Long term (current) use of anticoagulants: Secondary | ICD-10-CM | POA: Diagnosis not present

## 2021-05-12 DIAGNOSIS — R4182 Altered mental status, unspecified: Secondary | ICD-10-CM | POA: Diagnosis present

## 2021-05-12 DIAGNOSIS — I4891 Unspecified atrial fibrillation: Secondary | ICD-10-CM | POA: Diagnosis not present

## 2021-05-12 LAB — URINALYSIS, ROUTINE W REFLEX MICROSCOPIC
Bilirubin Urine: NEGATIVE
Glucose, UA: NEGATIVE mg/dL
Ketones, ur: NEGATIVE mg/dL
Leukocytes,Ua: NEGATIVE
Nitrite: NEGATIVE
Protein, ur: NEGATIVE mg/dL
Specific Gravity, Urine: 1.01 (ref 1.005–1.030)
pH: 5 (ref 5.0–8.0)

## 2021-05-12 LAB — COMPREHENSIVE METABOLIC PANEL
ALT: 24 U/L (ref 0–44)
AST: 30 U/L (ref 15–41)
Albumin: 3.9 g/dL (ref 3.5–5.0)
Alkaline Phosphatase: 84 U/L (ref 38–126)
Anion gap: 9 (ref 5–15)
BUN: 11 mg/dL (ref 8–23)
CO2: 26 mmol/L (ref 22–32)
Calcium: 9 mg/dL (ref 8.9–10.3)
Chloride: 100 mmol/L (ref 98–111)
Creatinine, Ser: 0.88 mg/dL (ref 0.44–1.00)
GFR, Estimated: >60 mL/min (ref >60)
Glucose, Bld: 132 mg/dL — ABNORMAL HIGH (ref 70–99)
Potassium: 3.8 mmol/L (ref 3.5–5.1)
Sodium: 135 mmol/L (ref 135–145)
Total Bilirubin: 0.2 mg/dL — ABNORMAL LOW (ref 0.3–1.2)
Total Protein: 7.7 g/dL (ref 6.5–8.1)

## 2021-05-12 LAB — CBC
HCT: 47 % — ABNORMAL HIGH (ref 36.0–46.0)
Hemoglobin: 15.3 g/dL — ABNORMAL HIGH (ref 12.0–15.0)
MCH: 30.3 pg (ref 26.0–34.0)
MCHC: 32.6 g/dL (ref 30.0–36.0)
MCV: 93.1 fL (ref 80.0–100.0)
Platelets: 290 10*3/uL (ref 150–400)
RBC: 5.05 MIL/uL (ref 3.87–5.11)
RDW: 14 % (ref 11.5–15.5)
WBC: 9.7 10*3/uL (ref 4.0–10.5)
nRBC: 0 % (ref 0.0–0.2)

## 2021-05-12 LAB — URINALYSIS, MICROSCOPIC (REFLEX): WBC, UA: NONE SEEN WBC/hpf (ref 0–5)

## 2021-05-12 MED ORDER — APIXABAN 5 MG PO TABS: 5.0000 mg | ORAL_TABLET | Freq: Two times a day (BID) | ORAL | 1 refills | Status: DC

## 2021-05-12 NOTE — ED Notes (Signed)
Called MRI for 3rd time about MRI not being read.  MRI to call radiologist.

## 2021-05-12 NOTE — Discharge Instructions (Signed)
Stop taking your aspirin and then start taking Eliquis twice a day.  Follow-up with Dr. Merlene Laughter for neurology and follow-up with Dr. Harl Bowie or one of his partners for cardiology and the irregular heartbeat you had briefly here

## 2021-05-12 NOTE — ED Notes (Signed)
Patient in MRI 

## 2021-05-12 NOTE — ED Notes (Signed)
Patient went into vent bigemny in the 140's.  MD notified repeat ekg obtained

## 2021-05-12 NOTE — ED Triage Notes (Signed)
Pt's daughter reports pt has had AMS x 2-3 weeks. She reports pt has been warming food up in the dryer. She took her to PCP and told she had a UTI and given antibiotics. Daughter reports the symptoms have continued. Last night pt started c/o left sided chest pain. Pt reports today she feels like she's having a mini stroke because her left arm is swollen.

## 2021-05-12 NOTE — ED Provider Notes (Signed)
[facility] EMERGENCY DEPARTMENT Provider Note   CSN: [id number] Arrival date & time: 05/12/21  0707     History Chief Complaint  Patient presents with   Altered Mental Status    Donna Barron is a 80 y.o. female.      Pt presents to the ed because she had 3 episodes of confusion lasting about 3 hours each over the last 6 weeks  The history is provided by the patient and medical records. No language interpreter was used.  Altered Mental Status Presenting symptoms: no behavior changes   Severity:  Moderate Most recent episode:  Yesterday Episode history:  Single Timing:  Intermittent Progression:  Resolved Chronicity:  New Context: not alcohol use   Associated symptoms: no abdominal pain, no hallucinations, no headaches, no rash and no seizures       Past Medical History:  Diagnosis Date   Anxiety    Cataract    Colon polyps    Depression    Dysrhythmia    heart palipations   Gallstones    GERD (gastroesophageal reflux disease)    HTN (hypertension)    Hypercholesteremia    PONV (postoperative nausea and vomiting)     Patient Active Problem List   Diagnosis Date Noted   Status post Nissen fundoplication Oct 7062 37/62/8315   Status post laparoscopic Nissen fundoplication 17/61/6073   Overactive bladder 12/04/2019   Dysphagia 09/11/2019   S/P left rotator cuff repair 06/15/18 06/15/2018   Nontraumatic complete tear of left rotator cuff    Class 2 severe obesity with serious comorbidity and body mass index (BMI) of 35.0 to 35.9 in adult (Daniel) 05/23/2018   S/P carpal tunnel and DeQuervains release right 07/14/17 07/20/2017   Carpal tunnel syndrome of right wrist    De Quervain's disease (tenosynovitis)    Essential hypertension 11/15/2013   Palpitations 06/28/2013   Mixed hyperlipidemia 06/28/2013   Obesity, unspecified 06/28/2013    Past Surgical History:  Procedure Laterality Date   APPENDECTOMY     ARTHROSCOPIC REPAIR ACL  05/2008   Left knee    CARPAL TUNNEL RELEASE Right 07/14/2017   Procedure: CARPAL TUNNEL RELEASE;  Surgeon: Carole Civil, MD;  Location: [facility];  Service: Orthopedics;  Laterality: Right;   CATARACT EXTRACTION, BILATERAL  2020   CHOLECYSTECTOMY     DILATION AND CURETTAGE OF UTERUS     DORSAL COMPARTMENT RELEASE Right 07/14/2017   Procedure: RELEASE DORSAL COMPARTMENT (DEQUERVAIN);  Surgeon: Carole Civil, MD;  Location: [facility];  Service: Orthopedics;  Laterality: Right;   HIATAL HERNIA REPAIR N/A 03/24/2020   Procedure: LAPAROSCOPIC REPAIR OF HIATAL HERNIA, nissen fundoplication;  Surgeon: Johnathan Hausen, MD;  Location: [facility];  Service: General;  Laterality: N/A;   HYSTEROSCOPY WITH D & C N/A 12/25/2013   Procedure: DILATATION AND CURETTAGE /HYSTEROSCOPY;  Surgeon: Gus Height, MD;  Location: [facility];  Service: Gynecology;  Laterality: N/A;   SHOULDER OPEN ROTATOR CUFF REPAIR Left 06/15/2018   Procedure: ROTATOR CUFF REPAIR SHOULDER OPEN WITH ACROMIOPLASTY;  Surgeon: Carole Civil, MD;  Location: [facility];  Service: Orthopedics;  Laterality: Left;   TUBAL LIGATION       OB History   No obstetric history on file.     Family History  Problem Relation Age of Onset   Heart failure Mother 20   Lung cancer Father 31   Breast cancer Sister    Diabetes Sister    Colon cancer Brother    Colon polyps Son  Colon polyps Daughter     Social History   Tobacco Use   Smoking status: Never   Smokeless tobacco: Never  Vaping Use   Vaping Use: Never used  Substance Use Topics   Alcohol use: Not Currently   Drug use: No    Home Medications Prior to Admission medications   Medication Sig Start Date End Date Taking? Authorizing Provider  apixaban (ELIQUIS) 5 MG TABS tablet Take 1 tablet (5 mg total) by mouth 2 (two) times daily. 05/12/21  Yes Milton Ferguson, MD  aspirin EC 81 MG tablet Take 81 mg by mouth daily. Swallow whole.   Yes [provider]  escitalopram (LEXAPRO) 20 MG tablet Take  20 mg by mouth at bedtime.  01/16/17  Yes [provider]  LORazepam (ATIVAN) 0.5 MG tablet Take 0.5 mg by mouth 2 (two) times daily as needed for anxiety.  12/04/19  Yes [provider]  metoprolol succinate (TOPROL-XL) 50 MG 24 hr tablet Take 50 mg by mouth daily. Take with or immediately following a meal.    Yes [provider]  pantoprazole (PROTONIX) 40 MG tablet Take 40 mg by mouth daily.    Yes [provider]  simvastatin (ZOCOR) 40 MG tablet Take 40 mg by mouth at bedtime.  08/17/18  Yes [provider]    Allergies    Hydrocodone, Avelox [moxifloxacin hcl in nacl], Codeine, and Oxybutynin  Review of Systems   Review of Systems  Constitutional:  Negative for appetite change and fatigue.  HENT:  Negative for congestion, ear discharge and sinus pressure.   Eyes:  Negative for discharge.  Respiratory:  Negative for cough.   Cardiovascular:  Negative for chest pain.  Gastrointestinal:  Negative for abdominal pain and diarrhea.  Genitourinary:  Negative for frequency and hematuria.  Musculoskeletal:  Negative for back pain.  Skin:  Negative for rash.  Neurological:  Negative for seizures and headaches.  Psychiatric/Behavioral:  Negative for hallucinations.    Physical Exam Updated Vital Signs BP 127/78   Pulse 74   Temp 98.9 F (37.2 C)   Resp 18   Wt 102.5 kg   SpO2 96%   BMI 41.34 kg/m   Physical Exam Vitals and nursing note reviewed.  Constitutional:      Appearance: She is well-developed.  HENT:     Head: Normocephalic.     Nose: Nose normal.  Eyes:     General: No scleral icterus.    Conjunctiva/sclera: Conjunctivae normal.  Neck:     Thyroid: No thyromegaly.  Cardiovascular:     Rate and Rhythm: Normal rate and regular rhythm.     Heart sounds: No murmur heard.   No friction rub. No gallop.  Pulmonary:     Breath sounds: No stridor. No wheezing or rales.  Chest:     Chest wall: No tenderness.  Abdominal:      General: There is no distension.     Tenderness: There is no abdominal tenderness. There is no rebound.  Musculoskeletal:        General: Normal range of motion.     Cervical back: Neck supple.  Lymphadenopathy:     Cervical: No cervical adenopathy.  Skin:    Findings: No erythema or rash.  Neurological:     Mental Status: She is alert and oriented to person, place, and time.     Motor: No abnormal muscle tone.     Coordination: Coordination normal.  Psychiatric:  Behavior: Behavior normal.    ED Results / Procedures / Treatments   Labs (all labs ordered are listed, but only abnormal results are displayed) Labs Reviewed  COMPREHENSIVE METABOLIC PANEL - Abnormal; Notable for the following components:      Result Value   Glucose, Bld 132 (*)    Total Bilirubin 0.2 (*)    All other components within normal limits  CBC - Abnormal; Notable for the following components:   Hemoglobin 15.3 (*)    HCT 47.0 (*)    All other components within normal limits  URINALYSIS, ROUTINE W REFLEX MICROSCOPIC - Abnormal; Notable for the following components:   Hgb urine dipstick MODERATE (*)    All other components within normal limits  URINALYSIS, MICROSCOPIC (REFLEX) - Abnormal; Notable for the following components:   Bacteria, UA RARE (*)    All other components within normal limits    EKG None  Radiology MR ANGIO HEAD WO CONTRAST  Result Date: 05/12/2021 CLINICAL DATA:  Provided history: Brain mass or lesion. Additional history provided: Altered mental status for 2-3 weeks recent diagnosis of UTI. Chest pain. Left arm swelling. EXAM: MRI HEAD WITHOUT CONTRAST MRA HEAD WITHOUT CONTRAST TECHNIQUE: Multiplanar, multi-echo pulse sequences of the brain and surrounding structures were acquired without intravenous contrast. Angiographic images of the Circle of Willis were acquired using MRA technique without intravenous contrast. COMPARISON:  Head CT 04/21/2021. FINDINGS: MRI HEAD FINDINGS  Brain: Mild generalized cerebral and cerebellar atrophy. Mild to moderate multifocal T2 FLAIR hyperintense signal abnormality within the cerebral white matter and pons, nonspecific but compatible with chronic small vessel ischemic disease. Small chronic infarct within the right cerebellar hemisphere. There is no acute infarct. No evidence of an intracranial mass. No chronic intracranial blood products. No extra-axial fluid collection. No midline shift. Vascular: Maintained flow voids within the proximal large arterial vessels. Skull and upper cervical spine: No focal suspicious marrow lesion. Incompletely assessed cervical spondylosis. Sinuses/Orbits: Visualized orbits show no acute finding. Bilateral lens replacements. No significant paranasal sinus disease at the imaged levels. MRA HEAD FINDINGS Anterior circulation: The intracranial internal carotid arteries are patent. Mild atherosclerotic irregularity of both vessels. The M1 middle cerebral arteries are patent. Atherosclerotic irregularity of the right M2 and more distal right MCA vessels. No M2 proximal branch occlusion or high-grade proximal stenosis is identified. The anterior cerebral arteries are patent. 1-2 mm inferiorly projecting vascular protrusion arising from the supraclinoid left ICA, which may reflect a small aneurysm or infundibulum (series 14, image 83). Posterior circulation: The non dominant right vertebral artery is patent. Atherosclerotic irregularity of this vessel. Most notably, there is a severe stenosis within this vessel just proximal to the vertebrobasilar junction. The dominant intracranial left vertebral artery is patent without stenosis. The basilar artery is patent. The posterior cerebral arteries are patent. Mild atherosclerotic irregularity of both vessels. Posterior communicating arteries are diminutive or absent, bilaterally. Anatomic variants: As described. IMPRESSION: MRI brain: 1. No evidence of acute intracranial  abnormality. 2. Mild-to-moderate chronic small vessel ischemic changes within the cerebral white matter. 3. Small chronic infarct within the right cerebellar hemisphere. 4. Mild generalized cerebral and cerebellar atrophy. MRA head: 1. No intracranial large vessel occlusion. 2. Intracranial atherosclerotic disease, as described. Most notably, there is a severe stenosis within the non-dominant right vertebral artery just proximal to the vertebrobasilar junction. 3. 1-2 mm inferiorly projecting vascular protrusion arising from the supraclinoid left ICA, which may reflect a small aneurysm or infundibulum. Electronically Signed   By: Kellie Simmering  D.O.   On: 05/12/2021 10:45   MR BRAIN WO CONTRAST  Result Date: 05/12/2021 CLINICAL DATA:  Provided history: Brain mass or lesion. Additional history provided: Altered mental status for 2-3 weeks recent diagnosis of UTI. Chest pain. Left arm swelling. EXAM: MRI HEAD WITHOUT CONTRAST MRA HEAD WITHOUT CONTRAST TECHNIQUE: Multiplanar, multi-echo pulse sequences of the brain and surrounding structures were acquired without intravenous contrast. Angiographic images of the Circle of Willis were acquired using MRA technique without intravenous contrast. COMPARISON:  Head CT 04/21/2021. FINDINGS: MRI HEAD FINDINGS Brain: Mild generalized cerebral and cerebellar atrophy. Mild to moderate multifocal T2 FLAIR hyperintense signal abnormality within the cerebral white matter and pons, nonspecific but compatible with chronic small vessel ischemic disease. Small chronic infarct within the right cerebellar hemisphere. There is no acute infarct. No evidence of an intracranial mass. No chronic intracranial blood products. No extra-axial fluid collection. No midline shift. Vascular: Maintained flow voids within the proximal large arterial vessels. Skull and upper cervical spine: No focal suspicious marrow lesion. Incompletely assessed cervical spondylosis. Sinuses/Orbits: Visualized orbits  show no acute finding. Bilateral lens replacements. No significant paranasal sinus disease at the imaged levels. MRA HEAD FINDINGS Anterior circulation: The intracranial internal carotid arteries are patent. Mild atherosclerotic irregularity of both vessels. The M1 middle cerebral arteries are patent. Atherosclerotic irregularity of the right M2 and more distal right MCA vessels. No M2 proximal branch occlusion or high-grade proximal stenosis is identified. The anterior cerebral arteries are patent. 1-2 mm inferiorly projecting vascular protrusion arising from the supraclinoid left ICA, which may reflect a small aneurysm or infundibulum (series 14, image 83). Posterior circulation: The non dominant right vertebral artery is patent. Atherosclerotic irregularity of this vessel. Most notably, there is a severe stenosis within this vessel just proximal to the vertebrobasilar junction. The dominant intracranial left vertebral artery is patent without stenosis. The basilar artery is patent. The posterior cerebral arteries are patent. Mild atherosclerotic irregularity of both vessels. Posterior communicating arteries are diminutive or absent, bilaterally. Anatomic variants: As described. IMPRESSION: MRI brain: 1. No evidence of acute intracranial abnormality. 2. Mild-to-moderate chronic small vessel ischemic changes within the cerebral white matter. 3. Small chronic infarct within the right cerebellar hemisphere. 4. Mild generalized cerebral and cerebellar atrophy. MRA head: 1. No intracranial large vessel occlusion. 2. Intracranial atherosclerotic disease, as described. Most notably, there is a severe stenosis within the non-dominant right vertebral artery just proximal to the vertebrobasilar junction. 3. 1-2 mm inferiorly projecting vascular protrusion arising from the supraclinoid left ICA, which may reflect a small aneurysm or infundibulum. Electronically Signed   By: Kellie Simmering D.O.   On: 05/12/2021 10:45   DG  Chest Port 1 View  Result Date: 05/12/2021 CLINICAL DATA:  Shortness of breath.  Altered mental status. EXAM: PORTABLE CHEST 1 VIEW COMPARISON:  04/21/2021 FINDINGS: Upper normal heart size. The lungs appear clear. No blunting of the costophrenic angles. Thoracic spondylosis. Suspected mitral valve calcification. IMPRESSION: 1. Upper normal heart size with suspected mitral valve calcification. 2. The lungs appear clear. 3. Thoracic spondylosis. Electronically Signed   By: Van Clines M.D.   On: 05/12/2021 08:23    Procedures Procedures   Medications Ordered in ED Medications - No data to display  ED Course  I have reviewed the triage vital signs and the nursing notes.  Pertinent labs & imaging results that were available during my care of the patient were reviewed by me and considered in my medical decision making (see chart for details). Patient  had 3 episodes of confusion over the last 6 weeks.  She had a short burst of atrial fibs in the emergency department.  MRI of the brain does not show any strokes.  I spoke with neurology Dr. Katherine Roan and he recommended starting the patient on Eliquis twice a day.  Also he suggested patient gets followed up with Dr. Merlene Laughter or neurology as an outpatient for possible EEG.  She is also referred to cardiology for the atrial fibs  CRITICAL CARE Performed by: Milton Ferguson Total critical care time: 45 minutes Critical care time was exclusive of separately billable procedures and treating other patients. Critical care was necessary to treat or prevent imminent or life-threatening deterioration. Critical care was time spent personally by me on the following activities: development of treatment plan with patient and/or surrogate as well as nursing, discussions with consultants, evaluation of patient's response to treatment, examination of patient, obtaining history from patient or surrogate, ordering and performing treatments and interventions,  ordering and review of laboratory studies, ordering and review of radiographic studies, pulse oximetry and re-evaluation of patient's condition.  MDM Rules/Calculators/A&P                          Altered mental status with minimal atrial for the  Final Clinical Impression(s) / ED Diagnoses Final diagnoses:  Confusion    Rx / DC Orders ED Discharge Orders          Ordered    apixaban (ELIQUIS) 5 MG TABS tablet  2 times daily        05/12/21 1412             Milton Ferguson, MD 05/12/21 1421

## 2021-06-25 ENCOUNTER — Ambulatory Visit (INDEPENDENT_AMBULATORY_CARE_PROVIDER_SITE_OTHER): Payer: Medicare Other | Admitting: Cardiology

## 2021-06-25 ENCOUNTER — Encounter: Payer: Self-pay | Admitting: Cardiology

## 2021-06-25 ENCOUNTER — Other Ambulatory Visit: Payer: Self-pay

## 2021-06-25 VITALS — BP 116/70 | HR 76 | Ht 62.0 in | Wt 229.6 lb

## 2021-06-25 DIAGNOSIS — R002 Palpitations: Secondary | ICD-10-CM

## 2021-06-25 DIAGNOSIS — D6869 Other thrombophilia: Secondary | ICD-10-CM | POA: Diagnosis not present

## 2021-06-25 DIAGNOSIS — I480 Paroxysmal atrial fibrillation: Secondary | ICD-10-CM | POA: Diagnosis not present

## 2021-06-25 MED ORDER — APIXABAN 5 MG PO TABS: 5.0000 mg | ORAL_TABLET | Freq: Two times a day (BID) | ORAL | 6 refills | Status: DC

## 2021-06-25 MED ORDER — APIXABAN 5 MG PO TABS: 5.0000 mg | ORAL_TABLET | Freq: Two times a day (BID) | ORAL | 0 refills | Status: DC

## 2021-06-25 MED ORDER — METOPROLOL SUCCINATE ER 50 MG PO TB24: 75.0000 mg | ORAL_TABLET | Freq: Every day | ORAL | 6 refills | Status: DC

## 2021-06-25 NOTE — Progress Notes (Signed)
Clinical Summary Donna Barron is a 81 y.o.female seen as new patient for the following medical problems  1.Afib - new diagnosis during 05/12/21 ER visit with AMS - of note EKG 04/22/21 showed NSR, during 11/29 visit was in afib with RVR - can have palpitations at times.  - has been on toprol 50mg  daily at home for some time.     Past Medical History:  Diagnosis Date   Anxiety    Cataract    Colon polyps    Depression    Dysrhythmia    heart palipations   Gallstones    GERD (gastroesophageal reflux disease)    HTN (hypertension)    Hypercholesteremia    PONV (postoperative nausea and vomiting)      Allergies  Allergen Reactions   Hydrocodone     Lip swelling   Avelox [Moxifloxacin Hcl In Nacl] Other (See Comments)    insomnia   Codeine Swelling    Swelling of lips   Oxybutynin Palpitations and Other (See Comments)    Tongue felt coated      Current Outpatient Medications  Medication Sig Dispense Refill   apixaban (ELIQUIS) 5 MG TABS tablet Take 1 tablet (5 mg total) by mouth 2 (two) times daily. 60 tablet 1   aspirin EC 81 MG tablet Take 81 mg by mouth daily. Swallow whole.     escitalopram (LEXAPRO) 20 MG tablet Take 20 mg by mouth at bedtime.      LORazepam (ATIVAN) 0.5 MG tablet Take 0.5 mg by mouth 2 (two) times daily as needed for anxiety.      metoprolol succinate (TOPROL-XL) 50 MG 24 hr tablet Take 50 mg by mouth daily. Take with or immediately following a meal.      pantoprazole (PROTONIX) 40 MG tablet Take 40 mg by mouth daily.      simvastatin (ZOCOR) 40 MG tablet Take 40 mg by mouth at bedtime.      No current facility-administered medications for this visit.   Facility-Administered Medications Ordered in Other Visits  Medication Dose Route Frequency Provider Last Rate Last Admin   fentaNYL (SUBLIMAZE) injection 25-50 mcg  25-50 mcg Intravenous Q5 min PRN Josephine Igo, MD         Past Surgical History:  Procedure Laterality Date    APPENDECTOMY     ARTHROSCOPIC REPAIR ACL  05/2008   Left knee   CARPAL TUNNEL RELEASE Right 07/14/2017   Procedure: CARPAL TUNNEL RELEASE;  Surgeon: Carole Civil, MD;  Location: [facility];  Service: Orthopedics;  Laterality: Right;   CATARACT EXTRACTION, BILATERAL  2020   CHOLECYSTECTOMY     DILATION AND CURETTAGE OF UTERUS     DORSAL COMPARTMENT RELEASE Right 07/14/2017   Procedure: RELEASE DORSAL COMPARTMENT (DEQUERVAIN);  Surgeon: Carole Civil, MD;  Location: [facility];  Service: Orthopedics;  Laterality: Right;   HIATAL HERNIA REPAIR N/A 03/24/2020   Procedure: LAPAROSCOPIC REPAIR OF HIATAL HERNIA, nissen fundoplication;  Surgeon: Johnathan Hausen, MD;  Location: [facility];  Service: General;  Laterality: N/A;   HYSTEROSCOPY WITH D & C N/A 12/25/2013   Procedure: DILATATION AND CURETTAGE /HYSTEROSCOPY;  Surgeon: Gus Height, MD;  Location: [facility];  Service: Gynecology;  Laterality: N/A;   SHOULDER OPEN ROTATOR CUFF REPAIR Left 06/15/2018   Procedure: ROTATOR CUFF REPAIR SHOULDER OPEN WITH ACROMIOPLASTY;  Surgeon: Carole Civil, MD;  Location: [facility];  Service: Orthopedics;  Laterality: Left;   TUBAL LIGATION       Allergies  Allergen Reactions   Hydrocodone     Lip swelling   Avelox [Moxifloxacin Hcl In Nacl] Other (See Comments)    insomnia   Codeine Swelling    Swelling of lips   Oxybutynin Palpitations and Other (See Comments)    Tongue felt coated       Family History  Problem Relation Age of Onset   Heart failure Mother 67   Lung cancer Father 61   Breast cancer Sister    Diabetes Sister    Colon cancer Brother    Colon polyps Son    Colon polyps Daughter      Social History Donna Barron reports that she has never smoked. She has never used smokeless tobacco. Donna Barron reports that she does not currently use alcohol.   Review of Systems CONSTITUTIONAL: No weight loss, fever, chills, weakness or fatigue.  HEENT: Eyes: No visual loss, blurred vision,  double vision or yellow sclerae.No hearing loss, sneezing, congestion, runny nose or sore throat.  SKIN: No rash or itching.  CARDIOVASCULAR: per hpi RESPIRATORY: No shortness of breath, cough or sputum.  GASTROINTESTINAL: No anorexia, nausea, vomiting or diarrhea. No abdominal pain or blood.  GENITOURINARY: No burning on urination, no polyuria NEUROLOGICAL: No headache, dizziness, syncope, paralysis, ataxia, numbness or tingling in the extremities. No change in bowel or bladder control.  MUSCULOSKELETAL: No muscle, back pain, joint pain or stiffness.  LYMPHATICS: No enlarged nodes. No history of splenectomy.  PSYCHIATRIC: No history of depression or anxiety.  ENDOCRINOLOGIC: No reports of sweating, cold or heat intolerance. No polyuria or polydipsia.  Marland Kitchen   Physical Examination Today's Vitals   06/25/21 1035  BP: 116/70  Pulse: 76  SpO2: 96%  Weight: 229 lb 9.6 oz (104.1 kg)  Height: 5\' 2"  (1.575 m)   Body mass index is 41.99 kg/m.  Gen: resting comfortably, no acute distress HEENT: no scleral icterus, pupils equal round and reactive, no palptable cervical adenopathy,  CV:RRR, no mr/g, no jvd Resp: Clear to auscultation bilaterally GI: abdomen is soft, non-tender, non-distended, normal bowel sounds, no hepatosplenomegaly MSK: extremities are warm, no edema.  Skin: warm, no rash Neuro:  no focal deficits Psych: appropriate affect     Assessment and Plan  1.PAF/Acquired thrombophilia - episode of afib noted during recent ER visit. EKG today shows she is back in SR - some palpitations at times, increase toprol to 75mg  daily -CHADS2vasc score is 3 (age x 2, gender), continue eliquis 5mg  bid and stop ASA - obtain echo   F/u 4 months   Arnoldo Lenis, M.D.

## 2021-06-25 NOTE — Patient Instructions (Signed)
Medication Instructions:  Increase Toprol XL to 75mg  daily  Stop Aspirin  Continue all other medications.     Labwork: none  Testing/Procedures: Your physician has requested that you have an echocardiogram. Echocardiography is a painless test that uses sound waves to create images of your heart. It provides your doctor with information about the size and shape of your heart and how well your hearts chambers and valves are working. This procedure takes approximately one hour. There are no restrictions for this procedure. Office will contact with results via phone or letter.     Follow-Up: 4 months   Any Other Special Instructions Will Be Listed Below (If Applicable).   If you need a refill on your cardiac medications before your next appointment, please call your pharmacy.

## 2021-07-23 ENCOUNTER — Ambulatory Visit (INDEPENDENT_AMBULATORY_CARE_PROVIDER_SITE_OTHER): Payer: Medicare Other

## 2021-07-23 DIAGNOSIS — I480 Paroxysmal atrial fibrillation: Secondary | ICD-10-CM

## 2021-07-23 LAB — ECHOCARDIOGRAM COMPLETE
AR max vel: 2.29 cm2
AV Area VTI: 1.87 cm2
AV Area mean vel: 1.88 cm2
AV Mean grad: 4 mmHg
AV Peak grad: 8 mmHg
Ao pk vel: 1.41 m/s
Area-P 1/2: 1.95 cm2
MV VTI: 1.94 cm2
S' Lateral: 2.56 cm
Single Plane A4C EF: 65 %

## 2021-07-27 ENCOUNTER — Telehealth: Payer: Self-pay | Admitting: *Deleted

## 2021-07-27 ENCOUNTER — Encounter: Payer: Self-pay | Admitting: *Deleted

## 2021-07-27 NOTE — Telephone Encounter (Signed)
Laurine Blazer, LPN  07/20/154  1:53 PM EST Back to Top    Notified via my chart.  Copy to pcp.

## 2021-07-27 NOTE — Telephone Encounter (Signed)
-----   Message from Arnoldo Lenis, MD sent at 07/26/2021 10:12 AM EST ----- NOrmal echo  Zandra Abts MD

## 2021-09-24 DIAGNOSIS — R413 Other amnesia: Secondary | ICD-10-CM | POA: Insufficient documentation

## 2021-09-24 DIAGNOSIS — F0390 Unspecified dementia without behavioral disturbance: Secondary | ICD-10-CM | POA: Insufficient documentation

## 2021-10-22 ENCOUNTER — Encounter: Payer: Self-pay | Admitting: Cardiology

## 2021-10-22 ENCOUNTER — Ambulatory Visit (INDEPENDENT_AMBULATORY_CARE_PROVIDER_SITE_OTHER): Payer: Medicare Other | Admitting: Cardiology

## 2021-10-22 VITALS — BP 116/64 | HR 80 | Ht 62.0 in | Wt 228.0 lb

## 2021-10-22 DIAGNOSIS — I480 Paroxysmal atrial fibrillation: Secondary | ICD-10-CM | POA: Diagnosis not present

## 2021-10-22 DIAGNOSIS — D6869 Other thrombophilia: Secondary | ICD-10-CM | POA: Diagnosis not present

## 2021-10-22 MED ORDER — APIXABAN 5 MG PO TABS: 5.0000 mg | ORAL_TABLET | Freq: Two times a day (BID) | ORAL | 1 refills | Status: DC

## 2021-10-22 MED ORDER — METOPROLOL SUCCINATE ER 50 MG PO TB24: 75.0000 mg | ORAL_TABLET | Freq: Every day | ORAL | 1 refills | Status: DC

## 2021-10-22 NOTE — Patient Instructions (Signed)

## 2021-10-22 NOTE — Progress Notes (Signed)
? ? ? ?Clinical Summary ?Ms. Ancona is a 81 y.o.female seen today for follow up of the following medical problems.  ? ?1.Afib ?- new diagnosis during 05/12/21 ER visit with AMS ?- of note EKG 04/22/21 showed NSR, during 11/29 visit was in afib with RVR ? ?- no recent palpitations ?- no bleeding on eliquis ? ? ? ? ?Past Medical History:  ?Diagnosis Date  ? Anxiety   ? Cataract   ? Colon polyps   ? Depression   ? Dysrhythmia   ? heart palipations  ? Gallstones   ? GERD (gastroesophageal reflux disease)   ? HTN (hypertension)   ? Hypercholesteremia   ? PONV (postoperative nausea and vomiting)   ? ? ? ?Allergies  ?Allergen Reactions  ? Hydrocodone   ?  Lip swelling  ? Avelox [Moxifloxacin Hcl In Nacl] Other (See Comments)  ?  insomnia  ? Codeine Swelling  ?  Swelling of lips  ? Oxybutynin Palpitations and Other (See Comments)  ?  Tongue felt coated   ? ? ? ?Current Outpatient Medications  ?Medication Sig Dispense Refill  ? apixaban (ELIQUIS) 5 MG TABS tablet Take 1 tablet (5 mg total) by mouth 2 (two) times daily. 60 tablet 6  ? escitalopram (LEXAPRO) 20 MG tablet Take 20 mg by mouth at bedtime.     ? LORazepam (ATIVAN) 0.5 MG tablet Take 0.5 mg by mouth 2 (two) times daily as needed for anxiety.     ? metoprolol succinate (TOPROL-XL) 50 MG 24 hr tablet Take 1.5 tablets (75 mg total) by mouth daily. 45 tablet 6  ? pantoprazole (PROTONIX) 40 MG tablet Take 40 mg by mouth daily.     ? simvastatin (ZOCOR) 40 MG tablet Take 40 mg by mouth at bedtime.     ? solifenacin (VESICARE) 5 MG tablet Take 5 mg by mouth daily.    ? Vitamin D, Ergocalciferol, (DRISDOL) 1.25 MG (50000 UNIT) CAPS capsule Take 50,000 Units by mouth once a week.    ? ?No current facility-administered medications for this visit.  ? ?Facility-Administered Medications Ordered in Other Visits  ?Medication Dose Route Frequency Provider Last Rate Last Admin  ? fentaNYL (SUBLIMAZE) injection 25-50 mcg  25-50 mcg Intravenous Q5 min PRN Josephine Igo, MD       ? ? ? ?Past Surgical History:  ?Procedure Laterality Date  ? APPENDECTOMY    ? ARTHROSCOPIC REPAIR ACL  05/2008  ? Left knee  ? CARPAL TUNNEL RELEASE Right 07/14/2017  ? Procedure: CARPAL TUNNEL RELEASE;  Surgeon: Carole Civil, MD;  Location: [facility];  Service: Orthopedics;  Laterality: Right;  ? CATARACT EXTRACTION, BILATERAL  2020  ? CHOLECYSTECTOMY    ? DILATION AND CURETTAGE OF UTERUS    ? DORSAL COMPARTMENT RELEASE Right 07/14/2017  ? Procedure: RELEASE DORSAL COMPARTMENT (DEQUERVAIN);  Surgeon: Carole Civil, MD;  Location: [facility];  Service: Orthopedics;  Laterality: Right;  ? HIATAL HERNIA REPAIR N/A 03/24/2020  ? Procedure: LAPAROSCOPIC REPAIR OF HIATAL HERNIA, nissen fundoplication;  Surgeon: Johnathan Hausen, MD;  Location: [facility];  Service: General;  Laterality: N/A;  ? HYSTEROSCOPY WITH D & C N/A 12/25/2013  ? Procedure: DILATATION AND CURETTAGE /HYSTEROSCOPY;  Surgeon: Gus Height, MD;  Location: [facility];  Service: Gynecology;  Laterality: N/A;  ? SHOULDER OPEN ROTATOR CUFF REPAIR Left 06/15/2018  ? Procedure: ROTATOR CUFF REPAIR SHOULDER OPEN WITH ACROMIOPLASTY;  Surgeon: Carole Civil, MD;  Location: [facility];  Service: Orthopedics;  Laterality: Left;  ?  TUBAL LIGATION    ? ? ? ?Allergies  ?Allergen Reactions  ? Hydrocodone   ?  Lip swelling  ? Avelox [Moxifloxacin Hcl In Nacl] Other (See Comments)  ?  insomnia  ? Codeine Swelling  ?  Swelling of lips  ? Oxybutynin Palpitations and Other (See Comments)  ?  Tongue felt coated   ? ? ? ? ?Family History  ?Problem Relation Age of Onset  ? Heart failure Mother 40  ? Lung cancer Father 17  ? Breast cancer Sister   ? Diabetes Sister   ? Colon cancer Brother   ? Colon polyps Son   ? Colon polyps Daughter   ? ? ? ?Social History ?Ms. Magloire reports that she has never smoked. She has never used smokeless tobacco. ?Ms. Vezina reports that she does not currently use alcohol. ? ? ?Review of Systems ?CONSTITUTIONAL: No weight loss, fever, chills, weakness  or fatigue.  ?HEENT: Eyes: No visual loss, blurred vision, double vision or yellow sclerae.No hearing loss, sneezing, congestion, runny nose or sore throat.  ?SKIN: No rash or itching.  ?CARDIOVASCULAR: per hpi ?RESPIRATORY: No shortness of breath, cough or sputum.  ?GASTROINTESTINAL: No anorexia, nausea, vomiting or diarrhea. No abdominal pain or blood.  ?GENITOURINARY: No burning on urination, no polyuria ?NEUROLOGICAL: No headache, dizziness, syncope, paralysis, ataxia, numbness or tingling in the extremities. No change in bowel or bladder control.  ?MUSCULOSKELETAL: No muscle, back pain, joint pain or stiffness.  ?LYMPHATICS: No enlarged nodes. No history of splenectomy.  ?PSYCHIATRIC: No history of depression or anxiety.  ?ENDOCRINOLOGIC: No reports of sweating, cold or heat intolerance. No polyuria or polydipsia.  ?. ? ? ?Physical Examination ?Today's Vitals  ? 10/22/21 1541  ?BP: 116/64  ?Pulse: 80  ?SpO2: 96%  ?Weight: 228 lb (103.4 kg)  ?Height: '5\' 2"'$  (1.575 m)  ? ?Body mass index is 41.7 kg/m?. ? ?Gen: resting comfortably, no acute distress ?HEENT: no scleral icterus, pupils equal round and reactive, no palptable cervical adenopathy,  ?CV: RRR, no m/r/g no jvd ?Resp: Clear to auscultation bilaterally ?GI: abdomen is soft, non-tender, non-distended, normal bowel sounds, no hepatosplenomegaly ?MSK: extremities are warm, no edema.  ?Skin: warm, no rash ?Neuro:  no focal deficits ?Psych: appropriate affect ? ? ?Diagnostic Studies ? ?07/2021 echo ?1. Left ventricular ejection fraction, by estimation, is 65 to 70%. The  ?left ventricle has normal function. The left ventricle has no regional  ?wall motion abnormalities. There is severe left ventricular hypertrophy.  ?Left ventricular diastolic parameters  ? are consistent with Grade I diastolic dysfunction (impaired relaxation).  ? 2. Right ventricular systolic function is normal. The right ventricular  ?size is normal. Tricuspid regurgitation signal is inadequate  for assessing  ?PA pressure.  ? 3. The mitral valve is abnormal. No evidence of mitral valve  ?regurgitation. No evidence of mitral stenosis. Moderate mitral annular  ?calcification.  ? 4. The aortic valve is tricuspid. Aortic valve regurgitation is not  ?visualized. No aortic stenosis is present.  ? 5. The inferior vena cava is normal in size with greater than 50%  ?respiratory variability, suggesting right atrial pressure of 3 mmHg.  ? ? ?Assessment and Plan  ? ?1.PAF/Acquired thrombophilia ?- no recent symptoms, doing well on toprol ?- continue eliquis for stroke prevention.  ? ? ? ? ?Arnoldo Lenis, M.D. ?

## 2022-01-28 ENCOUNTER — Ambulatory Visit (INDEPENDENT_AMBULATORY_CARE_PROVIDER_SITE_OTHER): Payer: Medicare Other

## 2022-01-28 ENCOUNTER — Ambulatory Visit (INDEPENDENT_AMBULATORY_CARE_PROVIDER_SITE_OTHER): Payer: Medicare Other | Admitting: Orthopedic Surgery

## 2022-01-28 VITALS — BP 139/69 | HR 77 | Ht 62.0 in | Wt 223.0 lb

## 2022-01-28 DIAGNOSIS — M25511 Pain in right shoulder: Secondary | ICD-10-CM | POA: Diagnosis not present

## 2022-01-28 DIAGNOSIS — M7541 Impingement syndrome of right shoulder: Secondary | ICD-10-CM | POA: Diagnosis not present

## 2022-01-28 MED ORDER — METHYLPREDNISOLONE ACETATE 40 MG/ML IJ SUSP
40.0000 mg | Freq: Once | INTRAMUSCULAR | Status: AC
Start: 2022-01-28 — End: 2022-01-28
  Administered 2022-01-28: 40 mg via INTRA_ARTICULAR

## 2022-01-28 NOTE — Patient Instructions (Signed)

## 2022-01-28 NOTE — Progress Notes (Signed)
Chief Complaint  Patient presents with   Shoulder Pain    New problem//RT shoulder pain// pt fell out of bed DOI unknown-happened a few months ago    81 year old female fell out of bed 2 months ago complains of pain right shoulder  Pain is located on the lateral anterior aspects of her right shoulder it is associated somewhat with forward elevation and abduction of the right arm  Examination reveals tenderness around the posterior subacromial area lateral deltoid and anterior portion of the shoulder  She has no weakness to manual muscle testing she has normal internal/external rotation passively she does have a positive impingement sign  Her x-ray was normal except for a type III acromion  Impression shoulder impingement syndrome right shoulder  Recommend home exercise with Codman's and subacromial injection  If unimproved please come back for further evaluation   Procedure note the subacromial injection shoulder RIGHT    Verbal consent was obtained to inject the  RIGHT   Shoulder  Timeout was completed to confirm the injection site is a subacromial space of the  RIGHT  shoulder   Medication used Depo-Medrol 40 mg and lidocaine 1% 3 cc  Anesthesia was provided by ethyl chloride  The injection was performed in the RIGHT  posterior subacromial space. After pinning the skin with alcohol and anesthetized the skin with ethyl chloride the subacromial space was injected using a 20-gauge needle. There were no complications  Sterile dressing was applied.

## 2022-05-11 ENCOUNTER — Ambulatory Visit: Payer: Medicare Other | Attending: Cardiology | Admitting: Cardiology

## 2022-05-11 ENCOUNTER — Encounter: Payer: Self-pay | Admitting: Cardiology

## 2022-05-11 VITALS — BP 145/74 | HR 79 | Ht 62.0 in | Wt 228.0 lb

## 2022-05-11 DIAGNOSIS — I480 Paroxysmal atrial fibrillation: Secondary | ICD-10-CM | POA: Diagnosis not present

## 2022-05-11 DIAGNOSIS — I10 Essential (primary) hypertension: Secondary | ICD-10-CM

## 2022-05-11 DIAGNOSIS — E782 Mixed hyperlipidemia: Secondary | ICD-10-CM

## 2022-05-11 NOTE — Progress Notes (Signed)
Virtual Visit via Telephone Note   Because of Donna Barron's co-morbid illnesses, she is at least at moderate risk for complications without adequate follow up.  This format is felt to be most appropriate for this patient at this time.  The patient did not have access to video technology/had technical difficulties with video requiring transitioning to audio format only (telephone).  All issues noted in this document were discussed and addressed.  No physical exam could be performed with this format.  Please refer to the patient's chart for her consent to telehealth for [facility].    Date:  05/11/2022   ID:  Donna Barron, DOB 23-Oct-1940, MRN [id number] The patient was identified using 2 identifiers.  Patient Location: Home Provider Location: Office/Clinic   PCP:  Jettie Booze, NP   [place] [facility] Cardiologist:  Carlyle Dolly, MD     Evaluation Performed:  Follow-Up Visit  Chief Complaint:  Follow up  History of Present Illness:    Donna Barron is a 81 y.o. female seen today for follow up of the following medical problems.    1.Afib - new diagnosis during 05/12/21 ER visit with AMS - of note EKG 04/22/21 showed NSR, during 11/29 visit was in afib with RVR   - denies any palpitations - compliant with meds - no bleeding on eliquis.   2. HTN 03/2022 118/62 -compliant with m,eds  3. Hyperlipidemia - followed by pcp   Past Medical History:  Diagnosis Date   Anxiety    Cataract    Colon polyps    Depression    Dysrhythmia    heart palipations   Gallstones    GERD (gastroesophageal reflux disease)    HTN (hypertension)    Hypercholesteremia    PONV (postoperative nausea and vomiting)    Past Surgical History:  Procedure Laterality Date   APPENDECTOMY     ARTHROSCOPIC REPAIR ACL  05/2008   Left knee   CARPAL TUNNEL RELEASE Right 07/14/2017   Procedure: CARPAL TUNNEL RELEASE;  Surgeon: Carole Civil, MD;  Location: [facility];  Service: Orthopedics;  Laterality: Right;   CATARACT EXTRACTION, BILATERAL  2020   CHOLECYSTECTOMY     DILATION AND CURETTAGE OF UTERUS     DORSAL COMPARTMENT RELEASE Right 07/14/2017   Procedure: RELEASE DORSAL COMPARTMENT (DEQUERVAIN);  Surgeon: Carole Civil, MD;  Location: [facility];  Service: Orthopedics;  Laterality: Right;   HIATAL HERNIA REPAIR N/A 03/24/2020   Procedure: LAPAROSCOPIC REPAIR OF HIATAL HERNIA, nissen fundoplication;  Surgeon: Johnathan Hausen, MD;  Location: [facility];  Service: General;  Laterality: N/A;   HYSTEROSCOPY WITH D & C N/A 12/25/2013   Procedure: DILATATION AND CURETTAGE /HYSTEROSCOPY;  Surgeon: Gus Height, MD;  Location: [facility];  Service: Gynecology;  Laterality: N/A;   SHOULDER OPEN ROTATOR CUFF REPAIR Left 06/15/2018   Procedure: ROTATOR CUFF REPAIR SHOULDER OPEN WITH ACROMIOPLASTY;  Surgeon: Carole Civil, MD;  Location: [facility];  Service: Orthopedics;  Laterality: Left;   TUBAL LIGATION       No outpatient medications have been marked as taking for the 05/11/22 encounter (Appointment) with Arnoldo Lenis, MD.     Allergies:   Hydrocodone, Avelox [moxifloxacin hcl in nacl], Codeine, and Oxybutynin   Social History   Tobacco Use   Smoking status: Never   Smokeless tobacco: Never  Vaping Use   Vaping Use: Never used  Substance Use Topics   Alcohol use: Not Currently   Drug  use: No     Family Hx: The patient's family history includes Breast cancer in her sister; Colon cancer in her brother; Colon polyps in her daughter and son; Diabetes in her sister; Heart failure (age of onset: 64) in her mother; Lung cancer (age of onset: 35) in her father.  ROS:   Please see the history of present illness.    All other systems reviewed and are negative.   Prior CV studies:   The following studies were reviewed today:  07/2021 echo 1. Left ventricular ejection fraction, by estimation, is 65 to 70%. The  left ventricle has normal function.  The left ventricle has no regional  wall motion abnormalities. There is severe left ventricular hypertrophy.  Left ventricular diastolic parameters   are consistent with Grade I diastolic dysfunction (impaired relaxation).   2. Right ventricular systolic function is normal. The right ventricular  size is normal. Tricuspid regurgitation signal is inadequate for assessing  PA pressure.   3. The mitral valve is abnormal. No evidence of mitral valve  regurgitation. No evidence of mitral stenosis. Moderate mitral annular  calcification.   4. The aortic valve is tricuspid. Aortic valve regurgitation is not  visualized. No aortic stenosis is present.   5. The inferior vena cava is normal in size with greater than 50%  respiratory variability, suggesting right atrial pressure of 3 mmHg.     Labs/Other Tests and Data Reviewed:    EKG:  No ECG reviewed.  Recent Labs: 05/12/2021: ALT 24; BUN 11; Creatinine, Ser 0.88; Hemoglobin 15.3; Platelets 290; Potassium 3.8; Sodium 135   Recent Lipid Panel No results found for: "CHOL", "TRIG", "HDL", "CHOLHDL", "LDLCALC", "LDLDIRECT"  Wt Readings from Last 3 Encounters:  01/28/22 223 lb (101.2 kg)  10/22/21 228 lb (103.4 kg)  06/25/21 229 lb 9.6 oz (104.1 kg)     Risk Assessment/Calculations:          Objective:    Vital Signs:   Today's Vitals   05/11/22 1342  BP: (!) 145/74  Pulse: 79  Weight: 228 lb (103.4 kg)  Height: '5\' 2"'$  (1.575 m)   Body mass index is 41.7 kg/m.   ASSESSMENT & PLAN:    1.PAF/Acquired thrombophilia - denies symptoms, has done well on toprol. We will continue - continue eliquis for stroke prevention  2. HTN - elevated by home measure today, at recent pcp visit was well controlled - monitor home bp's and take with her to pcp visit next month, continue current meds  3. Hyperlipidemia - f/u pcp labs           Time:   Today, I have spent 24 minutes with the patient with telehealth technology discussing  the above problems.     Medication Adjustments/Labs and Tests Ordered: Current medicines are reviewed at length with the patient today.  Concerns regarding medicines are outlined above.   Tests Ordered: No orders of the defined types were placed in this encounter.   Medication Changes: No orders of the defined types were placed in this encounter.   Follow Up:  In Person 6 months  Signed, Carlyle Dolly, MD  05/11/2022 12:30 PM    [place]

## 2022-05-11 NOTE — Patient Instructions (Signed)
Medication Instructions:  Continue all current medications.   Labwork: none  Testing/Procedures: none  Follow-Up: 6 months   Any Other Special Instructions Will Be Listed Below (If Applicable).   If you need a refill on your cardiac medications before your next appointment, please call your pharmacy.  

## 2022-08-12 ENCOUNTER — Encounter: Payer: Self-pay | Admitting: Radiology

## 2022-10-22 ENCOUNTER — Emergency Department (HOSPITAL_COMMUNITY): Payer: Medicare Other

## 2022-10-22 ENCOUNTER — Emergency Department (HOSPITAL_COMMUNITY)
Admission: EM | Admit: 2022-10-22 | Discharge: 2022-10-22 | Disposition: A | Discharge status: Home / Self Care | Payer: Medicare Other | Attending: Emergency Medicine | Admitting: Emergency Medicine

## 2022-10-22 ENCOUNTER — Encounter (HOSPITAL_COMMUNITY): Payer: Self-pay

## 2022-10-22 ENCOUNTER — Other Ambulatory Visit: Payer: Self-pay

## 2022-10-22 DIAGNOSIS — R569 Unspecified convulsions: Secondary | ICD-10-CM

## 2022-10-22 DIAGNOSIS — I10 Essential (primary) hypertension: Secondary | ICD-10-CM | POA: Insufficient documentation

## 2022-10-22 DIAGNOSIS — Z79899 Other long term (current) drug therapy: Secondary | ICD-10-CM | POA: Diagnosis not present

## 2022-10-22 DIAGNOSIS — R4182 Altered mental status, unspecified: Secondary | ICD-10-CM | POA: Diagnosis not present

## 2022-10-22 DIAGNOSIS — F0390 Unspecified dementia without behavioral disturbance: Secondary | ICD-10-CM | POA: Insufficient documentation

## 2022-10-22 DIAGNOSIS — Z7901 Long term (current) use of anticoagulants: Secondary | ICD-10-CM | POA: Diagnosis not present

## 2022-10-22 HISTORY — DX: Unspecified dementia, unspecified severity, without behavioral disturbance, psychotic disturbance, mood disturbance, and anxiety: F03.90

## 2022-10-22 LAB — CBC WITH DIFFERENTIAL/PLATELET
Abs Immature Granulocytes: 0.06 10*3/uL (ref 0.00–0.07)
Basophils Absolute: 0 10*3/uL (ref 0.0–0.1)
Basophils Relative: 0 %
Eosinophils Absolute: 0 10*3/uL (ref 0.0–0.5)
Eosinophils Relative: 0 %
HCT: 46.5 % — ABNORMAL HIGH (ref 36.0–46.0)
Hemoglobin: 14.8 g/dL (ref 12.0–15.0)
Immature Granulocytes: 1 %
Lymphocytes Relative: 10 %
Lymphs Abs: 1 10*3/uL (ref 0.7–4.0)
MCH: 27.3 pg (ref 26.0–34.0)
MCHC: 31.8 g/dL (ref 30.0–36.0)
MCV: 85.8 fL (ref 80.0–100.0)
Monocytes Absolute: 0.7 10*3/uL (ref 0.1–1.0)
Monocytes Relative: 7 %
Neutro Abs: 8.6 10*3/uL — ABNORMAL HIGH (ref 1.7–7.7)
Neutrophils Relative %: 82 %
Platelets: 327 10*3/uL (ref 150–400)
RBC: 5.42 MIL/uL — ABNORMAL HIGH (ref 3.87–5.11)
RDW: 15.6 % — ABNORMAL HIGH (ref 11.5–15.5)
WBC: 10.4 10*3/uL (ref 4.0–10.5)
nRBC: 0 % (ref 0.0–0.2)

## 2022-10-22 LAB — COMPREHENSIVE METABOLIC PANEL
ALT: 17 U/L (ref 0–44)
AST: 24 U/L (ref 15–41)
Albumin: 3.4 g/dL — ABNORMAL LOW (ref 3.5–5.0)
Alkaline Phosphatase: 76 U/L (ref 38–126)
Anion gap: 9 (ref 5–15)
BUN: 9 mg/dL (ref 8–23)
CO2: 27 mmol/L (ref 22–32)
Calcium: 9 mg/dL (ref 8.9–10.3)
Chloride: 100 mmol/L (ref 98–111)
Creatinine, Ser: 1 mg/dL (ref 0.44–1.00)
GFR, Estimated: 57 mL/min — ABNORMAL LOW (ref >60)
Glucose, Bld: 144 mg/dL — ABNORMAL HIGH (ref 70–99)
Potassium: 3.3 mmol/L — ABNORMAL LOW (ref 3.5–5.1)
Sodium: 136 mmol/L (ref 135–145)
Total Bilirubin: 0.8 mg/dL (ref 0.3–1.2)
Total Protein: 7.1 g/dL (ref 6.5–8.1)

## 2022-10-22 LAB — CBG MONITORING, ED: Glucose-Capillary: 178 mg/dL — ABNORMAL HIGH (ref 70–99)

## 2022-10-22 LAB — TROPONIN I (HIGH SENSITIVITY): Troponin I (High Sensitivity): 6 ng/L (ref <18)

## 2022-10-22 MED ORDER — SODIUM CHLORIDE 0.9 % IV BOLUS
500.0000 mL | Freq: Once | INTRAVENOUS | Status: AC
  Administered 2022-10-22: 500 mL via INTRAVENOUS

## 2022-10-22 NOTE — ED Triage Notes (Signed)
Family stated pt was sitting on couch and body started twitching and she would not answer any questions

## 2022-10-22 NOTE — ED Provider Notes (Signed)
[facility] AT [facility] Provider Note   CSN: [id number] Arrival date & time: 10/22/22  1955     History  Chief Complaint  Patient presents with   Altered Mental Status    Donna Barron is a 82 y.o. female.  Patient is an 82 year old female with past medical history of hypertension, mild dementia, and urinary incontinence for which she received Botox injection several days ago.  Patient brought by EMS after an apparent seizure.  Patient was with her daughter when she suddenly began to feel strangely, then lost consciousness and began shaking all over.  This lasted for several minutes, then resolved.  She was somewhat disoriented after the episode, but is currently back to baseline.  She denies any oral trauma during the episode and denies any bowel or bladder incontinence.  Patient has no prior history of seizures.  The history is provided by the patient.       Home Medications Prior to Admission medications   Medication Sig Start Date End Date Taking? Authorizing Provider  apixaban (ELIQUIS) 5 MG TABS tablet Take 1 tablet (5 mg total) by mouth 2 (two) times daily. 10/22/21   Antoine Poche, MD  escitalopram (LEXAPRO) 20 MG tablet Take 20 mg by mouth at bedtime.  01/16/17   [provider]  LORazepam (ATIVAN) 0.5 MG tablet Take 0.5 mg by mouth 2 (two) times daily as needed for anxiety. 12/04/19   [provider]  metoprolol succinate (TOPROL-XL) 50 MG 24 hr tablet Take 1.5 tablets (75 mg total) by mouth daily. 10/22/21 05/12/23  Antoine Poche, MD  MYRBETRIQ 50 MG TB24 tablet Take 50 mg by mouth daily. 10/14/21   [provider]  nystatin cream (MYCOSTATIN) Apply 1 Application topically 2 (two) times daily as needed. 12/17/21   [provider]  pantoprazole (PROTONIX) 40 MG tablet Take 40 mg by mouth daily.     [provider]  simvastatin (ZOCOR) 40 MG tablet Take 40 mg by mouth at bedtime.  08/17/18    [provider]  Vitamin D, Ergocalciferol, (DRISDOL) 1.25 MG (50000 UNIT) CAPS capsule Take 50,000 Units by mouth once a week. 06/10/21   [provider]      Allergies    Hydrocodone, Avelox [moxifloxacin hcl in nacl], Codeine, and Oxybutynin    Review of Systems   Review of Systems  All other systems reviewed and are negative.   Physical Exam Updated Vital Signs BP 107/63   Pulse 68   Temp 97.6 F (36.4 C) (Oral)   Resp 14   Ht 5\' 2"  (1.575 m)   Wt 102 kg   SpO2 95%   BMI 41.13 kg/m  Physical Exam Vitals and nursing note reviewed.  Constitutional:      General: She is not in acute distress.    Appearance: She is well-developed. She is not diaphoretic.  HENT:     Head: Normocephalic and atraumatic.  Eyes:     Extraocular Movements: Extraocular movements intact.     Pupils: Pupils are equal, round, and reactive to light.  Cardiovascular:     Rate and Rhythm: Normal rate and regular rhythm.     Heart sounds: No murmur heard.    No friction rub. No gallop.  Pulmonary:     Effort: Pulmonary effort is normal. No respiratory distress.     Breath sounds: Normal breath sounds. No wheezing.  Abdominal:     General: Bowel sounds are normal. There is  no distension.     Palpations: Abdomen is soft.     Tenderness: There is no abdominal tenderness.  Musculoskeletal:        General: Normal range of motion.     Cervical back: Normal range of motion and neck supple.  Skin:    General: Skin is warm and dry.  Neurological:     General: No focal deficit present.     Mental Status: She is alert and oriented to person, place, and time. Mental status is at baseline.     Cranial Nerves: No cranial nerve deficit.     Sensory: No sensory deficit.     Motor: No weakness.     Coordination: Coordination normal.     ED Results / Procedures / Treatments   Labs (all labs ordered are listed, but only abnormal results are displayed) Labs Reviewed  CBC WITH  DIFFERENTIAL/PLATELET - Abnormal; Notable for the following components:      Result Value   RBC 5.42 (*)    HCT 46.5 (*)    RDW 15.6 (*)    Neutro Abs 8.6 (*)    All other components within normal limits  COMPREHENSIVE METABOLIC PANEL - Abnormal; Notable for the following components:   Potassium 3.3 (*)    Glucose, Bld 144 (*)    Albumin 3.4 (*)    GFR, Estimated 57 (*)    All other components within normal limits  CBG MONITORING, ED - Abnormal; Notable for the following components:   Glucose-Capillary 178 (*)    All other components within normal limits  URINALYSIS, ROUTINE W REFLEX MICROSCOPIC  TROPONIN I (HIGH SENSITIVITY)    EKG None  Radiology CT Head Wo Contrast  Result Date: 10/22/2022 CLINICAL DATA:  Uncontrollable twitching. EXAM: CT HEAD WITHOUT CONTRAST TECHNIQUE: Contiguous axial images were obtained from the base of the skull through the vertex without intravenous contrast. RADIATION DOSE REDUCTION: This exam was performed according to the departmental dose-optimization program which includes automated exposure control, adjustment of the mA and/or kV according to patient size and/or use of iterative reconstruction technique. COMPARISON:  April 21, 2021 FINDINGS: Brain: There is mild to moderate severity cerebral atrophy with widening of the extra-axial spaces and ventricular dilatation. There are areas of decreased attenuation within the white matter tracts of the supratentorial brain, consistent with microvascular disease changes. Vascular: There is marked severity bilateral cavernous carotid artery calcification. Skull: Normal. Negative for fracture or focal lesion. Sinuses/Orbits: No acute finding. Other: None. IMPRESSION: 1. No acute intracranial abnormality. 2. Generalized cerebral atrophy and microvascular disease changes of the supratentorial brain. Electronically Signed   By: Aram Candela M.D.   On: 10/22/2022 22:58   DG Chest Port 1 View  Result Date:  10/22/2022 CLINICAL DATA:  Shortness of breath EXAM: PORTABLE CHEST 1 VIEW COMPARISON:  Chest x-ray 05/12/2021 FINDINGS: The heart size and mediastinal contours are within normal limits. Both lungs are clear. The visualized skeletal structures are unremarkable. IMPRESSION: No active disease. Electronically Signed   By: Darliss Cheney M.D.   On: 10/22/2022 22:56    Procedures Procedures    Medications Ordered in ED Medications  sodium chloride 0.9 % bolus 500 mL (500 mLs Intravenous New Bag/Given 10/22/22 2256)    ED Course/ Medical Decision Making/ A&P  Patient brought by EMS after an apparent seizure as described in the HPI.  Patient arrives here with stable vital signs and physical examination which is basically unremarkable.  Workup initiated including CBC, comprehensive metabolic panel, and  troponin, all of which are basically unremarkable.  Chest x-ray shows no acute process and CT scan of the head is unremarkable.  Patient is back at her baseline and feels fine.  She has no complaints.  I am uncertain as to the exact etiology of this episode, but from what the daughter describes it sounds like a seizure.  She has never had this before.  At this point, patient is back to baseline and I feel as though can be discharged.  Her workup is unremarkable.  I will have her follow-up with the primary doctor in the next week and return to the ER if symptoms worsen or change.  Final Clinical Impression(s) / ED Diagnoses Final diagnoses:  None    Rx / DC Orders ED Discharge Orders     None         Geoffery Lyons, MD 10/22/22 2340

## 2022-10-22 NOTE — ED Notes (Signed)
Awaiting to see provider   No seizure like activity noted

## 2022-10-22 NOTE — Discharge Instructions (Signed)
Continue medications as previously prescribed.  Follow-up with primary doctor in the next week, and return to the ER if you experience additional episodes.

## 2022-10-22 NOTE — ED Notes (Signed)
Pt aware UA is needed Family at bedside, and will ring when she needs to go.

## 2022-11-04 ENCOUNTER — Ambulatory Visit: Payer: Medicare Other | Attending: Cardiology | Admitting: Cardiology

## 2022-11-04 NOTE — Progress Notes (Deleted)
Clinical Summary Ms. Pressly is a 82 y.o.female seen today for follow up of the following medical problems.    1.Afib - new diagnosis during 05/12/21 ER visit with AMS - of note EKG 04/22/21 showed NSR, during 11/29 visit was in afib with RVR   - denies any palpitations - compliant with meds - no bleeding on eliquis.    2. HTN 03/2022 118/62 -compliant with m,eds   3. Hyperlipidemia - followed by pcp  4.Seizure activity -er visit 10/22/22  Past Medical History:  Diagnosis Date   Anxiety    Cataract    Colon polyps    Dementia (HCC)    Depression    Dysrhythmia    heart palipations   Gallstones    GERD (gastroesophageal reflux disease)    HTN (hypertension)    Hypercholesteremia    PONV (postoperative nausea and vomiting)      Allergies  Allergen Reactions   Hydrocodone     Lip swelling   Avelox [Moxifloxacin Hcl In Nacl] Other (See Comments)    insomnia   Codeine Swelling    Swelling of lips   Oxybutynin Palpitations and Other (See Comments)    Tongue felt coated      Current Outpatient Medications  Medication Sig Dispense Refill   apixaban (ELIQUIS) 5 MG TABS tablet Take 1 tablet (5 mg total) by mouth 2 (two) times daily. 180 tablet 1   escitalopram (LEXAPRO) 20 MG tablet Take 20 mg by mouth at bedtime.      LORazepam (ATIVAN) 0.5 MG tablet Take 0.5 mg by mouth 2 (two) times daily as needed for anxiety.     metoprolol succinate (TOPROL-XL) 50 MG 24 hr tablet Take 1.5 tablets (75 mg total) by mouth daily. 135 tablet 1   MYRBETRIQ 50 MG TB24 tablet Take 50 mg by mouth daily.     nystatin cream (MYCOSTATIN) Apply 1 Application topically 2 (two) times daily as needed.     pantoprazole (PROTONIX) 40 MG tablet Take 40 mg by mouth daily.      simvastatin (ZOCOR) 40 MG tablet Take 40 mg by mouth at bedtime.      Vitamin D, Ergocalciferol, (DRISDOL) 1.25 MG (50000 UNIT) CAPS capsule Take 50,000 Units by mouth once a week.     No current  facility-administered medications for this visit.   Facility-Administered Medications Ordered in Other Visits  Medication Dose Route Frequency Provider Last Rate Last Admin   fentaNYL (SUBLIMAZE) injection 25-50 mcg  25-50 mcg Intravenous Q5 min PRN Mal Amabile, MD         Past Surgical History:  Procedure Laterality Date   APPENDECTOMY     ARTHROSCOPIC REPAIR ACL  05/2008   Left knee   CARPAL TUNNEL RELEASE Right 07/14/2017   Procedure: CARPAL TUNNEL RELEASE;  Surgeon: Vickki Hearing, MD;  Location: [facility];  Service: Orthopedics;  Laterality: Right;   CATARACT EXTRACTION, BILATERAL  2020   CHOLECYSTECTOMY     DILATION AND CURETTAGE OF UTERUS     DORSAL COMPARTMENT RELEASE Right 07/14/2017   Procedure: RELEASE DORSAL COMPARTMENT (DEQUERVAIN);  Surgeon: Vickki Hearing, MD;  Location: [facility];  Service: Orthopedics;  Laterality: Right;   HIATAL HERNIA REPAIR N/A 03/24/2020   Procedure: LAPAROSCOPIC REPAIR OF HIATAL HERNIA, nissen fundoplication;  Surgeon: Luretha Murphy, MD;  Location: [facility];  Service: General;  Laterality: N/A;   HYSTEROSCOPY WITH D & C N/A 12/25/2013   Procedure: DILATATION AND CURETTAGE /HYSTEROSCOPY;  Surgeon: Isaias Cowman  Tenny Craw, MD;  Location: [facility];  Service: Gynecology;  Laterality: N/A;   SHOULDER OPEN ROTATOR CUFF REPAIR Left 06/15/2018   Procedure: ROTATOR CUFF REPAIR SHOULDER OPEN WITH ACROMIOPLASTY;  Surgeon: Vickki Hearing, MD;  Location: [facility];  Service: Orthopedics;  Laterality: Left;   TUBAL LIGATION       Allergies  Allergen Reactions   Hydrocodone     Lip swelling   Avelox [Moxifloxacin Hcl In Nacl] Other (See Comments)    insomnia   Codeine Swelling    Swelling of lips   Oxybutynin Palpitations and Other (See Comments)    Tongue felt coated       Family History  Problem Relation Age of Onset   Heart failure Mother 28   Lung cancer Father 60   Breast cancer Sister    Diabetes Sister    Colon cancer Brother    Colon polyps  Son    Colon polyps Daughter      Social History Ms. Collymore reports that she has never smoked. She has never used smokeless tobacco. Ms. Winand reports that she does not currently use alcohol.   Review of Systems CONSTITUTIONAL: No weight loss, fever, chills, weakness or fatigue.  HEENT: Eyes: No visual loss, blurred vision, double vision or yellow sclerae.No hearing loss, sneezing, congestion, runny nose or sore throat.  SKIN: No rash or itching.  CARDIOVASCULAR:  RESPIRATORY: No shortness of breath, cough or sputum.  GASTROINTESTINAL: No anorexia, nausea, vomiting or diarrhea. No abdominal pain or blood.  GENITOURINARY: No burning on urination, no polyuria NEUROLOGICAL: No headache, dizziness, syncope, paralysis, ataxia, numbness or tingling in the extremities. No change in bowel or bladder control.  MUSCULOSKELETAL: No muscle, back pain, joint pain or stiffness.  LYMPHATICS: No enlarged nodes. No history of splenectomy.  PSYCHIATRIC: No history of depression or anxiety.  ENDOCRINOLOGIC: No reports of sweating, cold or heat intolerance. No polyuria or polydipsia.  Marland Kitchen   Physical Examination There were no vitals filed for this visit. There were no vitals filed for this visit.  Gen: resting comfortably, no acute distress HEENT: no scleral icterus, pupils equal round and reactive, no palptable cervical adenopathy,  CV Resp: Clear to auscultation bilaterally GI: abdomen is soft, non-tender, non-distended, normal bowel sounds, no hepatosplenomegaly MSK: extremities are warm, no edema.  Skin: warm, no rash Neuro:  no focal deficits Psych: appropriate affect   Diagnostic Studies  07/2021 echo 1. Left ventricular ejection fraction, by estimation, is 65 to 70%. The  left ventricle has normal function. The left ventricle has no regional  wall motion abnormalities. There is severe left ventricular hypertrophy.  Left ventricular diastolic parameters   are consistent with Grade I  diastolic dysfunction (impaired relaxation).   2. Right ventricular systolic function is normal. The right ventricular  size is normal. Tricuspid regurgitation signal is inadequate for assessing  PA pressure.   3. The mitral valve is abnormal. No evidence of mitral valve  regurgitation. No evidence of mitral stenosis. Moderate mitral annular  calcification.   4. The aortic valve is tricuspid. Aortic valve regurgitation is not  visualized. No aortic stenosis is present.   5. The inferior vena cava is normal in size with greater than 50%  respiratory variability, suggesting right atrial pressure of 3 mmHg.      Assessment and Plan  1.PAF/Acquired thrombophilia - denies symptoms, has done well on toprol. We will continue - continue eliquis for stroke prevention   2. HTN - elevated by home measure today,  at recent pcp visit was well controlled - monitor home bp's and take with her to pcp visit next month, continue current meds   3. Hyperlipidemia - f/u pcp labs      Antoine Poche, M.D.

## 2022-11-07 ENCOUNTER — Other Ambulatory Visit: Payer: Self-pay | Admitting: Cardiology

## 2022-11-09 NOTE — Telephone Encounter (Signed)
Prescription refill request for Eliquis received. Indication: PAF Last office visit: 05/11/22  Dominga Ferry MD Scr: 1.00 on 10/22/22  Epic Age: 82 Weight: 103.4kg  Based on above findings Eliquis 5mg  twice daily is the appropriate dose.  Refill approved.

## 2023-01-13 ENCOUNTER — Encounter: Payer: Self-pay | Admitting: Neurology

## 2023-02-23 ENCOUNTER — Ambulatory Visit: Payer: Medicare Other | Admitting: Neurology

## 2023-02-24 ENCOUNTER — Ambulatory Visit: Payer: Medicare Other | Admitting: Neurology

## 2023-03-04 ENCOUNTER — Emergency Department (HOSPITAL_COMMUNITY)
Admission: EM | Admit: 2023-03-04 | Discharge: 2023-03-05 | Disposition: A | Discharge status: Home / Self Care | Payer: Medicare Other | Attending: Emergency Medicine | Admitting: Emergency Medicine

## 2023-03-04 ENCOUNTER — Encounter (HOSPITAL_COMMUNITY): Payer: Self-pay

## 2023-03-04 DIAGNOSIS — Z7901 Long term (current) use of anticoagulants: Secondary | ICD-10-CM | POA: Diagnosis not present

## 2023-03-04 DIAGNOSIS — R569 Unspecified convulsions: Secondary | ICD-10-CM | POA: Diagnosis present

## 2023-03-04 DIAGNOSIS — Z79899 Other long term (current) drug therapy: Secondary | ICD-10-CM | POA: Insufficient documentation

## 2023-03-04 DIAGNOSIS — I10 Essential (primary) hypertension: Secondary | ICD-10-CM | POA: Diagnosis not present

## 2023-03-04 NOTE — ED Triage Notes (Signed)
Pt's family called EMS due to pt "being more altered than normal". Pt's family stated that she is in the early stages of dementia, but she seems more altered than her normal, depending on the day. Pt has also been seeing neurology for seizures of an unknown cause since May and has not had her Ativan today.

## 2023-03-05 ENCOUNTER — Emergency Department (HOSPITAL_COMMUNITY): Payer: Medicare Other

## 2023-03-05 DIAGNOSIS — R569 Unspecified convulsions: Secondary | ICD-10-CM | POA: Diagnosis not present

## 2023-03-05 LAB — HEPATIC FUNCTION PANEL
ALT: 13 U/L (ref 0–44)
AST: 18 U/L (ref 15–41)
Albumin: 3.1 g/dL — ABNORMAL LOW (ref 3.5–5.0)
Alkaline Phosphatase: 56 U/L (ref 38–126)
Bilirubin, Direct: 0.2 mg/dL (ref 0.0–0.2)
Indirect Bilirubin: 0.9 mg/dL (ref 0.3–0.9)
Total Bilirubin: 1.1 mg/dL (ref 0.3–1.2)
Total Protein: 6.1 g/dL — ABNORMAL LOW (ref 6.5–8.1)

## 2023-03-05 LAB — CBC WITH DIFFERENTIAL/PLATELET
Abs Immature Granulocytes: 0.02 10*3/uL (ref 0.00–0.07)
Basophils Absolute: 0 10*3/uL (ref 0.0–0.1)
Basophils Relative: 0 %
Eosinophils Absolute: 0 10*3/uL (ref 0.0–0.5)
Eosinophils Relative: 0 %
HCT: 45.3 % (ref 36.0–46.0)
Hemoglobin: 14.4 g/dL (ref 12.0–15.0)
Immature Granulocytes: 0 %
Lymphocytes Relative: 28 %
Lymphs Abs: 2.6 10*3/uL (ref 0.7–4.0)
MCH: 27.4 pg (ref 26.0–34.0)
MCHC: 31.8 g/dL (ref 30.0–36.0)
MCV: 86.1 fL (ref 80.0–100.0)
Monocytes Absolute: 0.8 10*3/uL (ref 0.1–1.0)
Monocytes Relative: 8 %
Neutro Abs: 5.9 10*3/uL (ref 1.7–7.7)
Neutrophils Relative %: 64 %
Platelets: 340 10*3/uL (ref 150–400)
RBC: 5.26 MIL/uL — ABNORMAL HIGH (ref 3.87–5.11)
RDW: 16.1 % — ABNORMAL HIGH (ref 11.5–15.5)
WBC: 9.4 10*3/uL (ref 4.0–10.5)
nRBC: 0 % (ref 0.0–0.2)

## 2023-03-05 LAB — URINALYSIS, ROUTINE W REFLEX MICROSCOPIC
Bilirubin Urine: NEGATIVE
Glucose, UA: NEGATIVE mg/dL
Ketones, ur: 5 mg/dL — AB
Nitrite: NEGATIVE
Protein, ur: NEGATIVE mg/dL
Specific Gravity, Urine: 1.004 — ABNORMAL LOW (ref 1.005–1.030)
pH: 6 (ref 5.0–8.0)

## 2023-03-05 LAB — BASIC METABOLIC PANEL
Anion gap: 9 (ref 5–15)
BUN: 7 mg/dL — ABNORMAL LOW (ref 8–23)
CO2: 27 mmol/L (ref 22–32)
Calcium: 9.3 mg/dL (ref 8.9–10.3)
Chloride: 101 mmol/L (ref 98–111)
Creatinine, Ser: 0.87 mg/dL (ref 0.44–1.00)
GFR, Estimated: >60 mL/min (ref >60)
Glucose, Bld: 100 mg/dL — ABNORMAL HIGH (ref 70–99)
Potassium: 3.1 mmol/L — ABNORMAL LOW (ref 3.5–5.1)
Sodium: 137 mmol/L (ref 135–145)

## 2023-03-05 LAB — MAGNESIUM: Magnesium: 1.8 mg/dL (ref 1.7–2.4)

## 2023-03-05 LAB — AMMONIA: Ammonia: <10 umol/L (ref 9–35)

## 2023-03-05 MED ORDER — VALPROATE SODIUM 100 MG/ML IV SOLN
2000.0000 mg | Freq: Once | INTRAVENOUS | Status: AC
  Administered 2023-03-05: 2000 mg via INTRAVENOUS
  Filled 2023-03-05: qty 20

## 2023-03-05 MED ORDER — SODIUM CHLORIDE 0.9 % IV BOLUS
1000.0000 mL | Freq: Once | INTRAVENOUS | Status: AC
  Administered 2023-03-05: 1000 mL via INTRAVENOUS

## 2023-03-05 MED ORDER — DIVALPROEX SODIUM 500 MG PO DR TAB: 500.0000 mg | DELAYED_RELEASE_TABLET | Freq: Three times a day (TID) | ORAL | 1 refills | Status: DC

## 2023-03-05 NOTE — ED Provider Notes (Signed)
[place] EMERGENCY DEPARTMENT AT [facility] Provider Note   CSN: [id number] Arrival date & time: 03/04/23  2218     History  Chief Complaint  Patient presents with   Altered Mental Status    Donna Barron is a 82 y.o. female.  Patient is an 82 year old female with past medical history of hyperlipidemia, hypertension, neurocognitive disorder.  Patient is brought by son for evaluation of seizures/altered mental status.  On 2 occasions today, patient has had episodes where she began staring, became disoriented, then developed seizure.  Daughter has 1 of these episodes recorded on her cell phone and shows generalized tonic-clonic shaking for 20 to 30 seconds followed by confusion and disorientation.  She has had 7 or 8 of these episodes in the past 5 months.  They have been seen here on 1 prior occasion and had been given Ativan, but this does not seem to be helping.  They have an appointment with neurology in 1 month.  She is not on any antiepileptic medications.  Patient and daughter deny any drug or alcohol use.  The history is provided by the patient.       Home Medications Prior to Admission medications   Medication Sig Start Date End Date Taking? Authorizing Provider  apixaban (ELIQUIS) 5 MG TABS tablet Take 1 tablet by mouth twice daily 11/09/22   Antoine Poche, MD  escitalopram (LEXAPRO) 20 MG tablet Take 20 mg by mouth at bedtime.  01/16/17   [provider]  LORazepam (ATIVAN) 0.5 MG tablet Take 0.5 mg by mouth 2 (two) times daily as needed for anxiety. 12/04/19   [provider]  metoprolol succinate (TOPROL-XL) 50 MG 24 hr tablet Take 1.5 tablets (75 mg total) by mouth daily. 10/22/21 05/12/23  Antoine Poche, MD  MYRBETRIQ 50 MG TB24 tablet Take 50 mg by mouth daily. 10/14/21   [provider]  nystatin cream (MYCOSTATIN) Apply 1 Application topically 2 (two) times daily as needed. 12/17/21   [provider]  pantoprazole  (PROTONIX) 40 MG tablet Take 40 mg by mouth daily.     [provider]  simvastatin (ZOCOR) 40 MG tablet Take 40 mg by mouth at bedtime.  08/17/18   [provider]  Vitamin D, Ergocalciferol, (DRISDOL) 1.25 MG (50000 UNIT) CAPS capsule Take 50,000 Units by mouth once a week. 06/10/21   [provider]      Allergies    Hydrocodone, Avelox [moxifloxacin hcl in nacl], Codeine, and Oxybutynin    Review of Systems   Review of Systems  All other systems reviewed and are negative.   Physical Exam Updated Vital Signs BP 119/73   Pulse 61   Temp 98.2 F (36.8 C) (Oral)   Resp 20   Ht 5\' 2"  (1.575 m)   Wt 97.5 kg   SpO2 92%   BMI 39.32 kg/m  Physical Exam Vitals and nursing note reviewed.  Constitutional:      General: She is not in acute distress.    Appearance: She is well-developed. She is not diaphoretic.  HENT:     Head: Normocephalic and atraumatic.  Eyes:     Extraocular Movements: Extraocular movements intact.     Pupils: Pupils are equal, round, and reactive to light.  Cardiovascular:     Rate and Rhythm: Normal rate and regular rhythm.     Heart sounds: No murmur heard.    No friction rub. No gallop.  Pulmonary:     Effort:  Pulmonary effort is normal. No respiratory distress.     Breath sounds: Normal breath sounds. No wheezing.  Abdominal:     General: Bowel sounds are normal. There is no distension.     Palpations: Abdomen is soft.     Tenderness: There is no abdominal tenderness.  Musculoskeletal:        General: Normal range of motion.     Cervical back: Normal range of motion and neck supple.  Skin:    General: Skin is warm and dry.  Neurological:     General: No focal deficit present.     Mental Status: She is alert and oriented to person, place, and time.     Cranial Nerves: No cranial nerve deficit.     Motor: No weakness.     ED Results / Procedures / Treatments   Labs (all labs ordered are listed, but only abnormal  results are displayed) Labs Reviewed  BASIC METABOLIC PANEL  CBC WITH DIFFERENTIAL/PLATELET  URINALYSIS, ROUTINE W REFLEX MICROSCOPIC    EKG EKG Interpretation Date/Time:  Friday March 04 2023 22:29:05 EDT Ventricular Rate:  68 PR Interval:  148 QRS Duration:  87 QT Interval:  419 QTC Calculation: 446 R Axis:   31  Text Interpretation: Sinus arrhythmia Abnormal R-wave progression, early transition Confirmed by Geoffery Lyons ([id number]) on 03/05/2023 12:24:55 AM  Radiology No results found.  Procedures Procedures    Medications Ordered in ED Medications  sodium chloride 0.9 % bolus 1,000 mL (has no administration in time range)    ED Course/ Medical Decision Making/ A&P  This patient is an 82 year old female presenting for evaluation of seizures.  She has past medical history and recent seizure disorder as described in the HPI.  Patient arrives here with stable vital signs and is afebrile.  She is neurologically intact and is clinically well-appearing.  Workup initiated including CBC, basic metabolic panel, both of which are unremarkable.  LFTs are also basically normal and ammonia level is not elevated.  Urinalysis inconsistent with infection.  CT scan of the head obtained showing no acute intracranial process, just aging brain.  Due to the patient's recurrent seizure activity and 2 seizures today alone, I did discuss care with neurology.  I spoke with Dr. Otelia Limes who has recommended starting Depakote.  Patient will be given a bolus of Depakote along with a 3 times daily oral dose.  Patient is to follow-up with neurology.  She has a follow-up appointment in 1 month with Friend.  Final Clinical Impression(s) / ED Diagnoses Final diagnoses:  None    Rx / DC Orders ED Discharge Orders     None         Geoffery Lyons, MD 03/05/23 [phone]

## 2023-03-05 NOTE — Discharge Instructions (Signed)
Begin taking Depakote as prescribed.  Follow-up with neurology as previously scheduled.  A referral will also be placed to [facility] neurology in the possibility that they may get you in sooner.  Return to the ER for any new and/or concerning issues.

## 2023-04-07 ENCOUNTER — Encounter: Payer: Self-pay | Admitting: Neurology

## 2023-04-07 ENCOUNTER — Ambulatory Visit (INDEPENDENT_AMBULATORY_CARE_PROVIDER_SITE_OTHER): Payer: Medicare Other | Admitting: Neurology

## 2023-04-07 VITALS — BP 130/85 | HR 76 | Ht 62.0 in | Wt 212.2 lb

## 2023-04-07 DIAGNOSIS — F0390 Unspecified dementia without behavioral disturbance: Secondary | ICD-10-CM

## 2023-04-07 DIAGNOSIS — G40209 Localization-related (focal) (partial) symptomatic epilepsy and epileptic syndromes with complex partial seizures, not intractable, without status epilepticus: Secondary | ICD-10-CM | POA: Diagnosis not present

## 2023-04-07 MED ORDER — DIVALPROEX SODIUM ER 500 MG PO TB24
ORAL_TABLET | ORAL | 11 refills | Status: DC
Start: 2023-04-07 — End: 2023-05-06

## 2023-04-07 MED ORDER — VALTOCO 20 MG DOSE 10 MG/0.1ML NA LQPK: NASAL | 5 refills | Status: DC

## 2023-04-07 NOTE — Progress Notes (Signed)
NEUROLOGY CONSULTATION NOTE  Donna Barron MRN: [id number] DOB: 06/25/1940  Referring provider: Kathlen Brunswick, NP Primary care provider: Kathlen Brunswick, NP  Reason for consult:  seizures, dementia   Thank you for your kind referral of Donna Barron for consultation of the above symptoms. Although her history is well known to you, please allow me to reiterate it for the purpose of our medical record. The patient was accompanied to the clinic by her daughter French Ana who also provides collateral information. Records and images were personally reviewed where available.   HISTORY OF PRESENT ILLNESS: This is a pleasant 82 year old right-handed woman with a history of hypertension, hyperlipidemia, PAF on Eliquis, presenting for evaluation of seizures and dementia.   Dementia Records from [facility] were reviewed. She was evaluated in 09/2021 for memory loss, MoCA 11/30 with note of functional decline, diagnosed with dementia. 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 She underwent Neuropsychological testing in 01/2022 however test scores were invalid due to patient failing embedded effort measures. Per notes, "could not rule out a neurodegenerative disorder definitively but that the reported sudden onset of cognitive symptoms is not consistent with this possibility. Patient's deficits are reportedly closely associated with sleep disruption. Repeat testing in 1 year encouraged. Neurology notes indicate "Advised daughters  that there is some dementia which is probably due to more than one cause, vascular dementia and Alzheimer's are likely."   She feels her memory is fine. She lives with French Ana. French Ana reported continued progression of cognitive decline. She states she is driving and that she manages finances. She also says she does laundry. Tracy shakes her head. She stopped driving in 86/5784, she was getting lost. [employer], medications, meals. She needs assistance with dressing and bathing. She does not recall being in the hospital recently for seizures, stating "have I been sick?" She denies any headaches, dizziness, diplopia, dysarthria/dysphagia, neck/back pain. She wears Depends for bladder and bowel incontinence. No family history of dementia. She states she is in a good mood most of the time. It is hard to say if she is hallucinating, she was looking for someone last night who was deceased. No paranoia. She was started on Donepezil on last visit with [facility] in  08/2022, however a week after she wandered outside and fell on the grass and it was stopped. No further wandering behaviors since then.   2. Seizures. The first seizure occurred on 10/22/22, French Ana reports she was sitting then started staring off, unresponsive, then she curled up and had a convulsion. French Ana shows a video with eyes deviated to the right, facial twitching, whole body jerking, followed by sonorous respirations. She had a seizure on 01/04/23, video after the seizures shows she is confused, not following commands. She also had a seizure on 8/25, 02/26/23, then 2 on 03/04/23. She was brought to the ER for the seizures in May and most recently 9/20, bloodwork unremarkable, head CT no acute changes. She was discharged home on Depakote 500mg  TID however it seems to be making her more confused/forgetful, dose reduced to BID. French Ana notes all of the seizures  occurred in the setting of sleep deprivation. She has been having difficulty with sleep  maintenance, waking up every hour around twice a week. Depakote seems to have helped with sleep some. She denies any olfactory/gustatory hallucinations, deja vu, rising epigastric sensation, focal numbness/tingling/weakness, myoclonic jerks. No alcohol.  Epilepsy Risk Factors:  Dementia. She had a normal birth and early development.  There is no history of febrile convulsions, CNS infections such as meningitis/encephalitis, significant traumatic brain injury, neurosurgical procedures, or family history of seizures.   PAST MEDICAL HISTORY: Past Medical History:  Diagnosis Date   Anxiety    Cataract    Colon polyps    Dementia (HCC)    Depression    Dysrhythmia    heart palipations   Gallstones    GERD (gastroesophageal reflux disease)    HTN (hypertension)    Hypercholesteremia    PONV (postoperative nausea and vomiting)     PAST SURGICAL HISTORY: Past Surgical History:  Procedure Laterality Date   APPENDECTOMY     ARTHROSCOPIC REPAIR ACL  05/2008   Left knee   CARPAL TUNNEL RELEASE Right 07/14/2017   Procedure: CARPAL TUNNEL RELEASE;  Surgeon: Vickki Hearing, MD;  Location: [facility];  Service: Orthopedics;  Laterality: Right;   CATARACT EXTRACTION, BILATERAL  2020   CHOLECYSTECTOMY     DILATION AND CURETTAGE OF UTERUS     DORSAL COMPARTMENT RELEASE Right 07/14/2017   Procedure: RELEASE DORSAL COMPARTMENT (DEQUERVAIN);  Surgeon: Vickki Hearing, MD;  Location: [facility];  Service: Orthopedics;  Laterality: Right;   HIATAL HERNIA REPAIR N/A 03/24/2020   Procedure: LAPAROSCOPIC REPAIR OF HIATAL HERNIA, nissen fundoplication;  Surgeon: Luretha Murphy, MD;  Location: [facility];  Service: General;  Laterality: N/A;   HYSTEROSCOPY WITH D & C N/A 12/25/2013   Procedure: DILATATION AND CURETTAGE /HYSTEROSCOPY;  Surgeon: Miguel Aschoff, MD;  Location: [facility];  Service: Gynecology;  Laterality: N/A;   SHOULDER OPEN ROTATOR CUFF REPAIR Left 06/15/2018   Procedure: ROTATOR CUFF REPAIR SHOULDER  OPEN WITH ACROMIOPLASTY;  Surgeon: Vickki Hearing, MD;  Location: [facility];  Service: Orthopedics;  Laterality: Left;   TUBAL LIGATION      MEDICATIONS: Current Outpatient Medications on File Prior to Visit  Medication Sig Dispense Refill   apixaban (ELIQUIS) 5 MG TABS tablet Take 1 tablet by mouth twice daily 60 tablet 5   divalproex (DEPAKOTE) 500 MG DR tablet Take 1 tablet (500 mg total) by mouth 3 (three) times daily. (Patient taking differently: Take 500 mg by mouth 2 (two) times daily.) 90 tablet 1   escitalopram (LEXAPRO) 20 MG tablet Take 20 mg by mouth at bedtime.      LORazepam (ATIVAN) 0.5 MG tablet Take 0.5 mg by mouth 2 (two) times daily as needed for anxiety.     metoprolol succinate (TOPROL-XL) 50 MG 24 hr tablet Take 1.5 tablets (75 mg total) by mouth daily. (Patient taking differently: Take 50 mg by mouth daily.) 135 tablet 1   nystatin cream (MYCOSTATIN) Apply 1 Application topically 2 (two) times daily as needed.     pantoprazole (PROTONIX) 40 MG tablet Take 40 mg by mouth daily.      simvastatin (ZOCOR) 40 MG tablet Take 40 mg by mouth at bedtime.      Vitamin D, Ergocalciferol, (DRISDOL) 1.25 MG (50000 UNIT) CAPS capsule Take 50,000 Units by mouth once a week.     Current Facility-Administered Medications on File Prior to Visit  Medication Dose Route Frequency Provider Last Rate Last  Admin   fentaNYL (SUBLIMAZE) injection 25-50 mcg  25-50 mcg Intravenous Q5 min PRN Mal Amabile, MD        ALLERGIES: Allergies  Allergen Reactions   Hydrocodone     Lip swelling   Avelox [Moxifloxacin Hcl In Nacl] Other (See Comments)    insomnia   Codeine Swelling    Swelling of lips   Oxybutynin Palpitations and Other (See Comments)    Tongue felt coated     FAMILY HISTORY: Family History  Problem Relation Age of Onset   Heart failure Mother 75   Lung cancer Father 10   Breast cancer Sister    Diabetes Sister    Colon cancer Brother    Colon polyps Son    Colon  polyps Daughter     SOCIAL HISTORY: Social History   Socioeconomic History   Marital status: Widowed    Spouse name: Not on file   Number of children: 4   Years of education: Not on file   Highest education level: Not on file  Occupational History   Occupation: retired    Associate Professor: OTHER  Tobacco Use   Smoking status: Never   Smokeless tobacco: Never  Vaping Use   Vaping status: Never Used  Substance and Sexual Activity   Alcohol use: Not Currently   Drug use: No   Sexual activity: Not Currently  Other Topics Concern   Not on file  Social History Narrative   Are you right handed or left handed? Right    Are you currently employed ? no   What is your current occupation?   Do you live at home alone? No    Who lives with you? daughter   What type of home do you live in: 1 story or 2 story? 1 story        Social Determinants of Health   Financial Resource Strain: Low Risk  (01/13/2023)   Received from [facility]   Overall Financial Resource Strain (CARDIA)    Difficulty of Paying Living Expenses: Not very hard  Food Insecurity: No Food Insecurity (01/13/2023)   Received from [facility]   Hunger Vital Sign    Worried About Running Out of Food in the Last Year: Never true    Ran Out of Food in the Last Year: Never true  Transportation Needs: No Transportation Needs (01/13/2023)   Received from [facility] - Transportation    Lack of Transportation (Medical): No    Lack of Transportation (Non-Medical): No  Physical Activity: Unknown (01/13/2023)   Received from [facility]   Exercise Vital Sign    Days of Exercise per Week: 0 days    Minutes of Exercise per Session: Not on file  Stress: No Stress Concern Present (01/13/2023)   Received from [facility] - Occupational Stress Questionnaire    Feeling of Stress : Only a little  Social Connections: Moderately Integrated (01/13/2023)   Received from [facility]    Social Network    How would you rate your social network (family, work, friends)?: Adequate participation with social networks  Intimate Partner Violence: Not At Risk (01/13/2023)   Received from [facility]   HITS    Over the last 12 months how often did your partner physically hurt you?: 1    Over the last 12 months how often did your partner insult you or talk down to you?: 1    Over the last 12  months how often did your partner threaten you with physical harm?: 1    Over the last 12 months how often did your partner scream or curse at you?: 1     PHYSICAL EXAM: Vitals:   04/07/23 1004  BP: 130/85  Pulse: 76  SpO2: 94%   General: No acute distress Head:  Normocephalic/atraumatic, rash on bridge of nose Skin/Extremities: no edema Neurological Exam: Mental status: alert and oriented to person, place, and time, states age is 67. Slow to answer but answers appropriately. No dysarthria or aphasia, Fund of knowledge is appropriate.  Recent and remote memory are impaired.  Attention and concentration are reduced.  Able to name objects and repeat phrases. MMSE 24/30    04/07/2023   10:00 AM  MMSE - Mini Mental State Exam  Orientation to time 4  Orientation to Place 5  Registration 3  Attention/ Calculation 3  Recall 1  Language- name 2 objects 2  Language- repeat 1  Language- follow 3 step command 3  Language- read & follow direction 1  Write a sentence 1  Copy design 0  Total score 24    Cranial nerves: CN I: not tested CN II: pupils equal, round, visual fields intact CN III, IV, VI:  full range of motion, no nystagmus, no ptosis CN V: facial sensation intact CN VII: upper and lower face symmetric CN VIII: hearing intact to conversation Bulk & Tone: normal, no fasciculations. Motor: 5/5 on both UE with pain on right shoulder abduction, 3/5 left hip flexion, 5/5 on right.  Sensation: intact to light touch, cold, pin on both UE, reports decreased sensation on both LE.  Deep Tendon Reflexes: +1 throughout Cerebellar: no incoordination on finger to nose testing Gait: slow and cautious with walker, no ataxia.  Tremor: nons   IMPRESSION: This is a pleasant 82 year old right-handed woman with a history of hypertension, hyperlipidemia, PAF on Eliquis, presenting for evaluation of seizures and dementia. She was previously evaluated by Neurology for dementia, MMSE today 24/30, she needs assistance with all ADLs. She started having focal seizures that secondarily generalize since 10/2022, video reviewed shows right gaze deviation and tonic-clonic activity, seizures possibly from left hemisphere. MRI brain with and without contrast will be ordered to assess for underlying structural abnormality. Schedule 1-hour EEG. We discussed switching to extended-release Depakote 500mg : 2 tablets every night, side effects discussed. She was given a prescription for prn Valtoco for seizure rescue to prevent clusters. She was advised to use her walker at all times. She does not drive. Follow-up in 3 months, call for any changes.    Thank you for allowing me to participate in the care of this patient. Please do not hesitate to call for any questions or concerns.   Patrcia Dolly, M.D.  CC: Kathlen Brunswick, NP

## 2023-04-07 NOTE — Patient Instructions (Addendum)
Good to meet you.  Schedule open MRI brain with and without contrast  2. Schedule 1-hour EEG  3. We will switch to the extended-release Depakote 500mg : take 2 tablets every night  4. A prescription for as needed Valtoco nasal spray sent in for seizure rescue  4. Use walker at all times  5. Follow-up in 3 months, call for any changes   Seizure Precautions: 1. If medication has been prescribed for you to prevent seizures, take it exactly as directed.  Do not stop taking the medicine without talking to your doctor first, even if you have not had a seizure in a long time.   2. Avoid activities in which a seizure would cause danger to yourself or to others.  Don't operate dangerous machinery, swim alone, or climb in high or dangerous places, such as on ladders, roofs, or girders.  Do not drive unless your doctor says you may.  3. If you have any warning that you may have a seizure, lay down in a safe place where you can't hurt yourself.    4.  No driving for 6 months from last seizure, as per [facility].   Please refer to the following link on the Epilepsy Foundation of America's website for more information: http://www.epilepsyfoundation.org/answerplace/Social/driving/drivingu.cfm   5.  Maintain good sleep hygiene.  6.  Contact your doctor if you have any problems that may be related to the medicine you are taking.  7.  Call 911 and bring the patient back to the ED if:        A.  The seizure lasts longer than 5 minutes.       B.  The patient doesn't awaken shortly after the seizure  C.  The patient has new problems such as difficulty seeing, speaking or moving  D.  The patient was injured during the seizure  E.  The patient has a temperature over 102 F (39C)  F.  The patient vomited and now is having trouble breathing

## 2023-04-13 ENCOUNTER — Ambulatory Visit (INDEPENDENT_AMBULATORY_CARE_PROVIDER_SITE_OTHER): Payer: Medicare Other | Admitting: Neurology

## 2023-04-13 DIAGNOSIS — F0390 Unspecified dementia without behavioral disturbance: Secondary | ICD-10-CM

## 2023-04-13 DIAGNOSIS — G40209 Localization-related (focal) (partial) symptomatic epilepsy and epileptic syndromes with complex partial seizures, not intractable, without status epilepticus: Secondary | ICD-10-CM

## 2023-04-13 NOTE — Progress Notes (Signed)
EEG complete - results pending 

## 2023-04-19 ENCOUNTER — Other Ambulatory Visit: Payer: Self-pay | Admitting: Medical Genetics

## 2023-04-19 DIAGNOSIS — Z006 Encounter for examination for normal comparison and control in clinical research program: Secondary | ICD-10-CM

## 2023-04-20 NOTE — Procedures (Signed)
ELECTROENCEPHALOGRAM REPORT  Date of Study: 04/13/2023  Patient's Name: Donna Barron MRN: [id number] Date of Birth: 06/13/1941  Referring Provider: Dr. Patrcia Dolly  Clinical History: This is an 82 year old woman with dementia and new onset seizures with right gaze deviation. EEG for classification.  Medications: Depakote, Lexapro, Lorazepam, simvastatin, Eliquis  Technical Summary: A multichannel digital 1-hour EEG recording measured by the international 10-20 system with electrodes applied with paste and impedances below 5000 ohms performed as portable with EKG monitoring in an awake and drowsy patient.  Hyperventilation was not performed. Photic stimulation was performed.  The digital EEG was referentially recorded, reformatted, and digitally filtered in a variety of bipolar and referential montages for optimal display.   Description: The patient is awake and drowsy during the recording.  During maximal wakefulness, there is a symmetric, medium voltage 9 Hz posterior dominant rhythm that attenuates with eye opening. This is admixed with a small amount of occasional diffuse 4-5 Hz theta slowing of the waking background.  Sleep was not captured. Photic stimulation did not elicit any abnormalities.  There were no epileptiform discharges or electrographic seizures seen.    EKG lead was unremarkable.  Impression: This 1-hour awake and drowsy EEG is abnormal due to mild diffuse slowing of the waking background.  Clinical Correlation of the above findings indicates diffuse cerebral dysfunction that is non-specific in etiology and can be seen with hypoxic/ischemic injury, toxic/metabolic encephalopathies, neurodegenerative disorders, or medication effect.  The absence of epileptiform discharges does not rule out a clinical diagnosis of epilepsy.  Clinical correlation is advised.   Patrcia Dolly, M.D.

## 2023-05-04 ENCOUNTER — Telehealth: Payer: Self-pay

## 2023-05-04 NOTE — Telephone Encounter (Signed)
We'll see what MRI looks like, but does she think all of these symptoms started with the change to extended release Depakote 2 tabs at night? If yes, we can switch to a different seizure medication but we cannot stop Depakote cold [place].

## 2023-05-04 NOTE — Telephone Encounter (Signed)
Pt daughter called with results, EEG did not show any seizure activity, however it is not like a pregnancy test that is positive or negative, just a snapshot of her brain waves during the test.  Pt sleep is still off, her confusion on current things hs gotten worse her short term memory is off, MRI is scheduled for Friday, she has increase in weakness when she does not sleep

## 2023-05-05 NOTE — Telephone Encounter (Signed)
Pt daughter called no answer no voice mail will call back later

## 2023-05-06 MED ORDER — OXCARBAZEPINE 150 MG PO TABS: 150.0000 mg | ORAL_TABLET | Freq: Two times a day (BID) | ORAL | 6 refills | Status: DC

## 2023-05-06 MED ORDER — DIVALPROEX SODIUM ER 500 MG PO TB24: ORAL_TABLET | ORAL | 5 refills | Status: DC

## 2023-05-06 NOTE — Telephone Encounter (Signed)
Spoke with pt daughter and she dose feel that symptoms started with the change to extended release Depakote 2 tabs at night

## 2023-05-06 NOTE — Telephone Encounter (Signed)
Spoke with pt daughter French Ana informed her reduce the Depakote to 1 tablet every night. But will need to add a new medication to help prevent seizures. The new medication is oxcarbazepine 150mg  BID. It is a very low dose, we may need to increase if seizures recur. Side effects may include dizziness, drowsiness. New rx sent to pharmacy

## 2023-05-06 NOTE — Telephone Encounter (Signed)
I would like to reduce the Depakote to 1 tablet every night. But will need to add a new medication to help prevent seizures. The new medication is oxcarbazepine 150mg  BID. It is a very low dose, we may need to increase if seizures recur. Side effects may include dizziness, drowsiness. If she agrees, pls send in Rx and update Depakote Rx. Thanks

## 2023-05-16 ENCOUNTER — Telehealth: Payer: Self-pay

## 2023-05-16 NOTE — Telephone Encounter (Signed)
-----   Message from Van Clines sent at 05/14/2023 11:05 AM EST ----- Regarding: MRI results Pls let daughter know MRI brain did not show any tumor, stroke, or bleed. It showed age-related changes. Thanks ----- Message ----- From: Jonetta Osgood Sent: 05/11/2023  12:21 PM EST To: Van Clines, MD

## 2023-05-16 NOTE — Telephone Encounter (Signed)
Pt daughter called an informed MRI brain did not show any tumor, stroke, or bleed. It showed age-related changes

## 2023-05-22 ENCOUNTER — Other Ambulatory Visit: Payer: Self-pay | Admitting: Cardiology

## 2023-05-23 NOTE — Telephone Encounter (Signed)
Prescription refill request for Eliquis received. Indication: PAF Last office visit: 05/11/22  Donna Ferry MD Scr: 0.87 on 03/05/23 Age: 82 Weight: 103.4kg  Based on above findings Eliquis 5mg  twice daily is the appropriate dose.  Pt is past due for appt with Dr Wyline Mood.  Message sent to schedulers to make appt.  Refill approved x 1 only.

## 2023-06-06 ENCOUNTER — Ambulatory Visit: Payer: Medicare Other | Admitting: Nurse Practitioner

## 2023-06-06 NOTE — Progress Notes (Deleted)
  Cardiology Office Note:  .   Date:  06/06/2023  ID:  Donna Barron, DOB Jul 05, 1940, MRN [id number] PCP: April Manson, NP  [facility] Providers Cardiologist:  Dina Rich, MD { Click to update primary MD,subspecialty MD or APP then REFRESH:1}   History of Present Illness: .   Donna Barron is a 82 y.o. female with a PMH of PAF, HTN, HLD, and anxiety/depression, who presents today for overdue follow-up.   Last seen by Dr. Dina Rich on May 11, 2022.  She was overall doing very well at the time.  BP was elevated in office that day, she noted recent BP was well-controlled.  Was recommended to monitor home BPs and review her BP readings with PCP at visit next month.  Today she presents for overdue follow-up.  She states...  ROS: ***  Studies Reviewed: .        *** Risk Assessment/Calculations:   {Does this patient have ATRIAL FIBRILLATION?:[phone]} No BP recorded.  {Refresh Note OR Click here to enter BP  :1}***       Physical Exam:   VS:  There were no vitals taken for this visit.   Wt Readings from Last 3 Encounters:  04/07/23 212 lb 3.2 oz (96.3 kg)  03/04/23 215 lb (97.5 kg)  10/22/22 224 lb 13.9 oz (102 kg)    GEN: Well nourished, well developed in no acute distress NECK: No JVD; No carotid bruits CARDIAC: ***RRR, no murmurs, rubs, gallops RESPIRATORY:  Clear to auscultation without rales, wheezing or rhonchi  ABDOMEN: Soft, non-tender, non-distended EXTREMITIES:  No edema; No deformity   ASSESSMENT AND PLAN: .   ***    {Are you ordering a CV Procedure (e.g. stress test, cath, DCCV, TEE, etc)?   Press F2        :[id number]}  Dispo: ***  Signed, Sharlene Dory, NP

## 2023-06-22 ENCOUNTER — Encounter: Payer: Self-pay | Admitting: Neurology

## 2023-06-24 ENCOUNTER — Other Ambulatory Visit: Payer: Self-pay | Admitting: Cardiology

## 2023-06-27 NOTE — Telephone Encounter (Signed)
 Prescription refill request for Eliquis  received. Indication: PAF Last office visit: 05/11/22  JINNY Ross MD  (OV 07/19/23) Scr: 0.87 on 03/05/23  Epic Age: 83 Weight: 103.4kg  Based on above findings Eliquis  5mg  twice daily is the appropriate dose.  Refill approved.

## 2023-07-04 ENCOUNTER — Encounter: Payer: Self-pay | Admitting: Neurology

## 2023-07-04 ENCOUNTER — Ambulatory Visit (INDEPENDENT_AMBULATORY_CARE_PROVIDER_SITE_OTHER): Payer: Medicare Other | Admitting: Neurology

## 2023-07-04 VITALS — BP 104/62 | Ht 61.0 in | Wt 212.3 lb

## 2023-07-04 DIAGNOSIS — F0390 Unspecified dementia without behavioral disturbance: Secondary | ICD-10-CM | POA: Diagnosis not present

## 2023-07-04 DIAGNOSIS — G40209 Localization-related (focal) (partial) symptomatic epilepsy and epileptic syndromes with complex partial seizures, not intractable, without status epilepticus: Secondary | ICD-10-CM

## 2023-07-04 DIAGNOSIS — R5381 Other malaise: Secondary | ICD-10-CM

## 2023-07-04 MED ORDER — OXCARBAZEPINE 150 MG PO TABS: 150.0000 mg | ORAL_TABLET | Freq: Two times a day (BID) | ORAL | 3 refills | Status: DC

## 2023-07-04 MED ORDER — DIVALPROEX SODIUM ER 500 MG PO TB24: ORAL_TABLET | ORAL | 3 refills | Status: DC

## 2023-07-04 NOTE — Patient Instructions (Signed)
Good to see you.  Continue all your medications  2. Referral will be sent for home Physical therapy  3. Continue working on staying more active and awake in the daytime to help sleep better at night. Can try taking melatonin 3mg  at night to help  4. Continue close supervision, follow-up in 3-4 months, call for any changes   Seizure Precautions: 1. If medication has been prescribed for you to prevent seizures, take it exactly as directed.  Do not stop taking the medicine without talking to your doctor first, even if you have not had a seizure in a long time.   2. Avoid activities in which a seizure would cause danger to yourself or to others.  Don't operate dangerous machinery, swim alone, or climb in high or dangerous places, such as on ladders, roofs, or girders.  Do not drive unless your doctor says you may.  3. If you have any warning that you may have a seizure, lay down in a safe place where you can't hurt yourself.    4.  No driving for 6 months from last seizure, as per [facility].   Please refer to the following link on the Epilepsy Foundation of America's website for more information: http://www.epilepsyfoundation.org/answerplace/Social/driving/drivingu.cfm   5.  Maintain good sleep hygiene.   6.  Contact your doctor if you have any problems that may be related to the medicine you are taking.  7.  Call 911 and bring the patient back to the ED if:        A.  The seizure lasts longer than 5 minutes.       B.  The patient doesn't awaken shortly after the seizure  C.  The patient has new problems such as difficulty seeing, speaking or moving  D.  The patient was injured during the seizure  E.  The patient has a temperature over 102 F (39C)  F.  The patient vomited and now is having trouble breathing

## 2023-07-04 NOTE — Progress Notes (Unsigned)
NEUROLOGY FOLLOW UP OFFICE NOTE  Donna Barron [id number] Mar 11, 1941  HISTORY OF PRESENT ILLNESS: I had the pleasure of seeing Donna Barron in follow-up in the neurology clinic on 07/04/2023.  The patient was last seen 3 months ago for seizures and dementia. She is again accompanied by her daughter Donna Barron and significant other Donna Barron who help supplement the history today.  Records and images were personally reviewed where available.  1-hour EEG in 03/2023 showed mild diffuse slowing. MRI brain with and without contrast done 04/2023 no acute changes, there was moderate atrophy, more pronounced in the hippocampal regions, mild chronic microvascular disease. She was having side effects on BID dosing of Depakote and was switched to ER dosing once a day, however Donna Barron reported worsening confusion/sleep difficulties/memory changes. Low dose Oxcarbazepine 150mg  BID was started, Depakote 500mg  reduced to 1 tablet at bedtime. Donna Barron is happy to report that she has not had any seizures since 02/2023. The confusion is better as well on lower dose of Depakote. Donna Barron had contacted our office about need for home PT, she has been falling with her legs seeming to give out when she tries to walk with or without her walker. Family would have to help lift her up on the bed. She reports knees hurt at night. No back pain. No focal numbness/tingling. She denies any headaches, dizziness, vision changes. She sleeps during the day and has difficulty sleeping at night. No personality changes. Donna Barron manages medications, finances, meals, she needs assistance with all ADLs.    History on Initial Assessment 04/07/2023:  This is a pleasant 83 year old right-handed woman with a history of hypertension, hyperlipidemia, PAF on Eliquis, presenting for evaluation of seizures and dementia.   Dementia Records from [facility] were reviewed. She was evaluated in 09/2021 for memory loss, MoCA 11/30 with note of functional decline,  diagnosed with dementia. 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 MRA head showed intracranial atherosclerotic disease, most notably severe stenosis within the non-dominant right vertebral artery just proximal to the vertebrobasilar junction, 1-51mm inferiorly projecting vascular protrusion arising from the supraclinoid left ICA, which may reflect a small aneurysm or infundibulum. She underwent Neuropsychological testing in 01/2022 however test scores were invalid due to patient failing embedded effort measures. Per notes, "could not rule out a neurodegenerative disorder definitively but that the reported sudden onset of cognitive symptoms is not consistent with this possibility. Patient's deficits are reportedly closely associated with sleep disruption. Repeat testing in 1 year encouraged. 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 She lives with Donna Barron. Donna Barron reported continued progression of cognitive decline. She states she is driving and that she manages finances. She also says she does laundry. Donna Barron shakes her head. She stopped driving in 24/4010, she was getting lost. [employer], medications, meals. She needs assistance with dressing and bathing. She does not recall being in the hospital recently for seizures, stating "have I been sick?" She denies any headaches, dizziness, diplopia, dysarthria/dysphagia, neck/back pain. She wears Depends for bladder and bowel incontinence. No family history of dementia. She states she is in a good mood most of the time. It is hard to say if she is hallucinating, she was looking for  someone last night who was deceased. No paranoia. She was started on  Donepezil on last visit with [facility] in  08/2022, however a week after she wandered outside and fell on the grass and it was stopped. No further wandering behaviors since then.   2. Seizures. The first seizure occurred on 10/22/22, Donna Barron reports she was sitting then started staring off, unresponsive, then she curled up and had a convulsion. Donna Barron shows a video with eyes deviated to the right, facial twitching, whole body jerking, followed by sonorous respirations. She had a seizure on 01/04/23, video after the seizures shows she is confused, not following commands. She also had a seizure on 8/25, 02/26/23, then 2 on 03/04/23. She was brought to the ER for the seizures in May and most recently 9/20, bloodwork unremarkable, head CT no acute changes. She was discharged home on Depakote 500mg  TID however it seems to be making her more confused/forgetful, dose reduced to BID. Donna Barron notes all of the seizures occurred in the setting of sleep deprivation. She has been having difficulty with sleep maintenance, waking up every hour around twice a week. Depakote seems to have helped with sleep some. She denies any olfactory/gustatory hallucinations, deja vu, rising epigastric sensation, focal numbness/tingling/weakness, myoclonic jerks. No alcohol.  Epilepsy Risk Factors:  Dementia. She had a normal birth and early development.  There is no history of febrile convulsions, CNS infections such as meningitis/encephalitis, significant traumatic brain injury, neurosurgical procedures, or family history of seizures.   PAST MEDICAL HISTORY: Past Medical History:  Diagnosis Date   Anxiety    Cataract    Colon polyps    Dementia (HCC)    Depression    Dysrhythmia    heart palipations   Gallstones    GERD (gastroesophageal reflux disease)    HTN (hypertension)    Hypercholesteremia    PONV (postoperative nausea and vomiting)     MEDICATIONS: Current Outpatient Medications on File Prior to Visit  Medication Sig  Dispense Refill   apixaban (ELIQUIS) 5 MG TABS tablet Take 1 tablet (5 mg total) by mouth 2 (two) times daily. 60 tablet 0   diazePAM, 20 MG Dose, (VALTOCO 20 MG DOSE) 2 x 10 MG/0.1ML LQPK Administer 1 spray in one nostril, 1 spray in other nostril (this is 1 dose) as needed for seizure. May give second dose after 4 hours if needed. 5 each 5   divalproex (DEPAKOTE ER) 500 MG 24 hr tablet Take 1 tablets every night 30 tablet 5   escitalopram (LEXAPRO) 20 MG tablet Take 20 mg by mouth at bedtime.      LORazepam (ATIVAN) 0.5 MG tablet Take 0.5 mg by mouth 2 (two) times daily as needed for anxiety.     metoprolol succinate (TOPROL-XL) 50 MG 24 hr tablet Take 1.5 tablets (75 mg total) by mouth daily. (Patient taking differently: Take 50 mg by mouth daily.) 135 tablet 1   nystatin cream (MYCOSTATIN) Apply 1 Application topically 2 (two) times daily as needed.     OXcarbazepine (TRILEPTAL) 150 MG tablet Take 1 tablet (150 mg total) by mouth 2 (two) times daily. 60 tablet 6   pantoprazole (PROTONIX) 40 MG tablet Take 40 mg by mouth daily.      simvastatin (ZOCOR) 40 MG tablet Take 40 mg by mouth at bedtime.      Vitamin D, Ergocalciferol, (DRISDOL) 1.25 MG (50000 UNIT) CAPS capsule Take 50,000 Units by mouth once a week.     Current Facility-Administered Medications on File Prior  to Visit  Medication Dose Route Frequency Provider Last Rate Last Admin   fentaNYL (SUBLIMAZE) injection 25-50 mcg  25-50 mcg Intravenous Q5 min PRN Mal Amabile, MD        ALLERGIES: Allergies  Allergen Reactions   Hydrocodone     Lip swelling   Avelox [Moxifloxacin Hcl In Nacl] Other (See Comments)    insomnia   Codeine Swelling    Swelling of lips   Oxybutynin Palpitations and Other (See Comments)    Tongue felt coated     FAMILY HISTORY: Family History  Problem Relation Age of Onset   Heart failure Mother 80   Lung cancer Father 89   Breast cancer Sister    Diabetes Sister    Colon cancer Brother     Colon polyps Son    Colon polyps Daughter     SOCIAL HISTORY: Social History   Socioeconomic History   Marital status: Widowed    Spouse name: Not on file   Number of children: 4   Years of education: Not on file   Highest education level: Not on file  Occupational History   Occupation: retired    Associate Professor: OTHER  Tobacco Use   Smoking status: Never   Smokeless tobacco: Never  Vaping Use   Vaping status: Never Used  Substance and Sexual Activity   Alcohol use: Not Currently   Drug use: No   Sexual activity: Not Currently  Other Topics Concern   Not on file  Social History Narrative   Are you right handed or left handed? Right    Are you currently employed ? no   What is your current occupation?   Do you live at home alone? No    Who lives with you? daughter   What type of home do you live in: 1 story or 2 story? 1 story        Social Drivers of Corporate investment banker Strain: Low Risk  (01/13/2023)   Received from [facility]   Overall Financial Resource Strain (CARDIA)    Difficulty of Paying Living Expenses: Not very hard  Food Insecurity: No Food Insecurity (01/13/2023)   Received from [facility]   Hunger Vital Sign    Worried About Running Out of Food in the Last Year: Never true    Ran Out of Food in the Last Year: Never true  Transportation Needs: No Transportation Needs (01/13/2023)   Received from [facility] - Transportation    Lack of Transportation (Medical): No    Lack of Transportation (Non-Medical): No  Physical Activity: Unknown (01/13/2023)   Received from [facility]   Exercise Vital Sign    Days of Exercise per Week: 0 days    Minutes of Exercise per Session: Not on file  Stress: No Stress Concern Present (01/13/2023)   Received from [facility] - Occupational Stress Questionnaire    Feeling of Stress : Only a little  Social Connections: Moderately Integrated (01/13/2023)   Received  from [facility]   Social Network    How would you rate your social network (family, work, friends)?: Adequate participation with social networks  Intimate Partner Violence: Not At Risk (01/13/2023)   Received from [facility]   HITS    Over the last 12 months how often did your partner physically hurt you?: Never    Over the last 12 months how often did your partner insult you or talk  down to you?: Never    Over the last 12 months how often did your partner threaten you with physical harm?: Never    Over the last 12 months how often did your partner scream or curse at you?: Never     PHYSICAL EXAM: Vitals:   07/04/23 1459  BP: 104/62  SpO2: 95%   General: No acute distress, sitting on wheelchair Head:  Normocephalic/atraumatic Skin/Extremities: No rash, no edema Neurological Exam: alert and awake. No aphasia or dysarthria. Fund of knowledge is appropriate. Attention and concentration are normal.   Cranial nerves: Pupils equal, round. Extraocular movements intact with no nystagmus. Visual fields full.  No facial asymmetry.  Motor: Bulk and tone normal, muscle strength 5/5 throughout with no pronator drift.  Reflexes +1 throughout. Finger to nose testing intact.  Gait not tested.    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 She is doing well with addition of Oxcarbazepine 150mg  BID to Depakote ER 500mg  at bedtime, no seizures since 02/2023. She has dementia but the confusion is improved with reduction in Depakote. We discussed expectations from dementia medications and agreed to hold off at this time. She has been having falls, no focal weakness on exam, MRI brain no acute changes. Falls appear to be due to musculoskeletal issues, referral to home PT will be ordered. Continue close supervision. She does not drive. Follow-up in 4 months, call for any changes.     Thank you for allowing me to participate in her care.  Please do not hesitate to call for any questions or concerns.    Patrcia Dolly, M.D.   CC: Kathlen Brunswick, NP

## 2023-07-06 ENCOUNTER — Encounter: Payer: Self-pay | Admitting: Neurology

## 2023-07-08 ENCOUNTER — Telehealth: Payer: Self-pay

## 2023-07-08 NOTE — Telephone Encounter (Signed)
Pt daughter called an informed that [facility] has accepted and should be reaching out to get pt scheduled,

## 2023-07-13 ENCOUNTER — Telehealth: Payer: Self-pay | Admitting: Neurology

## 2023-07-13 NOTE — Telephone Encounter (Signed)
Home PT to work on strengthening and mobility training. Twice a week for 3 weeks and once a week for 3 weeks

## 2023-07-13 NOTE — Telephone Encounter (Signed)
Home health called an informed that ok for Home PT to work on strengthening and mobility training. Twice a week for 3 weeks and once a week for 3 weeks

## 2023-07-19 ENCOUNTER — Telehealth: Payer: Self-pay

## 2023-07-19 ENCOUNTER — Ambulatory Visit: Payer: Medicare Other | Admitting: Nurse Practitioner

## 2023-07-19 DIAGNOSIS — F0390 Unspecified dementia without behavioral disturbance: Secondary | ICD-10-CM

## 2023-07-19 DIAGNOSIS — R5381 Other malaise: Secondary | ICD-10-CM

## 2023-07-19 DIAGNOSIS — G40209 Localization-related (focal) (partial) symptomatic epilepsy and epileptic syndromes with complex partial seizures, not intractable, without status epilepticus: Secondary | ICD-10-CM

## 2023-07-19 NOTE — Progress Notes (Deleted)
  Cardiology Office Note:  .   Date:  07/19/2023  ID:  Donna Barron, DOB [birth year], MRN [id number] PCP: [name], NP  [facility] Providers Cardiologist:  Alvan Carrier, MD { Click to update primary MD,subspecialty MD or APP then REFRESH:1}   History of Present Illness: .   Donna Barron is a 83 y.o. female with a PMH of PAF, palpitations, mixed hyperlipidemia, hypertension, and neurocognitive disorder, history of seizures, who presents today for scheduled follow-up.  Last seen by Dr. Carrier Alvan on May 11, 2022.  Was overall doing well at that time.  ED visit on March 04, 2023 for altered mental status, seizure-like activity.  She noted 7 or 8 of seizure-like episodes for past 5 months.  She was not on any antiepileptic medications at that time.  CT of the head was negative for anything acute.  Neurology was consulted.  She received valproic  acid IV.  Instructed to follow-up with outpatient neurology.  She presents today for scheduled follow-up.  She states...  ROS: ***  Studies Reviewed: .        *** Risk Assessment/Calculations:   {Does this patient have ATRIAL FIBRILLATION?:[phone]} No BP recorded.  {Refresh Note OR Click here to enter BP  :1}***       Physical Exam:   VS:  There were no vitals taken for this visit.   Wt Readings from Last 3 Encounters:  07/04/23 212 lb 4.8 oz (96.3 kg)  04/07/23 212 lb 3.2 oz (96.3 kg)  03/04/23 215 lb (97.5 kg)    GEN: Well nourished, well developed in no acute distress NECK: No JVD; No carotid bruits CARDIAC: ***RRR, no murmurs, rubs, gallops RESPIRATORY:  Clear to auscultation without rales, wheezing or rhonchi  ABDOMEN: Soft, non-tender, non-distended EXTREMITIES:  No edema; No deformity   ASSESSMENT AND PLAN: .   ***    {Are you ordering a CV Procedure (e.g. stress test, cath, DCCV, TEE, etc)?   Press F2        :[id number]}  Dispo: ***  Signed, Almarie Crate, NP

## 2023-07-19 NOTE — Telephone Encounter (Signed)
 Michael with home health called stated that pt has hd 3 falls since [place]. They are asking for verbal orders for a Social worker to talk about passable placement and they are asking for orders for a transport chair and a bedside potty chair.  Verbal orders given by Dr Georjean,  [place] called given the order for Social worker to come in to talk to pt daughter. Orders placed for transport chair and a bedside potty chair. Orders faxed to [phone]

## 2023-07-22 ENCOUNTER — Telehealth: Payer: Self-pay | Admitting: Neurology

## 2023-07-22 NOTE — Telephone Encounter (Signed)
 Pt. Home care rep is calling to get approval for a home nurse to be sent as Pt has 7cm blister on heel

## 2023-07-25 NOTE — Telephone Encounter (Signed)
 Donna Barron called given verbal orders ok for home health nurse but PCP will have to follow up with the care of the blister ,

## 2023-07-29 ENCOUNTER — Other Ambulatory Visit: Payer: Self-pay | Admitting: Cardiology

## 2023-07-29 NOTE — Telephone Encounter (Signed)
Prescription refill request for Eliquis received. Indication:afib Last office visit:upcoming Scr:0.87  9/24 Age: 83 Weight:96.3  kg  Prescription refilled

## 2023-08-19 ENCOUNTER — Telehealth: Payer: Self-pay | Admitting: Neurology

## 2023-08-19 NOTE — Telephone Encounter (Signed)
 Carmon Sails from PT called to leave 3 messages for Dr Aquino's nurse.  1) Donna Barron had another fall on Monday, EMS got her up with no injuries.  2) The wheelchair that was delivered was a slandered wheelchair. Needs Dr Karel Jarvis to clarify order that patient needed transport chair for a switch out.  3) Would like 1 more visit for complete safety education due to bathroom being remodeled.

## 2023-08-22 NOTE — Telephone Encounter (Signed)
 Carmon Sails called no answer left a voice mail verbal orders given for  1) The wheelchair that was delivered was a standered wheelchair. Needs Dr Karel Jarvis to clarify order that patient needed transport chair for a switch out.   2) Would like 1 more visit for complete safety education due to bathroom being remodeled.

## 2023-08-22 NOTE — Telephone Encounter (Signed)
 Pls contact Carmon Sails and let her know ok for what is needed (PT visit, transport wheelchair), thanks

## 2023-08-24 ENCOUNTER — Encounter: Payer: Self-pay | Admitting: Neurology

## 2023-08-25 MED ORDER — DIVALPROEX SODIUM ER 250 MG PO TB24: ORAL_TABLET | ORAL | 6 refills | Status: DC

## 2023-08-29 ENCOUNTER — Encounter: Payer: Self-pay | Admitting: Nurse Practitioner

## 2023-08-29 ENCOUNTER — Ambulatory Visit: Payer: Medicare Other | Attending: Nurse Practitioner | Admitting: Nurse Practitioner

## 2023-08-29 VITALS — BP 110/68 | HR 96 | Ht 61.0 in | Wt 199.0 lb

## 2023-08-29 DIAGNOSIS — I4891 Unspecified atrial fibrillation: Secondary | ICD-10-CM | POA: Diagnosis present

## 2023-08-29 DIAGNOSIS — R531 Weakness: Secondary | ICD-10-CM | POA: Insufficient documentation

## 2023-08-29 DIAGNOSIS — R600 Localized edema: Secondary | ICD-10-CM | POA: Insufficient documentation

## 2023-08-29 DIAGNOSIS — I10 Essential (primary) hypertension: Secondary | ICD-10-CM | POA: Insufficient documentation

## 2023-08-29 DIAGNOSIS — I480 Paroxysmal atrial fibrillation: Secondary | ICD-10-CM

## 2023-08-29 DIAGNOSIS — E782 Mixed hyperlipidemia: Secondary | ICD-10-CM

## 2023-08-29 DIAGNOSIS — E785 Hyperlipidemia, unspecified: Secondary | ICD-10-CM | POA: Diagnosis present

## 2023-08-29 MED ORDER — APIXABAN 5 MG PO TABS: 5.0000 mg | ORAL_TABLET | Freq: Two times a day (BID) | ORAL | 5 refills | Status: DC

## 2023-08-29 MED ORDER — SIMVASTATIN 40 MG PO TABS: 40.0000 mg | ORAL_TABLET | Freq: Every day | ORAL | 4 refills | Status: DC

## 2023-08-29 MED ORDER — METOPROLOL SUCCINATE ER 50 MG PO TB24: 75.0000 mg | ORAL_TABLET | Freq: Every day | ORAL | 1 refills | Status: DC

## 2023-08-29 NOTE — Patient Instructions (Addendum)
 Medication Instructions:  Your physician recommends that you continue on your current medications as directed. Please refer to the Current Medication list given to you today.   Labwork: In 1 week   Testing/Procedures: Your physician has requested that you have an echocardiogram. Echocardiography is a painless test that uses sound waves to create images of your heart. It provides your doctor with information about the size and shape of your heart and how well your heart's chambers and valves are working. This procedure takes approximately one hour. There are no restrictions for this procedure. Please do NOT wear cologne, perfume, aftershave, or lotions (deodorant is allowed). Please arrive 15 minutes prior to your appointment time.  Please note: We ask at that you not bring children with you during ultrasound (echo/ vascular) testing. Due to room size and safety concerns, children are not allowed in the ultrasound rooms during exams. Our front office staff cannot provide observation of children in our lobby area while testing is being conducted. An adult accompanying a patient to their appointment will only be allowed in the ultrasound room at the discretion of the ultrasound technician under special circumstances. We apologize for any inconvenience.  Follow-Up: Your physician recommends that you schedule a follow-up appointment in: 3 months    Any Other Special Instructions Will Be Listed Below (If Applicable).  If you need a refill on your cardiac medications before your next appointment, please call your pharmacy.

## 2023-08-29 NOTE — Progress Notes (Unsigned)
 Cardiology Office Note:  .   Date:  08/29/2023 ID:  Donna Barron, DOB April 17, 1941, MRN [id number] PCP: Donna Manson, NP  [facility] Providers Cardiologist:  Donna Rich, MD    History of Present Illness: .   Donna Barron is a 83 y.o. female with a PMH of A-fib, hypertension, hyperlipidemia, GERD, anxiety, depression, who presents today for scheduled follow-up.  Last seen by Dr. Dina Barron on May 11, 2022 via telephone visit.  She was overall doing very well at this time.  Today she presents for scheduled follow-up with her daughter Donna Barron. Pt is not a reliable historian due to fatigue noted on exam. Pt overall is doing well. Daughter states pt has swelling to BLE. She also reports fatigue and is not sleeping well per daughter's report. Admits to weakness, pt is mainly sedentary and in wheelchair during office visit today. Says she had a recent seizure, now doing better on current medication regimen. Denies any recent falls, but when having weakness has to sit down in wheelchair to prevent from falling. Just had labs performed with [facility] and are overall benign.   ROS: Negative. See HPI.   Studies Reviewed: Marland Kitchen    EKG: EKG is not ordered today.   Echo 07/2021:  1. Left ventricular ejection fraction, by estimation, is 65 to 70%. The  left ventricle has normal function. The left ventricle has no regional  wall motion abnormalities. There is severe left ventricular hypertrophy.  Left ventricular diastolic parameters   are consistent with Grade I diastolic dysfunction (impaired relaxation).   2. Right ventricular systolic function is normal. The right ventricular  size is normal. Tricuspid regurgitation signal is inadequate for assessing  PA pressure.   3. The mitral valve is abnormal. No evidence of mitral valve  regurgitation. No evidence of mitral stenosis. Moderate mitral annular  calcification.   4. The aortic valve is tricuspid. Aortic valve  regurgitation is not  visualized. No aortic stenosis is present.   5. The inferior vena cava is normal in size with greater than 50%  respiratory variability, suggesting right atrial pressure of 3 mmHg.   Comparison(s): LVEF 60-65%.   Physical Exam:   VS:  BP 110/68 (BP Location: Right Arm, Patient Position: Sitting, Cuff Size: Large)   Pulse 96   Ht 5\' 1"  (1.549 m)   Wt 199 lb (90.3 kg)   SpO2 93%   BMI 37.60 kg/m    Wt Readings from Last 3 Encounters:  08/29/23 199 lb (90.3 kg)  07/04/23 212 lb 4.8 oz (96.3 kg)  04/07/23 212 lb 3.2 oz (96.3 kg)    GEN: Obese, 83 y.o. female in no acute distress, fatigued and sitting in wheelchair NECK: No JVD; No carotid bruits CARDIAC: S1/S2, RRR, no murmurs, rubs, gallops RESPIRATORY:  Clear and diminished to auscultation without rales, wheezing or rhonchi  ABDOMEN: Soft, non-tender, non-distended EXTREMITIES:  2-3+ pitting edema to BLE; No deformity   ASSESSMENT AND PLAN: .    A-fib Denies any tachycardia or palpitations. HR is well controlled today. Continue Eliquis for stroke prevention. She is on appropriate dosage and denies any bleeding issues. Will provide refill of Eliquis.   HTN BP stable. Discussed to monitor BP at home at least 2 hours after medications and sitting for 5-10 minutes. Continue current medication regimen. Heart healthy diet and regular cardiovascular exercise as tolerated encouraged. Will provide refills per her request.   HLD LDL 59 01/2023. Continue simvastatin - will provide refill.  Heart healthy diet and regular cardiovascular exercise encouraged.   Leg edema Possibly due to sedentary/vascular insufficiency. 2+ PT pulses on exam. Recommended to begin Lasix, however pt's daughter politely declines. Will obtain Echo to evaluate for HF and obtain BMET, proBNP and Mag. If kidney function permits, plan to address beginning Lasix at next OV. Low salt, heart healthy diet encouraged. Leg elevation PRN recommended.   5.  Weakness  Most likely d/t age and deconditioning. Recommended to f/u with PCP for further evaluation and recommended PT.   Dispo: Will provide refills per her request. Follow-up with me/APP in 3 months or sooner if anything changes.   Signed, Sharlene Dory, NP

## 2023-09-09 MED ORDER — DIVALPROEX SODIUM ER 250 MG PO TB24: ORAL_TABLET | ORAL | 6 refills | Status: DC

## 2023-09-09 NOTE — Addendum Note (Signed)
 Addended by: Van Clines on: 09/09/2023 02:18 PM   Modules accepted: Orders

## 2023-09-13 ENCOUNTER — Other Ambulatory Visit: Payer: Self-pay

## 2023-09-13 DIAGNOSIS — F0390 Unspecified dementia without behavioral disturbance: Secondary | ICD-10-CM

## 2023-09-13 NOTE — Telephone Encounter (Signed)
 Order for transport chair faxed to [phone]

## 2023-09-21 ENCOUNTER — Ambulatory Visit: Payer: Medicare Other | Admitting: Neurology

## 2023-09-27 ENCOUNTER — Encounter: Payer: Self-pay | Admitting: Neurology

## 2023-09-29 ENCOUNTER — Ambulatory Visit: Attending: Nurse Practitioner

## 2023-09-29 DIAGNOSIS — R600 Localized edema: Secondary | ICD-10-CM | POA: Diagnosis not present

## 2023-09-30 LAB — ECHOCARDIOGRAM COMPLETE
AR max vel: 1.96 cm2
AV Area VTI: 2.3 cm2
AV Area mean vel: 2.04 cm2
AV Mean grad: 5 mmHg
AV Peak grad: 9.5 mmHg
Ao pk vel: 1.54 m/s
Area-P 1/2: 2.46 cm2
Calc EF: 72.1 %
MV VTI: 2.2 cm2
S' Lateral: 2.9 cm
Single Plane A2C EF: 72.6 %
Single Plane A4C EF: 69.6 %

## 2023-10-04 ENCOUNTER — Ambulatory Visit: Admitting: Physician Assistant

## 2023-10-07 ENCOUNTER — Other Ambulatory Visit: Payer: Self-pay

## 2023-10-07 DIAGNOSIS — F0390 Unspecified dementia without behavioral disturbance: Secondary | ICD-10-CM

## 2023-10-07 DIAGNOSIS — R5381 Other malaise: Secondary | ICD-10-CM

## 2023-10-07 DIAGNOSIS — W19XXXA Unspecified fall, initial encounter: Secondary | ICD-10-CM

## 2023-10-07 NOTE — Telephone Encounter (Signed)
 Order placed to [facility] to see if they will take her again with out being seen 1st

## 2023-10-10 ENCOUNTER — Telehealth: Payer: Self-pay | Admitting: Neurology

## 2023-10-10 NOTE — Telephone Encounter (Signed)
 Patients daughter called to schedule that VV for 4/22 with sara but today is 4/28. [facility] doesn't have anything available until 10/17/23. The patient has an appt with Ty Gales 10/13/23

## 2023-10-10 NOTE — Telephone Encounter (Signed)
 Ok for VV with me on 5/1 if they cannot bring her in, thanks

## 2023-10-13 ENCOUNTER — Other Ambulatory Visit: Payer: Self-pay

## 2023-10-13 ENCOUNTER — Emergency Department (HOSPITAL_COMMUNITY)

## 2023-10-13 ENCOUNTER — Encounter (HOSPITAL_COMMUNITY): Payer: Self-pay

## 2023-10-13 ENCOUNTER — Telehealth (INDEPENDENT_AMBULATORY_CARE_PROVIDER_SITE_OTHER): Payer: Medicare Other | Admitting: Neurology

## 2023-10-13 ENCOUNTER — Inpatient Hospital Stay (HOSPITAL_COMMUNITY)
Admission: EM | Admit: 2023-10-13 | Discharge: 2023-10-17 | DRG: 689 | Disposition: A | Discharge status: Skilled Nursing Facility | Attending: Internal Medicine | Admitting: Internal Medicine

## 2023-10-13 ENCOUNTER — Encounter: Payer: Self-pay | Admitting: Neurology

## 2023-10-13 VITALS — Ht 62.0 in | Wt 190.0 lb

## 2023-10-13 DIAGNOSIS — R531 Weakness: Secondary | ICD-10-CM | POA: Diagnosis present

## 2023-10-13 DIAGNOSIS — Z8249 Family history of ischemic heart disease and other diseases of the circulatory system: Secondary | ICD-10-CM

## 2023-10-13 DIAGNOSIS — R4182 Altered mental status, unspecified: Principal | ICD-10-CM

## 2023-10-13 DIAGNOSIS — Z7901 Long term (current) use of anticoagulants: Secondary | ICD-10-CM

## 2023-10-13 DIAGNOSIS — F0280 Dementia in other diseases classified elsewhere without behavioral disturbance: Secondary | ICD-10-CM | POA: Diagnosis present

## 2023-10-13 DIAGNOSIS — I480 Paroxysmal atrial fibrillation: Secondary | ICD-10-CM | POA: Diagnosis present

## 2023-10-13 DIAGNOSIS — F0390 Unspecified dementia without behavioral disturbance: Secondary | ICD-10-CM

## 2023-10-13 DIAGNOSIS — F0283 Dementia in other diseases classified elsewhere, unspecified severity, with mood disturbance: Secondary | ICD-10-CM | POA: Diagnosis present

## 2023-10-13 DIAGNOSIS — Z801 Family history of malignant neoplasm of trachea, bronchus and lung: Secondary | ICD-10-CM

## 2023-10-13 DIAGNOSIS — E782 Mixed hyperlipidemia: Secondary | ICD-10-CM | POA: Diagnosis present

## 2023-10-13 DIAGNOSIS — G309 Alzheimer's disease, unspecified: Secondary | ICD-10-CM | POA: Diagnosis present

## 2023-10-13 DIAGNOSIS — N390 Urinary tract infection, site not specified: Principal | ICD-10-CM | POA: Diagnosis present

## 2023-10-13 DIAGNOSIS — E66811 Obesity, class 1: Secondary | ICD-10-CM | POA: Diagnosis present

## 2023-10-13 DIAGNOSIS — G40909 Epilepsy, unspecified, not intractable, without status epilepticus: Secondary | ICD-10-CM | POA: Diagnosis present

## 2023-10-13 DIAGNOSIS — B9620 Unspecified Escherichia coli [E. coli] as the cause of diseases classified elsewhere: Secondary | ICD-10-CM | POA: Diagnosis present

## 2023-10-13 DIAGNOSIS — Z79899 Other long term (current) drug therapy: Secondary | ICD-10-CM

## 2023-10-13 DIAGNOSIS — E876 Hypokalemia: Secondary | ICD-10-CM | POA: Diagnosis not present

## 2023-10-13 DIAGNOSIS — Z833 Family history of diabetes mellitus: Secondary | ICD-10-CM

## 2023-10-13 DIAGNOSIS — G9341 Metabolic encephalopathy: Secondary | ICD-10-CM | POA: Diagnosis present

## 2023-10-13 DIAGNOSIS — E6609 Other obesity due to excess calories: Secondary | ICD-10-CM | POA: Diagnosis not present

## 2023-10-13 DIAGNOSIS — Z83719 Family history of colon polyps, unspecified: Secondary | ICD-10-CM

## 2023-10-13 DIAGNOSIS — G40209 Localization-related (focal) (partial) symptomatic epilepsy and epileptic syndromes with complex partial seizures, not intractable, without status epilepticus: Secondary | ICD-10-CM | POA: Diagnosis not present

## 2023-10-13 DIAGNOSIS — R296 Repeated falls: Secondary | ICD-10-CM | POA: Diagnosis present

## 2023-10-13 DIAGNOSIS — Z6834 Body mass index (BMI) 34.0-34.9, adult: Secondary | ICD-10-CM

## 2023-10-13 DIAGNOSIS — F32A Depression, unspecified: Secondary | ICD-10-CM | POA: Diagnosis present

## 2023-10-13 DIAGNOSIS — Z860100 Personal history of colon polyps, unspecified: Secondary | ICD-10-CM

## 2023-10-13 DIAGNOSIS — Z751 Person awaiting admission to adequate facility elsewhere: Secondary | ICD-10-CM

## 2023-10-13 DIAGNOSIS — I10 Essential (primary) hypertension: Secondary | ICD-10-CM | POA: Diagnosis present

## 2023-10-13 DIAGNOSIS — K219 Gastro-esophageal reflux disease without esophagitis: Secondary | ICD-10-CM | POA: Diagnosis present

## 2023-10-13 DIAGNOSIS — Z803 Family history of malignant neoplasm of breast: Secondary | ICD-10-CM

## 2023-10-13 DIAGNOSIS — E669 Obesity, unspecified: Secondary | ICD-10-CM | POA: Diagnosis present

## 2023-10-13 DIAGNOSIS — Z800 Family history of malignant neoplasm of digestive organs: Secondary | ICD-10-CM

## 2023-10-13 DIAGNOSIS — Z888 Allergy status to other drugs, medicaments and biological substances status: Secondary | ICD-10-CM

## 2023-10-13 DIAGNOSIS — Z885 Allergy status to narcotic agent status: Secondary | ICD-10-CM

## 2023-10-13 LAB — URINALYSIS, ROUTINE W REFLEX MICROSCOPIC
Glucose, UA: NEGATIVE mg/dL
Ketones, ur: 20 mg/dL — AB
Nitrite: POSITIVE — AB
Protein, ur: 100 mg/dL — AB
Specific Gravity, Urine: 1.027 (ref 1.005–1.030)
WBC, UA: >50 WBC/hpf (ref 0–5)
pH: 5 (ref 5.0–8.0)

## 2023-10-13 LAB — COMPREHENSIVE METABOLIC PANEL WITH GFR
ALT: 13 U/L (ref 0–44)
AST: 35 U/L (ref 15–41)
Albumin: 3.3 g/dL — ABNORMAL LOW (ref 3.5–5.0)
Alkaline Phosphatase: 69 U/L (ref 38–126)
Anion gap: 14 (ref 5–15)
BUN: 13 mg/dL (ref 8–23)
CO2: 25 mmol/L (ref 22–32)
Calcium: 9.9 mg/dL (ref 8.9–10.3)
Chloride: 97 mmol/L — ABNORMAL LOW (ref 98–111)
Creatinine, Ser: 0.77 mg/dL (ref 0.44–1.00)
GFR, Estimated: >60 mL/min (ref >60)
Glucose, Bld: 123 mg/dL — ABNORMAL HIGH (ref 70–99)
Potassium: 3.6 mmol/L (ref 3.5–5.1)
Sodium: 136 mmol/L (ref 135–145)
Total Bilirubin: 0.9 mg/dL (ref 0.0–1.2)
Total Protein: 6.8 g/dL (ref 6.5–8.1)

## 2023-10-13 LAB — CBC WITH DIFFERENTIAL/PLATELET
Abs Immature Granulocytes: 0.05 10*3/uL (ref 0.00–0.07)
Basophils Absolute: 0 10*3/uL (ref 0.0–0.1)
Basophils Relative: 0 %
Eosinophils Absolute: 0.1 10*3/uL (ref 0.0–0.5)
Eosinophils Relative: 1 %
HCT: 52.5 % — ABNORMAL HIGH (ref 36.0–46.0)
Hemoglobin: 17.1 g/dL — ABNORMAL HIGH (ref 12.0–15.0)
Immature Granulocytes: 1 %
Lymphocytes Relative: 9 %
Lymphs Abs: 1 10*3/uL (ref 0.7–4.0)
MCH: 30.1 pg (ref 26.0–34.0)
MCHC: 32.6 g/dL (ref 30.0–36.0)
MCV: 92.3 fL (ref 80.0–100.0)
Monocytes Absolute: 1.3 10*3/uL — ABNORMAL HIGH (ref 0.1–1.0)
Monocytes Relative: 11 %
Neutro Abs: 8.7 10*3/uL — ABNORMAL HIGH (ref 1.7–7.7)
Neutrophils Relative %: 78 %
Platelets: 292 10*3/uL (ref 150–400)
RBC: 5.69 MIL/uL — ABNORMAL HIGH (ref 3.87–5.11)
RDW: 15.7 % — ABNORMAL HIGH (ref 11.5–15.5)
WBC: 11.1 10*3/uL — ABNORMAL HIGH (ref 4.0–10.5)
nRBC: 0 % (ref 0.0–0.2)

## 2023-10-13 LAB — VALPROIC ACID LEVEL: Valproic Acid Lvl: 37 ug/mL — ABNORMAL LOW (ref 50–100)

## 2023-10-13 LAB — RAPID URINE DRUG SCREEN, HOSP PERFORMED
Amphetamines: NOT DETECTED
Barbiturates: NOT DETECTED
Benzodiazepines: NOT DETECTED
Cocaine: NOT DETECTED
Opiates: NOT DETECTED
Tetrahydrocannabinol: NOT DETECTED

## 2023-10-13 LAB — TROPONIN I (HIGH SENSITIVITY)
Troponin I (High Sensitivity): 7 ng/L (ref <18)
Troponin I (High Sensitivity): 8 ng/L (ref <18)

## 2023-10-13 MED ORDER — LORAZEPAM 2 MG/ML IJ SOLN: 2.0000 mg | INTRAMUSCULAR | Status: DC | PRN

## 2023-10-13 MED ORDER — PANTOPRAZOLE SODIUM 40 MG PO TBEC
40.0000 mg | DELAYED_RELEASE_TABLET | Freq: Every day | ORAL | Status: DC
  Administered 2023-10-13 – 2023-10-17 (×5): 40 mg via ORAL
  Filled 2023-10-13 (×5): qty 1

## 2023-10-13 MED ORDER — DIVALPROEX SODIUM ER 500 MG PO TB24
500.0000 mg | ORAL_TABLET | Freq: Every day | ORAL | Status: DC
Start: 2023-10-13 — End: 2023-10-17
  Administered 2023-10-13 – 2023-10-16 (×4): 500 mg via ORAL
  Filled 2023-10-13 (×4): qty 1

## 2023-10-13 MED ORDER — OXCARBAZEPINE 150 MG PO TABS: 150.0000 mg | ORAL_TABLET | Freq: Two times a day (BID) | ORAL | 3 refills | Status: DC

## 2023-10-13 MED ORDER — METOPROLOL SUCCINATE ER 50 MG PO TB24
75.0000 mg | ORAL_TABLET | Freq: Every day | ORAL | Status: DC
  Administered 2023-10-13 – 2023-10-17 (×5): 75 mg via ORAL
  Filled 2023-10-13 (×5): qty 1

## 2023-10-13 MED ORDER — ACETAMINOPHEN 325 MG PO TABS: 650.0000 mg | ORAL_TABLET | Freq: Four times a day (QID) | ORAL | Status: DC | PRN

## 2023-10-13 MED ORDER — SODIUM CHLORIDE 0.9 % IV SOLN
INTRAVENOUS | Status: AC
Start: 2023-10-13 — End: 2023-10-14

## 2023-10-13 MED ORDER — ACETAMINOPHEN 650 MG RE SUPP: 650.0000 mg | Freq: Four times a day (QID) | RECTAL | Status: DC | PRN

## 2023-10-13 MED ORDER — DIVALPROEX SODIUM ER 250 MG PO TB24: ORAL_TABLET | ORAL | 3 refills | Status: DC

## 2023-10-13 MED ORDER — OXCARBAZEPINE 150 MG PO TABS
150.0000 mg | ORAL_TABLET | Freq: Two times a day (BID) | ORAL | Status: DC
  Administered 2023-10-13 – 2023-10-17 (×8): 150 mg via ORAL
  Filled 2023-10-13 (×10): qty 1

## 2023-10-13 MED ORDER — ONDANSETRON HCL 4 MG/2ML IJ SOLN: 4.0000 mg | Freq: Four times a day (QID) | INTRAMUSCULAR | Status: DC | PRN

## 2023-10-13 MED ORDER — SIMVASTATIN 20 MG PO TABS
40.0000 mg | ORAL_TABLET | Freq: Every day | ORAL | Status: DC
  Administered 2023-10-13 – 2023-10-16 (×4): 40 mg via ORAL
  Filled 2023-10-13 (×4): qty 2

## 2023-10-13 MED ORDER — SODIUM CHLORIDE 0.9 % IV SOLN
2.0000 g | Freq: Once | INTRAVENOUS | Status: AC
  Administered 2023-10-13: 2 g via INTRAVENOUS
  Filled 2023-10-13: qty 20

## 2023-10-13 MED ORDER — APIXABAN 5 MG PO TABS
5.0000 mg | ORAL_TABLET | Freq: Two times a day (BID) | ORAL | Status: DC
  Administered 2023-10-13 – 2023-10-17 (×8): 5 mg via ORAL
  Filled 2023-10-13 (×8): qty 1

## 2023-10-13 MED ORDER — ONDANSETRON HCL 4 MG PO TABS: 4.0000 mg | ORAL_TABLET | Freq: Four times a day (QID) | ORAL | Status: DC | PRN

## 2023-10-13 MED ORDER — SODIUM CHLORIDE 0.9 % IV SOLN
2.0000 g | INTRAVENOUS | Status: DC
  Administered 2023-10-14: 2 g via INTRAVENOUS
  Filled 2023-10-13: qty 20

## 2023-10-13 NOTE — ED Provider Notes (Signed)
 [facility] AT [facility] Provider Note  CSN: [id number] Arrival date & time: 10/13/23 1245  Chief Complaint(s) Altered Mental Status  HPI Donna Barron is a 83 y.o. female with PMH seizures on Depakote  and oxcarbazepine , dementia, GERD, HTN, HLD who presents emergency room for evaluation of altered mental status.  Patient reportedly has had decrease in her functional and mental status over the last 2 to 3 weeks.  Difficulty walking with frequent falls.  Last fall 2 days ago.  Had a video visit with her neurologist this morning who had concern for persistent seizure behavior and sent the patient to the emergency department for further evaluation.  On arrival, patient is completely encephalopathic and unable to answer questions.  Will intermittently follow commands.  Additional history unable to be obtained due to patient's altered mental status.   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, without behavioral disturbance (HCC) 09/24/2021   Status post Nissen fundoplication Oct 2021 03/24/2020   Status post laparoscopic Nissen fundoplication 03/24/2020   Overactive bladder 12/04/2019   Dysphagia 09/11/2019   S/P left rotator cuff repair 06/15/18 06/15/2018   Nontraumatic complete tear of left rotator cuff    Class 2 severe obesity with serious comorbidity and body mass index (BMI) of 35.0 to 35.9 in adult (HCC) 05/23/2018   S/P carpal tunnel and DeQuervains release right 07/14/17 07/20/2017   Carpal tunnel syndrome of right wrist    De  Quervain's disease (tenosynovitis)    Essential hypertension 11/15/2013   Palpitations 06/28/2013   Mixed hyperlipidemia 06/28/2013   Obesity, unspecified 06/28/2013   Home Medication(s) Prior to Admission medications   Medication Sig Start Date End Date Taking? Authorizing Provider  apixaban  (ELIQUIS ) 5 MG TABS tablet Take 1 tablet (5 mg total) by mouth 2 (two) times daily. 08/29/23   Lasalle Pointer, NP  diazePAM , 20 MG Dose, (VALTOCO  20 MG DOSE) 2 x 10 MG/0.1ML LQPK Administer 1 spray in one nostril, 1 spray in other nostril (this is 1 dose) as needed for seizure. May give second dose after 4 hours if needed. Patient not taking: Reported on 10/13/2023 04/07/23   Jhonny Moss, MD  divalproex  (DEPAKOTE  ER) 250 MG 24 hr tablet Take 2 tablets every night 10/13/23   Aquino, Karen M, MD  escitalopram (LEXAPRO) 20 MG tablet Take 20 mg by mouth at bedtime.  01/16/17   [provider]  LORazepam  (ATIVAN ) 0.5 MG tablet Take 0.5 mg by mouth 2 (two) times daily as needed for anxiety. 12/04/19   [provider]  melatonin 5 MG TABS Take 5 mg by mouth.    [provider]  metoprolol  succinate (TOPROL -XL) 50 MG 24 hr tablet Take 1.5 tablets (75 mg total) by mouth daily. 08/29/23 02/25/24  Lasalle Pointer, NP  nystatin cream (MYCOSTATIN) Apply 1 Application topically 2 (two) times daily as needed. 12/17/21   [provider]  OXcarbazepine  (TRILEPTAL ) 150 MG tablet Take 1 tablet (150 mg total) by mouth 2 (  two) times daily. 10/13/23   Jhonny Moss, MD  pantoprazole  (PROTONIX ) 40 MG tablet Take 40 mg by mouth daily.     [provider]  simvastatin  (ZOCOR ) 40 MG tablet Take 1 tablet (40 mg total) by mouth at bedtime. 08/29/23   Lasalle Pointer, NP  Vitamin D, Ergocalciferol, (DRISDOL) 1.25 MG (50000 UNIT) CAPS capsule Take 50,000 Units by mouth once a week. 06/10/21   [provider]                                                                                                                                     Past Surgical History Past Surgical History:  Procedure Laterality Date   APPENDECTOMY     ARTHROSCOPIC REPAIR ACL  05/2008   Left knee   CARPAL TUNNEL RELEASE Right 07/14/2017   Procedure: CARPAL TUNNEL RELEASE;  Surgeon: Darrin Emerald, MD;  Location: [facility];  Service: Orthopedics;  Laterality: Right;   CATARACT EXTRACTION, BILATERAL  2020   CHOLECYSTECTOMY     DILATION AND CURETTAGE OF UTERUS     DORSAL COMPARTMENT RELEASE Right 07/14/2017   Procedure: RELEASE DORSAL COMPARTMENT (DEQUERVAIN);  Surgeon: Darrin Emerald, MD;  Location: [facility];  Service: Orthopedics;  Laterality: Right;   HIATAL HERNIA REPAIR N/A 03/24/2020   Procedure: LAPAROSCOPIC REPAIR OF HIATAL HERNIA, nissen fundoplication;  Surgeon: Jacolyn Matar, MD;  Location: [facility];  Service: General;  Laterality: N/A;   HYSTEROSCOPY WITH D & C N/A 12/25/2013   Procedure: DILATATION AND CURETTAGE /HYSTEROSCOPY;  Surgeon: Deann Exon, MD;  Location: [facility];  Service: Gynecology;  Laterality: N/A;   SHOULDER OPEN ROTATOR CUFF REPAIR Left 06/15/2018   Procedure: ROTATOR CUFF REPAIR SHOULDER OPEN WITH ACROMIOPLASTY;  Surgeon: Darrin Emerald, MD;  Location: [facility];  Service: Orthopedics;  Laterality: Left;   TUBAL LIGATION     Family History Family History  Problem Relation Age of Onset   Heart failure Mother 44   Lung cancer Father 75   Breast cancer Sister    Diabetes Sister    Colon cancer Brother    Colon polyps Son    Colon polyps Daughter     Social History Social History   Tobacco Use   Smoking status: Never   Smokeless tobacco: Never  Vaping Use   Vaping status: Never Used  Substance Use Topics   Alcohol use: Not Currently   Drug use: No   Allergies Hydrocodone , Avelox [moxifloxacin hcl in nacl], Codeine , and Oxybutynin  Review of Systems Review of Systems  Unable to perform ROS: Mental status change    Physical Exam Vital Signs  I have  reviewed the triage vital signs BP (!) 119/49   Pulse 88   Temp 97.9 F (36.6 C) (Oral)   Wt 86.2 kg   SpO2 92%   BMI 34.75 kg/m   Physical Exam Vitals and nursing note reviewed.  Constitutional:  General: She is not in acute distress.    Appearance: She is well-developed.  HENT:     Head: Normocephalic and atraumatic.  Eyes:     Conjunctiva/sclera: Conjunctivae normal.  Cardiovascular:     Rate and Rhythm: Normal rate and regular rhythm.     Heart sounds: No murmur heard. Pulmonary:     Effort: Pulmonary effort is normal. No respiratory distress.     Breath sounds: Normal breath sounds.  Abdominal:     Palpations: Abdomen is soft.     Tenderness: There is no abdominal tenderness.  Musculoskeletal:        General: No swelling.     Cervical back: Neck supple.  Skin:    General: Skin is warm and dry.     Capillary Refill: Capillary refill takes less than 2 seconds.  Neurological:     Mental Status: She is alert. She is disoriented.  Psychiatric:        Mood and Affect: Mood normal.     ED Results and Treatments Labs (all labs ordered are listed, but only abnormal results are displayed) Labs Reviewed  COMPREHENSIVE METABOLIC PANEL WITH GFR - Abnormal; Notable for the following components:      Result Value   Chloride 97 (*)    Glucose, Bld 123 (*)    Albumin 3.3 (*)    All other components within normal limits  CBC WITH DIFFERENTIAL/PLATELET - Abnormal; Notable for the following components:   WBC 11.1 (*)    RBC 5.69 (*)    Hemoglobin 17.1 (*)    HCT 52.5 (*)    RDW 15.7 (*)    Neutro Abs 8.7 (*)    Monocytes Absolute 1.3 (*)    All other components within normal limits  URINALYSIS, ROUTINE W REFLEX MICROSCOPIC - Abnormal; Notable for the following components:   Color, Urine AMBER (*)    APPearance CLOUDY (*)    Hgb urine dipstick MODERATE (*)    Bilirubin Urine SMALL (*)    Ketones, ur 20 (*)    Protein, ur 100 (*)    Nitrite POSITIVE (*)     Leukocytes,Ua SMALL (*)    Bacteria, UA MANY (*)    All other components within normal limits  VALPROIC ACID  LEVEL - Abnormal; Notable for the following components:   Valproic Acid  Lvl 37 (*)    All other components within normal limits  URINE CULTURE  RAPID URINE DRUG SCREEN, HOSP PERFORMED  10-HYDROXYCARBAZEPINE  TROPONIN I (HIGH SENSITIVITY)                                                                                                                          Radiology CT Head Wo Contrast Result Date: 10/13/2023 CLINICAL DATA:  83 year old female status post fall from bed. Altered mental status. EXAM: CT HEAD WITHOUT CONTRAST TECHNIQUE: Contiguous axial images were obtained from the base of the skull through the vertex without intravenous contrast. RADIATION DOSE REDUCTION: This exam was  performed according to the departmental dose-optimization program which includes automated exposure control, adjustment of the mA and/or kV according to patient size and/or use of iterative reconstruction technique. COMPARISON:  Brain MRI 05/12/2021.  Head CT 03/05/2023. FINDINGS: Brain: Stable cerebral volume since last year. No midline shift, ventriculomegaly, mass effect, evidence of mass lesion, intracranial hemorrhage or evidence of cortically based acute infarction. Stable gray-white differentiation, patchy moderate for age white matter hypodensity. Vascular: No suspicious intracranial vascular hyperdensity. Calcified atherosclerosis at the skull base. Skull: Appears stable and intact. Sinuses/Orbits: Widespread paranasal sinus opacification and low-density fluid levels appear to be inflammatory, new since last year. Tympanic cavities and mastoids remain clear. Other: No discrete orbit or scalp soft tissue injury identified. IMPRESSION: 1. No acute intracranial abnormality or acute traumatic injury identified. Chronic cerebral white matter disease. 2. Widespread paranasal sinus inflammation new since last  year, consider acute sinusitis. Electronically Signed   By: Marlise Simpers M.D.   On: 10/13/2023 13:55    Pertinent labs & imaging results that were available during my care of the patient were reviewed by me and considered in my medical decision making (see MDM for details).  Medications Ordered in ED Medications  cefTRIAXone  (ROCEPHIN ) 2 g in sodium chloride  0.9 % 100 mL IVPB (2 g Intravenous New Bag/Given 10/13/23 1355)                                                                                                                                     Procedures Procedures  (including critical care time)  Medical Decision Making / ED Course   This patient presents to the ED for concern of fall, altered mental status, this involves an extensive number of treatment options, and is a complaint that carries with it a high risk of complications and morbidity.  The differential diagnosis includes infection, metabolic/toxic encephalopathy, hypoglycemia, malperfusion, hypoxia, trauma or other intracranial process  MDM: Patient seen emergency room for evaluation of altered mental status.  Physical exam reveals an encephalopathic patient who is minimally responsive to commands, moving all 4 extremities but is staring off into space and not answering questions.  Given patient's anticoagulated status and recent falls, she was taken emergently for CT to rule out intracranial bleed which was reassuringly negative.  Laboratory evaluation with leukocytosis to 11.1, hemoglobin 17.1, urinalysis is concerning for infection and patient started on ceftriaxone .  Urine culture sent.  There is no previous micro data to guide antibiotic therapy.  Depakote  and oxcarbazepine  levels sent.  At this time, patient remains encephalopathic likely secondary to underlying UTI.  Will require hospital admission.  Patient admitted.  Additional history obtained:  -External records from outside source obtained and reviewed including:  Chart review including previous notes, labs, imaging, consultation notes   Lab Tests: -I ordered, reviewed, and interpreted labs.   The pertinent results include:   Labs Reviewed  COMPREHENSIVE METABOLIC PANEL WITH GFR - Abnormal; Notable  for the following components:      Result Value   Chloride 97 (*)    Glucose, Bld 123 (*)    Albumin 3.3 (*)    All other components within normal limits  CBC WITH DIFFERENTIAL/PLATELET - Abnormal; Notable for the following components:   WBC 11.1 (*)    RBC 5.69 (*)    Hemoglobin 17.1 (*)    HCT 52.5 (*)    RDW 15.7 (*)    Neutro Abs 8.7 (*)    Monocytes Absolute 1.3 (*)    All other components within normal limits  URINALYSIS, ROUTINE W REFLEX MICROSCOPIC - Abnormal; Notable for the following components:   Color, Urine AMBER (*)    APPearance CLOUDY (*)    Hgb urine dipstick MODERATE (*)    Bilirubin Urine SMALL (*)    Ketones, ur 20 (*)    Protein, ur 100 (*)    Nitrite POSITIVE (*)    Leukocytes,Ua SMALL (*)    Bacteria, UA MANY (*)    All other components within normal limits  VALPROIC ACID  LEVEL - Abnormal; Notable for the following components:   Valproic Acid  Lvl 37 (*)    All other components within normal limits  URINE CULTURE  RAPID URINE DRUG SCREEN, HOSP PERFORMED  10-HYDROXYCARBAZEPINE  TROPONIN I (HIGH SENSITIVITY)        Imaging Studies ordered: I ordered imaging studies including CT head I independently visualized and interpreted imaging. I agree with the radiologist interpretation   Medicines ordered and prescription drug management: Meds ordered this encounter  Medications   cefTRIAXone  (ROCEPHIN ) 2 g in sodium chloride  0.9 % 100 mL IVPB    Antibiotic Indication::   UTI    -I have reviewed the patients home medicines and have made adjustments as needed  Critical interventions none   Cardiac Monitoring: The patient was maintained on a cardiac monitor.  I personally viewed and interpreted the cardiac  monitored which showed an underlying rhythm of: NSR  Social Determinants of Health:  Factors impacting patients care include: none   Reevaluation: After the interventions noted above, I reevaluated the patient and found that they have :stayed the same  Co morbidities that complicate the patient evaluation  Past Medical History:  Diagnosis Date   Anxiety    Cataract    Colon polyps    Dementia (HCC)    Depression    Dysrhythmia    heart palipations   Gallstones    GERD (gastroesophageal reflux disease)    HTN (hypertension)    Hypercholesteremia    PONV (postoperative nausea and vomiting)       Dispostion: I considered admission for this patient, and patient require hospital mission for altered mental status in the setting of UTI     Final Clinical Impression(s) / ED Diagnoses Final diagnoses:  None     @PCDICTATION @    Karlyn Overman, MD 10/13/23 [phone]

## 2023-10-13 NOTE — ED Triage Notes (Signed)
 Pt daughter reports patient rolled out of bed and has not been following commands most of the morning and has a hx of partial complex seizures.  Daughter did a Architectural technologist with neurologist and was told this looked like seizure behavior.
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
## 2023-10-13 NOTE — Progress Notes (Signed)
   10/13/23 2111  TOC Brief Assessment  Insurance and Status Reviewed  Patient has primary care physician Yes  Home environment has been reviewed From home  Prior level of function: Assisted  Prior/Current Home Services No current home services  Social Drivers of Health Review SDOH reviewed no interventions necessary  Readmission risk has been reviewed Yes  Transition of care needs no transition of care needs at this time   Pt has rollator, shower seat, bsc, and a transport wc is on the way. Dr. Merita Staples office is coordinating [facility] RN and aide c/[employer]. Pt lives c/her daughter.

## 2023-10-13 NOTE — H&P (Signed)
 History and Physical    Donna Barron:[id number] DOB: 1941/04/10 DOA: 10/13/2023  PCP: Sallye Crease, NP   Patient coming from: Home  Chief Complaint: Confusion/AMS  HPI: Donna Barron is a 83 y.o. female with medical history significant for dementia, seizures, hypertension, dyslipidemia, GERD, and PAF on Eliquis  who presented to the ED at the urging of her neurologist for altered mental status.  She has apparently had a decrease in functional and mental status over the last 2-3 weeks.  She has also had difficulty walking with frequent falls.  She had a video visit with her neurologist Dr. Ty Gales this morning who was concerned about persistent subclinical seizures as well as with potential for UTI and therefore asked her to come to ED for further evaluation.  No other history of fever or chills or hematuria noted at home.   ED Course: Vital signs stable and patient afebrile.  Leukocytosis of 11,000 noted and hemoglobin 17.  CT head negative.  Urinalysis grossly positive for UTI and patient was started on Rocephin  empirically.  She is encephalopathic and unable to answer any questions.  No seizure-like activity witnessed.  Review of Systems: Could not be obtained given patient condition.  Past Medical History:  Diagnosis Date   Anxiety    Cataract    Colon polyps    Dementia (HCC)    Depression    Dysrhythmia    heart palipations   Gallstones    GERD (gastroesophageal reflux disease)    HTN (hypertension)    Hypercholesteremia    PONV (postoperative nausea and vomiting)     Past Surgical History:  Procedure Laterality Date   APPENDECTOMY     ARTHROSCOPIC REPAIR ACL  05/2008   Left knee   CARPAL TUNNEL RELEASE Right 07/14/2017   Procedure: CARPAL TUNNEL RELEASE;  Surgeon: Darrin Emerald, MD;  Location: [facility];  Service: Orthopedics;  Laterality: Right;   CATARACT EXTRACTION, BILATERAL  2020   CHOLECYSTECTOMY     DILATION AND CURETTAGE OF UTERUS     DORSAL  COMPARTMENT RELEASE Right 07/14/2017   Procedure: RELEASE DORSAL COMPARTMENT (DEQUERVAIN);  Surgeon: Darrin Emerald, MD;  Location: [facility];  Service: Orthopedics;  Laterality: Right;   HIATAL HERNIA REPAIR N/A 03/24/2020   Procedure: LAPAROSCOPIC REPAIR OF HIATAL HERNIA, nissen fundoplication;  Surgeon: Jacolyn Matar, MD;  Location: [facility];  Service: General;  Laterality: N/A;   HYSTEROSCOPY WITH D & C N/A 12/25/2013   Procedure: DILATATION AND CURETTAGE /HYSTEROSCOPY;  Surgeon: Deann Exon, MD;  Location: [facility];  Service: Gynecology;  Laterality: N/A;   SHOULDER OPEN ROTATOR CUFF REPAIR Left 06/15/2018   Procedure: ROTATOR CUFF REPAIR SHOULDER OPEN WITH ACROMIOPLASTY;  Surgeon: Darrin Emerald, MD;  Location: [facility];  Service: Orthopedics;  Laterality: Left;   TUBAL LIGATION       reports that she has never smoked. She has never used smokeless tobacco. She reports that she does not currently use alcohol. She reports that she does not use drugs.  Allergies  Allergen Reactions   Codeine  Swelling    Swelling of lips   Hydrocodone  Swelling    Lip swelling   Avelox [Moxifloxacin Hcl In Nacl] Other (See Comments)    Insomnia    Oxybutynin Palpitations and Other (See Comments)    Tongue felt coated     Family History  Problem Relation Age of Onset   Heart failure Mother 71   Lung cancer Father 31   Breast cancer Sister  Diabetes Sister    Colon cancer Brother    Colon polyps Son    Colon polyps Daughter     Prior to Admission medications   Medication Sig Start Date End Date Taking? Authorizing Provider  apixaban  (ELIQUIS ) 5 MG TABS tablet Take 1 tablet (5 mg total) by mouth 2 (two) times daily. 08/29/23   Lasalle Pointer, NP  diazePAM , 20 MG Dose, (VALTOCO  20 MG DOSE) 2 x 10 MG/0.1ML LQPK Administer 1 spray in one nostril, 1 spray in other nostril (this is 1 dose) as needed for seizure. May give second dose after 4 hours if needed. Patient not taking: Reported on 10/13/2023  04/07/23   Jhonny Moss, MD  divalproex  (DEPAKOTE  ER) 250 MG 24 hr tablet Take 2 tablets every night 10/13/23   Aquino, Karen M, MD  escitalopram (LEXAPRO) 20 MG tablet Take 20 mg by mouth at bedtime.  01/16/17   [provider]  LORazepam  (ATIVAN ) 0.5 MG tablet Take 0.5 mg by mouth 2 (two) times daily as needed for anxiety. 12/04/19   [provider]  melatonin 5 MG TABS Take 5 mg by mouth.    [provider]  metoprolol  succinate (TOPROL -XL) 50 MG 24 hr tablet Take 1.5 tablets (75 mg total) by mouth daily. 08/29/23 02/25/24  Lasalle Pointer, NP  nystatin cream (MYCOSTATIN) Apply 1 Application topically 2 (two) times daily as needed. 12/17/21   [provider]  OXcarbazepine  (TRILEPTAL ) 150 MG tablet Take 1 tablet (150 mg total) by mouth 2 (two) times daily. 10/13/23   Jhonny Moss, MD  pantoprazole  (PROTONIX ) 40 MG tablet Take 40 mg by mouth daily.     [provider]  simvastatin  (ZOCOR ) 40 MG tablet Take 1 tablet (40 mg total) by mouth at bedtime. 08/29/23   Lasalle Pointer, NP  Vitamin D, Ergocalciferol, (DRISDOL) 1.25 MG (50000 UNIT) CAPS capsule Take 50,000 Units by mouth once a week. 06/10/21   [provider]    Physical Exam: Vitals:   10/13/23 1300 10/13/23 1302 10/13/23 1500  BP: (!) 119/49  139/72  Pulse: 88  88  Resp:   (!) 21  Temp: 97.9 F (36.6 C)    TempSrc: Oral    SpO2: 92%  93%  Weight:  86.2 kg     Constitutional: NAD, calm, comfortable, confused, obese Vitals:   10/13/23 1300 10/13/23 1302 10/13/23 1500  BP: (!) 119/49  139/72  Pulse: 88  88  Resp:   (!) 21  Temp: 97.9 F (36.6 C)    TempSrc: Oral    SpO2: 92%  93%  Weight:  86.2 kg    Eyes: lids and conjunctivae normal Neck: normal, supple Respiratory: clear to auscultation bilaterally. Normal respiratory effort. No accessory muscle use.  Nasal cannula Cardiovascular: Regular rate and rhythm, no murmurs. Abdomen: no tenderness, no distention. Bowel  sounds positive.  Musculoskeletal:  No edema. Skin: no rashes, lesions, ulcers.  Psychiatric: Flat affect  Labs on Admission: I have personally reviewed following labs and imaging studies  CBC: Recent Labs  Lab 10/13/23 1340  WBC 11.1*  NEUTROABS 8.7*  HGB 17.1*  HCT 52.5*  MCV 92.3  PLT 292   Basic Metabolic Panel: Recent Labs  Lab 10/13/23 1340  NA 136  K 3.6  CL 97*  CO2 25  GLUCOSE 123*  BUN 13  CREATININE 0.77  CALCIUM 9.9   GFR: Estimated Creatinine Clearance: 55.2 mL/min (by C-G formula based on SCr of 0.77 mg/dL). Liver Function Tests:  Recent Labs  Lab 10/13/23 1340  AST 35  ALT 13  ALKPHOS 69  BILITOT 0.9  PROT 6.8  ALBUMIN 3.3*   No results for input(s): "LIPASE", "AMYLASE" in the last 168 hours. No results for input(s): "AMMONIA" in the last 168 hours. Coagulation Profile: No results for input(s): "INR", "PROTIME" in the last 168 hours. Cardiac Enzymes: No results for input(s): "CKTOTAL", "CKMB", "CKMBINDEX", "TROPONINI" in the last 168 hours. BNP (last 3 results) No results for input(s): "PROBNP" in the last 8760 hours. HbA1C: No results for input(s): "HGBA1C" in the last 72 hours. CBG: No results for input(s): "GLUCAP" in the last 168 hours. Lipid Profile: No results for input(s): "CHOL", "HDL", "LDLCALC", "TRIG", "CHOLHDL", "LDLDIRECT" in the last 72 hours. Thyroid Function Tests: No results for input(s): "TSH", "T4TOTAL", "FREET4", "T3FREE", "THYROIDAB" in the last 72 hours. Anemia Panel: No results for input(s): "VITAMINB12", "FOLATE", "FERRITIN", "TIBC", "IRON", "RETICCTPCT" in the last 72 hours. Urine analysis:    Component Value Date/Time   COLORURINE AMBER (A) 10/13/2023 1314   APPEARANCEUR CLOUDY (A) 10/13/2023 1314   LABSPEC 1.027 10/13/2023 1314   PHURINE 5.0 10/13/2023 1314   GLUCOSEU NEGATIVE 10/13/2023 1314   HGBUR MODERATE (A) 10/13/2023 1314   BILIRUBINUR SMALL (A) 10/13/2023 1314   KETONESUR 20 (A) 10/13/2023 1314    PROTEINUR 100 (A) 10/13/2023 1314   NITRITE POSITIVE (A) 10/13/2023 1314   LEUKOCYTESUR SMALL (A) 10/13/2023 1314    Radiological Exams on Admission: CT Head Wo Contrast Result Date: 10/13/2023 CLINICAL DATA:  83 year old female status post fall from bed. Altered mental status. EXAM: CT HEAD WITHOUT CONTRAST TECHNIQUE: Contiguous axial images were obtained from the base of the skull through the vertex without intravenous contrast. RADIATION DOSE REDUCTION: This exam was performed according to the departmental dose-optimization program which includes automated exposure control, adjustment of the mA and/or kV according to patient size and/or use of iterative reconstruction technique. COMPARISON:  Brain MRI 05/12/2021.  Head CT 03/05/2023. FINDINGS: Brain: Stable cerebral volume since last year. No midline shift, ventriculomegaly, mass effect, evidence of mass lesion, intracranial hemorrhage or evidence of cortically based acute infarction. Stable gray-white differentiation, patchy moderate for age white matter hypodensity. Vascular: No suspicious intracranial vascular hyperdensity. Calcified atherosclerosis at the skull base. Skull: Appears stable and intact. Sinuses/Orbits: Widespread paranasal sinus opacification and low-density fluid levels appear to be inflammatory, new since last year. Tympanic cavities and mastoids remain clear. Other: No discrete orbit or scalp soft tissue injury identified. IMPRESSION: 1. No acute intracranial abnormality or acute traumatic injury identified. Chronic cerebral white matter disease. 2. Widespread paranasal sinus inflammation new since last year, consider acute sinusitis. Electronically Signed   By: Marlise Simpers M.D.   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,  without behavioral disturbance (HCC)    Acute metabolic encephalopathy likely secondary to UTI versus subclinical seizures - Avoid neurologic agents aside from seizure medications at this time and continue to monitor closely with seizure precautions - Treat UTI with Rocephin  and monitor cultures - Hold Lexapro and Ativan   Hypertension - Continue metoprolol   Dyslipidemia - Continue statin  PAF on Eliquis  - Continue anticoagulation with Eliquis -CT head negative -Rate control with metoprolol  - Continue telemetry monitoring  History of dementia - Monitor for any signs of agitation/behavioral disturbances  GERD - PPI  Obesity,  class I - BMI 34.75  DVT prophylaxis: Eliquis  Code Status: Full Family Communication:  Disposition Plan: Admit for treatment of UTI and AMS Consults called: None Admission status: Inpatient, stepdown  Severity of Illness: The appropriate patient status for this patient is INPATIENT. Inpatient status is judged to be reasonable and necessary in order to provide the required intensity of service to ensure the patient's safety. The patient's presenting symptoms, physical exam findings, and initial radiographic and laboratory data in the context of their chronic comorbidities is felt to place them at high risk for further clinical deterioration. Furthermore, it is not anticipated that the patient will be medically stable for discharge from the hospital within 2 midnights of admission.   * I certify that at the point of admission it is my clinical judgment that the patient will require inpatient hospital care spanning beyond 2 midnights from the point of admission due to high intensity of service, high risk for further deterioration and high frequency of surveillance required.*   Maxtyn Nuzum D Soo Steelman DO [facility]  If 7PM-7AM, please contact night-coverage [email or web address]  10/13/2023, 3:05 PM

## 2023-10-13 NOTE — ED Notes (Signed)
 Pt to CT at this time.

## 2023-10-14 DIAGNOSIS — G9341 Metabolic encephalopathy: Secondary | ICD-10-CM | POA: Diagnosis not present

## 2023-10-14 LAB — CBC
HCT: 46.6 % — ABNORMAL HIGH (ref 36.0–46.0)
Hemoglobin: 14.7 g/dL (ref 12.0–15.0)
MCH: 29.3 pg (ref 26.0–34.0)
MCHC: 31.5 g/dL (ref 30.0–36.0)
MCV: 92.8 fL (ref 80.0–100.0)
Platelets: 242 10*3/uL (ref 150–400)
RBC: 5.02 MIL/uL (ref 3.87–5.11)
RDW: 15.7 % — ABNORMAL HIGH (ref 11.5–15.5)
WBC: 9.7 10*3/uL (ref 4.0–10.5)
nRBC: 0 % (ref 0.0–0.2)

## 2023-10-14 LAB — URINALYSIS, W/ REFLEX TO CULTURE (INFECTION SUSPECTED)
Bilirubin Urine: NEGATIVE
Glucose, UA: NEGATIVE mg/dL
Ketones, ur: 5 mg/dL — AB
Nitrite: POSITIVE — AB
Protein, ur: 100 mg/dL — AB
Specific Gravity, Urine: 1.027 (ref 1.005–1.030)
WBC, UA: >50 WBC/hpf (ref 0–5)
pH: 5 (ref 5.0–8.0)

## 2023-10-14 LAB — BASIC METABOLIC PANEL WITH GFR
Anion gap: 11 (ref 5–15)
BUN: 13 mg/dL (ref 8–23)
CO2: 26 mmol/L (ref 22–32)
Calcium: 8.9 mg/dL (ref 8.9–10.3)
Chloride: 100 mmol/L (ref 98–111)
Creatinine, Ser: 0.61 mg/dL (ref 0.44–1.00)
GFR, Estimated: >60 mL/min (ref >60)
Glucose, Bld: 105 mg/dL — ABNORMAL HIGH (ref 70–99)
Potassium: 3.4 mmol/L — ABNORMAL LOW (ref 3.5–5.1)
Sodium: 137 mmol/L (ref 135–145)

## 2023-10-14 LAB — MAGNESIUM: Magnesium: 1.4 mg/dL — ABNORMAL LOW (ref 1.7–2.4)

## 2023-10-14 MED ORDER — POTASSIUM CHLORIDE CRYS ER 20 MEQ PO TBCR
40.0000 meq | EXTENDED_RELEASE_TABLET | Freq: Two times a day (BID) | ORAL | Status: AC
Start: 2023-10-14 — End: 2023-10-14
  Administered 2023-10-14 (×2): 40 meq via ORAL
  Filled 2023-10-14 (×2): qty 2

## 2023-10-14 MED ORDER — MAGNESIUM SULFATE 2 GM/50ML IV SOLN
2.0000 g | Freq: Once | INTRAVENOUS | Status: AC
  Administered 2023-10-14: 2 g via INTRAVENOUS
  Filled 2023-10-14: qty 50

## 2023-10-14 NOTE — TOC Initial Note (Signed)
 Transition of Care [facility]) - Initial/Assessment Note    Patient Details  Name: Donna Barron MRN: [id number] Date of Birth: [birth year]  Transition of Care [facility]) CM/SW Contact:    Orelia Binet, RN Phone Number: 10/14/2023, 12:58 PM  Clinical Narrative:    Patient admitted with acute metabolic encephalopathy. Patient sleeping, no visitors at the bedside, CM called her daughter. Patient lives with family, she has all equipment needs, they assist a lot due to her weakness. PT is recommending SNF. Daughter wants to talk with family to see if they can continue to handle her care or if she needs to try SNF for short term before returning home. TOC following. Patient will in the hospital 2-3 more days.                 Barriers to Discharge: Continued Medical Work up   Patient Goals and CMS Choice Patient states their goals for this hospitalization and ongoing recovery are:: to get stronger CMS Medicare.gov Compare Post Acute Care list provided to:: Patient Represenative (must comment)       Expected Discharge Plan and Services       Living arrangements for the past 2 months: Single Family Home                      Prior Living Arrangements/Services Living arrangements for the past 2 months: Single Family Home Lives with:: Adult Children             Current home services: DME    Activities of Daily Living   ADL Screening (condition at time of admission) Independently performs ADLs?: No Does the patient have a NEW difficulty with bathing/dressing/toileting/self-feeding that is expected to last >3 days?: Yes (Initiates electronic notice to provider for possible OT consult) Does the patient have a NEW difficulty with getting in/out of bed, walking, or climbing stairs that is expected to last >3 days?: Yes (Initiates electronic notice to provider for possible PT consult) Does the patient have a NEW difficulty with communication that is expected to last >3 days?: Yes (Initiates  electronic notice to provider for possible SLP consult) Is the patient deaf or have difficulty hearing?: No Does the patient have difficulty seeing, even when wearing glasses/contacts?: No Does the patient have difficulty concentrating, remembering, or making decisions?: Yes     Emotional Assessment       Orientation: : Oriented to Self Alcohol / Substance Use: Not Applicable Psych Involvement: No (comment)  Admission diagnosis:  Lower urinary tract infectious disease [N39.0] Altered mental status, unspecified altered mental status type [R41.82] Acute metabolic encephalopathy [G93.41] Patient Active Problem List   Diagnosis Date Noted   Acute metabolic encephalopathy 10/13/2023   Memory loss 09/24/2021   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, without behavioral disturbance (HCC) 09/24/2021   Status post Nissen fundoplication Oct 2021 03/24/2020   Status post laparoscopic Nissen fundoplication 03/24/2020   Overactive bladder 12/04/2019   Dysphagia 09/11/2019   S/P left rotator cuff repair 06/15/18 06/15/2018   Nontraumatic complete tear of left rotator cuff    Class 2 severe obesity with serious comorbidity and body mass index (BMI) of 35.0 to 35.9 in adult (HCC) 05/23/2018   S/P carpal tunnel and DeQuervains release right 07/14/17 07/20/2017   Carpal tunnel syndrome of right wrist    De Quervain's disease (tenosynovitis)    Essential hypertension 11/15/2013   Palpitations 06/28/2013   Mixed hyperlipidemia 06/28/2013   Obesity, unspecified 06/28/2013  PCP:  Sallye Crease, NP Pharmacy:   [facility] [address] [place] - [phone] [place] [address] Phone: [phone] Fax: [phone]     Social Drivers of Health (SDOH) Social History: SDOH Screenings   Food Insecurity: No Food Insecurity (10/13/2023)  Housing: Low Risk  (10/13/2023)  Transportation Needs: No Transportation Needs (10/13/2023)  Utilities:  Not At Risk (10/13/2023)  Financial Resource Strain: Low Risk  (08/11/2023)   Received from [facility]  Physical Activity: Unknown (01/13/2023)   Received from [facility]  Social Connections: Patient Unable To Answer (10/13/2023)  Stress: No Stress Concern Present (01/13/2023)   Received from [facility]  Tobacco Use: Low Risk  (10/13/2023)   SDOH Interventions:     Readmission Risk Interventions    10/13/2023    9:11 PM  Readmission Risk Prevention Plan  Post Dischage Appt Complete  Medication Screening Complete  Transportation Screening Complete

## 2023-10-14 NOTE — Evaluation (Signed)
 Physical Therapy Evaluation Patient Details Name: Donna Barron MRN: [id number] DOB: [birth year] Today's Date: 10/14/2023  History of Present Illness  Donna Barron is a 83 y.o. female with medical history significant for dementia, seizures, hypertension, dyslipidemia, GERD, and PAF on Eliquis  who presented to the ED at the urging of her neurologist for altered mental status.  She has apparently had a decrease in functional and mental status over the last 2-3 weeks.  She has also had difficulty walking with frequent falls.  She had a video visit with her neurologist Dr. Ty Gales this morning who was concerned about persistent subclinical seizures as well as with potential for UTI and therefore asked her to come to ED for further evaluation.  No other history of fever or chills or hematuria noted at home.   Clinical Impression  Patient demonstrates slow labored movement for sitting up at bedside, requires repeated verbal/tactile cueing for following directions, limited to a few side steps before having to sit due to loss of balance, weakness. Patient tolerated sitting up in chair after therapy. Patient will benefit from continued skilled physical therapy in hospital and recommended venue below to increase strength, balance, endurance for safe ADLs and gait.           If plan is discharge home, recommend the following: A lot of help with bathing/dressing/bathroom;A lot of help with walking and/or transfers;Help with stairs or ramp for entrance;Assistance with cooking/housework   Can travel by private vehicle   No    Equipment Recommendations None recommended by PT  Recommendations for Other Services       Functional Status Assessment Patient has had a recent decline in their functional status and demonstrates the ability to make significant improvements in function in a reasonable and predictable amount of time.     Precautions / Restrictions Precautions Precautions: Fall Recall of  Precautions/Restrictions: Impaired Restrictions Weight Bearing Restrictions Per Provider Order: No      Mobility  Bed Mobility Overal bed mobility: Needs Assistance Bed Mobility: Supine to Sit     Supine to sit: Max assist     General bed mobility comments: increased time, labored movement, requires repeated verbal/tactile cueing    Transfers Overall transfer level: Needs assistance Equipment used: Rolling walker (2 wheels) Transfers: Sit to/from Stand, Bed to chair/wheelchair/BSC Sit to Stand: Max assist Stand pivot transfers: Max assist         General transfer comment: unsteady on feet with poor tolerance for standing    Ambulation/Gait Ambulation/Gait assistance: Max assist Gait Distance (Feet): 2 Feet Assistive device: Rolling walker (2 wheels) Gait Pattern/deviations: Decreased step length - right, Decreased step length - left, Decreased stride length Gait velocity: slow     General Gait Details: limited to a couple of side steps before having to sit due to fall risk  Stairs            Wheelchair Mobility     Tilt Bed    Modified Rankin (Stroke Patients Only)       Balance Overall balance assessment: Needs assistance Sitting-balance support: Feet supported, No upper extremity supported Sitting balance-Leahy Scale: Fair Sitting balance - Comments: seated at EOB   Standing balance support: Bilateral upper extremity supported, During functional activity, Reliant on assistive device for balance Standing balance-Leahy Scale: Poor Standing balance comment: using RW                             Pertinent  Vitals/Pain Pain Assessment Pain Assessment: No/denies pain    Home Living Family/patient expects to be discharged to:: Private residence Living Arrangements: Other relatives Available Help at Discharge: Family Type of Home: House Home Access: Stairs to enter Entrance Stairs-Rails: Can reach both;Left;Right Entrance  Stairs-Number of Steps: 10   Home Layout: Two level;Able to live on main level with bedroom/bathroom Home Equipment: BSC/3in1;Wheelchair - manual Additional Comments: Information per patient who did not appear to be a good historian.    Prior Function Prior Level of Function : Independent/Modified Independent             Mobility Comments: Pt reports independent ambulation without AD. ADLs Comments: Pt reports independence.     Extremity/Trunk Assessment   Upper Extremity Assessment Upper Extremity Assessment: Defer to OT evaluation RUE Deficits / Details: 3-/5 shoulder flexion; Generally weak otherwise. LUE Deficits / Details: 2-/5 shoulder flexion; pt reports previous shoulder injury. Generally weak otherwise.    Lower Extremity Assessment Lower Extremity Assessment: Generalized weakness    Cervical / Trunk Assessment Cervical / Trunk Assessment: Kyphotic  Communication   Communication Communication: No apparent difficulties    Cognition Arousal: Alert Behavior During Therapy: WFL for tasks assessed/performed, Flat affect   PT - Cognitive impairments: Initiation, Attention                         Following commands: Impaired Following commands impaired: Follows one step commands with increased time     Cueing Cueing Techniques: Verbal cues, Tactile cues     General Comments      Exercises     Assessment/Plan    PT Assessment Patient needs continued PT services  PT Problem List Decreased strength;Decreased activity tolerance;Decreased balance;Decreased mobility       PT Treatment Interventions DME instruction;Gait training;Stair training;Functional mobility training;Therapeutic activities;Therapeutic exercise;Balance training;Patient/family education    PT Goals (Current goals can be found in the Care Plan section)  Acute Rehab PT Goals Patient Stated Goal: return home PT Goal Formulation: With patient/family Time For Goal Achievement:  10/28/23 Potential to Achieve Goals: Good    Frequency Min 3X/week     Co-evaluation PT/OT/SLP Co-Evaluation/Treatment: Yes Reason for Co-Treatment: To address functional/ADL transfers PT goals addressed during session: Mobility/safety with mobility;Balance;Proper use of DME OT goals addressed during session: ADL's and self-care       AM-PAC PT "6 Clicks" Mobility  Outcome Measure Help needed turning from your back to your side while in a flat bed without using bedrails?: A Lot Help needed moving from lying on your back to sitting on the side of a flat bed without using bedrails?: A Lot Help needed moving to and from a bed to a chair (including a wheelchair)?: A Lot Help needed standing up from a chair using your arms (e.g., wheelchair or bedside chair)?: A Lot Help needed to walk in hospital room?: A Lot Help needed climbing 3-5 steps with a railing? : Total 6 Click Score: 11    End of Session   Activity Tolerance: Patient tolerated treatment well;Patient limited by fatigue Patient left: in chair;with call bell/phone within reach;with chair alarm set Nurse Communication: Mobility status PT Visit Diagnosis: Unsteadiness on feet (R26.81);Other abnormalities of gait and mobility (R26.89);Muscle weakness (generalized) (M62.81)    Time: 1610-9604 PT Time Calculation (min) (ACUTE ONLY): 15 min   Charges:   PT Evaluation $PT Eval Low Complexity: 1 Low PT Treatments $Therapeutic Activity: 8-22 mins PT General Charges $$ ACUTE PT VISIT: 1  Visit         3:13 PM, 10/14/23 Walton Guppy, MPT Physical Therapist with [facility] [phone] office [phone] mobile phone

## 2023-10-14 NOTE — Plan of Care (Signed)
  Problem: Acute Rehab PT Goals(only PT should resolve) Goal: Pt Will Go Supine/Side To Sit Outcome: Progressing Flowsheets (Taken 10/14/2023 1514) Pt will go Supine/Side to Sit:  with minimal assist  with moderate assist Goal: Patient Will Perform Sitting Balance Outcome: Progressing Flowsheets (Taken 10/14/2023 1514) Patient will perform sitting balance:  with minimal assist  with moderate assist Goal: Pt Will Transfer Bed To Chair/Chair To Bed Outcome: Progressing Flowsheets (Taken 10/14/2023 1514) Pt will Transfer Bed to Chair/Chair to Bed:  with min assist  with mod assist Goal: Pt Will Ambulate Outcome: Progressing Flowsheets (Taken 10/14/2023 1514) Pt will Ambulate:  25 feet  with minimal assist  with moderate assist  with rolling walker   3:15 PM, 10/14/23 Walton Guppy, MPT Physical Therapist with [facility] 336 [phone] office [phone] mobile phone

## 2023-10-14 NOTE — Evaluation (Signed)
 Occupational Therapy Evaluation Patient Details Name: Donna Barron MRN: [id number] DOB: Apr 10, 1941 Today's Date: 10/14/2023   History of Present Illness   Donna Barron is a 83 y.o. female with medical history significant for dementia, seizures, hypertension, dyslipidemia, GERD, and PAF on Eliquis  who presented to the ED at the urging of her neurologist for altered mental status.  She has apparently had a decrease in functional and mental status over the last 2-3 weeks.  She has also had difficulty walking with frequent falls.  She had a video visit with her neurologist Dr. Ty Gales this morning who was concerned about persistent subclinical seizures as well as with potential for UTI and therefore asked her to come to ED for further evaluation.  No other history of fever or chills or hematuria noted at home. (per DO)     Clinical Impressions Pt agreeable to OT and PT co-evaluation. No family present to confirm living set up and history. Pt was not oriented to anything but self today. Pt able to follow commands with verbal and tactile cuing. B UE limited A/ROM and strength. Max A needed for bed mobility and transfer to chair from bed. Max A for lower body ADL's and at least min A for upper body dressing tasks. Pt left in the chair with call bell within reach and chair alarm set. Pt will benefit from continued OT in the hospital and recommended venue below to increase strength, balance, and endurance for safe ADL's.        If plan is discharge home, recommend the following:   A lot of help with walking and/or transfers;A lot of help with bathing/dressing/bathroom;Assistance with cooking/housework;Assist for transportation;Direct supervision/assist for medications management;Help with stairs or ramp for entrance     Functional Status Assessment   Patient has had a recent decline in their functional status and demonstrates the ability to make significant improvements in function in a reasonable  and predictable amount of time.     Equipment Recommendations   None recommended by OT              Precautions/Restrictions   Precautions Precautions: Fall Recall of Precautions/Restrictions: Impaired Restrictions Weight Bearing Restrictions Per Provider Order: No     Mobility Bed Mobility Overal bed mobility: Needs Assistance Bed Mobility: Supine to Sit     Supine to sit: Max assist     General bed mobility comments: much assist; labored movement    Transfers Overall transfer level: Needs assistance Equipment used: Rolling walker (2 wheels) Transfers: Sit to/from Stand, Bed to chair/wheelchair/BSC Sit to Stand: Max assist Stand pivot transfers: Max assist         General transfer comment: Able to stand with RW ~2 reps with max A. Graded to SPT without AD due to poor balance.      Balance Overall balance assessment: Needs assistance Sitting-balance support: Feet supported, No upper extremity supported Sitting balance-Leahy Scale: Fair Sitting balance - Comments: seated at EOB   Standing balance support: Bilateral upper extremity supported, During functional activity, Reliant on assistive device for balance Standing balance-Leahy Scale: Poor Standing balance comment: using RW                           ADL either performed or assessed with clinical judgement   ADL Overall ADL's : Needs assistance/impaired Eating/Feeding: Modified independent;Sitting   Grooming: Minimal assistance;Sitting   Upper Body Bathing: Moderate assistance;Sitting   Lower Body Bathing: Maximal assistance;Sitting/lateral leans  Upper Body Dressing : Moderate assistance;Sitting   Lower Body Dressing: Maximal assistance;Sitting/lateral leans Lower Body Dressing Details (indicate cue type and reason): Assited to don socks seated at EOB. Toilet Transfer: Maximal Cabin crew Details (indicate cue type and reason): Simulated via EOB to  chair transfer. Toileting- Clothing Manipulation and Hygiene: Maximal assistance;Bed level               Vision Baseline Vision/History:  (none reported) Patient Visual Report:  (unsure of baseline vision) Vision Assessment?: Yes Additional Comments: Mild difficulty with diagonal tracking R to L.     Perception Perception: Not tested       Praxis Praxis: Not tested       Pertinent Vitals/Pain Pain Assessment Pain Assessment: No/denies pain     Extremity/Trunk Assessment Upper Extremity Assessment Upper Extremity Assessment: RUE deficits/detail;LUE deficits/detail RUE Deficits / Details: 3-/5 shoulder flexion; Generally weak otherwise. LUE Deficits / Details: 2-/5 shoulder flexion; pt reports previous shoulder injury. Generally weak otherwise.   Lower Extremity Assessment Lower Extremity Assessment: Defer to PT evaluation   Cervical / Trunk Assessment Cervical / Trunk Assessment: Kyphotic   Communication Communication Communication: No apparent difficulties   Cognition Arousal: Alert Behavior During Therapy: WFL for tasks assessed/performed Cognition: History of cognitive impairments             OT - Cognition Comments: Pt oriented to self only.                 Following commands: Intact       Cueing  General Comments   Cueing Techniques: Verbal cues;Tactile cues                 Home Living Family/patient expects to be discharged to:: Private residence Living Arrangements: Other relatives Available Help at Discharge: Family Type of Home: House Home Access: Stairs to enter [employer] of Steps: 10 Entrance Stairs-Rails: Can reach both;Left;Right Home Layout: Two level;Able to live on main level with bedroom/bathroom     Bathroom Shower/Tub: Producer, television/film/video: Standard Bathroom Accessibility: Yes How Accessible: Accessible via wheelchair;Accessible via walker Home Equipment: BSC/3in1;Wheelchair -  manual   Additional Comments: Information per patient who did not appear to be a good historian.      Prior Functioning/Environment Prior Level of Function : Independent/Modified Independent             Mobility Comments: Pt reports independent ambulation without AD. ADLs Comments: Pt reports independence.    OT Problem List: Decreased strength;Decreased range of motion;Decreased activity tolerance;Impaired balance (sitting and/or standing);Decreased cognition;Obesity   OT Treatment/Interventions: Self-care/ADL training;Therapeutic exercise;DME and/or AE instruction;Therapeutic activities;Cognitive remediation/compensation;Patient/family education;Balance training      OT Goals(Current goals can be found in the care plan section)   Acute Rehab OT Goals Patient Stated Goal: improve function OT Goal Formulation: With patient Time For Goal Achievement: 10/28/23 Potential to Achieve Goals: Good   OT Frequency:  Min 2X/week    Co-evaluation PT/OT/SLP Co-Evaluation/Treatment: Yes Reason for Co-Treatment: To address functional/ADL transfers   OT goals addressed during session: ADL's and self-care                       End of Session Equipment Utilized During Treatment: Rolling walker (2 wheels)  Activity Tolerance: Patient tolerated treatment well Patient left: in chair;with call bell/phone within reach;with chair alarm set  OT Visit Diagnosis: Unsteadiness on feet (R26.81);Other abnormalities of gait and mobility (R26.89);Muscle weakness (generalized) (M62.81);Other symptoms and signs  involving cognitive function                Time: 4742-5956 OT Time Calculation (min): 15 min Charges:  OT General Charges $OT Visit: 1 Visit OT Evaluation $OT Eval Low Complexity: 1 Low  Rocklyn Mayberry OT, MOT   Thurnell Floss 10/14/2023, 1:01 PM

## 2023-10-14 NOTE — Progress Notes (Signed)
 PROGRESS NOTE    DACEY BEERMAN  ZOX:[id number] DOB: 01-08-1941 DOA: 10/13/2023 PCP: Sallye Crease, NP   Brief Narrative:    Donna Barron is a 83 y.o. female with medical history significant for dementia, seizures, hypertension, dyslipidemia, GERD, and PAF on Eliquis  who presented to the ED at the urging of her neurologist for altered mental status.  She has apparently had a decrease in functional and mental status over the last 2-3 weeks.  She was admitted with acute metabolic encephalopathy secondary to UTI versus subclinical seizures; her mentation appears to be improving.  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, without behavioral disturbance (HCC)  Assessment and Plan:   Acute metabolic encephalopathy likely secondary to UTI versus subclinical seizures - Avoid neurologic agents aside from seizure medications at this time and continue to monitor closely with seizure precautions - Treat UTI with Rocephin  and monitor cultures - Hold Lexapro and Ativan  -PT/OT evaluation ordered and pending   Hypertension - Continue metoprolol   Hypokalemia/hypomagnesemia -Replete and reevaluate   Dyslipidemia - Continue statin   PAF on Eliquis  - Continue anticoagulation with Eliquis -CT head negative -Rate control with metoprolol  - Continue telemetry monitoring   History of dementia - Monitor for any signs of agitation/behavioral disturbances   GERD - PPI   Obesity, class I - BMI 34.75   DVT prophylaxis:apixaban  Code Status: Full Family Communication: Daughter at bedside 5/1 Disposition Plan:  Status is: Inpatient Remains inpatient appropriate because: Need for IV medications.   Consultants:  None  Procedures:  None  Antimicrobials:  Anti-infectives (From admission,  onward)    Start     Dose/Rate Route Frequency Ordered Stop   10/14/23 1000  cefTRIAXone  (ROCEPHIN ) 2 g in sodium chloride  0.9 % 100 mL IVPB        2 g 200 mL/hr over 30 Minutes Intravenous Every 24 hours 10/13/23 1512     10/13/23 1345  cefTRIAXone  (ROCEPHIN ) 2 g in sodium chloride  0.9 % 100 mL IVPB        2 g 200 mL/hr over 30 Minutes Intravenous  Once 10/13/23 1332 10/13/23 1425       Subjective: Patient seen and evaluated today with no new acute complaints or concerns. No acute concerns or events noted overnight.  She appears to be more alert and awake and responsive to questioning this a.m.  Objective: Vitals:   10/13/23 1711 10/13/23 2044 10/14/23 0049 10/14/23 0521  BP:  (!) 156/73 123/64 112/84  Pulse:  85 82 77  Resp:  20 20 19   Temp:  98.1 F (36.7 C) 98.3 F (36.8 C) 98.9 F (37.2 C)  TempSrc:  Oral  Oral  SpO2:  93% 93% 95%  Weight: 84.1 kg     Height: 5\' 2"  (1.575 m)       Intake/Output Summary (Last 24 hours) at 10/14/2023 0730 Last data filed at 10/14/2023 0606 Gross per 24 hour  Intake 1127.98 ml  Output 400 ml  Net 727.98 ml   Filed Weights   10/13/23 1302 10/13/23 1711  Weight: 86.2 kg 84.1 kg    Examination:  General exam: Appears calm and comfortable  Respiratory system: Clear to auscultation. Respiratory effort normal. Cardiovascular system: S1 & S2 heard, RRR.  Gastrointestinal system: Abdomen is soft Central nervous system: Alert and awake Extremities: No edema Skin:  No significant lesions noted Psychiatry: Flat affect.    Data Reviewed: I have personally reviewed following labs and imaging studies  CBC: Recent Labs  Lab 10/13/23 1340 10/14/23 0512  WBC 11.1* 9.7  NEUTROABS 8.7*  --   HGB 17.1* 14.7  HCT 52.5* 46.6*  MCV 92.3 92.8  PLT 292 242   Basic Metabolic Panel: Recent Labs  Lab 10/13/23 1340 10/14/23 0512  NA 136 137  K 3.6 3.4*  CL 97* 100  CO2 25 26  GLUCOSE 123* 105*  BUN 13 13  CREATININE 0.77 0.61   CALCIUM 9.9 8.9  MG  --  1.4*   GFR: Estimated Creatinine Clearance: 54.5 mL/min (by C-G formula based on SCr of 0.61 mg/dL). Liver Function Tests: Recent Labs  Lab 10/13/23 1340  AST 35  ALT 13  ALKPHOS 69  BILITOT 0.9  PROT 6.8  ALBUMIN 3.3*   No results for input(s): "LIPASE", "AMYLASE" in the last 168 hours. No results for input(s): "AMMONIA" in the last 168 hours. Coagulation Profile: No results for input(s): "INR", "PROTIME" in the last 168 hours. Cardiac Enzymes: No results for input(s): "CKTOTAL", "CKMB", "CKMBINDEX", "TROPONINI" in the last 168 hours. BNP (last 3 results) No results for input(s): "PROBNP" in the last 8760 hours. HbA1C: No results for input(s): "HGBA1C" in the last 72 hours. CBG: No results for input(s): "GLUCAP" in the last 168 hours. Lipid Profile: No results for input(s): "CHOL", "HDL", "LDLCALC", "TRIG", "CHOLHDL", "LDLDIRECT" in the last 72 hours. Thyroid Function Tests: No results for input(s): "TSH", "T4TOTAL", "FREET4", "T3FREE", "THYROIDAB" in the last 72 hours. Anemia Panel: No results for input(s): "VITAMINB12", "FOLATE", "FERRITIN", "TIBC", "IRON", "RETICCTPCT" in the last 72 hours. Sepsis Labs: No results for input(s): "PROCALCITON", "LATICACIDVEN" in the last 168 hours.  No results found for this or any previous visit (from the past 240 hours).       Radiology Studies: CT Head Wo Contrast Result Date: 10/13/2023 CLINICAL DATA:  83 year old female status post fall from bed. Altered mental status. EXAM: CT HEAD WITHOUT CONTRAST TECHNIQUE: Contiguous axial images were obtained from the base of the skull through the vertex without intravenous contrast. RADIATION DOSE REDUCTION: This exam was performed according to the departmental dose-optimization program which includes automated exposure control, adjustment of the mA and/or kV according to patient size and/or use of iterative reconstruction technique. COMPARISON:  Brain MRI  05/12/2021.  Head CT 03/05/2023. FINDINGS: Brain: Stable cerebral volume since last year. No midline shift, ventriculomegaly, mass effect, evidence of mass lesion, intracranial hemorrhage or evidence of cortically based acute infarction. Stable gray-white differentiation, patchy moderate for age white matter hypodensity. Vascular: No suspicious intracranial vascular hyperdensity. Calcified atherosclerosis at the skull base. Skull: Appears stable and intact. Sinuses/Orbits: Widespread paranasal sinus opacification and low-density fluid levels appear to be inflammatory, new since last year. Tympanic cavities and mastoids remain clear. Other: No discrete orbit or scalp soft tissue injury identified. IMPRESSION: 1. No acute intracranial abnormality or acute traumatic injury identified. Chronic cerebral white matter disease. 2. Widespread paranasal sinus inflammation new since last year, consider acute sinusitis. Electronically Signed   By: Marlise Simpers M.D.   On: 10/13/2023 13:55        Scheduled Meds:  apixaban   5 mg Oral BID   divalproex   500 mg Oral QHS   metoprolol  succinate  75 mg Oral Daily   OXcarbazepine   150 mg Oral BID   pantoprazole   40 mg Oral Daily   potassium chloride   40 mEq Oral  BID   simvastatin   40 mg Oral QHS   Continuous Infusions:  cefTRIAXone  (ROCEPHIN )  IV     magnesium  sulfate bolus IVPB       LOS: 1 day    Time spent: 55 minutes    Klint Lezcano Loran Rock, DO [facility]  If 7PM-7AM, please contact night-coverage [email or web address] 10/14/2023, 7:30 AM

## 2023-10-14 NOTE — Plan of Care (Signed)

## 2023-10-14 NOTE — Plan of Care (Signed)
  Problem: Acute Rehab OT Goals (only OT should resolve) Goal: Pt. Will Perform Grooming Flowsheets (Taken 10/14/2023 1304) Pt Will Perform Grooming: with modified independence Goal: Pt. Will Perform Upper Body Dressing Flowsheets (Taken 10/14/2023 1304) Pt Will Perform Upper Body Dressing:  with set-up  sitting Goal: Pt. Will Perform Lower Body Dressing Flowsheets (Taken 10/14/2023 1304) Pt Will Perform Lower Body Dressing:  with mod assist  sitting/lateral leans Goal: Pt. Will Transfer To Toilet Flowsheets (Taken 10/14/2023 1304) Pt Will Transfer to Toilet:  with min assist  with mod assist  stand pivot transfer Goal: Pt. Will Perform Toileting-Clothing Manipulation Flowsheets (Taken 10/14/2023 1304) Pt Will Perform Toileting - Clothing Manipulation and hygiene:  with mod assist  sitting/lateral leans Goal: Pt/Caregiver Will Perform Home Exercise Program Flowsheets (Taken 10/14/2023 1304) Pt/caregiver will Perform Home Exercise Program:  Increased strength  Increased ROM  Both right and left upper extremity  Independently  Jaeshawn Silvio OT, MOT

## 2023-10-14 NOTE — Care Management Important Message (Signed)
 Important Message  Patient Details  Name: Donna Barron MRN: [id number] Date of Birth: [birth year]   Important Message Given:  Yes - Medicare IM     Deyon Chizek L Mackinzee Roszak 10/14/2023, 2:00 PM

## 2023-10-15 DIAGNOSIS — G9341 Metabolic encephalopathy: Secondary | ICD-10-CM | POA: Diagnosis not present

## 2023-10-15 LAB — CBC
HCT: 48.3 % — ABNORMAL HIGH (ref 36.0–46.0)
Hemoglobin: 15.1 g/dL — ABNORMAL HIGH (ref 12.0–15.0)
MCH: 29.5 pg (ref 26.0–34.0)
MCHC: 31.3 g/dL (ref 30.0–36.0)
MCV: 94.3 fL (ref 80.0–100.0)
Platelets: 165 10*3/uL (ref 150–400)
RBC: 5.12 MIL/uL — ABNORMAL HIGH (ref 3.87–5.11)
RDW: 15.4 % (ref 11.5–15.5)
WBC: 7.9 10*3/uL (ref 4.0–10.5)
nRBC: 0 % (ref 0.0–0.2)

## 2023-10-15 LAB — BASIC METABOLIC PANEL WITH GFR
Anion gap: 9 (ref 5–15)
BUN: 8 mg/dL (ref 8–23)
CO2: 27 mmol/L (ref 22–32)
Calcium: 9.1 mg/dL (ref 8.9–10.3)
Chloride: 102 mmol/L (ref 98–111)
Creatinine, Ser: 0.52 mg/dL (ref 0.44–1.00)
GFR, Estimated: >60 mL/min (ref >60)
Glucose, Bld: 85 mg/dL (ref 70–99)
Potassium: 4.1 mmol/L (ref 3.5–5.1)
Sodium: 138 mmol/L (ref 135–145)

## 2023-10-15 LAB — MAGNESIUM: Magnesium: 2 mg/dL (ref 1.7–2.4)

## 2023-10-15 MED ORDER — FLUCONAZOLE 100 MG PO TABS
200.0000 mg | ORAL_TABLET | Freq: Once | ORAL | Status: AC
  Administered 2023-10-15: 200 mg via ORAL
  Filled 2023-10-15: qty 2

## 2023-10-15 MED ORDER — SODIUM CHLORIDE 0.9 % IV SOLN
2.0000 g | INTRAVENOUS | Status: AC
  Administered 2023-10-15: 2 g via INTRAVENOUS
  Filled 2023-10-15: qty 20

## 2023-10-15 MED ORDER — NYSTATIN 100000 UNIT/GM EX POWD
Freq: Two times a day (BID) | CUTANEOUS | Status: DC
Start: 2023-10-15 — End: 2023-10-17
  Filled 2023-10-15: qty 15

## 2023-10-15 NOTE — Plan of Care (Signed)
  Problem: Activity: Goal: Risk for activity intolerance will decrease Outcome: Progressing   Problem: Nutrition: Goal: Adequate nutrition will be maintained Outcome: Progressing   Problem: Coping: Goal: Level of anxiety will decrease Outcome: Progressing   Problem: Pain Managment: Goal: General experience of comfort will improve and/or be controlled Outcome: Progressing   Problem: Safety: Goal: Ability to remain free from injury will improve Outcome: Progressing

## 2023-10-15 NOTE — Progress Notes (Signed)
 SLP Cancellation Note  Patient Details Name: Donna Barron MRN: [id number] DOB: [birth year]   Cancelled treatment:       Reason Eval/Treat Not Completed: Other (comment) Pt with baseline cognitive impairment; pt currently with metabolic encephalopathy secondary to UTI and CT reveals no acute intracranial abnormality. Communicated with MD and in agreement SLE not indicated at this time. Please re-order if acute cognitive change indicates SLE. Our service will sign off, thank you,  Aiden Helzer H. Vergil Glasser, CCC-SLP Speech Language Pathologist    Florina Husbands 10/15/2023, 11:58 AM

## 2023-10-15 NOTE — Progress Notes (Addendum)
 PROGRESS NOTE    Donna Barron  ZOX:[id number] DOB: Jul 24, 1940 DOA: 10/13/2023 PCP: Sallye Crease, NP   Brief Narrative:    Donna Barron is a 83 y.o. female with medical history significant for dementia, seizures, hypertension, dyslipidemia, GERD, and PAF on Eliquis  who presented to the ED at the urging of her neurologist for altered mental status.  She has apparently had a decrease in functional and mental status over the last 2-3 weeks.  She was admitted with acute metabolic encephalopathy secondary to UTI versus subclinical seizures; her mentation appears to be improving.  She will have completed her E. coli UTI treatment on 5/3, now awaiting SNF placement.  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 coli UTI - Avoid neurologic agents aside from seizure medications at this time and continue to monitor closely with seizure precautions - Complete treatment with Rocephin  5/3 with culture showing E. coli sensitive to Rocephin  - Resume home Lexapro and Ativan  -PT/OT evaluation recommending SNF and family agreeable for placement   Hypertension - Continue metoprolol    Dyslipidemia - Continue statin   PAF on Eliquis  - Continue anticoagulation with Eliquis -CT head negative -Rate control with metoprolol  - Continue telemetry monitoring   History of dementia - Monitor for any signs of agitation/behavioral disturbances   GERD - PPI  Yeast infection under breasts and vaginal region -Administer 1 dose of Diflucan  5/3 -Nystatin powder ordered as well   Obesity, class I - BMI 34.75   DVT prophylaxis:apixaban  Code Status: Full Family Communication: Daughter on phone  5/3 Disposition Plan:  Status is: Inpatient Remains inpatient appropriate because: Need for IV medications.   Consultants:  None  Procedures:  None  Antimicrobials:  Anti-infectives (From admission, onward)    Start     Dose/Rate Route Frequency Ordered Stop   10/15/23 1045  fluconazole  (DIFLUCAN ) tablet 200 mg        200 mg Oral  Once 10/15/23 0957     10/14/23 1000  cefTRIAXone  (ROCEPHIN ) 2 g in sodium chloride  0.9 % 100 mL IVPB        2 g 200 mL/hr over 30 Minutes Intravenous Every 24 hours 10/13/23 1512     10/13/23 1345  cefTRIAXone  (ROCEPHIN ) 2 g in sodium chloride  0.9 % 100 mL IVPB        2 g 200 mL/hr over 30 Minutes Intravenous  Once 10/13/23 1332 10/13/23 1425       Subjective: Patient seen and evaluated today with no new acute complaints or concerns. No acute concerns or events noted overnight.  She appears to be more alert and awake and responsive to questioning this a.m.  Objective: Vitals:   10/14/23 1421 10/14/23 1424 10/14/23 2006 10/15/23 0521  BP: (!) 137/59  129/67 (!) 143/66  Pulse: 78  76 73  Resp: (!) 21 20 19 20   Temp: 99.1 F (37.3 C)  98.2 F (36.8 C) (!) 97.5 F (36.4 C)  TempSrc: Oral  Oral Oral  SpO2: 93%  94% 94%  Weight:      Height:        Intake/Output Summary (Last 24 hours) at 10/15/2023 0957 Last data filed at 10/15/2023 8119 Gross per 24 hour  Intake 340 ml  Output  750 ml  Net -410 ml   Filed Weights   10/13/23 1302 10/13/23 1711  Weight: 86.2 kg 84.1 kg    Examination:  General exam: Appears calm and comfortable  Respiratory system: Clear to auscultation. Respiratory effort normal. Cardiovascular system: S1 & S2 heard, RRR.  Gastrointestinal system: Abdomen is soft Central nervous system: Alert and awake Extremities: No edema Skin: No significant lesions noted Psychiatry: Flat affect.    Data Reviewed: I have personally reviewed following labs and imaging studies  CBC: Recent Labs  Lab 10/13/23 1340  10/14/23 0512 10/15/23 0447  WBC 11.1* 9.7 7.9  NEUTROABS 8.7*  --   --   HGB 17.1* 14.7 15.1*  HCT 52.5* 46.6* 48.3*  MCV 92.3 92.8 94.3  PLT 292 242 165   Basic Metabolic Panel: Recent Labs  Lab 10/13/23 1340 10/14/23 0512 10/15/23 0447  NA 136 137 138  K 3.6 3.4* 4.1  CL 97* 100 102  CO2 25 26 27   GLUCOSE 123* 105* 85  BUN 13 13 8   CREATININE 0.77 0.61 0.52  CALCIUM 9.9 8.9 9.1  MG  --  1.4* 2.0   GFR: Estimated Creatinine Clearance: 54.5 mL/min (by C-G formula based on SCr of 0.52 mg/dL). Liver Function Tests: Recent Labs  Lab 10/13/23 1340  AST 35  ALT 13  ALKPHOS 69  BILITOT 0.9  PROT 6.8  ALBUMIN 3.3*   No results for input(s): "LIPASE", "AMYLASE" in the last 168 hours. No results for input(s): "AMMONIA" in the last 168 hours. Coagulation Profile: No results for input(s): "INR", "PROTIME" in the last 168 hours. Cardiac Enzymes: No results for input(s): "CKTOTAL", "CKMB", "CKMBINDEX", "TROPONINI" in the last 168 hours. BNP (last 3 results) No results for input(s): "PROBNP" in the last 8760 hours. HbA1C: No results for input(s): "HGBA1C" in the last 72 hours. CBG: No results for input(s): "GLUCAP" in the last 168 hours. Lipid Profile: No results for input(s): "CHOL", "HDL", "LDLCALC", "TRIG", "CHOLHDL", "LDLDIRECT" in the last 72 hours. Thyroid Function Tests: No results for input(s): "TSH", "T4TOTAL", "FREET4", "T3FREE", "THYROIDAB" in the last 72 hours. Anemia Panel: No results for input(s): "VITAMINB12", "FOLATE", "FERRITIN", "TIBC", "IRON", "RETICCTPCT" in the last 72 hours. Sepsis Labs: No results for input(s): "PROCALCITON", "LATICACIDVEN" in the last 168 hours.  Recent Results (from the past 240 hours)  Urine Culture     Status: Abnormal (Preliminary result)   Collection Time: 10/13/23  1:14 PM   Specimen: Urine, Catheterized  Result Value Ref Range Status   Specimen Description   Final    URINE, CATHETERIZED Performed at [facility], [address], [place], [place] [address]    Special Requests   Final    NONE Performed at [facility], [address], [place], [place] [address]    Culture (A)  Final    >=100,000 COLONIES/mL ESCHERICHIA COLI CULTURE REINCUBATED FOR BETTER GROWTH Performed at [facility], [address] [place], [place] [address]    Report Status PENDING  Incomplete   Organism ID, Bacteria ESCHERICHIA COLI (A)  Final      Susceptibility   Escherichia coli - MIC*    AMPICILLIN >=32 RESISTANT Resistant     CEFAZOLIN  >=64 RESISTANT Resistant     CEFEPIME <=0.12 SENSITIVE Sensitive     CEFTRIAXONE  <=0.25 SENSITIVE Sensitive     CIPROFLOXACIN >=4 RESISTANT Resistant     GENTAMICIN <=1 SENSITIVE Sensitive     IMIPENEM <=0.25 SENSITIVE Sensitive     NITROFURANTOIN <=16 SENSITIVE Sensitive  TRIMETH/SULFA <=20 SENSITIVE Sensitive     AMPICILLIN/SULBACTAM >=32 RESISTANT Resistant     PIP/TAZO >=128 RESISTANT Resistant ug/mL    * >=100,000 COLONIES/mL ESCHERICHIA COLI         Radiology Studies: CT Head Wo Contrast Result Date: 10/13/2023 CLINICAL DATA:  83 year old female status post fall from bed. Altered mental status. EXAM: CT HEAD WITHOUT CONTRAST TECHNIQUE: Contiguous axial images were obtained from the base of the skull through the vertex without intravenous contrast. RADIATION DOSE REDUCTION: This exam was performed according to the departmental dose-optimization program which includes automated exposure control, adjustment of the mA and/or kV according to patient size and/or use of iterative reconstruction technique. COMPARISON:  Brain MRI 05/12/2021.  Head CT 03/05/2023. FINDINGS: Brain: Stable cerebral volume since last year. No midline shift, ventriculomegaly, mass effect, evidence of mass lesion, intracranial hemorrhage or evidence of cortically based acute infarction. Stable gray-white differentiation, patchy moderate for age white matter hypodensity. Vascular: No suspicious  intracranial vascular hyperdensity. Calcified atherosclerosis at the skull base. Skull: Appears stable and intact. Sinuses/Orbits: Widespread paranasal sinus opacification and low-density fluid levels appear to be inflammatory, new since last year. Tympanic cavities and mastoids remain clear. Other: No discrete orbit or scalp soft tissue injury identified. IMPRESSION: 1. No acute intracranial abnormality or acute traumatic injury identified. Chronic cerebral white matter disease. 2. Widespread paranasal sinus inflammation new since last year, consider acute sinusitis. Electronically Signed   By: Marlise Simpers M.D.   On: 10/13/2023 13:55        Scheduled Meds:  apixaban   5 mg Oral BID   divalproex   500 mg Oral QHS   fluconazole   200 mg Oral Once   metoprolol  succinate  75 mg Oral Daily   nystatin   Topical BID   OXcarbazepine   150 mg Oral BID   pantoprazole   40 mg Oral Daily   simvastatin   40 mg Oral QHS   Continuous Infusions:  cefTRIAXone  (ROCEPHIN )  IV Stopped (10/14/23 1149)     LOS: 2 days    Time spent: 55 minutes    Jahliyah Trice D Mason Sole, DO [facility]  If 7PM-7AM, please contact night-coverage [email or web address] 10/15/2023, 9:57 AM

## 2023-10-16 DIAGNOSIS — E782 Mixed hyperlipidemia: Secondary | ICD-10-CM | POA: Diagnosis not present

## 2023-10-16 DIAGNOSIS — G9341 Metabolic encephalopathy: Secondary | ICD-10-CM | POA: Diagnosis not present

## 2023-10-16 DIAGNOSIS — F0390 Unspecified dementia without behavioral disturbance: Secondary | ICD-10-CM

## 2023-10-16 DIAGNOSIS — N390 Urinary tract infection, site not specified: Principal | ICD-10-CM

## 2023-10-16 DIAGNOSIS — I10 Essential (primary) hypertension: Secondary | ICD-10-CM

## 2023-10-16 DIAGNOSIS — E66811 Obesity, class 1: Secondary | ICD-10-CM

## 2023-10-16 DIAGNOSIS — E6609 Other obesity due to excess calories: Secondary | ICD-10-CM

## 2023-10-16 DIAGNOSIS — Z6834 Body mass index (BMI) 34.0-34.9, adult: Secondary | ICD-10-CM

## 2023-10-16 LAB — BASIC METABOLIC PANEL WITH GFR
Anion gap: 9 (ref 5–15)
BUN: 7 mg/dL — ABNORMAL LOW (ref 8–23)
CO2: 29 mmol/L (ref 22–32)
Calcium: 8.8 mg/dL — ABNORMAL LOW (ref 8.9–10.3)
Chloride: 99 mmol/L (ref 98–111)
Creatinine, Ser: 0.55 mg/dL (ref 0.44–1.00)
GFR, Estimated: >60 mL/min (ref >60)
Glucose, Bld: 89 mg/dL (ref 70–99)
Potassium: 4.1 mmol/L (ref 3.5–5.1)
Sodium: 137 mmol/L (ref 135–145)

## 2023-10-16 LAB — CBC
HCT: 46.2 % — ABNORMAL HIGH (ref 36.0–46.0)
Hemoglobin: 15.1 g/dL — ABNORMAL HIGH (ref 12.0–15.0)
MCH: 30.4 pg (ref 26.0–34.0)
MCHC: 32.7 g/dL (ref 30.0–36.0)
MCV: 93 fL (ref 80.0–100.0)
Platelets: 251 10*3/uL (ref 150–400)
RBC: 4.97 MIL/uL (ref 3.87–5.11)
RDW: 15.7 % — ABNORMAL HIGH (ref 11.5–15.5)
WBC: 7.5 10*3/uL (ref 4.0–10.5)
nRBC: 0 % (ref 0.0–0.2)

## 2023-10-16 LAB — MAGNESIUM: Magnesium: 1.9 mg/dL (ref 1.7–2.4)

## 2023-10-16 NOTE — Progress Notes (Signed)
 PROGRESS NOTE    Donna Barron  WUJ:[id number] DOB: 27-Oct-1940 DOA: 10/13/2023 PCP: Sallye Crease, NP   Brief Narrative:    Donna Barron is a 83 y.o. female with medical history significant for dementia, seizures, hypertension, dyslipidemia, GERD, and PAF on Eliquis  who presented to the ED at the urging of her neurologist for altered mental status.  She has apparently had a decrease in functional and mental status over the last 2-3 weeks.  She was admitted with acute metabolic encephalopathy secondary to UTI versus subclinical seizures; her mentation appears to be improving.  She will have completed her E. coli UTI treatment on 5/3, now awaiting SNF placement.  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 coli UTI - Continue to avoid neurologic agents aside from seizure medications at this time and continue to monitor closely with seizure precautions. - Patient has completed treatment with Rocephin  5/3 with culture showing E. coli sensitive to Rocephin  - Continue home Lexapro and Ativan . -PT/OT evaluation recommending SNF and family agreeable for placement. - TOC aware and helping with discharge plans.   Hypertension - Continue metoprolol  -Heart healthy diet discussed with patient - Follow vital signs status.   Dyslipidemia - Continue statin -Heart healthy diet discussed with patient.   PAF on Eliquis  - Continue anticoagulation with Eliquis  -CT head negative - Continue metoprolol  for rate control - Continue telemetry monitoring.   History of dementia - No behavioral disturbances appreciated - Per family reports mentation back to baseline - Continue constant  reorientation and supportive care.   GERD - Continue PPI  Yeast infection under breasts and vaginal region - Start response 1 dose of Diflucan ; continue nystatin powder - Keep area clean and dry.   Obesity, class I - Low-calorie diet, portion control and increase physical activity discussed with patient -BMI 34.75  History of seizure disorder - No seizure activity currently appreciated - Continue current antiepileptic drugs.   DVT prophylaxis:apixaban  Code Status: Full Family Communication: Husband and son updated at bedside. Disposition Plan:  Status is: Inpatient Remains inpatient appropriate because: Need for IV medications.   Consultants:  None  Procedures:  None  Antimicrobials:  Anti-infectives (From admission, onward)    Start     Dose/Rate Route Frequency Ordered Stop   10/15/23 1045  fluconazole  (DIFLUCAN ) tablet 200 mg        200 mg Oral  Once 10/15/23 0957 10/15/23 1105   10/15/23 1000  cefTRIAXone  (ROCEPHIN ) 2 g in sodium chloride  0.9 % 100 mL IVPB        2 g 200 mL/hr over 30 Minutes Intravenous Every 24 hours 10/15/23 0959 10/15/23 1159   10/14/23 1000  cefTRIAXone  (ROCEPHIN ) 2 g in sodium chloride  0.9 % 100 mL IVPB  Status:  Discontinued        2 g 200 mL/hr over 30 Minutes Intravenous Every 24 hours 10/13/23 1512 10/15/23 0959   10/13/23 1345  cefTRIAXone  (ROCEPHIN ) 2 g in sodium chloride  0.9 % 100 mL IVPB        2 g 200 mL/hr over 30 Minutes Intravenous  Once 10/13/23 1332 10/13/23 1425       Subjective: Mentation back to baseline; no chest pain, no nausea, no vomiting.  Feeling weak and  tired.  No overnight events.  Objective: Vitals:   10/15/23 2134 10/16/23 0534 10/16/23 1011 10/16/23 1255  BP: (!) 121/51 138/67 (!) 143/53 (!) 114/52  Pulse: 75 76 75 76  Resp: 20 20 19    Temp: (!) 97.4 F (36.3 C) 97.7 F (36.5 C)  97.8 F (36.6 C)  TempSrc: Oral Oral    SpO2: 94% 94%  94%  Weight:      Height:        Intake/Output Summary (Last  24 hours) at 10/16/2023 1626 Last data filed at 10/16/2023 1300 Gross per 24 hour  Intake 240 ml  Output 800 ml  Net -560 ml   Filed Weights   10/13/23 1302 10/13/23 1711  Weight: 86.2 kg 84.1 kg    Examination: General exam: Alert, awake, oriented x 3; in no acute distress; weak and deconditioned.  Following commands appropriately.  No fever Respiratory system: Good saturation on room air; no using accessory muscle.  Good effort bilaterally. Cardiovascular system:RRR.  No rubs, no gallops, no JVD. Gastrointestinal system: Abdomen is obese, nondistended, soft and nontender. No organomegaly or masses felt. Normal bowel sounds heard. Central nervous system: Moving 4 limbs spontaneously.  No focal neurological deficits.  Generally weak Extremities: No cyanosis or clubbing. Skin: No petechiae; just candidiasis appreciated on the right breast. Psychiatry: Judgement and insight appear normal.  Flat affect appreciated on exam.   Data Reviewed: I have personally reviewed following labs and imaging studies  CBC: Recent Labs  Lab 10/13/23 1340 10/14/23 0512 10/15/23 0447 10/16/23 0551  WBC 11.1* 9.7 7.9 7.5  NEUTROABS 8.7*  --   --   --   HGB 17.1* 14.7 15.1* 15.1*  HCT 52.5* 46.6* 48.3* 46.2*  MCV 92.3 92.8 94.3 93.0  PLT 292 242 165 251   Basic Metabolic Panel: Recent Labs  Lab 10/13/23 1340 10/14/23 0512 10/15/23 0447 10/16/23 0551  NA 136 137 138 137  K 3.6 3.4* 4.1 4.1  CL 97* 100 102 99  CO2 25 26 27 29   GLUCOSE 123* 105* 85 89  BUN 13 13 8  7*  CREATININE 0.77 0.61 0.52 0.55  CALCIUM 9.9 8.9 9.1 8.8*  MG  --  1.4* 2.0 1.9   GFR: Estimated Creatinine Clearance: 54.5 mL/min (by C-G formula based on SCr of 0.55 mg/dL).  Liver Function Tests: Recent Labs  Lab 10/13/23 1340  AST 35  ALT 13  ALKPHOS 69  BILITOT 0.9  PROT 6.8  ALBUMIN 3.3*    Recent Results (from the past 240 hours)  Urine Culture     Status: Abnormal (Preliminary result)   Collection Time:  10/13/23  1:14 PM   Specimen: Urine, Catheterized  Result Value Ref Range Status   Specimen Description   Final    URINE, CATHETERIZED Performed at [facility], [address], [place], [place] [address]    Special Requests   Final    NONE Performed at [facility], [address], [place], [place] [address]    Culture (A)  Final    >=100,000 COLONIES/mL ESCHERICHIA COLI >=100,000 COLONIES/mL GRAM POSITIVE COCCI IDENTIFICATION TO FOLLOW Performed at [facility], [address], [place], [place] [address]    Report Status PENDING  Incomplete   Organism ID, Bacteria ESCHERICHIA COLI (A)  Final      Susceptibility   Escherichia coli - MIC*    AMPICILLIN >=32 RESISTANT Resistant     CEFAZOLIN  >=64 RESISTANT Resistant     CEFEPIME <=0.12 SENSITIVE Sensitive  CEFTRIAXONE  <=0.25 SENSITIVE Sensitive     CIPROFLOXACIN >=4 RESISTANT Resistant     GENTAMICIN <=1 SENSITIVE Sensitive     IMIPENEM <=0.25 SENSITIVE Sensitive     NITROFURANTOIN <=16 SENSITIVE Sensitive     TRIMETH/SULFA <=20 SENSITIVE Sensitive     AMPICILLIN/SULBACTAM >=32 RESISTANT Resistant     PIP/TAZO >=128 RESISTANT Resistant ug/mL    * >=100,000 COLONIES/mL ESCHERICHIA COLI   Radiology Studies: No results found.  Scheduled Meds:  apixaban   5 mg Oral BID   divalproex   500 mg Oral QHS   metoprolol  succinate  75 mg Oral Daily   nystatin   Topical BID   OXcarbazepine   150 mg Oral BID   pantoprazole   40 mg Oral Daily   simvastatin   40 mg Oral QHS   Continuous Infusions:     LOS: 3 days    Time spent: 50 minutes    Donna Oman, MD [facility]  If 7PM-7AM, please contact night-coverage [email or web address] 10/16/2023, 4:26 PM

## 2023-10-17 DIAGNOSIS — G9341 Metabolic encephalopathy: Secondary | ICD-10-CM | POA: Diagnosis not present

## 2023-10-17 LAB — OXCARBAZEPINE (TRILEPTAL), SERUM: Oxcarbazepine Metabolite: 8 ug/mL — ABNORMAL LOW (ref 10–35)

## 2023-10-17 LAB — URINE CULTURE: Culture: >=100000 — AB

## 2023-10-17 MED ORDER — DULCOLAX 5 MG PO TBEC: 5.0000 mg | DELAYED_RELEASE_TABLET | Freq: Every day | ORAL | 1 refills | Status: AC | PRN

## 2023-10-17 MED ORDER — BISACODYL 5 MG PO TBEC
10.0000 mg | DELAYED_RELEASE_TABLET | Freq: Once | ORAL | Status: AC
  Administered 2023-10-17: 10 mg via ORAL
  Filled 2023-10-17: qty 2

## 2023-10-17 NOTE — Discharge Summary (Addendum)
 Physician Discharge Summary  KABRINA RASMUSSON ZOX:[id number] DOB: 03-05-1941 DOA: 10/13/2023  PCP: Sallye Crease, NP  Admit date: 10/13/2023  Discharge date: 10/17/2023  Admitted From:Home  Disposition:  SNF  Recommendations for Outpatient Follow-up:  Follow up with PCP in 1-2 weeks Remain on medications as prior Follow-up with neurology as scheduled in 2 months Completed course of treatment for UTI  Home Health: None  Equipment/Devices: None  Discharge Condition:Stable  CODE STATUS: Full  Diet recommendation: Heart Healthy  Brief/Interim Summary: RUKAYA BOOZ is a 83 y.o. female with medical history significant for dementia, seizures, hypertension, dyslipidemia, GERD, and PAF on Eliquis  who presented to the ED at the urging of her neurologist for altered mental status.  She has apparently had a decrease in functional and mental status over the last 2-3 weeks.  She was admitted with acute metabolic encephalopathy secondary to UTI versus subclinical seizures; her mentation appears to be improving.  She will have completed her E. coli UTI treatment on 5/3, now awaiting SNF placement.  She now has SNF bed available and may discharge.  No other acute events or concerns noted throughout the course of this admission.  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 coli UTI.  Discharge Instructions  Discharge Instructions     Diet - low sodium heart healthy   Complete by: As directed    Increase activity slowly   Complete by: As directed    No wound care   Complete by: As directed       Allergies as of 10/17/2023       Reactions   Codeine  Swelling   Swelling of lips    Hydrocodone  Swelling   Lip swelling   Avelox [moxifloxacin Hcl In Nacl] Other (See Comments)   Insomnia    Oxybutynin Palpitations, Other (See Comments)   Tongue felt coated         Medication List     TAKE these medications    apixaban  5 MG Tabs tablet Commonly known as: Eliquis  Take 1 tablet (5 mg total) by mouth 2 (two) times daily.   divalproex  250 MG 24 hr tablet Commonly known as: Depakote  ER Take 2 tablets every night What changed:  how much to take how to take this when to take this   Dulcolax 5 MG EC tablet Generic drug: bisacodyl Take 1 tablet (5 mg total) by mouth daily as needed for moderate constipation.   escitalopram 20 MG tablet Commonly known as: LEXAPRO Take 20 mg by mouth at bedtime.   melatonin 5 MG Tabs Take 5 mg by mouth at bedtime.   metoprolol  succinate 50 MG 24 hr tablet Commonly known as: TOPROL -XL Take 1.5 tablets (75 mg total) by mouth daily.   nystatin cream Commonly known as: MYCOSTATIN Apply 1 Application topically 2 (two) times daily as needed (yeast).   OXcarbazepine  150 MG tablet Commonly known as: TRILEPTAL  Take 1 tablet (150 mg total) by mouth 2 (two) times daily.   Ozempic (0.25 or 0.5 MG/DOSE) 2 MG/3ML Sopn Generic drug: Semaglutide(0.25 or 0.5MG /DOS) Inject 0.25 mg into the skin once a week.   pantoprazole  40 MG tablet Commonly known as: PROTONIX  Take 40 mg by mouth daily.   simvastatin  40 MG tablet Commonly known as:  ZOCOR  Take 1 tablet (40 mg total) by mouth at bedtime.   Vitamin D (Ergocalciferol) 1.25 MG (50000 UNIT) Caps capsule Commonly known as: DRISDOL Take 50,000 Units by mouth once a week.        Contact information for follow-up providers     Sallye Crease, NP. Schedule an appointment as soon as possible for a visit in 1 week(s).   Specialty: Family Medicine Contact information: [address] [place] [address] [phone]         Jhonny Moss, MD. Go in 2 month(s).    Specialty: Neurology Contact information: [address] [place] [address] [phone]              Contact information for after-discharge care     Destination     [facility] Preferred SNF .   Service: Skilled Nursing Contact information: [address] [place]  [address] [phone]                    Allergies  Allergen Reactions   Codeine  Swelling    Swelling of lips   Hydrocodone  Swelling    Lip swelling   Avelox [Moxifloxacin Hcl In Nacl] Other (See Comments)    Insomnia    Oxybutynin Palpitations and Other (See Comments)    Tongue felt coated     Consultations: None   Procedures/Studies: CT Head Wo Contrast Result Date: 10/13/2023 CLINICAL DATA:  83 year old female status post fall from bed. Altered mental status. EXAM: CT HEAD WITHOUT CONTRAST TECHNIQUE: Contiguous axial images were obtained from the base of the skull through the vertex without intravenous contrast. RADIATION DOSE REDUCTION: This exam was performed according to the departmental dose-optimization program which includes automated exposure control, adjustment of the mA and/or kV according to patient size and/or use of iterative reconstruction technique. COMPARISON:  Brain MRI 05/12/2021.  Head CT 03/05/2023. FINDINGS: Brain: Stable cerebral volume since last year. No midline shift, ventriculomegaly, mass effect, evidence of mass lesion, intracranial hemorrhage or evidence of cortically based acute infarction. Stable gray-white differentiation, patchy moderate for age white matter hypodensity. Vascular: No suspicious intracranial vascular hyperdensity. Calcified atherosclerosis at the skull base. Skull: Appears stable and intact. Sinuses/Orbits: Widespread paranasal sinus opacification and low-density fluid levels appear to be inflammatory, new since last year. Tympanic cavities and mastoids remain clear. Other: No discrete orbit or scalp soft  tissue injury identified. IMPRESSION: 1. No acute intracranial abnormality or acute traumatic injury identified. Chronic cerebral white matter disease. 2. Widespread paranasal sinus inflammation new since last year, consider acute sinusitis. Electronically Signed   By: Marlise Simpers M.D.   On: 10/13/2023 13:55   ECHOCARDIOGRAM COMPLETE Result Date: 09/30/2023    ECHOCARDIOGRAM REPORT   Patient Name:   NALIA STEFKO Date of Exam: 09/29/2023 Medical Rec #:  [id number]      Height:       61.0 in Accession #:    [id number]     Weight:       199.0 lb Date of Birth:  1941-02-21      BSA:          1.885 m Patient Age:    82 years       BP:           110/68 mmHg Patient Gender: F              HR:  72 bpm. Exam Location:  [facility] Procedure: 2D Echo, Cardiac Doppler and Color Doppler (Both Spectral and Color            Flow Doppler were utilized during procedure). Indications:    R60.0 Lower extremity edema  History:        Patient has no prior history of Echocardiogram examinations and                 Patient has prior history of Echocardiogram examinations, most                 recent 07/23/2021. Arrythmias:Atrial Fibrillation; Risk                 Factors:Morbid obesity, Hypertension, Dyslipidemia and                 Non-Smoker.  Sonographer:    Reed Canes RCS, RVS Referring Phys: [id number] [facility]  Sonographer Comments: Technically difficult study due to poor echo windows and patient is obese. Limited patient movement. IMPRESSIONS  1. There is chordal SAM with midlly turbulent LVOT flow but no significant gradient. . Left ventricular ejection fraction, by estimation, is 70 to 75%. The left ventricle has hyperdynamic function. The left ventricle has no regional wall motion abnormalities. There is moderate left ventricular hypertrophy. Left ventricular diastolic parameters are consistent with Grade I diastolic dysfunction (impaired relaxation).  2. Right ventricular systolic function is normal. The right ventricular  size is normal. Tricuspid regurgitation signal is inadequate for assessing PA pressure.  3. The mitral valve is normal in structure. No evidence of mitral valve regurgitation. No evidence of mitral stenosis.  4. The tricuspid valve is abnormal.  5. The aortic valve was not well visualized. Aortic valve regurgitation is not visualized. No aortic stenosis is present.  6. The inferior vena cava is normal in size with greater than 50% respiratory variability, suggesting right atrial pressure of 3 mmHg. Comparison(s): A prior study was performed on 07/23/2021. EF 65-70%. Severe LVH. Moderate MAC. FINDINGS  Left Ventricle: There is chordal SAM with midlly turbulent LVOT flow but no significant gradient. Left ventricular ejection fraction, by estimation, is 70 to 75%. The left ventricle has hyperdynamic function. The left ventricle has no regional wall motion abnormalities. The left ventricular internal cavity size was normal in size. There is moderate left ventricular hypertrophy. Left ventricular diastolic parameters are consistent with Grade I diastolic dysfunction (impaired relaxation). Right Ventricle: The right ventricular size is normal. Right vetricular wall thickness was not well visualized. Right ventricular systolic function is normal. Tricuspid regurgitation signal is inadequate for assessing PA pressure. Left Atrium: Left atrial size was normal in size. Right Atrium: Right atrial size was normal in size. Pericardium: There is no evidence of pericardial effusion. Mitral Valve: The mitral valve is normal in structure. No evidence of mitral valve regurgitation. No evidence of mitral valve stenosis. MV peak gradient, 7.3 mmHg. The mean mitral valve gradient is 2.0 mmHg. Tricuspid Valve: The tricuspid valve is abnormal. Tricuspid valve regurgitation is mild . No evidence of tricuspid stenosis. Aortic Valve: The aortic valve was not well visualized. Aortic valve regurgitation is not visualized. No aortic stenosis is  present. Aortic valve mean gradient measures 5.0 mmHg. Aortic valve peak gradient measures 9.5 mmHg. Aortic valve area, by VTI measures 2.30 cm. Pulmonic Valve: The pulmonic valve was not well visualized. Pulmonic valve regurgitation is not visualized. No evidence of pulmonic stenosis. Aorta: The aortic root is normal in size and structure. Venous: The  inferior vena cava is normal in size with greater than 50% respiratory variability, suggesting right atrial pressure of 3 mmHg. IAS/Shunts: No atrial level shunt detected by color flow Doppler.  LEFT VENTRICLE PLAX 2D LVIDd:         3.90 cm LVIDs:         2.90 cm LV PW:         1.40 cm LV IVS:        1.40 cm LVOT diam:     2.00 cm LV SV:         68 LV SV Index:   36 LVOT Area:     3.14 cm  LV Volumes (MOD) LV vol d, MOD A2C: 26.1 ml LV vol d, MOD A4C: 24.1 ml LV vol s, MOD A2C: 7.2 ml LV vol s, MOD A4C: 7.3 ml LV SV MOD A2C:     19.0 ml LV SV MOD A4C:     24.1 ml LV SV MOD BP:      18.1 ml RIGHT VENTRICLE RV Basal diam:  2.70 cm RV Mid diam:    3.20 cm RV S prime:     12.00 cm/s TAPSE (M-mode): 2.2 cm LEFT ATRIUM             Index        RIGHT ATRIUM           Index LA diam:        4.00 cm 2.12 cm/m   RA Area:     12.10 cm LA Vol (A2C):   76.7 ml 40.70 ml/m  RA Volume:   28.50 ml  15.12 ml/m LA Vol (A4C):   40.6 ml 21.54 ml/m LA Biplane Vol: 60.0 ml 31.83 ml/m  AORTIC VALVE                     PULMONIC VALVE AV Area (Vmax):    1.96 cm      PV Vmax:       0.77 m/s AV Area (Vmean):   2.04 cm      PV Peak grad:  2.3 mmHg AV Area (VTI):     2.30 cm AV Vmax:           154.00 cm/s AV Vmean:          106.000 cm/s AV VTI:            0.294 m AV Peak Grad:      9.5 mmHg AV Mean Grad:      5.0 mmHg LVOT Vmax:         96.10 cm/s LVOT Vmean:        68.800 cm/s LVOT VTI:          0.215 m LVOT/AV VTI ratio: 0.73  AORTA Ao Root diam: 2.90 cm MITRAL VALVE MV Area (PHT): 2.46 cm     SHUNTS MV Area VTI:   2.20 cm     Systemic VTI:  0.22 m MV Peak grad:  7.3 mmHg      Systemic Diam: 2.00 cm MV Mean grad:  2.0 mmHg MV Vmax:       1.35 m/s MV Vmean:      67.6 cm/s MV Decel Time: 308 msec MV E velocity: 74.10 cm/s MV A velocity: 113.00 cm/s MV E/A ratio:  0.66 Armida Lander MD Electronically signed by Armida Lander MD Signature Date/Time: 09/30/2023/4:34:46 PM    Final      Discharge Exam: Vitals:   10/17/23 4098 10/17/23  1420  BP: 131/61 138/63  Pulse: 75 84  Resp: 16 19  Temp: 98.4 F (36.9 C) 98.2 F (36.8 C)  SpO2: 94% 93%   Vitals:   10/16/23 1255 10/16/23 1941 10/17/23 0417 10/17/23 1420  BP: (!) 114/52 (!) 120/54 131/61 138/63  Pulse: 76 76 75 84  Resp:   16 19  Temp: 97.8 F (36.6 C) 98.7 F (37.1 C) 98.4 F (36.9 C) 98.2 F (36.8 C)  TempSrc:  Axillary Oral   SpO2: 94% 91% 94% 93%  Weight:      Height:        General: Pt is alert, awake, not in acute distress Cardiovascular: RRR, S1/S2 +, no rubs, no gallops Respiratory: CTA bilaterally, no wheezing, no rhonchi Abdominal: Soft, NT, ND, bowel sounds + Extremities: no edema, no cyanosis    The results of significant diagnostics from this hospitalization (including imaging, microbiology, ancillary and laboratory) are listed below for reference.     Microbiology: Recent Results (from the past 240 hours)  Urine Culture     Status: Abnormal   Collection Time: 10/13/23  1:14 PM   Specimen: Urine, Catheterized  Result Value Ref Range Status   Specimen Description   Final    URINE, CATHETERIZED Performed at [facility], [address], [place], [place] [address]    Special Requests   Final    NONE Performed at [facility], [address], [place], [place] [address]    Culture (A)  Final    >=100,000 COLONIES/mL ESCHERICHIA COLI >=100,000 COLONIES/mL ROTHIA SPECIES ROTHIA KRISTINAE Standardized susceptibility testing for this organism is not available. Performed at [facility], [address] [place], [place] [address]    Report Status 10/17/2023 FINAL  Final    Organism ID, Bacteria ESCHERICHIA COLI (A)  Final      Susceptibility   Escherichia coli - MIC*    AMPICILLIN >=32 RESISTANT Resistant     CEFAZOLIN  >=64 RESISTANT Resistant     CEFEPIME <=0.12 SENSITIVE Sensitive     CEFTRIAXONE  <=0.25 SENSITIVE Sensitive     CIPROFLOXACIN >=4 RESISTANT Resistant     GENTAMICIN <=1 SENSITIVE Sensitive     IMIPENEM <=0.25 SENSITIVE Sensitive     NITROFURANTOIN <=16 SENSITIVE Sensitive     TRIMETH/SULFA <=20 SENSITIVE Sensitive     AMPICILLIN/SULBACTAM >=32 RESISTANT Resistant     PIP/TAZO >=128 RESISTANT Resistant ug/mL    * >=100,000 COLONIES/mL ESCHERICHIA COLI     Labs: BNP (last 3 results) No results for input(s): "BNP" in the last 8760 hours. Basic Metabolic Panel: Recent Labs  Lab 10/13/23 1340 10/14/23 0512 10/15/23 0447 10/16/23 0551  NA 136 137 138 137  K 3.6 3.4* 4.1 4.1  CL 97* 100 102 99  CO2 25 26 27 29   GLUCOSE 123* 105* 85 89  BUN 13 13 8  7*  CREATININE 0.77 0.61 0.52 0.55  CALCIUM 9.9 8.9 9.1 8.8*  MG  --  1.4* 2.0 1.9   Liver Function Tests: Recent Labs  Lab 10/13/23 1340  AST 35  ALT 13  ALKPHOS 69  BILITOT 0.9  PROT 6.8  ALBUMIN 3.3*   No results for input(s): "LIPASE", "AMYLASE" in the last 168 hours. No results for input(s): "AMMONIA" in the last 168 hours. CBC: Recent Labs  Lab 10/13/23 1340 10/14/23 0512 10/15/23 0447 10/16/23 0551  WBC 11.1* 9.7 7.9 7.5  NEUTROABS 8.7*  --   --   --   HGB 17.1* 14.7 15.1* 15.1*  HCT  52.5* 46.6* 48.3* 46.2*  MCV 92.3 92.8 94.3 93.0  PLT 292 242 165 251   Cardiac Enzymes: No results for input(s): "CKTOTAL", "CKMB", "CKMBINDEX", "TROPONINI" in the last 168 hours. BNP: Invalid input(s): "POCBNP" CBG: No results for input(s): "GLUCAP" in the last 168 hours. D-Dimer No results for input(s): "DDIMER" in the last 72 hours. Hgb A1c No results for input(s): "HGBA1C" in the last 72 hours. Lipid Profile No results for input(s): "CHOL", "HDL", "LDLCALC", "TRIG",  "CHOLHDL", "LDLDIRECT" in the last 72 hours. Thyroid function studies No results for input(s): "TSH", "T4TOTAL", "T3FREE", "THYROIDAB" in the last 72 hours.  Invalid input(s): "FREET3" Anemia work up No results for input(s): "VITAMINB12", "FOLATE", "FERRITIN", "TIBC", "IRON", "RETICCTPCT" in the last 72 hours. Urinalysis    Component Value Date/Time   COLORURINE AMBER (A) 10/13/2023 1512   APPEARANCEUR CLOUDY (A) 10/13/2023 1512   LABSPEC 1.027 10/13/2023 1512   PHURINE 5.0 10/13/2023 1512   GLUCOSEU NEGATIVE 10/13/2023 1512   HGBUR MODERATE (A) 10/13/2023 1512   BILIRUBINUR NEGATIVE 10/13/2023 1512   KETONESUR 5 (A) 10/13/2023 1512   PROTEINUR 100 (A) 10/13/2023 1512   NITRITE POSITIVE (A) 10/13/2023 1512   LEUKOCYTESUR LARGE (A) 10/13/2023 1512   Sepsis Labs Recent Labs  Lab 10/13/23 1340 10/14/23 0512 10/15/23 0447 10/16/23 0551  WBC 11.1* 9.7 7.9 7.5   Microbiology Recent Results (from the past 240 hours)  Urine Culture     Status: Abnormal   Collection Time: 10/13/23  1:14 PM   Specimen: Urine, Catheterized  Result Value Ref Range Status   Specimen Description   Final    URINE, CATHETERIZED Performed at [facility], [address] [place], [place] [address]    Special Requests   Final    NONE Performed at [facility], [address] [place], [place] [address]    Culture (A)  Final    >=100,000 COLONIES/mL ESCHERICHIA COLI >=100,000 COLONIES/mL ROTHIA SPECIES ROTHIA KRISTINAE Standardized susceptibility testing for this organism is not available. Performed at [facility], [address], [place], [place] [address]    Report Status 10/17/2023 FINAL  Final   Organism ID, Bacteria ESCHERICHIA COLI (A)  Final      Susceptibility   Escherichia coli - MIC*    AMPICILLIN >=32 RESISTANT Resistant     CEFAZOLIN  >=64 RESISTANT Resistant     CEFEPIME <=0.12 SENSITIVE Sensitive     CEFTRIAXONE  <=0.25 SENSITIVE Sensitive     CIPROFLOXACIN >=4 RESISTANT  Resistant     GENTAMICIN <=1 SENSITIVE Sensitive     IMIPENEM <=0.25 SENSITIVE Sensitive     NITROFURANTOIN <=16 SENSITIVE Sensitive     TRIMETH/SULFA <=20 SENSITIVE Sensitive     AMPICILLIN/SULBACTAM >=32 RESISTANT Resistant     PIP/TAZO >=128 RESISTANT Resistant ug/mL    * >=100,000 COLONIES/mL ESCHERICHIA COLI     Time coordinating discharge: 35 minutes  SIGNED:   Cornelius Dill, DO [facility] 10/17/2023, 2:30 PM  If 7PM-7AM, please contact night-coverage [email or web address]

## 2023-10-17 NOTE — TOC Progression Note (Addendum)
 Transition of Care [facility]) - Progression Note    Patient Details  Name: Donna Barron MRN: [id number] Date of Birth: Apr 12, 1941  [facility]) CM/SW Contact  Orelia Binet, RN Phone Number: 10/17/2023, 10:10 AM  Clinical Narrative:   Per MD patient is medically ready for discharge. CM spoke with daughter, [name] to see if she had further discuss SNF with anyone. Our last conversation they were discussing home with home health with Family. She did talk to [facility] over the weekend and requested [facility] for rehab. FL2 completed today and sent to [facility] and CM contacted Maudine Sos, Patient is medically ready.    Maudine Sos has a bed at [facility], Daughter is accepted, MD completed DC summary, CM sent DC Summary to [facility], Kerri provided room number, RN calling report.     Barriers to Discharge: Continued Medical Work up  Expected Discharge Plan and Services      Living arrangements for the past 2 months: Single Family Home Expected Discharge Date: 10/17/23                   Social Determinants of Health (SDOH) Interventions SDOH Screenings   Food Insecurity: No Food Insecurity (10/13/2023)  Housing: Low Risk  (10/13/2023)  Transportation Needs: No Transportation Needs (10/13/2023)  Utilities: Not At Risk (10/13/2023)  Financial Resource Strain: Low Risk  (08/11/2023)   Received from [facility]  Physical Activity: Unknown (01/13/2023)   Received from [facility]  Social Connections: Patient Unable To Answer (10/13/2023)  Stress: No Stress Concern Present (01/13/2023)   Received from [facility]  Tobacco Use: Low Risk  (10/13/2023)    Readmission Risk Interventions    10/13/2023    9:11 PM  Readmission Risk Prevention Plan  Post Dischage Appt Complete  Medication Screening Complete  Transportation Screening Complete

## 2023-10-17 NOTE — Care Management Important Message (Signed)
 Important Message  Patient Details  Name: Donna Barron MRN: [id number] Date of Birth: July 24, 1940   Important Message Given:  Yes - Medicare IM     Hawk Mones L Lynette Noah 10/17/2023, 10:32 AM

## 2023-10-17 NOTE — [place] FL2 (Signed)
   MEDICAID [id number] LEVEL OF CARE FORM     IDENTIFICATION  Patient Name: Donna Barron Birthdate: [birth year] Sex: female Admission Date (Current Location): 10/13/2023  [facility] and [place] Number:  [employer] and Address:  [facility],  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, without behavioral disturbance (HCC) 09/24/2021   Status post Nissen fundoplication Oct 2021 03/24/2020   Status post laparoscopic Nissen fundoplication 03/24/2020   Overactive bladder 12/04/2019   Dysphagia 09/11/2019   S/P left rotator cuff repair 06/15/18 06/15/2018   Nontraumatic complete tear of left rotator cuff    Class 2 severe obesity with serious comorbidity and body mass index (BMI) of 35.0 to 35.9 in adult [facility]) 05/23/2018   S/P carpal tunnel and DeQuervains release right 07/14/17 07/20/2017   Carpal tunnel syndrome of right wrist    De Quervain's disease (tenosynovitis)    Essential hypertension 11/15/2013   Palpitations 06/28/2013   Mixed hyperlipidemia 06/28/2013   Obesity, unspecified 06/28/2013    Orientation RESPIRATION BLADDER Height & Weight     Self  Normal External catheter Weight: 84.1 kg Height:  5\' 2"  (157.5 cm)  BEHAVIORAL SYMPTOMS/MOOD NEUROLOGICAL BOWEL NUTRITION STATUS      Continent Diet (See DC Summary)  AMBULATORY STATUS COMMUNICATION OF NEEDS Skin   Extensive Assist Verbally  Bruising                       Personal Care Assistance Level of Assistance  Dressing, Bathing, Feeding Bathing Assistance: Maximum assistance Feeding assistance: Limited assistance Dressing Assistance: Maximum assistance     Functional Limitations Info  Sight, Hearing, Speech Sight Info: Adequate Hearing Info: Adequate Speech Info: Adequate    SPECIAL CARE FACTORS FREQUENCY  PT (By licensed PT), OT (By licensed OT)     PT Frequency: 5 Times a week OT Frequency: 3 Time a week            Contractures Contractures Info: Not present    Additional Factors Info  Code Status, Allergies Code Status Info: Full Allergies Info: Codeine , Hydrocodone , Abelox, Oxybutynin           Current Medications (10/17/2023):  This is the current hospital active medication list Current Facility-Administered Medications  Medication Dose Route Frequency Provider Last Rate Last Admin   acetaminophen  (TYLENOL ) tablet 650 mg  650 mg Oral Q6H PRN Mason Sole, Pratik D, DO       Or   acetaminophen  (TYLENOL ) suppository 650 mg  650 mg Rectal Q6H PRN Mason Sole, Pratik D, DO       apixaban  (ELIQUIS ) tablet 5 mg  5 mg Oral BID Mason Sole, Pratik D, DO   5 mg at 10/16/23 2156   divalproex  (DEPAKOTE  ER) 24 hr tablet 500 mg  500 mg Oral QHS Shah, Pratik D, DO   500 mg at 10/16/23 2156   LORazepam  (ATIVAN ) injection 2 mg  2 mg Intravenous Q4H PRN Mason Sole,  Pratik D, DO       metoprolol  succinate (TOPROL -XL) 24 hr tablet 75 mg  75 mg Oral Daily Mason Sole, Pratik D, DO   75 mg at 10/16/23 1013   nystatin (MYCOSTATIN/NYSTOP) topical powder   Topical BID Shah, Pratik D, DO   Given at 10/16/23 2156   ondansetron  (ZOFRAN ) tablet 4 mg  4 mg Oral Q6H PRN Mason Sole, Pratik D, DO       Or   ondansetron  (ZOFRAN ) injection 4 mg  4 mg Intravenous Q6H PRN Mason Sole, Pratik D, DO       OXcarbazepine  (TRILEPTAL ) tablet 150 mg  150 mg Oral BID Shah, Pratik D, DO   150 mg at 10/16/23 2157   pantoprazole  (PROTONIX ) EC tablet 40 mg  40 mg Oral Daily Mason Sole,  Pratik D, DO   40 mg at 10/16/23 1000   simvastatin  (ZOCOR ) tablet 40 mg  40 mg Oral QHS Shah, Pratik D, DO   40 mg at 10/16/23 2156   Facility-Administered Medications Ordered in Other Encounters  Medication Dose Route Frequency Provider Last Rate Last Admin   fentaNYL  (SUBLIMAZE ) injection 25-50 mcg  25-50 mcg Intravenous Q5 min PRN Tura Gaines, MD         Discharge Medications: Please see discharge summary for a list of discharge medications.  Relevant Imaging Results:  Relevant Lab Results:   Additional Information SS# [id number]  Orelia Binet, RN

## 2023-10-18 ENCOUNTER — Encounter: Payer: Self-pay | Admitting: Internal Medicine

## 2023-10-18 ENCOUNTER — Non-Acute Institutional Stay (SKILLED_NURSING_FACILITY): Payer: Self-pay | Admitting: Internal Medicine

## 2023-10-18 DIAGNOSIS — E66812 Obesity, class 2: Secondary | ICD-10-CM | POA: Diagnosis not present

## 2023-10-18 DIAGNOSIS — I10 Essential (primary) hypertension: Secondary | ICD-10-CM

## 2023-10-18 DIAGNOSIS — Z6835 Body mass index (BMI) 35.0-35.9, adult: Secondary | ICD-10-CM

## 2023-10-18 DIAGNOSIS — R002 Palpitations: Secondary | ICD-10-CM

## 2023-10-18 DIAGNOSIS — F0390 Unspecified dementia without behavioral disturbance: Secondary | ICD-10-CM

## 2023-10-18 DIAGNOSIS — G9341 Metabolic encephalopathy: Secondary | ICD-10-CM

## 2023-10-18 NOTE — Assessment & Plan Note (Signed)
 On exam slight tachycardia is present without irregularity.  TSH will be checked.

## 2023-10-18 NOTE — Assessment & Plan Note (Signed)
 Patient can provide no history and is almost nonverbal.  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   For completeness sake VDRL, B12, and TSH will be completed.

## 2023-10-18 NOTE — Assessment & Plan Note (Signed)
 Apparently Ozempic was ordered at discharge from the hospital 10/17/2023.  Here at the SNF she is not eating well and needs encouragement.  She has been placed in restorative feeding program.  Ozempic would be essentially contraindicated in the setting.  It will not be initiated and she will be monitored as to intake.

## 2023-10-18 NOTE — Assessment & Plan Note (Signed)
 CNS imaging has documented small vessel ischemic disease and prior right cerebellar infarct.  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  According to her daughter behavioral issues increased when placed on Aricept. BCAT MMSE will be performed here at the SNF.

## 2023-10-18 NOTE — Progress Notes (Unsigned)
 NURSING HOME LOCATION:  [facility] ROOM NUMBER:  [id number]  CODE STATUS: Full Code   PCP:  Aris Kuster NP  This is a comprehensive admission note to this SNFperformed on this date less than 30 days from date of admission. Included are preadmission medical/surgical history; reconciled medication list; family history; social history and comprehensive review of systems.  Corrections and additions to the records were documented. Comprehensive physical exam was also performed. Additionally a clinical summary was entered for each active diagnosis pertinent to this admission in the Problem List to enhance continuity of care.  HPI: She was hospitalized 5/1-10/17/2023 presenting to the ED with urging of her urologist for altered mental status.  Apparently there has been a decrease in functional capacity and mental status over the previous 2-3 weeks PTA.  She was admitted with acute metabolic encephalopathy secondary to UTI versus subclinical seizures.  Culture revealed E. coli f and Rothia  Kristiniae for which antibiotic course completed. Initial CBC suggested hemoconcentration with H/H of 17.1/53.5; but BUN was normal at 13.  Final H/H was 15.1/46.2.  Albumin was low at 3.3 but total protein was normal at 6.8.  Valproic acid  level was subtherapeutic at 37.  No TSH or B12 studies were performed. SNF placement for rehab was recommended.  Past medical and surgical history: Includes history of seizures, colon polyps, anxiety/ depression, history of cholelithiasis, dementia, PAF, GERD, essential hypertension, dementia with behavioral issues,and dyslipidemia. Surgeries and procedures include carpal tunnel release, cholecystectomy, hiatal hernia repair, and hysteroscopic D&C.  Family history: reviewed, non contributory due to advanced age.  Interestingly there is strong family history of colon polyps as well as colon cancer in her brother.  Social history: Nondrinker; non-smoker.   Review  of systems: Clinical neurocognitive deficits made validity of responses questionable , compromising ROS completion.  The patient had no comprehension of hospital events.  She stated that she was "fine." Other than some tingling in the right upper extremity; all answers were monosyllabic "no."  Thankfully her daughter was in attendance and was able to supplement the history.  The daughter states that they have had difficulty getting her to take her Depakote  because of anxiety.  They have been splitting the pill in the past but they were trying to obtain a not extended release for 1 to allow this. The daughter states that she received Diflucan  for yeast at the panty line and under her breasts. The daughter states that the patient's last seizure was 3/16 when she did not take her Depakote  and had not been sleeping. Prior to this admission he had taken 1 person to help her set up and ambulate but she has been essentially nonambulatory for 3 weeks PTA.   She has been placed in the restorative eating program as she eats poorly, requiring encouragement for meal consumption.She also requires one person assist. Constitutional: No fever, significant weight change, fatigue  Eyes: No redness, discharge, pain, vision change ENT/mouth: No nasal congestion, purulent discharge, earache, change in hearing, sore throat  Cardiovascular: No chest pain, palpitations, paroxysmal nocturnal dyspnea, claudication, edema  Respiratory: No cough, sputum production, hemoptysis, DOE, significant snoring, apnea Gastrointestinal: No heartburn, dysphagia, abdominal pain, nausea /vomiting, rectal bleeding, melena, change in bowels Genitourinary: No dysuria, hematuria, pyuria, incontinence, nocturia Musculoskeletal: No joint stiffness, joint swelling, weakness, pain Dermatologic: No rash, pruritus, change in appearance of skin Neurologic: No dizziness, headache, syncope, seizures, numbness, tingling Psychiatric: No significant  anxiety, depression, insomnia, anorexia Endocrine: No change in hair/skin/nails, excessive  thirst, excessive hunger, excessive urination  Hematologic/lymphatic: No significant bruising, lymphadenopathy, abnormal bleeding Allergy/immunology: No itchy/watery eyes, significant sneezing, urticaria, angioedema  Physical exam:  Pertinent or positive findings: She has a suspicious countenance.  She is almost nonverbal.  She has bilateral ptosis.  She is wearing upper plate but not wearing her lower partial.  Slight tachycardia was noted.  Abdomen is protuberant.  Pedal pulses are decreased.  She has 1+ edema at the sock line.  There is dryness and slight keratotic change to the skin of the extremities.  Strength opposition is essentially equal in all extremities. General appearance: Adequately nourished; no acute distress, increased work of breathing is present.   Lymphatic: No lymphadenopathy about the head, neck, axilla. Eyes: No conjunctival inflammation or lid edema is present. There is no scleral icterus. Ears:  External ear exam shows no significant lesions or deformities.   Nose:  External nasal examination shows no deformity or inflammation. Nasal mucosa are pink and moist without lesions, exudates Oral exam: Lips and gums are healthy appearing.There is no oropharyngeal erythema or exudate. Neck:  No thyromegaly, masses, tenderness noted.    Heart:  Normal rate and regular rhythm. S1 and S2 normal without gallop, murmur, click, rub.  Lungs: Chest clear to auscultation without wheezes, rhonchi, rales, rubs. Abdomen: Bowel sounds are normal.  Abdomen is soft and nontender with no organomegaly, hernias, masses. GU: Deferred  Extremities:  No cyanosis, clubbing, edema. Neurologic exam:  Strength equal  in upper & lower extremities. Balance, Rhomberg, finger to nose testing could not be completed due to clinical state Deep tendon reflexes are equal Skin: Warm & dry w/o tenting. No significant  lesions or rash.  See clinical summary under each active problem in the Problem List with associated updated therapeutic plan

## 2023-10-18 NOTE — Patient Instructions (Signed)
 See assessment and plan under each diagnosis in the problem list and acutely for this visit

## 2023-10-19 ENCOUNTER — Other Ambulatory Visit (HOSPITAL_COMMUNITY)
Admission: RE | Admit: 2023-10-19 | Discharge: 2023-10-19 | Disposition: A | Discharge status: Home / Self Care | Source: Skilled Nursing Facility | Attending: Adult Health | Admitting: Adult Health

## 2023-10-19 DIAGNOSIS — G9341 Metabolic encephalopathy: Secondary | ICD-10-CM | POA: Insufficient documentation

## 2023-10-19 LAB — VITAMIN B12: Vitamin B-12: 236 pg/mL (ref 180–914)

## 2023-10-19 LAB — TSH: TSH: 3.926 u[IU]/mL (ref 0.350–4.500)

## 2023-10-19 LAB — RPR: RPR Ser Ql: NONREACTIVE

## 2023-10-20 NOTE — Assessment & Plan Note (Signed)
 BP is actually soft. Average will be verified & beta blocker dosage decreased if soft BP persistent finding.

## 2023-11-02 ENCOUNTER — Encounter: Payer: Self-pay | Admitting: Adult Health

## 2023-11-02 DIAGNOSIS — I480 Paroxysmal atrial fibrillation: Secondary | ICD-10-CM | POA: Insufficient documentation

## 2023-11-02 NOTE — Progress Notes (Signed)
 Location:  [facility] Room Number: 135 Place of Service:  SNF (31)   CODE STATUS: full code   Allergies  Allergen Reactions   Codeine  Swelling    Swelling of lips   Hydrocodone  Swelling    Lip swelling   Avelox [Moxifloxacin Hcl In Nacl] Other (See Comments)    Insomnia    Oxybutynin Palpitations and Other (See Comments)    Tongue felt coated     Chief Complaint  Patient presents with   Acute Visit    Care plan meeting     HPI:  We have come together for her care plan meeting. Family present. BCAT 13/50. BIMS 6/15 mood 0/30 . She uses wheelchair with one fall with skin tear. She requires moderate to dependent assist with adl care. She is incontinent of bladder and frequently incontinent of bowel . Dietary D3 thin liquids supervision for meals; appetite 0-75% mostly <50%; weight is 188 pounds . Therapy: ambulates 35 feet with walker and mod assist; mod assist upper body; mod assist lower body; min assist for transfers; brp: max assist; needs care transfer. She will continue to be followed for her chronic illnesses including: Essential hypertension Paroxysmal atrial fibrillation Mild major neurocognitive disorder due to probable alzheimer's disease without behavioral disturbance.   Past Medical History:  Diagnosis Date   Anxiety    Cataract    Colon polyps    Dementia (HCC)    Depression    Dysrhythmia    heart palipations   Gallstones    GERD (gastroesophageal reflux disease)    HTN (hypertension)    Hypercholesteremia    PONV (postoperative nausea and vomiting)     Past Surgical History:  Procedure Laterality Date   APPENDECTOMY     ARTHROSCOPIC REPAIR ACL  05/2008   Left knee   CARPAL TUNNEL RELEASE Right 07/14/2017   Procedure: CARPAL TUNNEL RELEASE;  Surgeon: Darrin Emerald, MD;  Location: [facility];  Service: Orthopedics;  Laterality: Right;   CATARACT EXTRACTION, BILATERAL  2020   CHOLECYSTECTOMY     DILATION AND CURETTAGE OF UTERUS      DORSAL COMPARTMENT RELEASE Right 07/14/2017   Procedure: RELEASE DORSAL COMPARTMENT (DEQUERVAIN);  Surgeon: Darrin Emerald, MD;  Location: [facility];  Service: Orthopedics;  Laterality: Right;   HIATAL HERNIA REPAIR N/A 03/24/2020   Procedure: LAPAROSCOPIC REPAIR OF HIATAL HERNIA, nissen fundoplication;  Surgeon: Jacolyn Matar, MD;  Location: [facility];  Service: General;  Laterality: N/A;   HYSTEROSCOPY WITH D & C N/A 12/25/2013   Procedure: DILATATION AND CURETTAGE /HYSTEROSCOPY;  Surgeon: Deann Exon, MD;  Location: [facility];  Service: Gynecology;  Laterality: N/A;   SHOULDER OPEN ROTATOR CUFF REPAIR Left 06/15/2018   Procedure: ROTATOR CUFF REPAIR SHOULDER OPEN WITH ACROMIOPLASTY;  Surgeon: Darrin Emerald, MD;  Location: [facility];  Service: Orthopedics;  Laterality: Left;   TUBAL LIGATION      Social History   Socioeconomic History   Marital status: Widowed    Spouse name: Not on file   Number of children: 4   Years of education: Not on file   Highest education level: Not on file  Occupational History   Occupation: retired    Associate Professor: OTHER  Tobacco Use   Smoking status: Never   Smokeless tobacco: Never  Vaping Use   Vaping status: Never Used  Substance and Sexual Activity   Alcohol use: Not Currently   Drug use: No   Sexual activity: Not Currently  Other Topics  Concern   Not on file  Social History Narrative   Are you right handed or left handed? Right    Are you currently employed ? no   What is your current occupation?   Do you live at home alone? No    Who lives with you? daughter   What type of home do you live in: 1 story or 2 story? 1 story        Social Drivers of Corporate investment banker Strain: Low Risk  (08/11/2023)   Received from [facility]   Overall Financial Resource Strain (CARDIA)    Difficulty of Paying Living Expenses: Not very hard  Food Insecurity: No Food Insecurity (10/13/2023)   Hunger Vital Sign    Worried About Running Out of Food  in the Last Year: Never true    Ran Out of Food in the Last Year: Never true  Transportation Needs: No Transportation Needs (10/13/2023)   PRAPARE - Administrator, Civil Service (Medical): No    Lack of Transportation (Non-Medical): No  Physical Activity: Unknown (01/13/2023)   [facility] from [facility]   Exercise Vital Sign    Days of Exercise per Week: 0 days    Minutes of Exercise per Session: Not on file  Stress: No Stress Concern Present (01/13/2023)   Received from [facility] - Occupational Stress Questionnaire    Feeling of Stress : Only a little  Social Connections: Patient Unable To Answer (10/13/2023)   Social Connection and Isolation Panel [NHANES]    Frequency of Communication with Friends and Family: Patient unable to answer    Frequency of Social Gatherings with Friends and Family: Patient unable to answer    Attends Religious Services: Patient unable to answer    Active Member of Clubs or Organizations: Patient unable to answer    Attends Banker Meetings: Patient unable to answer    Marital Status: Patient unable to answer  Intimate Partner Violence: Patient Unable To Answer (10/13/2023)   Humiliation, Afraid, Rape, and Kick questionnaire    Fear of Current or Ex-Partner: Patient unable to answer    Emotionally Abused: Patient unable to answer    Physically Abused: Patient unable to answer    Sexually Abused: Patient unable to answer   Family History  Problem Relation Age of Onset   Heart failure Mother 74   Lung cancer Father 24   Breast cancer Sister    Diabetes Sister    Colon cancer Brother    Colon polyps Son    Colon polyps Daughter       VITAL SIGNS BP 136/75   Pulse 78   Temp (!) 96.8 F (36 C)   Resp 20   Ht 5\' 2"  (1.575 m)   Wt 188 lb (85.3 kg)   SpO2 98%   BMI 34.39 kg/m   Outpatient Encounter Medications as of 11/03/2023  Medication Sig   omeprazole (PRILOSEC) 40 MG  capsule Take 40 mg by mouth daily.   apixaban  (ELIQUIS ) 5 MG TABS tablet Take 1 tablet (5 mg total) by mouth 2 (two) times daily.   bisacodyl  (DULCOLAX) 5 MG EC tablet Take 1 tablet (5 mg total) by mouth daily as needed for moderate constipation.   divalproex  (DEPAKOTE  ER) 250 MG 24 hr tablet Take 2 tablets every night (Patient taking differently: Take 500 mg by mouth at bedtime. Take 2 tablets every night)   escitalopram (LEXAPRO) 20  MG tablet Take 20 mg by mouth at bedtime.    melatonin 5 MG TABS Take 5 mg by mouth at bedtime.   metoprolol  succinate (TOPROL -XL) 50 MG 24 hr tablet Take 1.5 tablets (75 mg total) by mouth daily.   nystatin  cream (MYCOSTATIN ) Apply 1 Application topically 2 (two) times daily as needed (yeast).   OXcarbazepine  (TRILEPTAL ) 150 MG tablet Take 1 tablet (150 mg total) by mouth 2 (two) times daily.   simvastatin  (ZOCOR ) 40 MG tablet Take 1 tablet (40 mg total) by mouth at bedtime.   Vitamin D, Ergocalciferol, (DRISDOL) 1.25 MG (50000 UNIT) CAPS capsule Take 50,000 Units by mouth once a week.   [DISCONTINUED] OZEMPIC, 0.25 OR 0.5 MG/DOSE, 2 MG/3ML SOPN Inject 0.25 mg into the skin once a week.   [DISCONTINUED] pantoprazole  (PROTONIX ) 40 MG tablet Take 40 mg by mouth daily.    Facility-Administered Encounter Medications as of 11/03/2023  Medication   fentaNYL  (SUBLIMAZE ) injection 25-50 mcg     SIGNIFICANT DIAGNOSTIC EXAMS   Review of Systems  Reason unable to perform ROS: unable to fully participate.    Physical Exam Constitutional:      General: She is not in acute distress.    Appearance: She is well-developed. She is not diaphoretic.  Neck:     Thyroid : No thyromegaly.  Cardiovascular:     Rate and Rhythm: Normal rate and regular rhythm.     Heart sounds: Normal heart sounds.  Pulmonary:     Effort: Pulmonary effort is normal. No respiratory distress.     Breath sounds: Normal breath sounds.  Abdominal:     General: Bowel sounds are normal. There is  no distension.     Palpations: Abdomen is soft.     Tenderness: There is no abdominal tenderness.  Musculoskeletal:        General: Normal range of motion.     Cervical back: Neck supple.     Right lower leg: Edema present.     Left lower leg: Edema present.  Lymphadenopathy:     Cervical: No cervical adenopathy.  Skin:    General: Skin is warm and dry.  Neurological:     Mental Status: She is alert. Mental status is at baseline.  Psychiatric:        Mood and Affect: Mood normal.     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   Will continue current medications Will continue therapy as directed Will continue to monitor her status.  Goal of care is: to return home  Time spent with patient: 40 minutes: medications; goals of care; therapy.    Britt Candle NP [facility]   call [phone]

## 2023-11-03 ENCOUNTER — Non-Acute Institutional Stay (SKILLED_NURSING_FACILITY): Payer: Self-pay | Admitting: Adult Health

## 2023-11-03 DIAGNOSIS — I10 Essential (primary) hypertension: Secondary | ICD-10-CM

## 2023-11-03 DIAGNOSIS — F0390 Unspecified dementia without behavioral disturbance: Secondary | ICD-10-CM

## 2023-11-03 DIAGNOSIS — I480 Paroxysmal atrial fibrillation: Secondary | ICD-10-CM

## 2023-11-08 ENCOUNTER — Telehealth: Payer: Self-pay | Admitting: Neurology

## 2023-11-08 DIAGNOSIS — Z0279 Encounter for issue of other medical certificate: Secondary | ICD-10-CM

## 2023-11-08 NOTE — Telephone Encounter (Signed)
 Received [facility] Certification form to be filled out. $25 form fee paid. Form is in Dr. Merita Staples box

## 2023-11-08 NOTE — Telephone Encounter (Signed)
 Done, thanks

## 2023-11-09 NOTE — Telephone Encounter (Signed)
 Paper work has been mailed

## 2023-11-15 ENCOUNTER — Non-Acute Institutional Stay (SKILLED_NURSING_FACILITY): Payer: Self-pay | Admitting: Adult Health

## 2023-11-15 ENCOUNTER — Encounter: Payer: Self-pay | Admitting: Adult Health

## 2023-11-15 DIAGNOSIS — R600 Localized edema: Secondary | ICD-10-CM | POA: Diagnosis not present

## 2023-11-15 NOTE — Progress Notes (Signed)
 Location:  [facility] Room Number: 135 Place of Service:  SNF (31)   CODE STATUS: full   Allergies  Allergen Reactions   Codeine  Swelling    Swelling of lips   Hydrocodone  Swelling    Lip swelling   Avelox [Moxifloxacin Hcl In Nacl] Other (See Comments)    Insomnia    Oxybutynin Palpitations and Other (See Comments)    Tongue felt coated     Chief Complaint  Patient presents with   Acute Visit    Bilateral lower extremity edema     HPI:  Staff report that her lower extremity edema. She denies any chest pain or shortness of breath. She does have bilateral lower extremity pain due to the swelling. There are no indications of inflammation present.   Past Medical History:  Diagnosis Date   Anxiety    Cataract    Colon polyps    Dementia (HCC)    Depression    Dysrhythmia    heart palipations   Gallstones    GERD (gastroesophageal reflux disease)    HTN (hypertension)    Hypercholesteremia    PONV (postoperative nausea and vomiting)     Past Surgical History:  Procedure Laterality Date   APPENDECTOMY     ARTHROSCOPIC REPAIR ACL  05/2008   Left knee   CARPAL TUNNEL RELEASE Right 07/14/2017   Procedure: CARPAL TUNNEL RELEASE;  Surgeon: Darrin Emerald, MD;  Location: [facility];  Service: Orthopedics;  Laterality: Right;   CATARACT EXTRACTION, BILATERAL  2020   CHOLECYSTECTOMY     DILATION AND CURETTAGE OF UTERUS     DORSAL COMPARTMENT RELEASE Right 07/14/2017   Procedure: RELEASE DORSAL COMPARTMENT (DEQUERVAIN);  Surgeon: Darrin Emerald, MD;  Location: [facility];  Service: Orthopedics;  Laterality: Right;   HIATAL HERNIA REPAIR N/A 03/24/2020   Procedure: LAPAROSCOPIC REPAIR OF HIATAL HERNIA, nissen fundoplication;  Surgeon: Jacolyn Matar, MD;  Location: [facility];  Service: General;  Laterality: N/A;   HYSTEROSCOPY WITH D & C N/A 12/25/2013   Procedure: DILATATION AND CURETTAGE /HYSTEROSCOPY;  Surgeon: Deann Exon, MD;  Location: [facility];   Service: Gynecology;  Laterality: N/A;   SHOULDER OPEN ROTATOR CUFF REPAIR Left 06/15/2018   Procedure: ROTATOR CUFF REPAIR SHOULDER OPEN WITH ACROMIOPLASTY;  Surgeon: Darrin Emerald, MD;  Location: [facility];  Service: Orthopedics;  Laterality: Left;   TUBAL LIGATION      Social History   Socioeconomic History   Marital status: Widowed    Spouse name: Not on file   Number of children: 4   Years of education: Not on file   Highest education level: Not on file  Occupational History   Occupation: retired    Associate Professor: OTHER  Tobacco Use   Smoking status: Never   Smokeless tobacco: Never  Vaping Use   Vaping status: Never Used  Substance and Sexual Activity   Alcohol use: Not Currently   Drug use: No   Sexual activity: Not Currently  Other Topics Concern   Not on file  Social History Narrative   Are you right handed or left handed? Right    Are you currently employed ? no   What is your current occupation?   Do you live at home alone? No    Who lives with you? daughter   What type of home do you live in: 1 story or 2 story? 1 story        Social Drivers of Dispensing optician  Resource Strain: Low Risk  (08/11/2023)   Received from [facility]   Overall Financial Resource Strain (CARDIA)    Difficulty of Paying Living Expenses: Not very hard  Food Insecurity: No Food Insecurity (10/13/2023)   Hunger Vital Sign    Worried About Running Out of Food in the Last Year: Never true    Ran Out of Food in the Last Year: Never true  Transportation Needs: No Transportation Needs (10/13/2023)   PRAPARE - Administrator, Civil Service (Medical): No    Lack of Transportation (Non-Medical): No  Physical Activity: Unknown (01/13/2023)   Received from [facility]   Exercise Vital Sign    Days of Exercise per Week: 0 days    Minutes of Exercise per Session: Not on file  Stress: No Stress Concern Present (01/13/2023)   Received from [facility]  Occupational Health - Occupational Stress Questionnaire    Feeling of Stress : Only a little  Social Connections: Patient Unable To Answer (10/13/2023)   Social Connection and Isolation Panel [NHANES]    Frequency of Communication with Friends and Family: Patient unable to answer    Frequency of Social Gatherings with Friends and Family: Patient unable to answer    Attends Religious Services: Patient unable to answer    Active Member of Clubs or Organizations: Patient unable to answer    Attends Banker Meetings: Patient unable to answer    Marital Status: Patient unable to answer  Intimate Partner Violence: Patient Unable To Answer (10/13/2023)   Humiliation, Afraid, Rape, and Kick questionnaire    Fear of Current or Ex-Partner: Patient unable to answer    Emotionally Abused: Patient unable to answer    Physically Abused: Patient unable to answer    Sexually Abused: Patient unable to answer   Family History  Problem Relation Age of Onset   Heart failure Mother 62   Lung cancer Father 63   Breast cancer Sister    Diabetes Sister    Colon cancer Brother    Colon polyps Son    Colon polyps Daughter       VITAL SIGNS BP 129/68   Pulse 80   Temp (!) 97.2 F (36.2 C)   Resp 20   Ht 5\' 2"  (1.575 m)   Wt 191 lb 6.4 oz (86.8 kg)   SpO2 96%   BMI 35.01 kg/m   Outpatient Encounter Medications as of 11/15/2023  Medication Sig   apixaban  (ELIQUIS ) 5 MG TABS tablet Take 1 tablet (5 mg total) by mouth 2 (two) times daily.   bisacodyl  (DULCOLAX) 5 MG EC tablet Take 1 tablet (5 mg total) by mouth daily as needed for moderate constipation.   divalproex  (DEPAKOTE  ER) 250 MG 24 hr tablet Take 2 tablets every night (Patient taking differently: Take 500 mg by mouth at bedtime. Take 2 tablets every night)   escitalopram (LEXAPRO) 20 MG tablet Take 20 mg by mouth at bedtime.    melatonin 5 MG TABS Take 5 mg by mouth at bedtime.   metoprolol  succinate (TOPROL -XL) 50 MG 24 hr tablet  Take 1.5 tablets (75 mg total) by mouth daily.   nystatin  cream (MYCOSTATIN ) Apply 1 Application topically 2 (two) times daily as needed (yeast).   omeprazole (PRILOSEC) 40 MG capsule Take 40 mg by mouth daily.   OXcarbazepine  (TRILEPTAL ) 150 MG tablet Take 1 tablet (150 mg total) by mouth 2 (two) times daily.   simvastatin  (ZOCOR ) 40  MG tablet Take 1 tablet (40 mg total) by mouth at bedtime.   Vitamin D, Ergocalciferol, (DRISDOL) 1.25 MG (50000 UNIT) CAPS capsule Take 50,000 Units by mouth once a week.   Facility-Administered Encounter Medications as of 11/15/2023  Medication   fentaNYL  (SUBLIMAZE ) injection 25-50 mcg     SIGNIFICANT DIAGNOSTIC EXAMS  LABS REVIEWED:   10-16-23: wbc 7.5; hgb 15.1; hct 46.2; mcv 93.0 plt 251; glucose 89; bun 7; creat 0.55; k+ 4.1; na++ 137; ca 8.8; gfr >60; mag 1.9 10-19-23: tsh 3.926 vitamin B 12: 236; RPR: nr  Review of Systems  Constitutional:  Negative for malaise/fatigue.  Respiratory:  Negative for cough and shortness of breath.   Cardiovascular:  Positive for leg swelling. Negative for chest pain and palpitations.  Gastrointestinal:  Negative for abdominal pain, constipation and heartburn.  Musculoskeletal:  Negative for back pain, joint pain and myalgias.  Skin: Negative.   Neurological:  Negative for dizziness.  Psychiatric/Behavioral:  The patient is not nervous/anxious.    Physical Exam Constitutional:      General: She is not in acute distress.    Appearance: She is well-developed. She is not diaphoretic.  Neck:     Thyroid : No thyromegaly.  Cardiovascular:     Rate and Rhythm: Normal rate and regular rhythm.     Pulses: Normal pulses.     Heart sounds: Normal heart sounds.  Pulmonary:     Effort: Pulmonary effort is normal. No respiratory distress.     Breath sounds: Normal breath sounds.  Abdominal:     General: Bowel sounds are normal. There is no distension.     Palpations: Abdomen is soft.     Tenderness: There is no abdominal  tenderness.  Musculoskeletal:        General: Normal range of motion.     Cervical back: Neck supple.     Right lower leg: Edema present.     Left lower leg: Edema present.  Lymphadenopathy:     Cervical: No cervical adenopathy.  Skin:    General: Skin is warm and dry.  Neurological:     Mental Status: She is alert. Mental status is at baseline.  Psychiatric:        Mood and Affect: Mood normal.     ASSESSMENT/ PLAN:  TODAY;   Bilateral lower extremity edema: will give lasix 40 mg now then daily will begin k+ 20 meq daily; and will check BMP on 11-21-23.    Britt Candle NP [facility]  call [phone]

## 2023-11-17 ENCOUNTER — Non-Acute Institutional Stay (SKILLED_NURSING_FACILITY): Payer: Self-pay | Admitting: Adult Health

## 2023-11-17 ENCOUNTER — Other Ambulatory Visit: Payer: Self-pay | Admitting: Adult Health

## 2023-11-17 ENCOUNTER — Encounter: Payer: Self-pay | Admitting: Adult Health

## 2023-11-17 DIAGNOSIS — E66812 Obesity, class 2: Secondary | ICD-10-CM | POA: Diagnosis not present

## 2023-11-17 DIAGNOSIS — F0390 Unspecified dementia without behavioral disturbance: Secondary | ICD-10-CM | POA: Diagnosis not present

## 2023-11-17 DIAGNOSIS — I480 Paroxysmal atrial fibrillation: Secondary | ICD-10-CM | POA: Diagnosis not present

## 2023-11-17 DIAGNOSIS — G40209 Localization-related (focal) (partial) symptomatic epilepsy and epileptic syndromes with complex partial seizures, not intractable, without status epilepticus: Secondary | ICD-10-CM

## 2023-11-17 DIAGNOSIS — Z6835 Body mass index (BMI) 35.0-35.9, adult: Secondary | ICD-10-CM

## 2023-11-17 MED ORDER — SIMVASTATIN 40 MG PO TABS: 40.0000 mg | ORAL_TABLET | Freq: Every day | ORAL | 0 refills | Status: DC

## 2023-11-17 MED ORDER — DIVALPROEX SODIUM ER 250 MG PO TB24: ORAL_TABLET | ORAL | 0 refills | Status: DC

## 2023-11-17 MED ORDER — ESCITALOPRAM OXALATE 20 MG PO TABS: 20.0000 mg | ORAL_TABLET | Freq: Every day | ORAL | 0 refills | Status: DC

## 2023-11-17 MED ORDER — NYSTATIN 100000 UNIT/GM EX CREA: 1.0000 | TOPICAL_CREAM | Freq: Two times a day (BID) | CUTANEOUS | 0 refills | Status: DC | PRN

## 2023-11-17 MED ORDER — POTASSIUM CHLORIDE CRYS ER 20 MEQ PO TBCR: 20.0000 meq | EXTENDED_RELEASE_TABLET | Freq: Every day | ORAL | 0 refills | Status: DC

## 2023-11-17 MED ORDER — OXCARBAZEPINE 150 MG PO TABS: 150.0000 mg | ORAL_TABLET | Freq: Two times a day (BID) | ORAL | 0 refills | Status: DC

## 2023-11-17 MED ORDER — FUROSEMIDE 40 MG PO TABS: 40.0000 mg | ORAL_TABLET | Freq: Every day | ORAL | 0 refills | Status: DC

## 2023-11-17 MED ORDER — VITAMIN D (ERGOCALCIFEROL) 1.25 MG (50000 UNIT) PO CAPS: 50000.0000 [IU] | ORAL_CAPSULE | ORAL | 0 refills | Status: DC

## 2023-11-17 MED ORDER — METOPROLOL SUCCINATE ER 50 MG PO TB24: 75.0000 mg | ORAL_TABLET | Freq: Every day | ORAL | 0 refills | Status: DC

## 2023-11-17 MED ORDER — APIXABAN 5 MG PO TABS: 5.0000 mg | ORAL_TABLET | Freq: Two times a day (BID) | ORAL | 0 refills | Status: DC

## 2023-11-17 NOTE — Progress Notes (Unsigned)
 Location:  [facility] Room Number: 135 Place of Service:  SNF (31)   CODE STATUS: full  Allergies  Allergen Reactions   Codeine  Swelling    Swelling of lips   Hydrocodone  Swelling    Lip swelling   Avelox [Moxifloxacin Hcl In Nacl] Other (See Comments)    Insomnia    Oxybutynin Palpitations and Other (See Comments)    Tongue felt coated     Chief Complaint  Patient presents with   Discharge Note    HPI:  She is being discharged to home with home health for pt/ot/cna. She will need her prescriptions written and will need a transport chair and will need to follow up with her medical provider. She had been hospitalized due to decreased functional status and mental status. She was admitted for acute metabolic encephalopathy and UTI due to e-coli. She completed her antibiotics. She was admitted to this facility for therapy: ambulate 60 feet with rolling walker and contact guard assist. Upper body min assist; lower assist max assist. BRP: max assist.    Past Medical History:  Diagnosis Date   Anxiety    Cataract    Colon polyps    Dementia (HCC)    Depression    Dysrhythmia    heart palipations   Gallstones    GERD (gastroesophageal reflux disease)    HTN (hypertension)    Hypercholesteremia    PONV (postoperative nausea and vomiting)     Past Surgical History:  Procedure Laterality Date   APPENDECTOMY     ARTHROSCOPIC REPAIR ACL  05/2008   Left knee   CARPAL TUNNEL RELEASE Right 07/14/2017   Procedure: CARPAL TUNNEL RELEASE;  Surgeon: Darrin Emerald, MD;  Location: [facility];  Service: Orthopedics;  Laterality: Right;   CATARACT EXTRACTION, BILATERAL  2020   CHOLECYSTECTOMY     DILATION AND CURETTAGE OF UTERUS     DORSAL COMPARTMENT RELEASE Right 07/14/2017   Procedure: RELEASE DORSAL COMPARTMENT (DEQUERVAIN);  Surgeon: Darrin Emerald, MD;  Location: [facility];  Service: Orthopedics;  Laterality: Right;   HIATAL HERNIA REPAIR N/A  03/24/2020   Procedure: LAPAROSCOPIC REPAIR OF HIATAL HERNIA, nissen fundoplication;  Surgeon: Jacolyn Matar, MD;  Location: [facility];  Service: General;  Laterality: N/A;   HYSTEROSCOPY WITH D & C N/A 12/25/2013   Procedure: DILATATION AND CURETTAGE /HYSTEROSCOPY;  Surgeon: Deann Exon, MD;  Location: [facility];  Service: Gynecology;  Laterality: N/A;   SHOULDER OPEN ROTATOR CUFF REPAIR Left 06/15/2018   Procedure: ROTATOR CUFF REPAIR SHOULDER OPEN WITH ACROMIOPLASTY;  Surgeon: Darrin Emerald, MD;  Location: [facility];  Service: Orthopedics;  Laterality: Left;   TUBAL LIGATION      Social History   Socioeconomic History   Marital status: Widowed    Spouse name: Not on file   Number of children: 4   Years of education: Not on file   Highest education level: Not on file  Occupational History   Occupation: retired    Associate Professor: OTHER  Tobacco Use   Smoking status: Never   Smokeless tobacco: Never  Vaping Use   Vaping status: Never Used  Substance and Sexual Activity   Alcohol use: Not Currently   Drug use: No   Sexual activity: Not Currently  Other Topics Concern   Not on file  Social History Narrative   Are you right handed or left handed? Right    Are you currently employed ? no   What is your current occupation?  Do you live at home alone? No    Who lives with you? daughter   What type of home do you live in: 1 story or 2 story? 1 story        Social Drivers of Corporate investment banker Strain: Low Risk  (08/11/2023)   Received from [facility]   Overall Financial Resource Strain (CARDIA)    Difficulty of Paying Living Expenses: Not very hard  Food Insecurity: No Food Insecurity (10/13/2023)   Hunger Vital Sign    Worried About Running Out of Food in the Last Year: Never true    Ran Out of Food in the Last Year: Never true  Transportation Needs: No Transportation Needs (10/13/2023)   PRAPARE - Administrator, Civil Service (Medical): No    Lack of  Transportation (Non-Medical): No  Physical Activity: Unknown (01/13/2023)   Received from [facility]   Exercise Vital Sign    Days of Exercise per Week: 0 days    Minutes of Exercise per Session: Not on file  Stress: No Stress Concern Present (01/13/2023)   Received from [facility] Occupational Health - Occupational Stress Questionnaire    Feeling of Stress : Only a little  Social Connections: Patient Unable To Answer (10/13/2023)   Social Connection and Isolation Panel [NHANES]    Frequency of Communication with Friends and Family: Patient unable to answer    Frequency of Social Gatherings with Friends and Family: Patient unable to answer    Attends Religious Services: Patient unable to answer    Active Member of Clubs or Organizations: Patient unable to answer    Attends Banker Meetings: Patient unable to answer    Marital Status: Patient unable to answer  Intimate Partner Violence: Patient Unable To Answer (10/13/2023)   Humiliation, Afraid, Rape, and Kick questionnaire    Fear of Current or Ex-Partner: Patient unable to answer    Emotionally Abused: Patient unable to answer    Physically Abused: Patient unable to answer    Sexually Abused: Patient unable to answer   Family History  Problem Relation Age of Onset   Heart failure Mother 31   Lung cancer Father 73   Breast cancer Sister    Diabetes Sister    Colon cancer Brother    Colon polyps Son    Colon polyps Daughter       VITAL SIGNS BP 129/68   Pulse 80   Temp (!) 97.2 F (36.2 C)   Resp 20   Ht 5\' 2"  (1.575 m)   Wt 193 lb 3.2 oz (87.6 kg)   SpO2 96%   BMI 35.34 kg/m   Outpatient Encounter Medications as of 11/17/2023  Medication Sig   apixaban  (ELIQUIS ) 5 MG TABS tablet Take 1 tablet (5 mg total) by mouth 2 (two) times daily.   bisacodyl  (DULCOLAX) 5 MG EC tablet Take 1 tablet (5 mg total) by mouth daily as needed for moderate constipation.   divalproex  (DEPAKOTE  ER) 250  MG 24 hr tablet Take 2 tablets every night (Patient taking differently: Take 500 mg by mouth at bedtime. Take 2 tablets every night)   escitalopram (LEXAPRO) 20 MG tablet Take 20 mg by mouth at bedtime.    furosemide (LASIX) 40 MG tablet Take 40 mg by mouth daily.   melatonin 5 MG TABS Take 5 mg by mouth at bedtime.   metoprolol  succinate (TOPROL -XL) 50 MG 24 hr tablet Take 1.5 tablets (  75 mg total) by mouth daily.   nystatin  cream (MYCOSTATIN ) Apply 1 Application topically 2 (two) times daily as needed (yeast).   omeprazole (PRILOSEC) 40 MG capsule Take 40 mg by mouth daily.   OXcarbazepine  (TRILEPTAL ) 150 MG tablet Take 1 tablet (150 mg total) by mouth 2 (two) times daily.   potassium chloride  SA (KLOR-CON  M) 20 MEQ tablet Take 20 mEq by mouth daily.   simvastatin  (ZOCOR ) 40 MG tablet Take 1 tablet (40 mg total) by mouth at bedtime.   Vitamin D, Ergocalciferol, (DRISDOL) 1.25 MG (50000 UNIT) CAPS capsule Take 50,000 Units by mouth once a week.   Facility-Administered Encounter Medications as of 11/17/2023  Medication   fentaNYL  (SUBLIMAZE ) injection 25-50 mcg     SIGNIFICANT DIAGNOSTIC EXAMS  LABS REVIEWED:   10-16-23: wbc 7.5; hgb 15.1; hct 46.2; mcv 93.0 plt 251; glucose 89; bun 7; creat 0.55; k+ 4.1; na++ 137; ca 8.8; gfr >60; mag 1.9 10-19-23: tsh 3.926 vitamin B 12: 236; RPR: nr  Review of Systems  Constitutional:  Negative for malaise/fatigue.  Respiratory:  Negative for cough and shortness of breath.   Cardiovascular:  Negative for chest pain, palpitations and leg swelling.  Gastrointestinal:  Negative for abdominal pain, constipation and heartburn.  Musculoskeletal:  Negative for back pain, joint pain and myalgias.  Skin: Negative.   Neurological:  Negative for dizziness.  Psychiatric/Behavioral:  The patient is not nervous/anxious.    Physical Exam Constitutional:      General: She is not in acute distress.    Appearance: She is well-developed. She is not diaphoretic.   Neck:     Thyroid : No thyromegaly.  Cardiovascular:     Rate and Rhythm: Normal rate and regular rhythm.     Pulses: Normal pulses.     Heart sounds: Normal heart sounds.  Pulmonary:     Effort: Pulmonary effort is normal. No respiratory distress.     Breath sounds: Normal breath sounds.  Abdominal:     General: Bowel sounds are normal. There is no distension.     Palpations: Abdomen is soft.     Tenderness: There is no abdominal tenderness.  Musculoskeletal:        General: Normal range of motion.     Cervical back: Neck supple.     Right lower leg: Edema present.     Left lower leg: Edema present.  Lymphadenopathy:     Cervical: No cervical adenopathy.  Skin:    General: Skin is warm and dry.  Neurological:     Mental Status: She is alert. Mental status is at baseline.  Psychiatric:        Mood and Affect: Mood normal.     ASSESSMENT/ PLAN:   Patient is being discharged with the following home health services:  pt/ot/cna: to evaluate and treat as indicated for gait balance strength adl training; adl care.   Patient is being discharged with the following durable medical equipment:  transfer chair   Patient has been advised to f/u with their PCP in 1-2 weeks to for a transitions of care visit.  Social services at their facility was responsible for arranging this appointment.  Pt was provided with adequate prescriptions of noncontrolled medications to reach the scheduled appointment .  For controlled substances, a limited supply was provided as appropriate for the individual patient.  If the pt normally receives these medications from a pain clinic or has a contract with another physician, these medications should be received from that clinic or  physician only).    A 30 day supply of her prescription medications have been sent to [facility] in [place]  Time spent with patient 40 minutes: medications; home health dme.   Britt Candle NP [facility] Adult [facility]  call  [phone]

## 2023-11-21 ENCOUNTER — Other Ambulatory Visit (HOSPITAL_COMMUNITY)
Admission: RE | Admit: 2023-11-21 | Discharge: 2023-11-21 | Disposition: A | Discharge status: Home / Self Care | Source: Skilled Nursing Facility | Attending: Adult Health | Admitting: Adult Health

## 2023-11-21 DIAGNOSIS — I152 Hypertension secondary to endocrine disorders: Secondary | ICD-10-CM | POA: Insufficient documentation

## 2023-11-21 LAB — BASIC METABOLIC PANEL WITH GFR
Anion gap: 11 (ref 5–15)
BUN: 15 mg/dL (ref 8–23)
CO2: 29 mmol/L (ref 22–32)
Calcium: 9 mg/dL (ref 8.9–10.3)
Chloride: 99 mmol/L (ref 98–111)
Creatinine, Ser: 0.55 mg/dL (ref 0.44–1.00)
GFR, Estimated: >60 mL/min (ref >60)
Glucose, Bld: 75 mg/dL (ref 70–99)
Potassium: 3.7 mmol/L (ref 3.5–5.1)
Sodium: 139 mmol/L (ref 135–145)

## 2023-11-29 ENCOUNTER — Encounter: Payer: Self-pay | Admitting: Nurse Practitioner

## 2023-11-29 ENCOUNTER — Ambulatory Visit: Attending: Nurse Practitioner | Admitting: Nurse Practitioner

## 2023-11-29 VITALS — BP 118/64 | HR 77 | Ht 62.0 in | Wt 179.8 lb

## 2023-11-29 DIAGNOSIS — I10 Essential (primary) hypertension: Secondary | ICD-10-CM | POA: Diagnosis not present

## 2023-11-29 DIAGNOSIS — R931 Abnormal findings on diagnostic imaging of heart and coronary circulation: Secondary | ICD-10-CM

## 2023-11-29 DIAGNOSIS — E785 Hyperlipidemia, unspecified: Secondary | ICD-10-CM | POA: Diagnosis not present

## 2023-11-29 DIAGNOSIS — I480 Paroxysmal atrial fibrillation: Secondary | ICD-10-CM | POA: Diagnosis present

## 2023-11-29 DIAGNOSIS — R600 Localized edema: Secondary | ICD-10-CM

## 2023-11-29 NOTE — Patient Instructions (Addendum)

## 2023-11-29 NOTE — Progress Notes (Unsigned)
 Cardiology Office Note:  .   Date:  08/29/2023 ID:  Donna Barron, DOB May 30, 1941, MRN [id number] PCP: Donna Crease, NP  [facility] Providers Cardiologist:  Donna Lander, MD    History of Present Illness: .   Donna Barron is a 83 y.o. female with a PMH of A-fib, hypertension, hyperlipidemia, GERD, anxiety, depression, who presents today for scheduled follow-up.  Last seen by Dr. Armida Barron on May 11, 2022 via telephone visit.  She was overall doing very well at this time.  Today she presents for scheduled follow-up with her daughter Donna Barron. Pt is not a reliable historian due to fatigue noted on exam. Pt overall is doing well. Daughter states pt has swelling to BLE. She also reports fatigue and is not sleeping well per daughter's report. Admits to weakness, pt is mainly sedentary and in wheelchair during office visit today. Says she had a recent seizure, now doing better on current medication regimen. Denies any recent falls, but when having weakness has to sit down in wheelchair to prevent from falling. Just had labs performed with [facility] and are overall benign.   Hospitalized in May 2025 due to acute metabolic encephalopathy secondary to UTI versus subclinical seizures.  Received treatment for E. coli UTI, was awaiting SNF placement.  Discharged to SNF.  Today she presents for scheduled follow-up.  She states...  ROS: Negative. See HPI.   Studies Reviewed: Donna Barron    EKG: EKG is not ordered today.   Echo 07/2021:  1. Left ventricular ejection fraction, by estimation, is 65 to 70%. The  left ventricle has normal function. The left ventricle has no regional  wall motion abnormalities. There is severe left ventricular hypertrophy.  Left ventricular diastolic parameters   are consistent with Grade I diastolic dysfunction (impaired relaxation).   2. Right ventricular systolic function is normal. The right ventricular  size is normal. Tricuspid regurgitation  signal is inadequate for assessing  PA pressure.   3. The mitral valve is abnormal. No evidence of mitral valve  regurgitation. No evidence of mitral stenosis. Moderate mitral annular  calcification.   4. The aortic valve is tricuspid. Aortic valve regurgitation is not  visualized. No aortic stenosis is present.   5. The inferior vena cava is normal in size with greater than 50%  respiratory variability, suggesting right atrial pressure of 3 mmHg.   Comparison(s): LVEF 60-65%.   Physical Exam:   VS:  There were no vitals taken for this visit.   Wt Readings from Last 3 Encounters:  11/17/23 193 lb 3.2 oz (87.6 kg)  11/15/23 191 lb 6.4 oz (86.8 kg)  11/02/23 188 lb (85.3 kg)    GEN: Obese, 83 y.o. female in no acute distress, fatigued and sitting in wheelchair NECK: No JVD; No carotid bruits CARDIAC: S1/S2, RRR, no murmurs, rubs, gallops RESPIRATORY:  Clear and diminished to auscultation without rales, wheezing or rhonchi  ABDOMEN: Soft, non-tender, non-distended EXTREMITIES:  2-3+ pitting edema to BLE; No deformity   ASSESSMENT AND PLAN: .    A-fib Denies any tachycardia or palpitations. HR is well controlled today. Continue Eliquis  for stroke prevention. She is on appropriate dosage and denies any bleeding issues. Will provide refill of Eliquis .   HTN BP stable. Discussed to monitor BP at home at least 2 hours after medications and sitting for 5-10 minutes. Continue current medication regimen. Heart healthy diet and regular cardiovascular exercise as tolerated encouraged. Will provide refills per her request.   HLD  LDL 59 01/2023. Continue simvastatin  - will provide refill. Heart healthy diet and regular cardiovascular exercise encouraged.   Leg edema Possibly due to sedentary/vascular insufficiency. 2+ PT pulses on exam. Recommended to begin Lasix , however pt's daughter politely declines. Will obtain Echo to evaluate for HF and obtain BMET, proBNP and Mag. If kidney function  permits, plan to address beginning Lasix  at next OV. Low salt, heart healthy diet encouraged. Leg elevation PRN recommended.   5. Weakness  Most likely d/t age and deconditioning. Recommended to f/u with PCP for further evaluation and recommended PT.   Dispo: Will provide refills per her request. Follow-up with me/[facility] in 3 months or sooner if anything changes.   Signed, Lasalle Pointer, NP

## 2023-12-12 ENCOUNTER — Emergency Department (HOSPITAL_COMMUNITY)

## 2023-12-12 ENCOUNTER — Other Ambulatory Visit: Payer: Self-pay

## 2023-12-12 ENCOUNTER — Inpatient Hospital Stay (HOSPITAL_COMMUNITY)
Admission: EM | Admit: 2023-12-12 | Discharge: 2023-12-16 | DRG: 689 | Disposition: A | Discharge status: Skilled Nursing Facility | Attending: Internal Medicine | Admitting: Internal Medicine

## 2023-12-12 ENCOUNTER — Encounter (HOSPITAL_COMMUNITY): Payer: Self-pay

## 2023-12-12 DIAGNOSIS — Z860100 Personal history of colon polyps, unspecified: Secondary | ICD-10-CM

## 2023-12-12 DIAGNOSIS — I480 Paroxysmal atrial fibrillation: Secondary | ICD-10-CM | POA: Diagnosis not present

## 2023-12-12 DIAGNOSIS — Z6831 Body mass index (BMI) 31.0-31.9, adult: Secondary | ICD-10-CM

## 2023-12-12 DIAGNOSIS — E66811 Obesity, class 1: Secondary | ICD-10-CM | POA: Diagnosis present

## 2023-12-12 DIAGNOSIS — E876 Hypokalemia: Secondary | ICD-10-CM | POA: Diagnosis present

## 2023-12-12 DIAGNOSIS — N3000 Acute cystitis without hematuria: Secondary | ICD-10-CM | POA: Diagnosis not present

## 2023-12-12 DIAGNOSIS — R319 Hematuria, unspecified: Secondary | ICD-10-CM | POA: Diagnosis present

## 2023-12-12 DIAGNOSIS — F419 Anxiety disorder, unspecified: Secondary | ICD-10-CM | POA: Diagnosis present

## 2023-12-12 DIAGNOSIS — Z79899 Other long term (current) drug therapy: Secondary | ICD-10-CM

## 2023-12-12 DIAGNOSIS — R531 Weakness: Secondary | ICD-10-CM | POA: Diagnosis not present

## 2023-12-12 DIAGNOSIS — Z800 Family history of malignant neoplasm of digestive organs: Secondary | ICD-10-CM

## 2023-12-12 DIAGNOSIS — F0390 Unspecified dementia without behavioral disturbance: Secondary | ICD-10-CM | POA: Diagnosis present

## 2023-12-12 DIAGNOSIS — N390 Urinary tract infection, site not specified: Principal | ICD-10-CM | POA: Diagnosis present

## 2023-12-12 DIAGNOSIS — G9341 Metabolic encephalopathy: Secondary | ICD-10-CM | POA: Diagnosis present

## 2023-12-12 DIAGNOSIS — F32A Depression, unspecified: Secondary | ICD-10-CM | POA: Diagnosis present

## 2023-12-12 DIAGNOSIS — Z1621 Resistance to vancomycin: Secondary | ICD-10-CM | POA: Diagnosis present

## 2023-12-12 DIAGNOSIS — R410 Disorientation, unspecified: Secondary | ICD-10-CM

## 2023-12-12 DIAGNOSIS — E7800 Pure hypercholesterolemia, unspecified: Secondary | ICD-10-CM | POA: Diagnosis present

## 2023-12-12 DIAGNOSIS — F02B0 Dementia in other diseases classified elsewhere, moderate, without behavioral disturbance, psychotic disturbance, mood disturbance, and anxiety: Secondary | ICD-10-CM | POA: Diagnosis present

## 2023-12-12 DIAGNOSIS — Z7901 Long term (current) use of anticoagulants: Secondary | ICD-10-CM

## 2023-12-12 DIAGNOSIS — G309 Alzheimer's disease, unspecified: Secondary | ICD-10-CM | POA: Diagnosis present

## 2023-12-12 DIAGNOSIS — I10 Essential (primary) hypertension: Secondary | ICD-10-CM | POA: Diagnosis not present

## 2023-12-12 DIAGNOSIS — Z833 Family history of diabetes mellitus: Secondary | ICD-10-CM

## 2023-12-12 DIAGNOSIS — Z751 Person awaiting admission to adequate facility elsewhere: Secondary | ICD-10-CM

## 2023-12-12 DIAGNOSIS — R569 Unspecified convulsions: Secondary | ICD-10-CM | POA: Diagnosis present

## 2023-12-12 DIAGNOSIS — A4150 Gram-negative sepsis, unspecified: Secondary | ICD-10-CM | POA: Diagnosis present

## 2023-12-12 DIAGNOSIS — Z87440 Personal history of urinary (tract) infections: Secondary | ICD-10-CM

## 2023-12-12 DIAGNOSIS — Z83719 Family history of colon polyps, unspecified: Secondary | ICD-10-CM

## 2023-12-12 DIAGNOSIS — Z803 Family history of malignant neoplasm of breast: Secondary | ICD-10-CM

## 2023-12-12 DIAGNOSIS — Z888 Allergy status to other drugs, medicaments and biological substances status: Secondary | ICD-10-CM

## 2023-12-12 DIAGNOSIS — B952 Enterococcus as the cause of diseases classified elsewhere: Secondary | ICD-10-CM | POA: Diagnosis present

## 2023-12-12 DIAGNOSIS — Z8249 Family history of ischemic heart disease and other diseases of the circulatory system: Secondary | ICD-10-CM

## 2023-12-12 DIAGNOSIS — K219 Gastro-esophageal reflux disease without esophagitis: Secondary | ICD-10-CM | POA: Diagnosis present

## 2023-12-12 DIAGNOSIS — Z885 Allergy status to narcotic agent status: Secondary | ICD-10-CM

## 2023-12-12 DIAGNOSIS — Z801 Family history of malignant neoplasm of trachea, bronchus and lung: Secondary | ICD-10-CM

## 2023-12-12 LAB — CBC WITH DIFFERENTIAL/PLATELET
Abs Immature Granulocytes: 0.02 10*3/uL (ref 0.00–0.07)
Basophils Absolute: 0 10*3/uL (ref 0.0–0.1)
Basophils Relative: 1 %
Eosinophils Absolute: 0 10*3/uL (ref 0.0–0.5)
Eosinophils Relative: 1 %
HCT: 52 % — ABNORMAL HIGH (ref 36.0–46.0)
Hemoglobin: 17.1 g/dL — ABNORMAL HIGH (ref 12.0–15.0)
Immature Granulocytes: 0 %
Lymphocytes Relative: 20 %
Lymphs Abs: 1.6 10*3/uL (ref 0.7–4.0)
MCH: 31.2 pg (ref 26.0–34.0)
MCHC: 32.9 g/dL (ref 30.0–36.0)
MCV: 94.9 fL (ref 80.0–100.0)
Monocytes Absolute: 1.1 10*3/uL — ABNORMAL HIGH (ref 0.1–1.0)
Monocytes Relative: 14 %
Neutro Abs: 5.1 10*3/uL (ref 1.7–7.7)
Neutrophils Relative %: 64 %
Platelets: 240 10*3/uL (ref 150–400)
RBC: 5.48 MIL/uL — ABNORMAL HIGH (ref 3.87–5.11)
RDW: 14.4 % (ref 11.5–15.5)
WBC: 7.9 10*3/uL (ref 4.0–10.5)
nRBC: 0 % (ref 0.0–0.2)

## 2023-12-12 LAB — COMPREHENSIVE METABOLIC PANEL WITH GFR
ALT: 11 U/L (ref 0–44)
AST: 24 U/L (ref 15–41)
Albumin: 3.2 g/dL — ABNORMAL LOW (ref 3.5–5.0)
Alkaline Phosphatase: 76 U/L (ref 38–126)
Anion gap: 12 (ref 5–15)
BUN: 11 mg/dL (ref 8–23)
CO2: 30 mmol/L (ref 22–32)
Calcium: 9.5 mg/dL (ref 8.9–10.3)
Chloride: 96 mmol/L — ABNORMAL LOW (ref 98–111)
Creatinine, Ser: 0.72 mg/dL (ref 0.44–1.00)
GFR, Estimated: >60 mL/min (ref >60)
Glucose, Bld: 96 mg/dL (ref 70–99)
Potassium: 3.4 mmol/L — ABNORMAL LOW (ref 3.5–5.1)
Sodium: 138 mmol/L (ref 135–145)
Total Bilirubin: 0.8 mg/dL (ref 0.0–1.2)
Total Protein: 6.8 g/dL (ref 6.5–8.1)

## 2023-12-12 LAB — URINALYSIS, W/ REFLEX TO CULTURE (INFECTION SUSPECTED)
Bilirubin Urine: NEGATIVE
Glucose, UA: NEGATIVE mg/dL
Ketones, ur: 5 mg/dL — AB
Nitrite: NEGATIVE
Protein, ur: 100 mg/dL — AB
RBC / HPF: >50 RBC/hpf (ref 0–5)
Specific Gravity, Urine: 1.026 (ref 1.005–1.030)
WBC, UA: >50 WBC/hpf (ref 0–5)
pH: 5 (ref 5.0–8.0)

## 2023-12-12 LAB — TROPONIN I (HIGH SENSITIVITY)
Troponin I (High Sensitivity): 4 ng/L (ref <18)
Troponin I (High Sensitivity): 4 ng/L (ref <18)

## 2023-12-12 LAB — MAGNESIUM: Magnesium: 1.9 mg/dL (ref 1.7–2.4)

## 2023-12-12 LAB — BRAIN NATRIURETIC PEPTIDE: B Natriuretic Peptide: 37 pg/mL (ref 0.0–100.0)

## 2023-12-12 LAB — TSH: TSH: 1.858 u[IU]/mL (ref 0.350–4.500)

## 2023-12-12 MED ORDER — ACETAMINOPHEN 325 MG PO TABS: 650.0000 mg | ORAL_TABLET | Freq: Four times a day (QID) | ORAL | Status: DC | PRN

## 2023-12-12 MED ORDER — PANTOPRAZOLE SODIUM 40 MG PO TBEC
40.0000 mg | DELAYED_RELEASE_TABLET | Freq: Every day | ORAL | Status: DC
  Administered 2023-12-12 – 2023-12-16 (×5): 40 mg via ORAL
  Filled 2023-12-12 (×5): qty 1

## 2023-12-12 MED ORDER — ESCITALOPRAM OXALATE 10 MG PO TABS
20.0000 mg | ORAL_TABLET | Freq: Every day | ORAL | Status: DC
  Administered 2023-12-12 – 2023-12-15 (×4): 20 mg via ORAL
  Filled 2023-12-12 (×3): qty 2

## 2023-12-12 MED ORDER — IOHEXOL 300 MG/ML  SOLN
100.0000 mL | Freq: Once | INTRAMUSCULAR | Status: AC | PRN
Start: 2023-12-12 — End: 2023-12-12
  Administered 2023-12-12: 100 mL via INTRAVENOUS

## 2023-12-12 MED ORDER — ENSURE PLUS HIGH PROTEIN PO LIQD
237.0000 mL | Freq: Two times a day (BID) | ORAL | Status: DC
  Administered 2023-12-13 – 2023-12-16 (×7): 237 mL via ORAL

## 2023-12-12 MED ORDER — ONDANSETRON HCL 4 MG PO TABS: 4.0000 mg | ORAL_TABLET | Freq: Four times a day (QID) | ORAL | Status: DC | PRN

## 2023-12-12 MED ORDER — LACTATED RINGERS IV BOLUS
1000.0000 mL | Freq: Once | INTRAVENOUS | Status: AC
  Administered 2023-12-12: 1000 mL via INTRAVENOUS

## 2023-12-12 MED ORDER — FLUCONAZOLE 100 MG PO TABS
100.0000 mg | ORAL_TABLET | Freq: Once | ORAL | Status: AC
  Administered 2023-12-12: 100 mg via ORAL
  Filled 2023-12-12: qty 1

## 2023-12-12 MED ORDER — FLUCONAZOLE 100 MG PO TABS: 100.0000 mg | ORAL_TABLET | Freq: Every day | ORAL | Status: DC

## 2023-12-12 MED ORDER — SODIUM CHLORIDE 0.9 % IV SOLN
1.0000 g | Freq: Once | INTRAVENOUS | Status: AC
  Administered 2023-12-12: 1 g via INTRAVENOUS
  Filled 2023-12-12: qty 10

## 2023-12-12 MED ORDER — POTASSIUM CHLORIDE CRYS ER 20 MEQ PO TBCR
40.0000 meq | EXTENDED_RELEASE_TABLET | Freq: Once | ORAL | Status: AC
  Administered 2023-12-12: 40 meq via ORAL
  Filled 2023-12-12: qty 2

## 2023-12-12 MED ORDER — POTASSIUM CHLORIDE IN NACL 20-0.9 MEQ/L-% IV SOLN: INTRAVENOUS | Status: AC

## 2023-12-12 MED ORDER — OXCARBAZEPINE 150 MG PO TABS
150.0000 mg | ORAL_TABLET | Freq: Two times a day (BID) | ORAL | Status: DC
  Administered 2023-12-12 – 2023-12-16 (×8): 150 mg via ORAL
  Filled 2023-12-12 (×10): qty 1

## 2023-12-12 MED ORDER — ACETAMINOPHEN 650 MG RE SUPP
650.0000 mg | Freq: Four times a day (QID) | RECTAL | Status: DC | PRN
Start: 2023-12-12 — End: 2023-12-16

## 2023-12-12 MED ORDER — APIXABAN 5 MG PO TABS
5.0000 mg | ORAL_TABLET | Freq: Two times a day (BID) | ORAL | Status: DC
  Administered 2023-12-12 – 2023-12-16 (×8): 5 mg via ORAL
  Filled 2023-12-12 (×8): qty 1

## 2023-12-12 MED ORDER — METOPROLOL SUCCINATE ER 50 MG PO TB24
75.0000 mg | ORAL_TABLET | Freq: Every day | ORAL | Status: DC
  Administered 2023-12-13 – 2023-12-16 (×4): 75 mg via ORAL
  Filled 2023-12-12 (×4): qty 1

## 2023-12-12 MED ORDER — DIVALPROEX SODIUM ER 500 MG PO TB24
500.0000 mg | ORAL_TABLET | Freq: Every day | ORAL | Status: DC
  Administered 2023-12-12 – 2023-12-15 (×4): 500 mg via ORAL
  Filled 2023-12-12 (×4): qty 1

## 2023-12-12 MED ORDER — POLYETHYLENE GLYCOL 3350 17 G PO PACK: 17.0000 g | PACK | Freq: Every day | ORAL | Status: DC | PRN

## 2023-12-12 MED ORDER — ONDANSETRON HCL 4 MG/2ML IJ SOLN: 4.0000 mg | Freq: Four times a day (QID) | INTRAMUSCULAR | Status: DC | PRN

## 2023-12-12 MED ORDER — SODIUM CHLORIDE 0.9 % IV SOLN
1.0000 g | INTRAVENOUS | Status: DC
  Administered 2023-12-13: 1 g via INTRAVENOUS
  Filled 2023-12-12: qty 10

## 2023-12-12 MED ORDER — ENOXAPARIN SODIUM 40 MG/0.4ML IJ SOSY: 40.0000 mg | PREFILLED_SYRINGE | INTRAMUSCULAR | Status: DC

## 2023-12-12 NOTE — Assessment & Plan Note (Addendum)
 Currently in sinus rhythm.  On anticoagulation. - Resume metoprolol  and Eliquis .

## 2023-12-12 NOTE — ED Triage Notes (Signed)
 Pt daughter states pt went to [facility] rehab following a UTI and then after being home about a week has now began not being able to walk any sp daughter wants her checked for possible recurring UTI.

## 2023-12-12 NOTE — Assessment & Plan Note (Signed)
 Stable. - resume metoprolol 

## 2023-12-12 NOTE — H&P (Addendum)
 History and Physical    Donna Barron FMW:[id number] DOB: 1940/06/20 DOA: 12/12/2023  PCP: [name], NP   Patient coming from: Home  I have personally briefly reviewed patient's old medical records in [facility]  Chief Complaint: Weakness  HPI: Donna Barron is a 83 y.o. female with medical history significant for hypertension, Alzheimer's dementia, paroxysmal atrial fibrillation. Patient was brought to the ED by daughter with reports of intermittent episodes of confusion, generalized weakness, lower abdominal pain and complaints of dysuria over the past 5 to 6 days.  Patient was admitted 5/1 to 5/5 for encephalopathy secondary to urinary tract infection versus subclinical seizures.  Treated with IV ceftriaxone  and was discharged to [facility].   Patient was discharged back home 3 weeks ago and was doing very well, able to ambulate, even bathe herself standing.  Over the past 5 to 6 days, patient has not been able to ambulate, has been progressively weak.  Symptoms this time and not as severe as prior.  She has also had poor oral intake.  No vomiting or diarrhea.  ED Course: Temperature 98.1.  Heart rate 61-77.  Respirate rate 18-20.  Blood pressure [phone].  O2 sats greater than 93% on room air.  WBC 7.9.  UA with small leukocytes few bacteria, yeast. CT chest abdomen and pelvis with contrast negative for acute abnormality. Chest x-ray clear. IV ceftriaxone  started for UTI, also Diflucan  for yeast on UA. 1 L bolus given.  Review of Systems: As per HPI all other systems reviewed and negative.  Past Medical History:  Diagnosis Date   Anxiety    Cataract    Colon polyps    Dementia (HCC)    Depression    Dysrhythmia    heart palipations   Gallstones    GERD (gastroesophageal reflux disease)    HTN (hypertension)    Hypercholesteremia    PONV (postoperative nausea and vomiting)     Past Surgical History:  Procedure Laterality Date   APPENDECTOMY      ARTHROSCOPIC REPAIR ACL  05/2008   Left knee   CARPAL TUNNEL RELEASE Right 07/14/2017   Procedure: CARPAL TUNNEL RELEASE;  Surgeon: Margrette Taft BRAVO, MD;  Location: [facility];  Service: Orthopedics;  Laterality: Right;   CATARACT EXTRACTION, BILATERAL  2020   CHOLECYSTECTOMY     DILATION AND CURETTAGE OF UTERUS     DORSAL COMPARTMENT RELEASE Right 07/14/2017   Procedure: RELEASE DORSAL COMPARTMENT (DEQUERVAIN);  Surgeon: Margrette Taft BRAVO, MD;  Location: [facility];  Service: Orthopedics;  Laterality: Right;   HIATAL HERNIA REPAIR N/A 03/24/2020   Procedure: LAPAROSCOPIC REPAIR OF HIATAL HERNIA, nissen fundoplication;  Surgeon: Gladis Cough, MD;  Location: [facility];  Service: General;  Laterality: N/A;   HYSTEROSCOPY WITH D & C N/A 12/25/2013   Procedure: DILATATION AND CURETTAGE /HYSTEROSCOPY;  Surgeon: Peggye Gull, MD;  Location: [facility];  Service: Gynecology;  Laterality: N/A;   SHOULDER OPEN ROTATOR CUFF REPAIR Left 06/15/2018   Procedure: ROTATOR CUFF REPAIR SHOULDER OPEN WITH ACROMIOPLASTY;  Surgeon: Margrette Taft BRAVO, MD;  Location: [facility];  Service: Orthopedics;  Laterality: Left;   TUBAL LIGATION       reports that she has never smoked. She has never used smokeless tobacco. She reports that she does not currently use alcohol. She reports that she does not use drugs.  Allergies  Allergen Reactions   Codeine  Swelling    Swelling of lips   Hydrocodone  Swelling    Lip  swelling   Avelox [Moxifloxacin Hcl In Nacl] Other (See Comments)    Insomnia    Oxybutynin Palpitations and Other (See Comments)    Tongue felt coated     Family History  Problem Relation Age of Onset   Heart failure Mother 108   Lung cancer Father 67   Breast cancer Sister    Diabetes Sister    Colon cancer Brother    Colon polyps Son    Colon polyps Daughter     Prior to Admission medications   Medication Sig Start Date End Date Taking? Authorizing Provider  apixaban  (ELIQUIS ) 5 MG TABS tablet Take 1 tablet  (5 mg total) by mouth 2 (two) times daily. 11/17/23   [name], NP  bisacodyl  (DULCOLAX) 5 MG EC tablet Take 1 tablet (5 mg total) by mouth daily as needed for moderate constipation. 10/17/23 10/16/24  Maree, Pratik D, DO  divalproex  (DEPAKOTE  ER) 250 MG 24 hr tablet Take 2 tablets every night 11/17/23   [name], NP  escitalopram  (LEXAPRO ) 20 MG tablet Take 1 tablet (20 mg total) by mouth at bedtime. 11/17/23   [name], NP  furosemide  (LASIX ) 40 MG tablet Take 1 tablet (40 mg total) by mouth daily. 11/17/23   [name], NP  melatonin 5 MG TABS Take 5 mg by mouth at bedtime.    [provider]  metoprolol  succinate (TOPROL -XL) 50 MG 24 hr tablet Take 1.5 tablets (75 mg total) by mouth daily. 11/17/23 05/15/24  [name], NP  nystatin  cream (MYCOSTATIN ) Apply 1 Application topically 2 (two) times daily as needed (yeast). 11/17/23   [name], NP  omeprazole (PRILOSEC) 40 MG capsule Take 40 mg by mouth daily.    [provider]  OXcarbazepine  (TRILEPTAL ) 150 MG tablet Take 1 tablet (150 mg total) by mouth 2 (two) times daily. 11/17/23   [name], NP  potassium chloride  SA (KLOR-CON  M) 20 MEQ tablet Take 1 tablet (20 mEq total) by mouth daily. 11/17/23   [name], NP  simvastatin  (ZOCOR ) 40 MG tablet Take 1 tablet (40 mg total) by mouth at bedtime. 11/17/23   [name], NP  Vitamin D , Ergocalciferol , (DRISDOL ) 1.25 MG (50000 UNIT) CAPS capsule Take 1 capsule (50,000 Units total) by mouth once a week. 11/17/23   [name], NP    Physical Exam: Vitals:   12/12/23 0900 12/12/23 1004 12/12/23 1009  BP:   136/68  Pulse:   77  Resp:   20  Temp: 98.1 F (36.7 C)    TempSrc: Oral    SpO2:   93%  Weight:  78 kg   Height:  5' 2 (1.575 m)     Constitutional: NAD, calm, comfortable Vitals:   12/12/23 0900 12/12/23 1004 12/12/23 1009  BP:   136/68  Pulse:   77  Resp:   20  Temp: 98.1 F (36.7 C)    TempSrc: Oral    SpO2:   93%   Weight:  78 kg   Height:  5' 2 (1.575 m)    Eyes: PERRL, lids and conjunctivae normal ENMT: Mucous membranes are moist.  Neck: normal, supple, no masses, no thyromegaly Respiratory: clear to auscultation bilaterally, no wheezing, no crackles. Normal respiratory effort. No accessory muscle use.  Cardiovascular: Regular rate and rhythm, no murmurs / rubs / gallops. No extremity edema.   Abdomen: no tenderness, no masses palpated. No hepatosplenomegaly. Bowel sounds positive.  Musculoskeletal: no  clubbing / cyanosis. No joint deformity upper and lower extremities. Good ROM, no contractures. Normal muscle tone.  Skin: no rashes, lesions, ulcers. No induration Neurologic: 3/5 strength bilateral lower extremities, okay equal grip strength, generally weak. Psychiatric: Mental status at baseline, awake and alert, able to answer questions appropriately..   Labs on Admission: I have personally reviewed following labs and imaging studies  CBC: Recent Labs  Lab 12/12/23 1127  WBC 7.9  NEUTROABS 5.1  HGB 17.1*  HCT 52.0*  MCV 94.9  PLT 240   Basic Metabolic Panel: Recent Labs  Lab 12/12/23 1127  NA 138  K 3.4*  CL 96*  CO2 30  GLUCOSE 96  BUN 11  CREATININE 0.72  CALCIUM 9.5  MG 1.9   GFR: Estimated Creatinine Clearance: 52.5 mL/min (by C-G formula based on SCr of 0.72 mg/dL). Liver Function Tests: Recent Labs  Lab 12/12/23 1127  AST 24  ALT 11  ALKPHOS 76  BILITOT 0.8  PROT 6.8  ALBUMIN 3.2*   Urine analysis:    Component Value Date/Time   COLORURINE YELLOW 12/12/2023 1154   APPEARANCEUR TURBID (A) 12/12/2023 1154   LABSPEC 1.026 12/12/2023 1154   PHURINE 5.0 12/12/2023 1154   GLUCOSEU NEGATIVE 12/12/2023 1154   HGBUR MODERATE (A) 12/12/2023 1154   BILIRUBINUR NEGATIVE 12/12/2023 1154   KETONESUR 5 (A) 12/12/2023 1154   PROTEINUR 100 (A) 12/12/2023 1154   NITRITE NEGATIVE 12/12/2023 1154   LEUKOCYTESUR SMALL (A) 12/12/2023 1154    Radiological Exams on  Admission: CT ABDOMEN PELVIS W CONTRAST Result Date: 12/12/2023 CLINICAL DATA:  Abdominal pain possible UTI EXAM: CT ABDOMEN AND PELVIS WITH CONTRAST TECHNIQUE: Multidetector CT imaging of the abdomen and pelvis was performed using the standard protocol following bolus administration of intravenous contrast. RADIATION DOSE REDUCTION: This exam was performed according to the departmental dose-optimization program which includes automated exposure control, adjustment of the mA and/or kV according to patient size and/or use of iterative reconstruction technique. CONTRAST:  OMNIPAQUE  IOHEXOL  300 MG/ML  SOLN COMPARISON:  CT 07/18/2020 FINDINGS: Lower chest: Lung bases demonstrate no acute airspace disease. Dense mitral calcification. Coronary vascular calcification. Small hiatal hernia Hepatobiliary: No focal liver abnormality is seen. Status post cholecystectomy. No biliary dilatation. Pancreas: Unremarkable. No pancreatic ductal dilatation or surrounding inflammatory changes. Spleen: Normal in size without focal abnormality. Adrenals/Urinary Tract: Adrenal glands are unremarkable. Kidneys are normal, without renal calculi, focal lesion, or hydronephrosis. Bladder is unremarkable. Stomach/Bowel: Status post fundoplication. No dilated small bowel. No acute bowel wall thickening Vascular/Lymphatic: Aortic atherosclerosis. No enlarged abdominal or pelvic lymph nodes. Retroaortic left renal vein. Reproductive: Uterus and bilateral adnexa are unremarkable. 1 cm right adnexal cyst for which no imaging follow-up is recommended. Other: No abdominal wall hernia or abnormality. No abdominopelvic ascites. Musculoskeletal: No acute or suspicious osseous abnormality. Grade 1 anterolisthesis L3 on L4. Multilevel degenerative changes IMPRESSION: 1. No CT evidence for acute intra-abdominal or pelvic abnormality. 2. Status post fundoplication with small hiatal hernia. 3. Aortic atherosclerosis. Aortic Atherosclerosis  (ICD10-I70.0). Electronically Signed   By: Luke Bun M.D.   On: 12/12/2023 15:06   DG Chest Port 1 View Result Date: 12/12/2023 CLINICAL DATA:  Sepsis. EXAM: PORTABLE CHEST 1 VIEW COMPARISON:  Chest radiograph dated 10/22/2022. FINDINGS: No focal consolidation, pleural effusion, or pneumothorax. The cardiac silhouette is within normal limits. No acute osseous pathology. IMPRESSION: No active disease. Electronically Signed   By: Vanetta Chou M.D.   On: 12/12/2023 10:55   EKG: Independently reviewed.  Sinus  rhythm, rate 74, QTc 451.  Mostly nonspecific changes not significantly changed from prior and leads to 3 aVF V3 through V6.  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  Presenting this time with generalized weakness, intermittent confusion, poor oral intake.  On my evaluation, mental status appears to be at baseline.  Concern for UTI, with UA showing small leukocytes few bacteria and yeast.  Rules out for sepsis.  Recent hospitalization for same, also concern for subclinical seizures.  Urine cultures grew E. coli, sensitive to ceftriaxone . - Continue IV ceftriaxone  -Follow-up urine cultures - Resume Lexapro , Trileptal  - Continue Diflucan  100 mg daily - 1 L bolus given, continue N/s + 20 KCL 75cc/hr x 12 hrs -Resume further medications pending med rec  Paroxysmal atrial fibrillation (HCC) Currently in sinus rhythm.  On anticoagulation. - Resume metoprolol  and Eliquis .  Essential hypertension Stable. - resume metoprolol    Family request changing p.o. potassium pills to liquid on discharge.  As the pills are hard to swallow.  DVT prophylaxis: Eliquis  Code Status:  FULL-confirmed with patient and daughter-Tracy at bedside Family Communication: Daughter [name] at bedside Disposition Plan:  ~ 2 days Consults called: None Admission status:  obs med surg    Author: Tully FORBES Carwin, MD 12/12/2023 5:03 PM  For on call review [email or web address].

## 2023-12-12 NOTE — ED Provider Notes (Signed)
 [place] EMERGENCY DEPARTMENT AT [facility] Provider Note   CSN: [id number] Arrival date & time: 12/12/23  9055     Patient presents with: Weakness   Donna Barron is a 83 y.o. female.    Weakness Associated symptoms: dysuria, nausea and vomiting   Patient presents for generalized weakness and intermittent confusion.  Medical history includes HLD, HTN, obesity, neurocognitive disorder, atrial fibrillation, GERD, depression, anxiety.  2 months ago, she was admitted for encephalopathy in the setting of UTI.  She was discharged to [facility] rehab facility.  She returned home about 3 weeks ago.  At baseline, she is able to ambulate with a walker.  For the past 5 days, she has had generalized weakness and has not been able to walk, even with two-person assistance.  Daughter reports intermittent confusion.  Patient had some nausea and vomiting 2 days ago.  She denies any nausea currently.  She has chronic pain in her bilateral legs which has not worsened recently.  She does report some recent recurrence of dysuria.  Daughter reports absence of urine in her diaper this morning.  There is concern of dehydration.  Although she has slid to the ground due to her generalized weakness over the past several days, she has not had any recent falls.     Prior to Admission medications   Medication Sig Start Date End Date Taking? Authorizing Provider  apixaban  (ELIQUIS ) 5 MG TABS tablet Take 1 tablet (5 mg total) by mouth 2 (two) times daily. 11/17/23   [name], NP  bisacodyl  (DULCOLAX) 5 MG EC tablet Take 1 tablet (5 mg total) by mouth daily as needed for moderate constipation. 10/17/23 10/16/24  Maree, Pratik D, DO  divalproex  (DEPAKOTE  ER) 250 MG 24 hr tablet Take 2 tablets every night 11/17/23   [name], NP  escitalopram  (LEXAPRO ) 20 MG tablet Take 1 tablet (20 mg total) by mouth at bedtime. 11/17/23   [name], NP  furosemide  (LASIX ) 40 MG tablet Take 1 tablet (40 mg  total) by mouth daily. 11/17/23   [name], NP  melatonin 5 MG TABS Take 5 mg by mouth at bedtime.    [provider]  metoprolol  succinate (TOPROL -XL) 50 MG 24 hr tablet Take 1.5 tablets (75 mg total) by mouth daily. 11/17/23 05/15/24  [name], NP  nystatin  cream (MYCOSTATIN ) Apply 1 Application topically 2 (two) times daily as needed (yeast). 11/17/23   [name], NP  omeprazole (PRILOSEC) 40 MG capsule Take 40 mg by mouth daily.    [provider]  OXcarbazepine  (TRILEPTAL ) 150 MG tablet Take 1 tablet (150 mg total) by mouth 2 (two) times daily. 11/17/23   [name], NP  potassium chloride  SA (KLOR-CON  M) 20 MEQ tablet Take 1 tablet (20 mEq total) by mouth daily. 11/17/23   [name], NP  simvastatin  (ZOCOR ) 40 MG tablet Take 1 tablet (40 mg total) by mouth at bedtime. 11/17/23   [name], NP  Vitamin D , Ergocalciferol , (DRISDOL ) 1.25 MG (50000 UNIT) CAPS capsule Take 1 capsule (50,000 Units total) by mouth once a week. 11/17/23   [name], NP    Allergies: Codeine , Hydrocodone , Avelox [moxifloxacin hcl in nacl], and Oxybutynin    Review of Systems  Gastrointestinal:  Positive for nausea and vomiting.  Genitourinary:  Positive for decreased urine volume and dysuria.  Neurological:  Positive for weakness (Generalized).  Psychiatric/Behavioral:  Positive for confusion.  All other systems reviewed and are negative.   Updated Vital Signs BP 136/68 (BP Location: Left Arm)   Pulse 77   Temp 98.1 F (36.7 C) (Oral)   Resp 20   Ht 5' 2 (1.575 m)   Wt 78 kg   SpO2 93%   BMI 31.46 kg/m   Physical Exam Vitals and nursing note reviewed.  Constitutional:      General: She is not in acute distress.    Appearance: Normal appearance. She is well-developed. She is not ill-appearing, toxic-appearing or diaphoretic.  HENT:     Head: Normocephalic and atraumatic.     Right Ear: External ear normal.     Left Ear: External ear normal.      Nose: Nose normal.     Mouth/Throat:     Mouth: Mucous membranes are moist.   Eyes:     Extraocular Movements: Extraocular movements intact.     Conjunctiva/sclera: Conjunctivae normal.    Cardiovascular:     Rate and Rhythm: Normal rate and regular rhythm.  Pulmonary:     Effort: Pulmonary effort is normal. No respiratory distress.  Abdominal:     General: There is no distension.     Palpations: Abdomen is soft.     Tenderness: There is abdominal tenderness (Suprapubic). There is no guarding or rebound.   Musculoskeletal:        General: No swelling. Normal range of motion.     Cervical back: Normal range of motion and neck supple.   Skin:    General: Skin is warm and dry.     Coloration: Skin is not jaundiced or pale.   Neurological:     General: No focal deficit present.     Mental Status: She is alert. She is disoriented.   Psychiatric:        Mood and Affect: Mood normal.        Behavior: Behavior normal.     (all labs ordered are listed, but only abnormal results are displayed) Labs Reviewed  COMPREHENSIVE METABOLIC PANEL WITH GFR - Abnormal; Notable for the following components:      Result Value   Potassium 3.4 (*)    Chloride 96 (*)    Albumin 3.2 (*)    All other components within normal limits  CBC WITH DIFFERENTIAL/PLATELET - Abnormal; Notable for the following components:   RBC 5.48 (*)    Hemoglobin 17.1 (*)    HCT 52.0 (*)    Monocytes Absolute 1.1 (*)    All other components within normal limits  URINALYSIS, W/ REFLEX TO CULTURE (INFECTION SUSPECTED) - Abnormal; Notable for the following components:   APPearance TURBID (*)    Hgb urine dipstick MODERATE (*)    Ketones, ur 5 (*)    Protein, ur 100 (*)    Leukocytes,Ua SMALL (*)    Bacteria, UA FEW (*)    All other components within normal limits  URINE CULTURE  MAGNESIUM   BRAIN NATRIURETIC PEPTIDE  TSH  TROPONIN I (HIGH SENSITIVITY)  TROPONIN I (HIGH SENSITIVITY)     EKG: None  Radiology: CT ABDOMEN PELVIS W CONTRAST Result Date: 12/12/2023 CLINICAL DATA:  Abdominal pain possible UTI EXAM: CT ABDOMEN AND PELVIS WITH CONTRAST TECHNIQUE: Multidetector CT imaging of the abdomen and pelvis was performed using the standard protocol following bolus administration of intravenous contrast. RADIATION DOSE REDUCTION: This exam was performed according to the departmental dose-optimization program which includes automated exposure control, adjustment of the mA and/or kV according to patient  size and/or use of iterative reconstruction technique. CONTRAST:  OMNIPAQUE  IOHEXOL  300 MG/ML  SOLN COMPARISON:  CT 07/18/2020 FINDINGS: Lower chest: Lung bases demonstrate no acute airspace disease. Dense mitral calcification. Coronary vascular calcification. Small hiatal hernia Hepatobiliary: No focal liver abnormality is seen. Status post cholecystectomy. No biliary dilatation. Pancreas: Unremarkable. No pancreatic ductal dilatation or surrounding inflammatory changes. Spleen: Normal in size without focal abnormality. Adrenals/Urinary Tract: Adrenal glands are unremarkable. Kidneys are normal, without renal calculi, focal lesion, or hydronephrosis. Bladder is unremarkable. Stomach/Bowel: Status post fundoplication. No dilated small bowel. No acute bowel wall thickening Vascular/Lymphatic: Aortic atherosclerosis. No enlarged abdominal or pelvic lymph nodes. Retroaortic left renal vein. Reproductive: Uterus and bilateral adnexa are unremarkable. 1 cm right adnexal cyst for which no imaging follow-up is recommended. Other: No abdominal wall hernia or abnormality. No abdominopelvic ascites. Musculoskeletal: No acute or suspicious osseous abnormality. Grade 1 anterolisthesis L3 on L4. Multilevel degenerative changes IMPRESSION: 1. No CT evidence for acute intra-abdominal or pelvic abnormality. 2. Status post fundoplication with small hiatal hernia. 3. Aortic atherosclerosis. Aortic  Atherosclerosis (ICD10-I70.0). Electronically Signed   By: Luke Bun M.D.   On: 12/12/2023 15:06   DG Chest Port 1 View Result Date: 12/12/2023 CLINICAL DATA:  Sepsis. EXAM: PORTABLE CHEST 1 VIEW COMPARISON:  Chest radiograph dated 10/22/2022. FINDINGS: No focal consolidation, pleural effusion, or pneumothorax. The cardiac silhouette is within normal limits. No acute osseous pathology. IMPRESSION: No active disease. Electronically Signed   By: Vanetta Chou M.D.   On: 12/12/2023 10:55     Procedures   Medications Ordered in the ED  potassium chloride  SA (KLOR-CON  M) CR tablet 40 mEq (has no administration in time range)  lactated ringers  bolus 1,000 mL (0 mLs Intravenous Stopped 12/12/23 1255)  cefTRIAXone  (ROCEPHIN ) 1 g in sodium chloride  0.9 % 100 mL IVPB (1 g Intravenous New Bag/Given 12/12/23 1430)  iohexol  (OMNIPAQUE ) 300 MG/ML solution 100 mL (100 mLs Intravenous Contrast Given 12/12/23 1427)  fluconazole  (DIFLUCAN ) tablet 100 mg (100 mg Oral Given 12/12/23 1515)                                    Medical Decision Making Amount and/or Complexity of Data Reviewed Labs: ordered. Radiology: ordered.  Risk Prescription drug management.   This patient presents to the ED for concern of altered mental status, generalized weakness, this involves an extensive number of treatment options, and is a complaint that carries with it a high risk of complications and morbidity.  The differential diagnosis includes infection, deconditioning, dehydration, metabolic derangements, polypharmacy   Co morbidities / Chronic conditions that complicate the patient evaluation  HLD, HTN, obesity, neurocognitive disorder, atrial fibrillation, GERD, depression, anxiety   Additional history obtained:  Additional history obtained from EMR External records from outside source obtained and reviewed including patient's daughter   Lab Tests:  I Ordered, and personally interpreted labs.  The  pertinent results include: Increased hemoglobin from baseline suggesting hemoconcentration; no leukocytosis is present.  Kidney function is normal.  There is a mild hypokalemia with otherwise normal electrolytes.  Urinalysis shows evidence of UTI.  Budding yeast present as well.   Imaging Studies ordered:  I ordered imaging studies including chest x-ray, CT of abdomen and pelvis I independently visualized and interpreted imaging which showed no acute findings I agree with the radiologist interpretation   Cardiac Monitoring: / EKG:  The patient was maintained on a cardiac  monitor.  I personally viewed and interpreted the cardiac monitored which showed an underlying rhythm of: Sinus rhythm   Problem List / ED Course / Critical interventions / Medication management  Patient presenting for generalized weakness.  She was admitted to the hospital 2 months ago for encephalopathy in the setting of UTI.  Daughters noticed worsening generalized weakness and intermittent confusion over the past 5 days.  She states that symptoms are reminiscent of prior UTI.  Patient endorses some recent dysuria and does have some suprapubic tenderness on exam.  Per chart review, prior urine cultures showed E. coli and Rothia that were sensitive to ceftriaxone .  Workup was initiated.  Given daughter's concern of dehydration, IV fluids ordered.  Patient denies any nausea or pain at this time.  Patient's urinalysis is consistent with UTI.  Ceftriaxone  and Diflucan  were ordered.  CT scan shows no acute findings.  Given her severe weakness and recent delirium, patient to be admitted for further management. I ordered medication including IV fluids for hydration, ceftriaxone  for UTI, Diflucan  for yeast present on urinalysis, potassium chloride  for hypokalemia Reevaluation of the patient after these medicines showed that the patient improved I have reviewed the patients home medicines and have made adjustments as needed  Social  Determinants of Health:  Lives at home with family     Final diagnoses:  Generalized weakness  Delirium  Urinary tract infection with hematuria, site unspecified    ED Discharge Orders     None          Melvenia Motto, MD 12/12/23 1517

## 2023-12-12 NOTE — Assessment & Plan Note (Addendum)
 Baseline moderate dementia.  Presenting this time with generalized weakness, intermittent confusion, poor oral intake.  On my evaluation, mental status appears to be at baseline.  Concern for UTI, with UA showing small leukocytes few bacteria and yeast.  Rules out for sepsis.  Recent hospitalization for same, also concern for subclinical seizures.  Urine cultures grew E. coli, sensitive to ceftriaxone . - Continue IV ceftriaxone  -Follow-up urine cultures - Resume Lexapro , Trileptal  - Continue Diflucan  100 mg daily - 1 L bolus given, continue N/s + 20 KCL 75cc/hr x 12 hrs -Resume further medications pending med rec

## 2023-12-13 DIAGNOSIS — E66811 Obesity, class 1: Secondary | ICD-10-CM | POA: Diagnosis present

## 2023-12-13 DIAGNOSIS — F32A Depression, unspecified: Secondary | ICD-10-CM | POA: Diagnosis present

## 2023-12-13 DIAGNOSIS — N3001 Acute cystitis with hematuria: Secondary | ICD-10-CM | POA: Diagnosis not present

## 2023-12-13 DIAGNOSIS — E876 Hypokalemia: Secondary | ICD-10-CM | POA: Diagnosis present

## 2023-12-13 DIAGNOSIS — Z801 Family history of malignant neoplasm of trachea, bronchus and lung: Secondary | ICD-10-CM | POA: Diagnosis not present

## 2023-12-13 DIAGNOSIS — G309 Alzheimer's disease, unspecified: Secondary | ICD-10-CM | POA: Diagnosis present

## 2023-12-13 DIAGNOSIS — Z833 Family history of diabetes mellitus: Secondary | ICD-10-CM | POA: Diagnosis not present

## 2023-12-13 DIAGNOSIS — F0390 Unspecified dementia without behavioral disturbance: Secondary | ICD-10-CM | POA: Diagnosis not present

## 2023-12-13 DIAGNOSIS — Z8249 Family history of ischemic heart disease and other diseases of the circulatory system: Secondary | ICD-10-CM | POA: Diagnosis not present

## 2023-12-13 DIAGNOSIS — N390 Urinary tract infection, site not specified: Secondary | ICD-10-CM | POA: Diagnosis present

## 2023-12-13 DIAGNOSIS — Z803 Family history of malignant neoplasm of breast: Secondary | ICD-10-CM | POA: Diagnosis not present

## 2023-12-13 DIAGNOSIS — R319 Hematuria, unspecified: Secondary | ICD-10-CM | POA: Diagnosis present

## 2023-12-13 DIAGNOSIS — F02B0 Dementia in other diseases classified elsewhere, moderate, without behavioral disturbance, psychotic disturbance, mood disturbance, and anxiety: Secondary | ICD-10-CM | POA: Diagnosis present

## 2023-12-13 DIAGNOSIS — Z800 Family history of malignant neoplasm of digestive organs: Secondary | ICD-10-CM | POA: Diagnosis not present

## 2023-12-13 DIAGNOSIS — Z7901 Long term (current) use of anticoagulants: Secondary | ICD-10-CM | POA: Diagnosis not present

## 2023-12-13 DIAGNOSIS — I10 Essential (primary) hypertension: Secondary | ICD-10-CM | POA: Diagnosis present

## 2023-12-13 DIAGNOSIS — B952 Enterococcus as the cause of diseases classified elsewhere: Secondary | ICD-10-CM | POA: Diagnosis present

## 2023-12-13 DIAGNOSIS — Z6831 Body mass index (BMI) 31.0-31.9, adult: Secondary | ICD-10-CM | POA: Diagnosis not present

## 2023-12-13 DIAGNOSIS — K219 Gastro-esophageal reflux disease without esophagitis: Secondary | ICD-10-CM | POA: Diagnosis present

## 2023-12-13 DIAGNOSIS — I480 Paroxysmal atrial fibrillation: Secondary | ICD-10-CM | POA: Diagnosis present

## 2023-12-13 DIAGNOSIS — R531 Weakness: Secondary | ICD-10-CM | POA: Diagnosis present

## 2023-12-13 DIAGNOSIS — E7800 Pure hypercholesterolemia, unspecified: Secondary | ICD-10-CM | POA: Diagnosis present

## 2023-12-13 DIAGNOSIS — Z1621 Resistance to vancomycin: Secondary | ICD-10-CM | POA: Diagnosis present

## 2023-12-13 DIAGNOSIS — G9341 Metabolic encephalopathy: Secondary | ICD-10-CM | POA: Diagnosis present

## 2023-12-13 DIAGNOSIS — F419 Anxiety disorder, unspecified: Secondary | ICD-10-CM | POA: Diagnosis present

## 2023-12-13 DIAGNOSIS — R569 Unspecified convulsions: Secondary | ICD-10-CM | POA: Diagnosis present

## 2023-12-13 DIAGNOSIS — Z751 Person awaiting admission to adequate facility elsewhere: Secondary | ICD-10-CM | POA: Diagnosis not present

## 2023-12-13 LAB — CBC
HCT: 47.6 % — ABNORMAL HIGH (ref 36.0–46.0)
Hemoglobin: 15.1 g/dL — ABNORMAL HIGH (ref 12.0–15.0)
MCH: 30.4 pg (ref 26.0–34.0)
MCHC: 31.7 g/dL (ref 30.0–36.0)
MCV: 95.8 fL (ref 80.0–100.0)
Platelets: 215 10*3/uL (ref 150–400)
RBC: 4.97 MIL/uL (ref 3.87–5.11)
RDW: 14.3 % (ref 11.5–15.5)
WBC: 6.7 10*3/uL (ref 4.0–10.5)
nRBC: 0 % (ref 0.0–0.2)

## 2023-12-13 LAB — BASIC METABOLIC PANEL WITH GFR
Anion gap: 8 (ref 5–15)
BUN: 6 mg/dL — ABNORMAL LOW (ref 8–23)
CO2: 27 mmol/L (ref 22–32)
Calcium: 9 mg/dL (ref 8.9–10.3)
Chloride: 103 mmol/L (ref 98–111)
Creatinine, Ser: 0.61 mg/dL (ref 0.44–1.00)
GFR, Estimated: >60 mL/min (ref >60)
Glucose, Bld: 71 mg/dL (ref 70–99)
Potassium: 4.3 mmol/L (ref 3.5–5.1)
Sodium: 138 mmol/L (ref 135–145)

## 2023-12-13 LAB — FOLATE: Folate: 4.9 ng/mL — ABNORMAL LOW (ref >5.9)

## 2023-12-13 LAB — TSH: TSH: 1.61 u[IU]/mL (ref 0.350–4.500)

## 2023-12-13 LAB — VITAMIN B12: Vitamin B-12: 276 pg/mL (ref 180–914)

## 2023-12-13 LAB — T4, FREE: Free T4: 0.89 ng/dL (ref 0.61–1.12)

## 2023-12-13 MED ORDER — FOLIC ACID 1 MG PO TABS
1.0000 mg | ORAL_TABLET | Freq: Every day | ORAL | Status: DC
  Administered 2023-12-13 – 2023-12-16 (×4): 1 mg via ORAL
  Filled 2023-12-13 (×4): qty 1

## 2023-12-13 MED ORDER — CYANOCOBALAMIN 1000 MCG/ML IJ SOLN: 1000.0000 ug | Freq: Once | INTRAMUSCULAR | Status: DC

## 2023-12-13 MED ORDER — VITAMIN B-12 100 MCG PO TABS
500.0000 ug | ORAL_TABLET | Freq: Every day | ORAL | Status: DC
  Administered 2023-12-13 – 2023-12-16 (×4): 500 ug via ORAL
  Filled 2023-12-13 (×4): qty 5

## 2023-12-13 NOTE — Progress Notes (Signed)
 Mobility Specialist Progress Note:    12/13/23 1123  Mobility  Activity Stood at bedside  Level of Assistance Moderate assist, patient does 50-74%  Assistive Device Front wheel walker  Range of Motion/Exercises Active;All extremities  Activity Response Tolerated well  Mobility Referral Yes  Mobility visit 1 Mobility  Mobility Specialist Start Time (ACUTE ONLY) 1112  Mobility Specialist Stop Time (ACUTE ONLY) 1123  Mobility Specialist Time Calculation (min) (ACUTE ONLY) 11 min   NT requested assistance standing pt up. Required ModA to stand with RW. Tolerated well, asx throughout. Returned sitting, alarm on. NT in room, all needs met.  Sherrilee Ditty Mobility Specialist Please contact via Special educational needs teacher or  Rehab office at [phone]

## 2023-12-13 NOTE — TOC Initial Note (Signed)
 Transition of Care [facility]) - Initial/Assessment Note    Patient Details  Name: Donna Barron MRN: [id number] Date of Birth: 1940-09-02  [facility] CM/SW Contact:    Sharlyne Stabs, RN Phone Number: 12/13/2023, 1:34 PM  Clinical Narrative:   Patient admitted with acute metabolic encephalopathy. PT is recommending SNF. TOC left message with daughter, Patient was at [facility] in May. Per MD daughter is open to whatever her mother needs. FL2 completed.  Patient is active with [facility]. Waiting for daughter to call back.                 Expected Discharge Plan: Skilled Nursing Facility Barriers to Discharge: Continued Medical Work up, SNF Pending bed offer   Patient Goals and CMS Choice Patient states their goals for this hospitalization and ongoing recovery are:: to get stronger CMS Medicare.gov Compare Post Acute Care list provided to:: Patient Choice offered to / list presented to : Patient      Expected Discharge Plan and Services       Living arrangements for the past 2 months: Single Family Home                       Prior Living Arrangements/Services Living arrangements for the past 2 months: Single Family Home Lives with:: Adult Children Patient language and need for interpreter reviewed:: Yes        Need for Family Participation in Patient Care: Yes (Comment) Care giver support system in place?: Yes (comment) Current home services: DME Criminal Activity/Legal Involvement Pertinent to Current Situation/Hospitalization: No - Comment as needed  Activities of Daily Living   ADL Screening (condition at time of admission) Independently performs ADLs?: Yes (appropriate for developmental age) Is the patient deaf or have difficulty hearing?: No Does the patient have difficulty seeing, even when wearing glasses/contacts?: No Does the patient have difficulty concentrating, remembering, or making decisions?: No  Permission Sought/Granted             Permission granted to share info w Relationship: Daughter     Emotional Assessment           Psych Involvement: No (comment)  Admission diagnosis:  Delirium [R41.0] Generalized weakness [R53.1] Urinary tract infection with hematuria, site unspecified [N39.0, R31.9] Acute metabolic encephalopathy [G93.41] Patient Active Problem List   Diagnosis Date Noted   UTI (urinary tract infection) 12/12/2023   Bilateral lower extremity edema 11/15/2023   Paroxysmal atrial fibrillation (HCC) 11/02/2023   Acute metabolic encephalopathy 10/13/2023   Memory loss 09/24/2021   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, without behavioral disturbance (HCC) 09/24/2021   Status post Nissen fundoplication Oct 2021 03/24/2020   Status post laparoscopic Nissen fundoplication 03/24/2020   Overactive bladder 12/04/2019   Dysphagia 09/11/2019   S/P left rotator cuff repair 06/15/18 06/15/2018   Nontraumatic complete tear of left rotator cuff    Class 2 severe obesity with serious comorbidity and body mass index (BMI) of 35.0 to 35.9 in adult (HCC) 05/23/2018   S/P carpal tunnel and DeQuervains release right 07/14/17 07/20/2017   Carpal tunnel syndrome of right wrist    De Quervain's disease (tenosynovitis)    Essential hypertension 11/15/2013   Palpitations 06/28/2013   Mixed hyperlipidemia 06/28/2013   Obesity, unspecified 06/28/2013   PCP:  [name], NP Pharmacy:   [facility] [address], [place] - [address] [place] [address] Phone: [phone] Fax: [phone]  Social Drivers of Health (SDOH) Social History: SDOH Screenings   Food Insecurity: No Food Insecurity (12/12/2023)  Housing: Low Risk  (12/12/2023)  Transportation Needs: No Transportation Needs (12/12/2023)  Utilities: Not At Risk (12/12/2023)  Financial Resource Strain: Low Risk  (08/11/2023)   Received from [facility]  Physical Activity: Unknown  (01/13/2023)   Received from [facility]  Social Connections: Moderately Isolated (12/12/2023)  Stress: No Stress Concern Present (01/13/2023)   Received from [facility]  Tobacco Use: Low Risk  (12/12/2023)   SDOH Interventions:     Readmission Risk Interventions    10/13/2023    9:11 PM  Readmission Risk Prevention Plan  Post Dischage Appt Complete  Medication Screening Complete  Transportation Screening Complete

## 2023-12-13 NOTE — Plan of Care (Signed)
  Problem: Acute Rehab PT Goals(only PT should resolve) Goal: Pt Will Go Supine/Side To Sit Outcome: Progressing Flowsheets (Taken 12/13/2023 1146) Pt will go Supine/Side to Sit: with minimal assist Goal: Patient Will Transfer Sit To/From Stand Outcome: Progressing Flowsheets (Taken 12/13/2023 1146) Patient will transfer sit to/from stand: with minimal assist Goal: Pt Will Transfer Bed To Chair/Chair To Bed Outcome: Progressing Flowsheets (Taken 12/13/2023 1146) Pt will Transfer Bed to Chair/Chair to Bed: with min assist Goal: Pt Will Ambulate Outcome: Progressing Flowsheets (Taken 12/13/2023 1146) Pt will Ambulate:  10 feet  with minimal assist  with rolling walker   11:46 AM, 12/13/23 Rosaria Settler, PT, DPT [place] with [facility]

## 2023-12-13 NOTE — NC FL2 (Signed)
 Donna Barron  MEDICAID FL2 LEVEL OF CARE FORM     IDENTIFICATION  Patient Name: Donna Barron Birthdate: 20-Oct-1940 Sex: female Admission Date (Current Location): 12/12/2023  [facility] and [place] Number:  Donna Barron and Address:  Donna Barron,  [address] Donna Barron [address]      Provider Number: [id number]  Attending Physician Name and Address:  Evonnie Lenis, MD  Relative Name and Phone Number:  Donna Barron, Donna Barron (Daughter)  [phone]    Current Level of Care: SNF Recommended Level of Care:   Prior Approval Number:    Date Approved/Denied:   PASRR Number: [id number]  Discharge Plan: SNF    Current Diagnoses: Patient Active Problem List   Diagnosis Date Noted   UTI (urinary tract infection) 12/12/2023   Bilateral lower extremity edema 11/15/2023   Paroxysmal atrial fibrillation (HCC) 11/02/2023   Acute metabolic encephalopathy 10/13/2023   Memory loss 09/24/2021   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, without behavioral disturbance (HCC) 09/24/2021   Status post Nissen fundoplication Oct 2021 03/24/2020   Status post laparoscopic Nissen fundoplication 03/24/2020   Overactive bladder 12/04/2019   Dysphagia 09/11/2019   S/P left rotator cuff repair 06/15/18 06/15/2018   Nontraumatic complete tear of left rotator cuff    Class 2 severe obesity with serious comorbidity and body mass index (BMI) of 35.0 to 35.9 in adult (HCC) 05/23/2018   S/P carpal tunnel and DeQuervains release right 07/14/17 07/20/2017   Carpal tunnel syndrome of right wrist    De Quervain's disease (tenosynovitis)    Essential hypertension 11/15/2013   Palpitations 06/28/2013   Mixed hyperlipidemia 06/28/2013   Obesity, unspecified 06/28/2013    Orientation RESPIRATION BLADDER Height & Weight     Self  Normal Incontinent Weight: 78 kg Height:  5' 2 (157.5 cm)  BEHAVIORAL SYMPTOMS/MOOD NEUROLOGICAL BOWEL NUTRITION STATUS      Incontinent  Diet (See DC Summary)  AMBULATORY STATUS COMMUNICATION OF NEEDS Skin   Extensive Assist Verbally Normal                       Personal Care Assistance Level of Assistance  Bathing, Feeding, Dressing Bathing Assistance: Maximum assistance Feeding assistance: Limited assistance Dressing Assistance: Maximum assistance     Functional Limitations Info  Sight, Speech, Hearing Sight Info: Adequate Hearing Info: Adequate Speech Info: Adequate    SPECIAL CARE FACTORS FREQUENCY  PT (By licensed PT)     PT Frequency: 5 times a week              Contractures Contractures Info: Not present    Additional Factors Info  Code Status, Allergies   Allergies Info: codeine , hydorcodone, avelox, oxybutynin           Current Medications (12/13/2023):  This is the current hospital active medication list Current Facility-Administered Medications  Medication Dose Route Frequency Provider Last Rate Last Admin   acetaminophen  (TYLENOL ) tablet 650 mg  650 mg Oral Q6H PRN Emokpae, Ejiroghene E, MD       Or   acetaminophen  (TYLENOL ) suppository 650 mg  650 mg Rectal Q6H PRN Emokpae, Ejiroghene E, MD       apixaban  (ELIQUIS ) tablet 5 mg  5 mg Oral BID Emokpae, Ejiroghene E, MD   5 mg at 12/13/23 0846   cefTRIAXone  (ROCEPHIN ) 1 g in sodium chloride  0.9 % 100 mL IVPB  1 g Intravenous Q24H Emokpae, Ejiroghene E, MD       divalproex  (DEPAKOTE  ER)  24 hr tablet 500 mg  500 mg Oral QHS Emokpae, Ejiroghene E, MD   500 mg at 12/12/23 2300   escitalopram  (LEXAPRO ) tablet 20 mg  20 mg Oral QHS Emokpae, Ejiroghene E, MD   20 mg at 12/12/23 2300   feeding supplement (ENSURE PLUS HIGH PROTEIN) liquid 237 mL  237 mL Oral BID BM Emokpae, Ejiroghene E, MD   237 mL at 12/13/23 0845   folic acid (FOLVITE) tablet 1 mg  1 mg Oral Daily Tat, David, MD       metoprolol  succinate (TOPROL -XL) 24 hr tablet 75 mg  75 mg Oral Daily Emokpae, Ejiroghene E, MD   75 mg at 12/13/23 0846   ondansetron  (ZOFRAN ) tablet 4 mg  4  mg Oral Q6H PRN Emokpae, Ejiroghene E, MD       Or   ondansetron  (ZOFRAN ) injection 4 mg  4 mg Intravenous Q6H PRN Emokpae, Ejiroghene E, MD       OXcarbazepine  (TRILEPTAL ) tablet 150 mg  150 mg Oral BID Emokpae, Ejiroghene E, MD   150 mg at 12/13/23 0846   pantoprazole  (PROTONIX ) EC tablet 40 mg  40 mg Oral Daily Emokpae, Ejiroghene E, MD   40 mg at 12/13/23 0846   polyethylene glycol (MIRALAX  / GLYCOLAX ) packet 17 g  17 g Oral Daily PRN Emokpae, Ejiroghene E, MD       vitamin B-12 (CYANOCOBALAMIN ) tablet 500 mcg  500 mcg Oral Daily Tat, David, MD       Facility-Administered Medications Ordered in Other Encounters  Medication Dose Route Frequency Provider Last Rate Last Admin   fentaNYL  (SUBLIMAZE ) injection 25-50 mcg  25-50 mcg Intravenous Q5 min PRN Jerrye Sharper, MD         Discharge Medications: Please see discharge summary for a list of discharge medications.  Relevant Imaging Results:  Relevant Lab Results:   Additional Information SS# [id number]  Sharlyne Stabs, RN

## 2023-12-13 NOTE — Hospital Course (Signed)
 83 y.o. female with medical history significant for hypertension, Alzheimer's dementia, paroxysmal atrial fibrillation presents with altered mental status and generalized weakness.  The patient was recently mated to the hospital from 10/13/2023 to 10/17/2023 for acute metabolic encephalopathy secondary to UTI and possible subclinical seizure.  She was discharged to the [facility].  She has been back at home living with her daughter for the past 3 weeks. Daughter states that the patient has had increasing confusion and generalized weakness over the past 5 to 6 days.  She has had decreased oral intake.  There is been no fevers, chills, chest pain, shortness breath, vomiting, diarrhea. The patient was admitted for further workup of her acute metabolic encephalopathy.

## 2023-12-13 NOTE — Plan of Care (Signed)
   Problem: Clinical Measurements: Goal: Respiratory complications will improve Outcome: Progressing Goal: Cardiovascular complication will be avoided Outcome: Progressing   Problem: Activity: Goal: Risk for activity intolerance will decrease Outcome: Progressing   Problem: Nutrition: Goal: Adequate nutrition will be maintained Outcome: Progressing

## 2023-12-13 NOTE — Plan of Care (Signed)
   Problem: Clinical Measurements: Goal: Will remain free from infection Outcome: Progressing   Problem: Clinical Measurements: Goal: Respiratory complications will improve Outcome: Progressing   Problem: Clinical Measurements: Goal: Cardiovascular complication will be avoided Outcome: Progressing

## 2023-12-13 NOTE — Plan of Care (Signed)

## 2023-12-13 NOTE — Evaluation (Signed)
 Physical Therapy Evaluation Patient Details Name: Donna Barron MRN: [id number] DOB: Mar 20, 1941 Today's Date: 12/13/2023  History of Present Illness  Donna Barron is a 83 y.o. female with medical history significant for hypertension, Alzheimer's dementia, paroxysmal atrial fibrillation.  Patient was brought to the ED by daughter with reports of intermittent episodes of confusion, generalized weakness, lower abdominal pain and complaints of dysuria over the past 5 to 6 days.     Patient was admitted 5/1 to 5/5 for encephalopathy secondary to urinary tract infection versus subclinical seizures.  Treated with IV ceftriaxone  and was discharged to [facility].      Patient was discharged back home 3 weeks ago and was doing very well, able to ambulate, even bathe herself standing.  Over the past 5 to 6 days, patient has not been able to ambulate, has been progressively weak.  Symptoms this time and not as severe as prior.  She has also had poor oral intake.  No vomiting or diarrhea.   Clinical Impression  Patient agreeable to PT evaluation. Patient received sleeping supine in bed. Difficult to arouse initially but improves. Patient oriented to self only. Required mod/max A for all bed mobility, functional transfers, and short ambulation with RW. Patient is a poor historian and no family was present to confirm home support/availability of assistance. At this time, recommendation is for SNF at this time, with possibility of progressing with improvements in underlying cause and confirmation of assistance at home. Patient tolerated sitting in recliner at end of session, assist for set up of breakfast, call button within reach, and chair alarm set.  Patient will benefit from continued skilled physical therapy acutely in order to address the above.        If plan is discharge home, recommend the following: A lot of help with walking and/or transfers;A little help with bathing/dressing/bathroom;Assistance with  cooking/housework;Assist for transportation   Can travel by private vehicle   Yes    Equipment Recommendations None recommended by PT  Recommendations for Other Services       Functional Status Assessment Patient has had a recent decline in their functional status and demonstrates the ability to make significant improvements in function in a reasonable and predictable amount of time.     Precautions / Restrictions Precautions Precautions: Fall Recall of Precautions/Restrictions: Impaired Restrictions Weight Bearing Restrictions Per Provider Order: No      Mobility  Bed Mobility Overal bed mobility: Needs Assistance Bed Mobility: Rolling, Supine to Sit, Sidelying to Sit Rolling: Mod assist Sidelying to sit: Mod assist Supine to sit: Mod assist     General bed mobility comments: Slow labored movement, followed verbal commands to walk LE to EOB, mod A for trunk elevation and LE handling    Transfers Overall transfer level: Needs assistance Equipment used: Rolling walker (2 wheels) Transfers: Sit to/from Stand, Bed to chair/wheelchair/BSC Sit to Stand: Mod assist   Step pivot transfers: Mod assist       General transfer comment: Slow labored movement. Mod A and verbal cues for hand placement for STS from bed due to general weakness. Consistent verbal cueing and assist for RW maangement required during bed>chair, with pt taking very small steps. Posterior lean throughout    Ambulation/Gait Ambulation/Gait assistance: Mod assist Gait Distance (Feet): 3 Feet Assistive device: Rolling walker (2 wheels) Gait Pattern/deviations: Step-to pattern, Decreased step length - right, Decreased step length - left, Leaning posteriorly Gait velocity: Dec     General Gait Details: Slow labored movement,  posterior lean with inc fatigue. Small shuffled steps at bedside.  Stairs            Wheelchair Mobility     Tilt Bed    Modified Rankin (Stroke Patients Only)        Balance Overall balance assessment: Needs assistance Sitting-balance support: Feet supported, No upper extremity supported, Bilateral upper extremity supported Sitting balance-Leahy Scale: Fair Sitting balance - Comments: Seated EOB   Standing balance support: During functional activity, Reliant on assistive device for balance, Bilateral upper extremity supported Standing balance-Leahy Scale: Fair Standing balance comment: w/ RW and mod A           Pertinent Vitals/Pain Pain Assessment Pain Assessment: Faces Pain Score: 0-No pain    Home Living Family/patient expects to be discharged to:: Private residence Living Arrangements: Children Available Help at Discharge: Family;Available PRN/intermittently Type of Home: House Home Access: Stairs to enter Entrance Stairs-Rails: Can reach both;Left;Right Entrance Stairs-Number of Steps: 10   Home Layout: Two level;Able to live on main level with bedroom/bathroom Home Equipment: BSC/3in1;Wheelchair - manual Additional Comments: Pt poor historian. No family present at time of PT eval. Infomation taken from previous admission and most recent MD note.    Prior Function Prior Level of Function : Independent/Modified Independent             Mobility Comments: Pt reports independent ambulation without AD during last admission. Per H&P since returning home, patient was able to ambulate ADLs Comments: Pt reports independence during last admission. Per H&P since returning home, patient was able to bathe herself standing     Extremity/Trunk Assessment   Upper Extremity Assessment Upper Extremity Assessment: Generalized weakness    Lower Extremity Assessment Lower Extremity Assessment: Generalized weakness    Cervical / Trunk Assessment Cervical / Trunk Assessment: Kyphotic  Communication   Communication Communication: No apparent difficulties    Cognition Arousal: Alert Behavior During Therapy: WFL for tasks  assessed/performed   PT - Cognitive impairments: History of cognitive impairments       PT - Cognition Comments: Oriented to self. Disoriented to place, time, and situation Following commands: Intact       Cueing Cueing Techniques: Verbal cues, Tactile cues     General Comments      Exercises     Assessment/Plan    PT Assessment Patient needs continued PT services;All further PT needs can be met in the next venue of care  PT Problem List Decreased strength;Decreased activity tolerance;Decreased balance;Decreased mobility;Decreased cognition;Decreased safety awareness       PT Treatment Interventions DME instruction;Gait training;Stair training;Functional mobility training;Therapeutic activities;Therapeutic exercise;Balance training;Patient/family education    PT Goals (Current goals can be found in the Care Plan section)  Acute Rehab PT Goals Patient Stated Goal: Return home PT Goal Formulation: With patient Time For Goal Achievement: 12/20/23 Potential to Achieve Goals: Fair    Frequency Min 3X/week     Co-evaluation               AM-PAC PT 6 Clicks Mobility  Outcome Measure Help needed turning from your back to your side while in a flat bed without using bedrails?: A Lot Help needed moving from lying on your back to sitting on the side of a flat bed without using bedrails?: A Lot Help needed moving to and from a bed to a chair (including a wheelchair)?: A Lot Help needed standing up from a chair using your arms (e.g., wheelchair or bedside chair)?: A Lot Help needed to  walk in hospital room?: A Lot Help needed climbing 3-5 steps with a railing? : Total 6 Click Score: 11    End of Session Equipment Utilized During Treatment: Gait belt Activity Tolerance: Patient tolerated treatment well Patient left: in chair;with call bell/phone within reach;with chair alarm set;with nursing/sitter in room Nurse Communication: Mobility status PT Visit Diagnosis:  Unsteadiness on feet (R26.81);Other abnormalities of gait and mobility (R26.89);Muscle weakness (generalized) (M62.81);Difficulty in walking, not elsewhere classified (R26.2)    Time: 9168-9145 PT Time Calculation (min) (ACUTE ONLY): 23 min   Charges:   PT Evaluation $PT Eval Moderate Complexity: 1 Mod PT Treatments $Therapeutic Activity: 23-37 mins PT General Charges $$ ACUTE PT VISIT: 1 Visit         11:44 AM, 12/13/23 Rosaria Settler, PT, DPT [place] with [facility]

## 2023-12-13 NOTE — Progress Notes (Signed)
 PROGRESS NOTE  Donna Barron FMW:[id number] DOB: [birth year] DOA: 12/12/2023 PCP: [name], NP  Brief History:  83 y.o. female with medical history significant for hypertension, Alzheimer's dementia, paroxysmal atrial fibrillation presents with altered mental status and generalized weakness.  The patient was recently mated to the hospital from 10/13/2023 to 10/17/2023 for acute metabolic encephalopathy secondary to UTI and possible subclinical seizure.  She was discharged to the [facility].  She has been back at home living with her daughter for the past 3 weeks. Daughter states that the patient has had increasing confusion and generalized weakness over the past 5 to 6 days.  She has had decreased oral intake.  There is been no fevers, chills, chest pain, shortness breath, vomiting, diarrhea. The patient was admitted for further workup of her acute metabolic encephalopathy.   Assessment/Plan: Acute metabolic encephalopathy - Multifactorial with UTI being the primary driver -UA > 50 WBC - Remains somewhat confused - B12--276>> from a neurologic standpoint, would like to see this above 400 - Folic acid 4.9>> replete  UTI - Continue ceftriaxone  pending culture data  Paroxysmal atrial fibrillation - Continue metoprolol  - Continue apixaban   Essential hypertension - Continue metoprolol   Major neurocognitive disorder - Continue Depakote  -continue lexapro   History of seizures - Continue Depakote  and Trileptal   Class 1 obesity -BMI 31.46         Family Communication:   daughter 7/1 Code Status:  FULL /  DVT Prophylaxis:  apixaban    Procedures: As Listed in Progress Note Above  Antibiotics: Ceftriaxone  6/30>>      Subjective: Patient denies fevers, chills, headache, chest pain, dyspnea, nausea, vomiting, diarrhea, abdominal pain, dysuria, hematuria, hematochezia, and melena.   Objective: Vitals:   12/12/23 1622 12/12/23 2016 12/13/23 0017  12/13/23 0445  BP: 117/64 132/64 108/64 (!) 113/52  Pulse: 61 68 66 (!) 58  Resp: 18 16 18 18   Temp: 98 F (36.7 C) 97.7 F (36.5 C) 98.3 F (36.8 C) 97.6 F (36.4 C)  TempSrc: Oral Oral Oral Oral  SpO2: 98% 99% 98% 95%  Weight:      Height:        Intake/Output Summary (Last 24 hours) at 12/13/2023 1110 Last data filed at 12/13/2023 0900 Gross per 24 hour  Intake 150 ml  Output --  Net 150 ml   Weight change:  Exam:  General:  Pt is alert, follows commands appropriately, not in acute distress HEENT: No icterus, No thrush, No neck mass, [place]/AT Cardiovascular: RRR, S1/S2, no rubs, no gallops Respiratory: CTA bilaterally, no wheezing, no crackles, no rhonchi Abdomen: Soft/+BS, non tender, non distended, no guarding Extremities: No edema, No lymphangitis, No petechiae, No rashes, no synovitis Neuro:  CN II-XII intact, strength 4/5 in RUE, RLE, strength 4/5 LUE, LLE; sensation intact bilateral; no dysmetria; babinski equivocal    Data Reviewed: I have personally reviewed following labs and imaging studies Basic Metabolic Panel: Recent Labs  Lab 12/12/23 1127 12/13/23 0403  NA 138 138  K 3.4* 4.3  CL 96* 103  CO2 30 27  GLUCOSE 96 71  BUN 11 6*  CREATININE 0.72 0.61  CALCIUM 9.5 9.0  MG 1.9  --    Liver Function Tests: Recent Labs  Lab 12/12/23 1127  AST 24  ALT 11  ALKPHOS 76  BILITOT 0.8  PROT 6.8  ALBUMIN 3.2*   No results for input(s): LIPASE, AMYLASE in the last 168 hours. No results for  input(s): AMMONIA in the last 168 hours. Coagulation Profile: No results for input(s): INR, PROTIME in the last 168 hours. CBC: Recent Labs  Lab 12/12/23 1127 12/13/23 0403  WBC 7.9 6.7  NEUTROABS 5.1  --   HGB 17.1* 15.1*  HCT 52.0* 47.6*  MCV 94.9 95.8  PLT 240 215   Cardiac Enzymes: No results for input(s): CKTOTAL, CKMB, CKMBINDEX, TROPONINI in the last 168 hours. BNP: Invalid input(s): POCBNP CBG: No results for input(s): GLUCAP  in the last 168 hours. HbA1C: No results for input(s): HGBA1C in the last 72 hours. Urine analysis:    Component Value Date/Time   COLORURINE YELLOW 12/12/2023 1154   APPEARANCEUR TURBID (A) 12/12/2023 1154   LABSPEC 1.026 12/12/2023 1154   PHURINE 5.0 12/12/2023 1154   GLUCOSEU NEGATIVE 12/12/2023 1154   HGBUR MODERATE (A) 12/12/2023 1154   BILIRUBINUR NEGATIVE 12/12/2023 1154   KETONESUR 5 (A) 12/12/2023 1154   PROTEINUR 100 (A) 12/12/2023 1154   NITRITE NEGATIVE 12/12/2023 1154   LEUKOCYTESUR SMALL (A) 12/12/2023 1154   Sepsis Labs: @LABRCNTIP (procalcitonin:4,lacticidven:4) )No results found for this or any previous visit (from the past 240 hours).   Scheduled Meds:  apixaban   5 mg Oral BID   divalproex   500 mg Oral QHS   escitalopram   20 mg Oral QHS   feeding supplement  237 mL Oral BID BM   fluconazole   100 mg Oral Daily   metoprolol  succinate  75 mg Oral Daily   OXcarbazepine   150 mg Oral BID   pantoprazole   40 mg Oral Daily   Continuous Infusions:  cefTRIAXone  (ROCEPHIN )  IV      Procedures/Studies: CT ABDOMEN PELVIS W CONTRAST Result Date: 12/12/2023 CLINICAL DATA:  Abdominal pain possible UTI EXAM: CT ABDOMEN AND PELVIS WITH CONTRAST TECHNIQUE: Multidetector CT imaging of the abdomen and pelvis was performed using the standard protocol following bolus administration of intravenous contrast. RADIATION DOSE REDUCTION: This exam was performed according to the departmental dose-optimization program which includes automated exposure control, adjustment of the mA and/or kV according to patient size and/or use of iterative reconstruction technique. CONTRAST:  OMNIPAQUE  IOHEXOL  300 MG/ML  SOLN COMPARISON:  CT 07/18/2020 FINDINGS: Lower chest: Lung bases demonstrate no acute airspace disease. Dense mitral calcification. Coronary vascular calcification. Small hiatal hernia Hepatobiliary: No focal liver abnormality is seen. Status post cholecystectomy. No biliary  dilatation. Pancreas: Unremarkable. No pancreatic ductal dilatation or surrounding inflammatory changes. Spleen: Normal in size without focal abnormality. Adrenals/Urinary Tract: Adrenal glands are unremarkable. Kidneys are normal, without renal calculi, focal lesion, or hydronephrosis. Bladder is unremarkable. Stomach/Bowel: Status post fundoplication. No dilated small bowel. No acute bowel wall thickening Vascular/Lymphatic: Aortic atherosclerosis. No enlarged abdominal or pelvic lymph nodes. Retroaortic left renal vein. Reproductive: Uterus and bilateral adnexa are unremarkable. 1 cm right adnexal cyst for which no imaging follow-up is recommended. Other: No abdominal wall hernia or abnormality. No abdominopelvic ascites. Musculoskeletal: No acute or suspicious osseous abnormality. Grade 1 anterolisthesis L3 on L4. Multilevel degenerative changes IMPRESSION: 1. No CT evidence for acute intra-abdominal or pelvic abnormality. 2. Status post fundoplication with small hiatal hernia. 3. Aortic atherosclerosis. Aortic Atherosclerosis (ICD10-I70.0). Electronically Signed   By: Luke Bun M.D.   On: 12/12/2023 15:06   DG Chest Port 1 View Result Date: 12/12/2023 CLINICAL DATA:  Sepsis. EXAM: PORTABLE CHEST 1 VIEW COMPARISON:  Chest radiograph dated 10/22/2022. FINDINGS: No focal consolidation, pleural effusion, or pneumothorax. The cardiac silhouette is within normal limits. No acute osseous pathology. IMPRESSION: No active disease.  Electronically Signed   By: Vanetta Chou M.D.   On: 12/12/2023 10:55    Alm Schneider, DO  [facility]  If 7PM-7AM, please contact night-coverage [email or web address] Password [email or web address] 12/13/2023, 11:10 AM   LOS: 0 days

## 2023-12-14 DIAGNOSIS — G9341 Metabolic encephalopathy: Secondary | ICD-10-CM | POA: Diagnosis not present

## 2023-12-14 LAB — URINE CULTURE: Culture: 80000 — AB

## 2023-12-14 NOTE — TOC Progression Note (Signed)
 Transition of Care [facility]) - Progression Note    Patient Details  Name: Donna Barron MRN: [id number] Date of Birth: April 06, 1941  [facility]) CM/SW Contact  Sharlyne Stabs, RN Phone Number: 12/14/2023, 11:59 AM  Clinical Narrative:   Discussed bed offers with [name], daughter. She was hopeful she could discharge home, but due to weakness and only walking 3 feets, she is agreeable to SNF. She active with [facility].  TOC following DC 1-2 days   Expected Discharge Plan: Skilled Nursing Facility Barriers to Discharge: Continued Medical Work up, SNF Pending bed offer  Expected Discharge Plan and Services     Living arrangements for the past 2 months: Single Family Home                Social Determinants of Health (SDOH) Interventions SDOH Screenings   Food Insecurity: No Food Insecurity (12/12/2023)  Housing: Low Risk  (12/12/2023)  Transportation Needs: No Transportation Needs (12/12/2023)  Utilities: Not At Risk (12/12/2023)  Financial Resource Strain: Low Risk  (08/11/2023)   Received from [facility]  Physical Activity: Unknown (01/13/2023)   Received from [facility]  Social Connections: Moderately Isolated (12/12/2023)  Stress: No Stress Concern Present (01/13/2023)   Received from [facility]  Tobacco Use: Low Risk  (12/12/2023)    Readmission Risk Interventions    10/13/2023    9:11 PM  Readmission Risk Prevention Plan  Post Dischage Appt Complete  Medication Screening Complete  Transportation Screening Complete

## 2023-12-14 NOTE — Plan of Care (Signed)
  Problem: Clinical Measurements: Goal: Respiratory complications will improve Outcome: Progressing Goal: Cardiovascular complication will be avoided Outcome: Progressing   Problem: Activity: Goal: Risk for activity intolerance will decrease Outcome: Progressing   Problem: Nutrition: Goal: Adequate nutrition will be maintained Outcome: Progressing   Problem: Coping: Goal: Level of anxiety will decrease Outcome: Progressing   Problem: Elimination: Goal: Will not experience complications related to urinary retention Outcome: Progressing   Problem: Pain Managment: Goal: General experience of comfort will improve and/or be controlled Outcome: Progressing   Problem: Safety: Goal: Ability to remain free from injury will improve Outcome: Progressing   Problem: Education: Goal: Knowledge of General Education information will improve Description: Including pain rating scale, medication(s)/side effects and non-pharmacologic comfort measures Outcome: Not Progressing

## 2023-12-14 NOTE — Plan of Care (Signed)

## 2023-12-14 NOTE — Progress Notes (Signed)
 PROGRESS NOTE    Donna Barron  FMW:[id number] DOB: 01/29/1941 DOA: 12/12/2023 PCP: [name], NP   Brief Narrative:    83 y.o. female with medical history significant for hypertension, Alzheimer's dementia, paroxysmal atrial fibrillation presents with altered mental status and generalized weakness.  Patient was admitted with acute metabolic encephalopathy likely related to UTI with findings of Enterococcus.  Patient currently awaiting SNF bed placement.  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, without behavioral disturbance (HCC)  Assessment and Plan:   Acute metabolic encephalopathy-resolved - Multifactorial with UTI being the primary driver -UA > 50 WBC - Remains somewhat confused - B12--276>> from a neurologic standpoint, would like to see this above 400 - Folic acid 4.9>> replete   VRE UTI - Discontinue further Rocephin  and monitor -Could consider use of linezolid if becoming more symptomatic   Paroxysmal atrial fibrillation - Continue metoprolol  - Continue apixaban    Essential hypertension - Continue metoprolol    Major neurocognitive disorder - Continue Depakote  -continue lexapro    History of seizures - Continue Depakote  and Trileptal    Class 1 obesity -BMI 31.46     DVT prophylaxis:apixaban  Code Status: Full Family Communication: None at bedside Disposition Plan:  Status is: Inpatient Remains inpatient appropriate because: Need for IV medications.   Consultants:  None  Procedures:  None  Antimicrobials:  Anti-infectives (From admission, onward)    Start     Dose/Rate Route Frequency Ordered Stop   12/13/23 1500  fluconazole  (DIFLUCAN ) tablet 100 mg  Status:  Discontinued        100 mg Oral Daily 12/12/23 1706  12/13/23 1117   12/13/23 1400  cefTRIAXone  (ROCEPHIN ) 1 g in sodium chloride  0.9 % 100 mL IVPB        1 g 200 mL/hr over 30 Minutes Intravenous Every 24 hours 12/12/23 1644     12/12/23 1500  fluconazole  (DIFLUCAN ) tablet 100 mg        100 mg Oral  Once 12/12/23 1452 12/12/23 1515   12/12/23 1415  cefTRIAXone  (ROCEPHIN ) 1 g in sodium chloride  0.9 % 100 mL IVPB        1 g 200 mL/hr over 30 Minutes Intravenous  Once 12/12/23 1408 12/12/23 1645      Subjective: Patient seen and evaluated today with no new acute complaints or concerns. No acute concerns or events noted overnight.  Objective: Vitals:   12/13/23 0445 12/13/23 1300 12/13/23 1935 12/14/23 0436  BP: (!) 113/52 (!) 95/57 (!) 121/56 (!) 107/42  Pulse: (!) 58 65 65 61  Resp: 18 18 16 16   Temp: 97.6 F (36.4 C) 99 F (37.2 C) 98.5 F (36.9 C) 98.3 F (36.8 C)  TempSrc: Oral Oral Oral Oral  SpO2: 95% 95% 96% 94%  Weight:      Height:        Intake/Output Summary (Last 24 hours) at 12/14/2023 1048 Last data filed at 12/14/2023 0444 Gross per 24 hour  Intake 340 ml  Output --  Net 340 ml   Filed Weights   12/12/23 1004  Weight: 78 kg    Examination:  General exam: Appears calm and comfortable  Respiratory system: Clear to auscultation. Respiratory effort normal. Cardiovascular system: S1 & S2 heard, RRR.  Gastrointestinal system: Abdomen is soft Central nervous system: Alert and awake  Extremities: No edema Skin: No significant lesions noted Psychiatry: Flat affect.    Data Reviewed: I have personally reviewed following labs and imaging studies  CBC: Recent Labs  Lab 12/12/23 1127 12/13/23 0403  WBC 7.9 6.7  NEUTROABS 5.1  --   HGB 17.1* 15.1*  HCT 52.0* 47.6*  MCV 94.9 95.8  PLT 240 215   Basic Metabolic Panel: Recent Labs  Lab 12/12/23 1127 12/13/23 0403  NA 138 138  K 3.4* 4.3  CL 96* 103  CO2 30 27  GLUCOSE 96 71  BUN 11 6*  CREATININE 0.72 0.61  CALCIUM 9.5 9.0  MG 1.9  --     GFR: Estimated Creatinine Clearance: 52.5 mL/min (by C-G formula based on SCr of 0.61 mg/dL). Liver Function Tests: Recent Labs  Lab 12/12/23 1127  AST 24  ALT 11  ALKPHOS 76  BILITOT 0.8  PROT 6.8  ALBUMIN 3.2*   No results for input(s): LIPASE, AMYLASE in the last 168 hours. No results for input(s): AMMONIA in the last 168 hours. Coagulation Profile: No results for input(s): INR, PROTIME in the last 168 hours. Cardiac Enzymes: No results for input(s): CKTOTAL, CKMB, CKMBINDEX, TROPONINI in the last 168 hours. BNP (last 3 results) No results for input(s): PROBNP in the last 8760 hours. HbA1C: No results for input(s): HGBA1C in the last 72 hours. CBG: No results for input(s): GLUCAP in the last 168 hours. Lipid Profile: No results for input(s): CHOL, HDL, LDLCALC, TRIG, CHOLHDL, LDLDIRECT in the last 72 hours. Thyroid  Function Tests: Recent Labs    12/13/23 0706  TSH 1.610  FREET4 0.89   Anemia Panel: Recent Labs    12/13/23 0706  VITAMINB12 276  FOLATE 4.9*   Sepsis Labs: No results for input(s): PROCALCITON, LATICACIDVEN in the last 168 hours.  Recent Results (from the past 240 hours)  Urine Culture     Status: Abnormal   Collection Time: 12/12/23 11:54 AM   Specimen: Urine, Clean Catch  Result Value Ref Range Status   Specimen Description   Final    URINE, CLEAN CATCH Performed at [facility], [address], [place], [place] [address]    Special Requests   Final    NONE Performed at [facility], [address] [place], [place] [address]    Culture (A)  Final    80,000 COLONIES/mL ENTEROCOCCUS FAECIUM VANCOMYCIN RESISTANT ENTEROCOCCUS ISOLATED    Report Status 12/14/2023 FINAL  Final   Organism ID, Bacteria ENTEROCOCCUS FAECIUM (A)  Final      Susceptibility   Enterococcus faecium - MIC*    AMPICILLIN >=32 RESISTANT Resistant     NITROFURANTOIN 64 INTERMEDIATE Intermediate     VANCOMYCIN 16 INTERMEDIATE  Intermediate     * 80,000 COLONIES/mL ENTEROCOCCUS FAECIUM         Radiology Studies: CT ABDOMEN PELVIS W CONTRAST Result Date: 12/12/2023 CLINICAL DATA:  Abdominal pain possible UTI EXAM: CT ABDOMEN AND PELVIS WITH CONTRAST TECHNIQUE: Multidetector CT imaging of the abdomen and pelvis was performed using the standard protocol following bolus administration of intravenous contrast. RADIATION DOSE REDUCTION: This exam was performed according to the departmental dose-optimization program which includes automated exposure control, adjustment of the mA and/or kV according to patient size and/or use of iterative reconstruction technique. CONTRAST:  OMNIPAQUE  IOHEXOL  300 MG/ML  SOLN COMPARISON:  CT 07/18/2020 FINDINGS: Lower chest: Lung bases demonstrate no acute airspace disease. Dense mitral calcification. Coronary vascular calcification. Small hiatal hernia Hepatobiliary: No focal liver abnormality is seen.  Status post cholecystectomy. No biliary dilatation. Pancreas: Unremarkable. No pancreatic ductal dilatation or surrounding inflammatory changes. Spleen: Normal in size without focal abnormality. Adrenals/Urinary Tract: Adrenal glands are unremarkable. Kidneys are normal, without renal calculi, focal lesion, or hydronephrosis. Bladder is unremarkable. Stomach/Bowel: Status post fundoplication. No dilated small bowel. No acute bowel wall thickening Vascular/Lymphatic: Aortic atherosclerosis. No enlarged abdominal or pelvic lymph nodes. Retroaortic left renal vein. Reproductive: Uterus and bilateral adnexa are unremarkable. 1 cm right adnexal cyst for which no imaging follow-up is recommended. Other: No abdominal wall hernia or abnormality. No abdominopelvic ascites. Musculoskeletal: No acute or suspicious osseous abnormality. Grade 1 anterolisthesis L3 on L4. Multilevel degenerative changes IMPRESSION: 1. No CT evidence for acute intra-abdominal or pelvic abnormality. 2. Status post fundoplication with  small hiatal hernia. 3. Aortic atherosclerosis. Aortic Atherosclerosis (ICD10-I70.0). Electronically Signed   By: Luke Bun M.D.   On: 12/12/2023 15:06        Scheduled Meds:  apixaban   5 mg Oral BID   divalproex   500 mg Oral QHS   escitalopram   20 mg Oral QHS   feeding supplement  237 mL Oral BID BM   folic acid  1 mg Oral Daily   metoprolol  succinate  75 mg Oral Daily   OXcarbazepine   150 mg Oral BID   pantoprazole   40 mg Oral Daily   vitamin B-12  500 mcg Oral Daily   Continuous Infusions:  cefTRIAXone  (ROCEPHIN )  IV Stopped (12/13/23 1445)     LOS: 1 day    Time spent: 35 minutes    Azad Calame JONETTA Fairly, DO [facility]  If 7PM-7AM, please contact night-coverage [email or web address] 12/14/2023, 10:48 AM

## 2023-12-14 NOTE — Progress Notes (Signed)
 Mobility Specialist Progress Note:    12/14/23 1000  Mobility  Activity Stood at bedside;Transferred from bed to chair  Level of Assistance Moderate assist, patient does 50-74%  Assistive Device Front wheel walker  Distance Ambulated (ft) 2 ft  Range of Motion/Exercises Active;All extremities  Activity Response Tolerated well  Mobility Referral Yes  Mobility visit 1 Mobility  Mobility Specialist Start Time (ACUTE ONLY) 0935  Mobility Specialist Stop Time (ACUTE ONLY) [id number]  Mobility Specialist Time Calculation (min) (ACUTE ONLY) 17 min   Pt received in bed, agreeable to mobility. Required ModA to stand and ambulate with RW. Tolerated well, asx throughout. Alarm on,NT in room. All needs met.   Sherrilee Ditty Mobility Specialist Please contact via Special educational needs teacher or  Rehab office at [phone]

## 2023-12-15 DIAGNOSIS — G9341 Metabolic encephalopathy: Secondary | ICD-10-CM | POA: Diagnosis not present

## 2023-12-15 NOTE — Progress Notes (Signed)
 PROGRESS NOTE    Donna Barron  FMW:[id number] DOB: 04/07/1941 DOA: 12/12/2023 PCP: [name], NP   Brief Narrative:    83 y.o. female with medical history significant for hypertension, Alzheimer's dementia, paroxysmal atrial fibrillation presents with altered mental status and generalized weakness.  Patient was admitted with acute metabolic encephalopathy likely related to UTI with findings of Enterococcus.  Patient currently awaiting SNF bed placement.  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, without behavioral disturbance (HCC)  Assessment and Plan:   Acute metabolic encephalopathy-resolved - Multifactorial with UTI being the primary driver -UA > 50 WBC - Remains somewhat confused - B12--276>> from a neurologic standpoint, would like to see this above 400 - Folic acid 4.9>> replete   VRE UTI - Discontinue further Rocephin  and monitor -Could consider use of linezolid if becoming more symptomatic   Paroxysmal atrial fibrillation - Continue metoprolol  - Continue apixaban    Essential hypertension - Continue metoprolol    Major neurocognitive disorder - Continue Depakote  -continue lexapro    History of seizures - Continue Depakote  and Trileptal    Class 1 obesity -BMI 31.46     DVT prophylaxis:apixaban  Code Status: Full Family Communication: None at bedside Disposition Plan:  Status is: Inpatient Remains inpatient appropriate because: Need for IV medications.   Consultants:  None  Procedures:  None  Antimicrobials:  Anti-infectives (From admission, onward)    Start     Dose/Rate Route Frequency Ordered Stop   12/13/23 1500  fluconazole  (DIFLUCAN ) tablet 100 mg  Status:  Discontinued        100 mg Oral Daily 12/12/23 1706  12/13/23 1117   12/13/23 1400  cefTRIAXone  (ROCEPHIN ) 1 g in sodium chloride  0.9 % 100 mL IVPB  Status:  Discontinued        1 g 200 mL/hr over 30 Minutes Intravenous Every 24 hours 12/12/23 1644 12/14/23 1056   12/12/23 1500  fluconazole  (DIFLUCAN ) tablet 100 mg        100 mg Oral  Once 12/12/23 1452 12/12/23 1515   12/12/23 1415  cefTRIAXone  (ROCEPHIN ) 1 g in sodium chloride  0.9 % 100 mL IVPB        1 g 200 mL/hr over 30 Minutes Intravenous  Once 12/12/23 1408 12/12/23 1645      Subjective: Patient seen and evaluated today with no new acute complaints or concerns. No acute concerns or events noted overnight.  Currently asleep.  Objective: Vitals:   12/14/23 0436 12/14/23 1242 12/14/23 2040 12/15/23 0453  BP: (!) 107/42 137/68 125/77 (!) 142/63  Pulse: 61 68 67 68  Resp: 16 18 18 18   Temp: 98.3 F (36.8 C) 97.9 F (36.6 C) 98.6 F (37 C) 97.9 F (36.6 C)  TempSrc: Oral Oral Oral Oral  SpO2: 94% 97% 98% 91%  Weight:      Height:        Intake/Output Summary (Last 24 hours) at 12/15/2023 0900 Last data filed at 12/15/2023 0457 Gross per 24 hour  Intake 440 ml  Output 675 ml  Net -235 ml   Filed Weights   12/12/23 1004  Weight: 78 kg    Examination:  General exam: Appears calm and comfortable, somnolent Respiratory system: Clear to auscultation. Respiratory effort normal. Cardiovascular system: S1 & S2 heard, RRR.  Gastrointestinal system: Abdomen is soft Central  nervous system: Alert and awake Extremities: No edema Skin: No significant lesions noted Psychiatry: Flat affect.    Data Reviewed: I have personally reviewed following labs and imaging studies  CBC: Recent Labs  Lab 12/12/23 1127 12/13/23 0403  WBC 7.9 6.7  NEUTROABS 5.1  --   HGB 17.1* 15.1*  HCT 52.0* 47.6*  MCV 94.9 95.8  PLT 240 215   Basic Metabolic Panel: Recent Labs  Lab 12/12/23 1127 12/13/23 0403  NA 138 138  K 3.4* 4.3  CL 96* 103  CO2 30 27  GLUCOSE 96 71  BUN 11 6*   CREATININE 0.72 0.61  CALCIUM 9.5 9.0  MG 1.9  --    GFR: Estimated Creatinine Clearance: 52.5 mL/min (by C-G formula based on SCr of 0.61 mg/dL). Liver Function Tests: Recent Labs  Lab 12/12/23 1127  AST 24  ALT 11  ALKPHOS 76  BILITOT 0.8  PROT 6.8  ALBUMIN 3.2*   No results for input(s): LIPASE, AMYLASE in the last 168 hours. No results for input(s): AMMONIA in the last 168 hours. Coagulation Profile: No results for input(s): INR, PROTIME in the last 168 hours. Cardiac Enzymes: No results for input(s): CKTOTAL, CKMB, CKMBINDEX, TROPONINI in the last 168 hours. BNP (last 3 results) No results for input(s): PROBNP in the last 8760 hours. HbA1C: No results for input(s): HGBA1C in the last 72 hours. CBG: No results for input(s): GLUCAP in the last 168 hours. Lipid Profile: No results for input(s): CHOL, HDL, LDLCALC, TRIG, CHOLHDL, LDLDIRECT in the last 72 hours. Thyroid  Function Tests: Recent Labs    12/13/23 0706  TSH 1.610  FREET4 0.89   Anemia Panel: Recent Labs    12/13/23 0706  VITAMINB12 276  FOLATE 4.9*   Sepsis Labs: No results for input(s): PROCALCITON, LATICACIDVEN in the last 168 hours.  Recent Results (from the past 240 hours)  Urine Culture     Status: Abnormal   Collection Time: 12/12/23 11:54 AM   Specimen: Urine, Clean Catch  Result Value Ref Range Status   Specimen Description   Final    URINE, CLEAN CATCH Performed at [facility], [address] [place], [place] [address]    Special Requests   Final    NONE Performed at [facility], [address], [place], [place] [address]    Culture (A)  Final    80,000 COLONIES/mL ENTEROCOCCUS FAECIUM VANCOMYCIN RESISTANT ENTEROCOCCUS ISOLATED    Report Status 12/14/2023 FINAL  Final   Organism ID, Bacteria ENTEROCOCCUS FAECIUM (A)  Final      Susceptibility   Enterococcus faecium - MIC*    AMPICILLIN >=32 RESISTANT Resistant     NITROFURANTOIN 64  INTERMEDIATE Intermediate     VANCOMYCIN 16 INTERMEDIATE Intermediate     * 80,000 COLONIES/mL ENTEROCOCCUS FAECIUM         Radiology Studies: No results found.       Scheduled Meds:  apixaban   5 mg Oral BID   divalproex   500 mg Oral QHS   escitalopram   20 mg Oral QHS   feeding supplement  237 mL Oral BID BM   folic acid  1 mg Oral Daily   metoprolol  succinate  75 mg Oral Daily   OXcarbazepine   150 mg Oral BID   pantoprazole   40 mg Oral Daily   vitamin B-12  500 mcg Oral Daily     LOS: 2 days    Time spent: 35 minutes    Eann Cleland JONETTA Fairly, DO [facility]  If 7PM-7AM, please contact night-coverage [email or web address] 12/15/2023, 9:00 AM

## 2023-12-15 NOTE — Care Management Important Message (Signed)
 Important Message  Patient Details  Name: Donna Barron MRN: [id number] Date of Birth: Jun 23, 1940   Important Message Given:  Yes - Medicare IM (reviewed letter with daughter Reeta Kuk at [phone])     Roben Tatsch L Mashayla Lavin 12/15/2023, 2:27 PM

## 2023-12-15 NOTE — Progress Notes (Signed)
 Mobility Specialist Progress Note:    12/15/23 9077  Mobility  Activity Stood at bedside;Transferred from bed to chair  Level of Assistance Moderate assist, patient does 50-74%  Assistive Device Front wheel walker  Distance Ambulated (ft) 2 ft  Range of Motion/Exercises Active;All extremities  Activity Response Tolerated well  Mobility Referral Yes  Mobility visit 1 Mobility  Mobility Specialist Start Time (ACUTE ONLY) 0900  Mobility Specialist Stop Time (ACUTE ONLY) [id number]  Mobility Specialist Time Calculation (min) (ACUTE ONLY) 22 min   Pt received in bed, agreeable to mobility. Required ModA to stand and transfer with RW. Tolerated well, asx throughout. Left in chair, alarm on. Nursing staff in room, all needs met.   Sherrilee Ditty Mobility Specialist Please contact via Special educational needs teacher or  Rehab office at [phone]

## 2023-12-15 NOTE — TOC Progression Note (Addendum)
 Transition of Care [facility]) - Progression Note    Patient Details  Name: Donna Barron MRN: [id number] Date of Birth: 12-Jul-1940  [facility]) CM/SW Contact  Sharlyne Stabs, RN Phone Number: 12/15/2023, 10:09 AM  Clinical Narrative:   Patient approved to go to [facility] Friday. TOC following for room number and number for report.    Addendum : Tomorrow is [facility] TOC call  [phone] for assist at [facility], they are aware of the admission for Friday.  Expected Discharge Plan: Skilled Nursing Facility Barriers to Discharge: Continued Medical Work up  Expected Discharge Plan and Services       Living arrangements for the past 2 months: Single Family Home                      Social Determinants of Health (SDOH) Interventions SDOH Screenings   Food Insecurity: No Food Insecurity (12/12/2023)  Housing: Low Risk  (12/12/2023)  Transportation Needs: No Transportation Needs (12/12/2023)  Utilities: Not At Risk (12/12/2023)  Financial Resource Strain: Low Risk  (08/11/2023)   Received from [facility]  Physical Activity: Unknown (01/13/2023)   Received from [facility]  Social Connections: Moderately Isolated (12/12/2023)  Stress: No Stress Concern Present (01/13/2023)   Received from [facility]  Tobacco Use: Low Risk  (12/12/2023)    Readmission Risk Interventions    10/13/2023    9:11 PM  Readmission Risk Prevention Plan  Post Dischage Appt Complete  Medication Screening Complete  Transportation Screening Complete

## 2023-12-15 NOTE — Progress Notes (Signed)
 Mobility Specialist Progress Note:    12/15/23 1038  Mobility  Activity Stood at bedside;Transferred to/from [facility]  Level of Assistance Maximum assist, patient does 25-49%  Assistive Device BSC  Distance Ambulated (ft) 2 ft  Range of Motion/Exercises Active;All extremities  Activity Response Tolerated well  Mobility Referral Yes  Mobility visit 1 Mobility  Mobility Specialist Start Time (ACUTE ONLY) 1025  Mobility Specialist Stop Time (ACUTE ONLY) 1038  Mobility Specialist Time Calculation (min) (ACUTE ONLY) 13 min   Pt received requesting assistance to [facility]. Required MaxA to stand and transfer with no AD. Tolerated well, asx throughout. Returned to chair, NT at bedside. All needs met.  Sherrilee Ditty Mobility Specialist Please contact via Special educational needs teacher or  Rehab office at [phone]

## 2023-12-16 DIAGNOSIS — G9341 Metabolic encephalopathy: Secondary | ICD-10-CM | POA: Diagnosis not present

## 2023-12-16 LAB — CBC
HCT: 46.7 % — ABNORMAL HIGH (ref 36.0–46.0)
Hemoglobin: 15 g/dL (ref 12.0–15.0)
MCH: 30.5 pg (ref 26.0–34.0)
MCHC: 32.1 g/dL (ref 30.0–36.0)
MCV: 94.9 fL (ref 80.0–100.0)
Platelets: 209 K/uL (ref 150–400)
RBC: 4.92 MIL/uL (ref 3.87–5.11)
RDW: 14.4 % (ref 11.5–15.5)
WBC: 7.5 K/uL (ref 4.0–10.5)
nRBC: 0 % (ref 0.0–0.2)

## 2023-12-16 LAB — BASIC METABOLIC PANEL WITH GFR
Anion gap: 7 (ref 5–15)
BUN: 13 mg/dL (ref 8–23)
CO2: 27 mmol/L (ref 22–32)
Calcium: 8.9 mg/dL (ref 8.9–10.3)
Chloride: 102 mmol/L (ref 98–111)
Creatinine, Ser: 0.68 mg/dL (ref 0.44–1.00)
GFR, Estimated: >60 mL/min (ref >60)
Glucose, Bld: 120 mg/dL — ABNORMAL HIGH (ref 70–99)
Potassium: 3.7 mmol/L (ref 3.5–5.1)
Sodium: 136 mmol/L (ref 135–145)

## 2023-12-16 LAB — MAGNESIUM: Magnesium: 1.8 mg/dL (ref 1.7–2.4)

## 2023-12-16 MED ORDER — ENSURE PLUS HIGH PROTEIN PO LIQD
237.0000 mL | Freq: Two times a day (BID) | ORAL | 0 refills | Status: DC
Start: 2023-12-16 — End: 2024-01-17

## 2023-12-16 MED ORDER — CYANOCOBALAMIN 500 MCG PO TABS: 500.0000 ug | ORAL_TABLET | Freq: Every day | ORAL | 0 refills | Status: AC

## 2023-12-16 MED ORDER — FOLIC ACID 1 MG PO TABS: 1.0000 mg | ORAL_TABLET | Freq: Every day | ORAL | 0 refills | Status: AC

## 2023-12-16 NOTE — Discharge Summary (Signed)
 Physician Discharge Summary  Donna Barron FMW:[id number] DOB: 01-01-1941 DOA: 12/12/2023  PCP: [name], NP  Admit date: 12/12/2023  Discharge date: 12/16/2023  Admitted From:SNF  Disposition:  SNF  Recommendations for Outpatient Follow-up:  Follow up with PCP in 1-2 weeks Finished course of antibiotics during this admission Continue home medications as prior  Home Health: None  Equipment/Devices: None  Discharge Condition:Stable  CODE STATUS: Full  Diet recommendation: Heart Healthy  Brief/Interim Summary: 83 y.o. female with medical history significant for hypertension, Alzheimer's dementia, paroxysmal atrial fibrillation presents with altered mental status and generalized weakness.  Patient was admitted with acute metabolic encephalopathy likely related to UTI with findings of Enterococcus.  Patient was treated with Rocephin , and had significant improvement in mentation.  Final cultures did reveal VRE, however she has not been symptomatic at all with this nor has she had any cognitive decline.  She was monitored closely and no further antibiotics were used for treatment.  One could consider use of linezolid outpatient if needed.  She has been seen by PT with recommendations for SNF and finally has bed placement available and has been in stable condition for discharge for the last couple days.  No other acute events or concerns noted.  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  Discharge Instructions  Discharge Instructions     Diet - low sodium heart healthy   Complete by: As  directed    Increase activity slowly   Complete by: As directed       Allergies as of 12/16/2023       Reactions   Codeine  Swelling   Swelling of lips   Hydrocodone  Swelling   Lip swelling   Avelox [moxifloxacin Hcl In Nacl] Other (See Comments)   Insomnia    Oxybutynin Palpitations, Other (See Comments)   Tongue felt coated         Medication List     TAKE these medications    apixaban  5 MG Tabs tablet Commonly known as: Eliquis  Take 1 tablet (5 mg total) by mouth 2 (two) times daily.   cyanocobalamin  500 MCG tablet Commonly known as: VITAMIN B12 Take 1 tablet (500 mcg total) by mouth daily.   divalproex  250 MG 24 hr tablet Commonly known as: Depakote  ER Take 2 tablets every night What changed:  how much to take how to take this when to take this additional instructions   Dulcolax 5 MG EC tablet Generic drug: bisacodyl  Take 1 tablet (5 mg total) by mouth daily as needed for moderate constipation.   escitalopram  20 MG tablet Commonly known as: LEXAPRO  Take 1 tablet (20 mg total) by mouth at bedtime.   feeding supplement Liqd Take 237 mLs by mouth 2 (two) times daily between meals.   folic acid  1 MG tablet Commonly known as: FOLVITE  Take 1 tablet (1 mg total) by mouth daily.   furosemide  40 MG tablet Commonly known as: LASIX  Take 1 tablet (40 mg total) by mouth daily.   melatonin 5 MG Tabs Take 5 mg by mouth at bedtime.   metoprolol  succinate 50 MG 24 hr tablet Commonly known as: TOPROL -XL Take 1.5 tablets (75 mg total) by mouth  daily.   nystatin  cream Commonly known as: MYCOSTATIN  Apply 1 Application topically 2 (two) times daily as needed (yeast).   omeprazole 40 MG capsule Commonly known as: PRILOSEC Take 40 mg by mouth daily.   OXcarbazepine  150 MG tablet Commonly known as: TRILEPTAL  Take 1 tablet (150 mg total) by mouth 2 (two) times daily.   potassium chloride  SA 20 MEQ tablet Commonly known as: KLOR-CON  M Take 1 tablet (20 mEq  total) by mouth daily.   simvastatin  40 MG tablet Commonly known as: ZOCOR  Take 1 tablet (40 mg total) by mouth at bedtime.   Vitamin D  (Ergocalciferol ) 1.25 MG (50000 UNIT) Caps capsule Commonly known as: DRISDOL  Take 1 capsule (50,000 Units total) by mouth once a week. What changed: when to take this        Follow-up Information     [name], NP. Schedule an appointment as soon as possible for a visit in 1 week(s).   Specialty: Family Medicine Contact information: [address] [place] [address] [phone]                Allergies  Allergen Reactions   Codeine  Swelling    Swelling of lips   Hydrocodone  Swelling    Lip swelling   Avelox [Moxifloxacin Hcl In Nacl] Other (See Comments)    Insomnia    Oxybutynin Palpitations and Other (See Comments)    Tongue felt coated     Consultations: None   Procedures/Studies: CT ABDOMEN PELVIS W CONTRAST Result Date: 12/12/2023 CLINICAL DATA:  Abdominal pain possible UTI EXAM: CT ABDOMEN AND PELVIS WITH CONTRAST TECHNIQUE: Multidetector CT imaging of the abdomen and pelvis was performed using the standard protocol following bolus administration of intravenous contrast. RADIATION DOSE REDUCTION: This exam was performed according to the departmental dose-optimization program which includes automated exposure control, adjustment of the mA and/or kV according to patient size and/or use of iterative reconstruction technique. CONTRAST:  OMNIPAQUE  IOHEXOL  300 MG/ML  SOLN COMPARISON:  CT 07/18/2020 FINDINGS: Lower chest: Lung bases demonstrate no acute airspace disease. Dense mitral calcification. Coronary vascular calcification. Small hiatal hernia Hepatobiliary: No focal liver abnormality is seen. Status post cholecystectomy. No biliary dilatation. Pancreas: Unremarkable. No pancreatic ductal dilatation or surrounding inflammatory changes. Spleen: Normal in size without focal abnormality. Adrenals/Urinary  Tract: Adrenal glands are unremarkable. Kidneys are normal, without renal calculi, focal lesion, or hydronephrosis. Bladder is unremarkable. Stomach/Bowel: Status post fundoplication. No dilated small bowel. No acute bowel wall thickening Vascular/Lymphatic: Aortic atherosclerosis. No enlarged abdominal or pelvic lymph nodes. Retroaortic left renal vein. Reproductive: Uterus and bilateral adnexa are unremarkable. 1 cm right adnexal cyst for which no imaging follow-up is recommended. Other: No abdominal wall hernia or abnormality. No abdominopelvic ascites. Musculoskeletal: No acute or suspicious osseous abnormality. Grade 1 anterolisthesis L3 on L4. Multilevel degenerative changes IMPRESSION: 1. No CT evidence for acute intra-abdominal or pelvic abnormality. 2. Status post fundoplication with small hiatal hernia. 3. Aortic atherosclerosis. Aortic Atherosclerosis (ICD10-I70.0). Electronically Signed   By: Luke Bun M.D.   On: 12/12/2023 15:06   DG Chest Port 1 View Result Date: 12/12/2023 CLINICAL DATA:  Sepsis. EXAM: PORTABLE CHEST 1 VIEW COMPARISON:  Chest radiograph dated 10/22/2022. FINDINGS: No focal consolidation, pleural effusion, or pneumothorax. The cardiac silhouette is within normal limits. No acute osseous pathology. IMPRESSION: No active disease. Electronically Signed   By: Vanetta Chou M.D.   On: 12/12/2023 10:55     Discharge Exam: Vitals:   12/15/23 2242 12/16/23  0421  BP: 111/64 (!) 110/48  Pulse: 70 73  Resp:  20  Temp:  98 F (36.7 C)  SpO2:  91%   Vitals:   12/15/23 1229 12/15/23 2137 12/15/23 2242 12/16/23 0421  BP: 118/71 (!) 106/39 111/64 (!) 110/48  Pulse: 71 70 70 73  Resp: 18 20  20   Temp: 98 F (36.7 C) 98 F (36.7 C)  98 F (36.7 C)  TempSrc: Oral Oral  Oral  SpO2: 96% 94%  91%  Weight:      Height:        General: Pt is alert, awake, not in acute distress Cardiovascular: RRR, S1/S2 +, no rubs, no gallops Respiratory: CTA bilaterally, no wheezing,  no rhonchi Abdominal: Soft, NT, ND, bowel sounds + Extremities: no edema, no cyanosis    The results of significant diagnostics from this hospitalization (including imaging, microbiology, ancillary and laboratory) are listed below for reference.     Microbiology: Recent Results (from the past 240 hours)  Urine Culture     Status: Abnormal   Collection Time: 12/12/23 11:54 AM   Specimen: Urine, Clean Catch  Result Value Ref Range Status   Specimen Description   Final    URINE, CLEAN CATCH Performed at [facility], [address], [place], [place] [address]    Special Requests   Final    NONE Performed at [facility], [address], [place], [place] [address]    Culture (A)  Final    80,000 COLONIES/mL ENTEROCOCCUS FAECIUM VANCOMYCIN RESISTANT ENTEROCOCCUS ISOLATED    Report Status 12/14/2023 FINAL  Final   Organism ID, Bacteria ENTEROCOCCUS FAECIUM (A)  Final      Susceptibility   Enterococcus faecium - MIC*    AMPICILLIN >=32 RESISTANT Resistant     NITROFURANTOIN 64 INTERMEDIATE Intermediate     VANCOMYCIN 16 INTERMEDIATE Intermediate     * 80,000 COLONIES/mL ENTEROCOCCUS FAECIUM     Labs: BNP (last 3 results) Recent Labs    12/12/23 1127  BNP 37.0   Basic Metabolic Panel: Recent Labs  Lab 12/12/23 1127 12/13/23 0403 12/16/23 0407  NA 138 138 136  K 3.4* 4.3 3.7  CL 96* 103 102  CO2 30 27 27   GLUCOSE 96 71 120*  BUN 11 6* 13  CREATININE 0.72 0.61 0.68  CALCIUM 9.5 9.0 8.9  MG 1.9  --  1.8   Liver Function Tests: Recent Labs  Lab 12/12/23 1127  AST 24  ALT 11  ALKPHOS 76  BILITOT 0.8  PROT 6.8  ALBUMIN 3.2*   No results for input(s): LIPASE, AMYLASE in the last 168 hours. No results for input(s): AMMONIA in the last 168 hours. CBC: Recent Labs  Lab 12/12/23 1127 12/13/23 0403 12/16/23 0407  WBC 7.9 6.7 7.5  NEUTROABS 5.1  --   --   HGB 17.1* 15.1* 15.0  HCT 52.0* 47.6* 46.7*  MCV 94.9 95.8 94.9  PLT 240 215 209   Cardiac  Enzymes: No results for input(s): CKTOTAL, CKMB, CKMBINDEX, TROPONINI in the last 168 hours. BNP: Invalid input(s): POCBNP CBG: No results for input(s): GLUCAP in the last 168 hours. D-Dimer No results for input(s): DDIMER in the last 72 hours. Hgb A1c No results for input(s): HGBA1C in the last 72 hours. Lipid Profile No results for input(s): CHOL, HDL, LDLCALC, TRIG, CHOLHDL, LDLDIRECT in the last 72 hours. Thyroid  function studies No results for input(s): TSH, T4TOTAL, T3FREE, THYROIDAB in the last 72 hours.  Invalid input(s): FREET3 Anemia work  up No results for input(s): VITAMINB12, FOLATE, FERRITIN, TIBC, IRON, RETICCTPCT in the last 72 hours. Urinalysis    Component Value Date/Time   COLORURINE YELLOW 12/12/2023 1154   APPEARANCEUR TURBID (A) 12/12/2023 1154   LABSPEC 1.026 12/12/2023 1154   PHURINE 5.0 12/12/2023 1154   GLUCOSEU NEGATIVE 12/12/2023 1154   HGBUR MODERATE (A) 12/12/2023 1154   BILIRUBINUR NEGATIVE 12/12/2023 1154   KETONESUR 5 (A) 12/12/2023 1154   PROTEINUR 100 (A) 12/12/2023 1154   NITRITE NEGATIVE 12/12/2023 1154   LEUKOCYTESUR SMALL (A) 12/12/2023 1154   Sepsis Labs Recent Labs  Lab 12/12/23 1127 12/13/23 0403 12/16/23 0407  WBC 7.9 6.7 7.5   Microbiology Recent Results (from the past 240 hours)  Urine Culture     Status: Abnormal   Collection Time: 12/12/23 11:54 AM   Specimen: Urine, Clean Catch  Result Value Ref Range Status   Specimen Description   Final    URINE, CLEAN CATCH Performed at [facility], [address] [place], [place] [address]    Special Requests   Final    NONE Performed at [facility], [address], [place], [place] [address]    Culture (A)  Final    80,000 COLONIES/mL ENTEROCOCCUS FAECIUM VANCOMYCIN RESISTANT ENTEROCOCCUS ISOLATED    Report Status 12/14/2023 FINAL  Final   Organism ID, Bacteria ENTEROCOCCUS FAECIUM (A)  Final      Susceptibility    Enterococcus faecium - MIC*    AMPICILLIN >=32 RESISTANT Resistant     NITROFURANTOIN 64 INTERMEDIATE Intermediate     VANCOMYCIN 16 INTERMEDIATE Intermediate     * 80,000 COLONIES/mL ENTEROCOCCUS FAECIUM     Time coordinating discharge: 35 minutes  SIGNED:   Adron JONETTA Fairly, DO [facility] 12/16/2023, 9:20 AM  If 7PM-7AM, please contact night-coverage [email or web address]

## 2023-12-16 NOTE — TOC Progression Note (Addendum)
 Transition of Care [facility]) - Progression Note    Patient Details  Name: Donna Barron MRN: [id number] Date of Birth: 1941-06-11  Transition of Care [facility]) CM/SW Contact  Lorraine LILLETTE Fenton, LCSW Phone Number: 12/16/2023, 9:53 AM  Clinical Narrative:    CSW learned in progression, pt clear for DC to [facility].  9:55- [facility] for RR info- RN can call [phone] Rm is 119. [place] to RN staff with information.   Addendum: CSW made call to daughter advising of DC to [facility].  TOC signing off, pt DC to [facility].   Expected Discharge Plan: Skilled Nursing Facility Barriers to Discharge: No Barriers Identified  Expected Discharge Plan and Services       Living arrangements for the past 2 months: Single Family Home Expected Discharge Date: 12/16/23                                     Social Determinants of Health (SDOH) Interventions SDOH Screenings   Food Insecurity: No Food Insecurity (12/12/2023)  Housing: Low Risk  (12/12/2023)  Transportation Needs: No Transportation Needs (12/12/2023)  Utilities: Not At Risk (12/12/2023)  Financial Resource Strain: Low Risk  (08/11/2023)   Received from [facility]  Physical Activity: Unknown (01/13/2023)   Received from [facility]  Social Connections: Moderately Isolated (12/12/2023)  Stress: No Stress Concern Present (01/13/2023)   Received from [facility]  Tobacco Use: Low Risk  (12/12/2023)    Readmission Risk Interventions    10/13/2023    9:11 PM  Readmission Risk Prevention Plan  Post Dischage Appt Complete  Medication Screening Complete  Transportation Screening Complete

## 2023-12-22 ENCOUNTER — Non-Acute Institutional Stay (SKILLED_NURSING_FACILITY): Payer: Self-pay | Admitting: Internal Medicine

## 2023-12-22 ENCOUNTER — Encounter: Payer: Self-pay | Admitting: Internal Medicine

## 2023-12-22 DIAGNOSIS — F0390 Unspecified dementia without behavioral disturbance: Secondary | ICD-10-CM | POA: Diagnosis not present

## 2023-12-22 DIAGNOSIS — N390 Urinary tract infection, site not specified: Secondary | ICD-10-CM | POA: Diagnosis not present

## 2023-12-22 DIAGNOSIS — G9341 Metabolic encephalopathy: Secondary | ICD-10-CM

## 2023-12-22 NOTE — Assessment & Plan Note (Signed)
 Urologic evaluation clinically indicated in context of the recurrent UTIs.  If symptoms recur; repeat C&S but initiate linezolid pending final results.

## 2023-12-22 NOTE — Patient Instructions (Signed)
 See assessment and plan under each diagnosis in the problem list and acutely for this visit

## 2023-12-22 NOTE — Progress Notes (Signed)
 NURSING HOME LOCATION:  [facility] ROOM NUMBER: 119P  CODE STATUS: Full Code  PCP: Aldona Pizza NP  This is a comprehensive admission note to this SNFperformed on this date less than 30 days from date of admission. Included are preadmission medical/surgical history; reconciled medication list; family history; social history and comprehensive review of systems.  Corrections and additions to the records were documented. Comprehensive physical exam was also performed. Additionally a clinical summary was entered for each active diagnosis pertinent to this admission in the Problem List to enhance continuity of care.  HPI: She was hospitalized 6/30 - 12/16/2023 presenting with altered mental status and generalized weakness.  The acute metabolic encephalopathy was attributed to Enterococcus UTI for which she received Rocephin  with significant improvement in her mentation.  Final cultures did reveal 80,000 colonies of vancomycin-resistant Enterococcus ; however, she has been asymptomatic without additional cognitive decline.  Were UTI to recur, linezolid as an outpatient was to be considered. Mild hypokalemia was present with a value of 3.4; this was corrected.  Albumin was 3.2 and total protein 6.8. TFTs were normal.  B12 level was low normal at 276.  On 10/19/2023 RPR had been nonreactive.  On that same date B12 level had been 236. Mild hyperglycemia was present with a peak glucose of 120.  Serially  white blood count was normal. Of note, she had also been hospitalized in early May of this year with altered mental status in the context of E. coli UTI.  On 10/13/2023 culture revealed greater than 100,000 colonies of E. coli as well as Rothia Kristinae.  No sensitivities were provided for the second organism. PT/OT recommended SNF placement for rehab.  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   Surgical procedures include cholecystectomy and hysteroscopy with D&C.  Family history: reviewed, non contributory due to advanced age.  Social history: Non-smoker, nondrinker.   Review of systems: Clinical neurocognitive deficits made responses invalid. All responses were negative except for some swelling in a finger she related to a fall. Sh ecould not give date of fall.  Constitutional: No fever, significant weight change, fatigue  Eyes: No redness, discharge, pain, vision change ENT/mouth: No nasal congestion, purulent discharge, earache, change in hearing, sore throat  Cardiovascular: No chest pain, palpitations, paroxysmal nocturnal dyspnea, claudication, edema  Respiratory: No cough, sputum production, hemoptysis, DOE, significant snoring, apnea Gastrointestinal: No heartburn, dysphagia, abdominal pain, nausea /vomiting, rectal bleeding, melena, change in bowels Genitourinary: No dysuria, hematuria, pyuria, incontinence, nocturia Musculoskeletal: No joint stiffness, joint swelling, weakness, pain Dermatologic: No rash, pruritus, change in appearance of skin Neurologic: No dizziness, headache, syncope, seizures, numbness, tingling Psychiatric: No significant anxiety, depression, insomnia, anorexia Endocrine: No change in hair/skin/nails, excessive thirst, excessive hunger, excessive urination  Hematologic/lymphatic: No significant bruising, lymphadenopathy, abnormal bleeding Allergy/immunology: No itchy/watery eyes, significant sneezing, urticaria, angioedema  Physical exam:  Pertinent or positive findings: She could not define reason for admission, stating didn't find anything. Affect is flat & facies masked. Ptosis present bilaterally, > on left.Upper plate present. Mandibular exam limited. Minimal chin hirsutism present.Rhythm slightly irregular.Abdomen is protuberant.Pedal pulses are decreased.1/2 + edema @ sock lne. Skin dry &  keratotic over shins.Slight ecchymosis & enlargement of 3rd R digit present.  General appearance: Adequately nourished; no acute distress, increased work of breathing is present.   Lymphatic: No lymphadenopathy about the head, neck, axilla. Eyes: No conjunctival inflammation or  lid edema is present. There is no scleral icterus. Ears:  External ear exam shows no significant lesions or deformities.   Nose:  External nasal examination shows no deformity or inflammation. Nasal mucosa are pink and moist without lesions, exudates Neck:  No thyromegaly, masses, tenderness noted.    Heart:  No gallop, murmur, click, rub.  Lungs: Chest clear to auscultation without wheezes, rhonchi, rales, rubs. Abdomen: Bowel sounds are normal.  Abdomen is soft and nontender with no organomegaly, hernias, masses. GU: Deferred  Extremities:  No cyanosis, clubbing Neurologic exam:  Balance, Rhomberg, finger to nose testing could not be completed due to clinical state Skin: Warm & dry w/o tenting.  See clinical summary under each active problem in the Problem List with associated updated therapeutic plan

## 2023-12-22 NOTE — Assessment & Plan Note (Signed)
 The BCAT is a licensed MMSE assessment tool employed by [employer] [facility]. Scoring:46-50 ... Normal. 34-45.SABRASABRAMild Cognitive Impairment. 26-33.SABRASABRAMild Dementia. 0-25.. Moderate-Severe Dementia. 10/18/23 score was 14/50. 12/16/23 score was 19/50. Both compatible with mod-severe dementia.

## 2023-12-22 NOTE — Assessment & Plan Note (Addendum)
 Recurrent acute metabolic encephalopathy has been attributed to UTI.  Cultures have revealed VRE Enterococcus with 80,000 colonies on 6/30 and 100,000 colonies of E. coli and Rothia organism on 5/1.  The UTIs appear to be recurrent and simply exacerbate her pre-existing neurocognitive deficit.  Urologic evaluation to look for any predisposition to recurrent UTI indicated.

## 2023-12-30 ENCOUNTER — Encounter: Payer: Self-pay | Admitting: Adult Health

## 2023-12-30 ENCOUNTER — Non-Acute Institutional Stay (SKILLED_NURSING_FACILITY): Payer: Self-pay | Admitting: Adult Health

## 2023-12-30 DIAGNOSIS — R600 Localized edema: Secondary | ICD-10-CM

## 2023-12-30 DIAGNOSIS — I480 Paroxysmal atrial fibrillation: Secondary | ICD-10-CM

## 2023-12-30 DIAGNOSIS — F0390 Unspecified dementia without behavioral disturbance: Secondary | ICD-10-CM | POA: Diagnosis not present

## 2023-12-30 NOTE — Progress Notes (Signed)
 Location:  [facility] Room Number: 119/P Place of Service:  SNF (31)   CODE STATUS: full code   Allergies  Allergen Reactions   Codeine  Swelling    Swelling of lips   Hydrocodone  Swelling    Lip swelling   Avelox [Moxifloxacin Hcl In Nacl] Other (See Comments)    Insomnia    Oxybutynin Palpitations and Other (See Comments)    Tongue felt coated     Chief Complaint  Patient presents with   Care plan meeting    HPI:  We have come together for her care plan meeting. Family present. BIMS no mood no. She is out of bed to wheelchair has had one fall without injury. She requires dependent assist with her adl care. She is incontinent of bladder and bowel. Dietary: regular diet; setup for meals; appetite 26-100% of meals; weight is 178 pounds; is on ensure daily and is in restorative dining.  Therapy: ambulate 50 feet with walker and max assist; upper and lower body max assist; bpr: max assist. Activities: is encouraged to attend. 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   Bilateral lower extremity edema  Past Medical History:  Diagnosis Date   Anxiety    Cataract    Colon polyps    Dementia (HCC)    Depression    Dysrhythmia    heart palipations   Gallstones    GERD (gastroesophageal reflux disease)    HTN (hypertension)    Hypercholesteremia    PONV (postoperative nausea and vomiting)     Past Surgical History:  Procedure Laterality Date   APPENDECTOMY     ARTHROSCOPIC REPAIR ACL  05/2008   Left knee   CARPAL TUNNEL RELEASE Right 07/14/2017   Procedure: CARPAL TUNNEL RELEASE;  Surgeon: Margrette Taft BRAVO, MD;  Location: [facility];  Service: Orthopedics;  Laterality: Right;   CATARACT EXTRACTION, BILATERAL  2020   CHOLECYSTECTOMY     DILATION AND CURETTAGE OF UTERUS     DORSAL  COMPARTMENT RELEASE Right 07/14/2017   Procedure: RELEASE DORSAL COMPARTMENT (DEQUERVAIN);  Surgeon: Margrette Taft BRAVO, MD;  Location: [facility];  Service: Orthopedics;  Laterality: Right;   HIATAL HERNIA REPAIR N/A 03/24/2020   Procedure: LAPAROSCOPIC REPAIR OF HIATAL HERNIA, nissen fundoplication;  Surgeon: Gladis Cough, MD;  Location: [facility];  Service: General;  Laterality: N/A;   HYSTEROSCOPY WITH D & C N/A 12/25/2013   Procedure: DILATATION AND CURETTAGE /HYSTEROSCOPY;  Surgeon: Peggye Gull, MD;  Location: [facility];  Service: Gynecology;  Laterality: N/A;   SHOULDER OPEN ROTATOR CUFF REPAIR Left 06/15/2018   Procedure: ROTATOR CUFF REPAIR SHOULDER OPEN WITH ACROMIOPLASTY;  Surgeon: Margrette Taft BRAVO, MD;  Location: [facility];  Service: Orthopedics;  Laterality: Left;   TUBAL LIGATION      Social History   Socioeconomic History   Marital status: Widowed    Spouse name: Not on file   Number of children: 4   Years of education: Not on file   Highest education level: Not on file  Occupational History   Occupation: retired    Associate Professor: OTHER  Tobacco Use   Smoking status: Never   Smokeless tobacco: Never  Vaping Use   Vaping status: Never Used  Substance and Sexual Activity   Alcohol use: Not Currently   Drug use: No   Sexual activity: Not Currently  Other Topics Concern  Not on file  Social History Narrative   Are you right handed or left handed? Right    Are you currently employed ? no   What is your current occupation?   Do you live at home alone? No    Who lives with you? daughter   What type of home do you live in: 1 story or 2 story? 1 story        Social Drivers of Corporate investment banker Strain: Low Risk  (08/11/2023)   Received [facility]   Overall Financial Resource Strain (CARDIA)    Difficulty of Paying Living Expenses: Not very hard  Food Insecurity: No Food Insecurity (12/12/2023)   Hunger Vital Sign    Worried About Running Out of Food in the Last  Year: Never true    Ran Out of Food in the Last Year: Never true  Transportation Needs: No Transportation Needs (12/12/2023)   PRAPARE - Administrator, Civil Service (Medical): No    Lack of Transportation (Non-Medical): No  Physical Activity: Unknown (01/13/2023)   Received [facility]   Exercise Vital Sign    On average, how many days per week do you engage in moderate to strenuous exercise (like a brisk walk)?: 0 days    Minutes of Exercise per Session: Not on file  Stress: No Stress Concern Present (01/13/2023)   Received from [facility] - Occupational Stress Questionnaire    Feeling of Stress : Only a little  Social Connections: Moderately Isolated (12/12/2023)   Social Connection and Isolation Panel    Frequency of Communication with Friends and Family: Once a week    Frequency of Social Gatherings with Friends and Family: Once a week    Attends Religious Services: 1 to 4 times per year    Active Member of [employer] or Organizations: No    Attends Banker Meetings: 1 to 4 times per year    Marital Status: Widowed  Intimate Partner Violence: Not At Risk (12/12/2023)   Humiliation, Afraid, Rape, and Kick questionnaire    Fear of Current or Ex-Partner: No    Emotionally Abused: No    Physically Abused: No    Sexually Abused: No   Family History  Problem Relation Age of Onset   Heart failure Mother 64   Lung cancer Father 25   Breast cancer Sister    Diabetes Sister    Colon cancer Brother    Colon polyps Son    Colon polyps Daughter       VITAL SIGNS BP 112/65 Comment: Taken on 12/27/23  Pulse 74   Ht 5' 2 (1.575 m)   Wt 178 lb 8 oz (81 kg)   BMI 32.65 kg/m   Outpatient Encounter Medications as of 12/30/2023  Medication Sig Note   apixaban  (ELIQUIS ) 5 MG TABS tablet Take 1 tablet (5 mg total) by mouth 2 (two) times daily.    bisacodyl  (DULCOLAX) 5 MG EC tablet Take 1 tablet (5 mg total) by mouth  daily as needed for moderate constipation.    divalproex  (DEPAKOTE  ER) 250 MG 24 hr tablet Take 2 tablets every night    escitalopram  (LEXAPRO ) 20 MG tablet Take 1 tablet (20 mg total) by mouth at bedtime.    feeding supplement (ENSURE PLUS HIGH PROTEIN) LIQD Take 237 mLs by mouth 2 (two) times daily between meals.    folic acid  (FOLVITE ) 1 MG tablet Take 1  tablet (1 mg total) by mouth daily.    furosemide  (LASIX ) 40 MG tablet Take 1 tablet (40 mg total) by mouth daily.    melatonin 5 MG TABS Take 5 mg by mouth at bedtime.    metoprolol  succinate (TOPROL -XL) 50 MG 24 hr tablet Take 1.5 tablets (75 mg total) by mouth daily.    nystatin  powder Apply 1 Application topically 2 (two) times daily as needed.    omeprazole (PRILOSEC) 40 MG capsule Take 40 mg by mouth daily.    OXcarbazepine  (TRILEPTAL ) 150 MG tablet Take 1 tablet (150 mg total) by mouth 2 (two) times daily.    potassium chloride  SA (KLOR-CON  M) 20 MEQ tablet Take 1 tablet (20 mEq total) by mouth daily. 12/12/2023: Family would prefer liquid formula if available   simvastatin  (ZOCOR ) 40 MG tablet Take 1 tablet (40 mg total) by mouth at bedtime.    vitamin B-12 (VITAMIN B12) 500 MCG tablet Take 1 tablet (500 mcg total) by mouth daily.    Vitamin D , Ergocalciferol , (DRISDOL ) 1.25 MG (50000 UNIT) CAPS capsule Take 1 capsule (50,000 Units total) by mouth once a week. (Patient taking differently: Take 50,000 Units by mouth every Tuesday.)    nystatin  cream (MYCOSTATIN ) Apply 1 Application topically 2 (two) times daily as needed (yeast). (Patient not taking: Reported on 12/30/2023)    Facility-Administered Encounter Medications as of 12/30/2023  Medication   fentaNYL  (SUBLIMAZE ) injection 25-50 mcg     SIGNIFICANT DIAGNOSTIC EXAMS   LABS REVIEWED:   10-16-23: wbc 7.5; hgb 15.1; hct 46.2; mcv 93.0 plt 251; glucose 89; bun 7; creat 0.55; k+ 4.1; na++ 137; ca 8.8; gfr >60; mag 1.9 10-19-23: tsh 3.926 vitamin B 12: 236; RPR: nr  Review of  Systems  Constitutional:  Negative for malaise/fatigue.  Respiratory:  Negative for cough and shortness of breath.   Cardiovascular:  Negative for chest pain, palpitations and leg swelling.  Gastrointestinal:  Negative for abdominal pain, constipation and heartburn.  Musculoskeletal:  Negative for back pain, joint pain and myalgias.  Skin: Negative.   Neurological:  Negative for dizziness.  Psychiatric/Behavioral:  The patient is not nervous/anxious.     Physical Exam Constitutional:      General: She is not in acute distress.    Appearance: She is well-developed. She is not diaphoretic.  Neck:     Thyroid : No thyromegaly.  Cardiovascular:     Rate and Rhythm: Normal rate and regular rhythm.     Pulses: Normal pulses.     Heart sounds: Normal heart sounds.  Pulmonary:     Effort: Pulmonary effort is normal. No respiratory distress.     Breath sounds: Normal breath sounds.  Abdominal:     General: Bowel sounds are normal. There is no distension.     Palpations: Abdomen is soft.     Tenderness: There is no abdominal tenderness.  Musculoskeletal:        General: Normal range of motion.     Cervical back: Neck supple.     Right lower leg: Edema present.     Left lower leg: Edema present.  Lymphadenopathy:     Cervical: No cervical adenopathy.  Skin:    General: Skin is warm and dry.  Neurological:     Mental Status: She is alert. Mental status is at baseline.  Psychiatric:        Mood and Affect: Mood normal.      ASSESSMENT/ PLAN:  TODAY  Paroxsymal atrial fibrillation Mild major neurocognitive disorder  probably related to alzheimer's disease without behavioral disturbance Bilateral lower extremity edema  Will continue current medications Will continue therapy as directed Will continue to monitor her status.   Time spent with patient: 40 minutes: Therapy; medications; dietary    Barnie Seip NP [facility]  call [phone]

## 2024-01-14 ENCOUNTER — Other Ambulatory Visit: Payer: Self-pay

## 2024-01-14 ENCOUNTER — Encounter (HOSPITAL_COMMUNITY): Payer: Self-pay

## 2024-01-14 ENCOUNTER — Inpatient Hospital Stay (HOSPITAL_COMMUNITY)
Admission: EM | Admit: 2024-01-14 | Discharge: 2024-01-17 | DRG: 689 | Disposition: A | Discharge status: Skilled Nursing Facility | Source: Skilled Nursing Facility | Attending: Internal Medicine | Admitting: Internal Medicine

## 2024-01-14 ENCOUNTER — Emergency Department (HOSPITAL_COMMUNITY)

## 2024-01-14 DIAGNOSIS — Z6834 Body mass index (BMI) 34.0-34.9, adult: Secondary | ICD-10-CM

## 2024-01-14 DIAGNOSIS — Z833 Family history of diabetes mellitus: Secondary | ICD-10-CM

## 2024-01-14 DIAGNOSIS — Z83719 Family history of colon polyps, unspecified: Secondary | ICD-10-CM

## 2024-01-14 DIAGNOSIS — G9340 Encephalopathy, unspecified: Secondary | ICD-10-CM | POA: Diagnosis not present

## 2024-01-14 DIAGNOSIS — F0283 Dementia in other diseases classified elsewhere, unspecified severity, with mood disturbance: Secondary | ICD-10-CM | POA: Diagnosis present

## 2024-01-14 DIAGNOSIS — A4150 Gram-negative sepsis, unspecified: Secondary | ICD-10-CM | POA: Diagnosis present

## 2024-01-14 DIAGNOSIS — N390 Urinary tract infection, site not specified: Principal | ICD-10-CM | POA: Diagnosis present

## 2024-01-14 DIAGNOSIS — Z801 Family history of malignant neoplasm of trachea, bronchus and lung: Secondary | ICD-10-CM | POA: Diagnosis not present

## 2024-01-14 DIAGNOSIS — Z9841 Cataract extraction status, right eye: Secondary | ICD-10-CM

## 2024-01-14 DIAGNOSIS — I10 Essential (primary) hypertension: Secondary | ICD-10-CM | POA: Diagnosis present

## 2024-01-14 DIAGNOSIS — Z860100 Personal history of colon polyps, unspecified: Secondary | ICD-10-CM

## 2024-01-14 DIAGNOSIS — Z885 Allergy status to narcotic agent status: Secondary | ICD-10-CM

## 2024-01-14 DIAGNOSIS — Z9189 Other specified personal risk factors, not elsewhere classified: Secondary | ICD-10-CM | POA: Diagnosis not present

## 2024-01-14 DIAGNOSIS — Z800 Family history of malignant neoplasm of digestive organs: Secondary | ICD-10-CM | POA: Diagnosis not present

## 2024-01-14 DIAGNOSIS — R4701 Aphasia: Secondary | ICD-10-CM | POA: Diagnosis present

## 2024-01-14 DIAGNOSIS — Z87440 Personal history of urinary (tract) infections: Secondary | ICD-10-CM

## 2024-01-14 DIAGNOSIS — Z9842 Cataract extraction status, left eye: Secondary | ICD-10-CM

## 2024-01-14 DIAGNOSIS — G9341 Metabolic encephalopathy: Secondary | ICD-10-CM | POA: Diagnosis present

## 2024-01-14 DIAGNOSIS — Z79899 Other long term (current) drug therapy: Secondary | ICD-10-CM

## 2024-01-14 DIAGNOSIS — Z7901 Long term (current) use of anticoagulants: Secondary | ICD-10-CM

## 2024-01-14 DIAGNOSIS — Z803 Family history of malignant neoplasm of breast: Secondary | ICD-10-CM

## 2024-01-14 DIAGNOSIS — B9620 Unspecified Escherichia coli [E. coli] as the cause of diseases classified elsewhere: Secondary | ICD-10-CM | POA: Diagnosis present

## 2024-01-14 DIAGNOSIS — Z888 Allergy status to other drugs, medicaments and biological substances status: Secondary | ICD-10-CM | POA: Diagnosis not present

## 2024-01-14 DIAGNOSIS — F0390 Unspecified dementia without behavioral disturbance: Secondary | ICD-10-CM | POA: Diagnosis not present

## 2024-01-14 DIAGNOSIS — I480 Paroxysmal atrial fibrillation: Secondary | ICD-10-CM | POA: Diagnosis present

## 2024-01-14 DIAGNOSIS — N3000 Acute cystitis without hematuria: Secondary | ICD-10-CM | POA: Diagnosis not present

## 2024-01-14 DIAGNOSIS — G40909 Epilepsy, unspecified, not intractable, without status epilepticus: Secondary | ICD-10-CM | POA: Diagnosis present

## 2024-01-14 DIAGNOSIS — E66811 Obesity, class 1: Secondary | ICD-10-CM | POA: Diagnosis present

## 2024-01-14 DIAGNOSIS — G309 Alzheimer's disease, unspecified: Secondary | ICD-10-CM | POA: Diagnosis present

## 2024-01-14 DIAGNOSIS — R413 Other amnesia: Secondary | ICD-10-CM | POA: Diagnosis present

## 2024-01-14 DIAGNOSIS — E7800 Pure hypercholesterolemia, unspecified: Secondary | ICD-10-CM | POA: Diagnosis present

## 2024-01-14 DIAGNOSIS — F32A Depression, unspecified: Secondary | ICD-10-CM | POA: Diagnosis present

## 2024-01-14 DIAGNOSIS — Z8249 Family history of ischemic heart disease and other diseases of the circulatory system: Secondary | ICD-10-CM | POA: Diagnosis not present

## 2024-01-14 DIAGNOSIS — K219 Gastro-esophageal reflux disease without esophagitis: Secondary | ICD-10-CM | POA: Diagnosis present

## 2024-01-14 DIAGNOSIS — Z9049 Acquired absence of other specified parts of digestive tract: Secondary | ICD-10-CM

## 2024-01-14 LAB — COMPREHENSIVE METABOLIC PANEL WITH GFR
ALT: 12 U/L (ref 0–44)
AST: 17 U/L (ref 15–41)
Albumin: 3 g/dL — ABNORMAL LOW (ref 3.5–5.0)
Alkaline Phosphatase: 71 U/L (ref 38–126)
Anion gap: 12 (ref 5–15)
BUN: 15 mg/dL (ref 8–23)
CO2: 28 mmol/L (ref 22–32)
Calcium: 9.2 mg/dL (ref 8.9–10.3)
Chloride: 94 mmol/L — ABNORMAL LOW (ref 98–111)
Creatinine, Ser: 0.77 mg/dL (ref 0.44–1.00)
GFR, Estimated: >60 mL/min (ref >60)
Glucose, Bld: 134 mg/dL — ABNORMAL HIGH (ref 70–99)
Potassium: 3.7 mmol/L (ref 3.5–5.1)
Sodium: 134 mmol/L — ABNORMAL LOW (ref 135–145)
Total Bilirubin: 0.4 mg/dL (ref 0.0–1.2)
Total Protein: 6.7 g/dL (ref 6.5–8.1)

## 2024-01-14 LAB — DIFFERENTIAL
Abs Immature Granulocytes: 0.02 K/uL (ref 0.00–0.07)
Basophils Absolute: 0 K/uL (ref 0.0–0.1)
Basophils Relative: 0 %
Eosinophils Absolute: 0 K/uL (ref 0.0–0.5)
Eosinophils Relative: 0 %
Immature Granulocytes: 0 %
Lymphocytes Relative: 19 %
Lymphs Abs: 1.6 K/uL (ref 0.7–4.0)
Monocytes Absolute: 0.8 K/uL (ref 0.1–1.0)
Monocytes Relative: 9 %
Neutro Abs: 6.1 K/uL (ref 1.7–7.7)
Neutrophils Relative %: 72 %

## 2024-01-14 LAB — URINALYSIS, ROUTINE W REFLEX MICROSCOPIC
Bilirubin Urine: NEGATIVE
Glucose, UA: NEGATIVE mg/dL
Ketones, ur: NEGATIVE mg/dL
Nitrite: NEGATIVE
Protein, ur: 30 mg/dL — AB
Specific Gravity, Urine: 1.01 (ref 1.005–1.030)
WBC, UA: >50 WBC/hpf (ref 0–5)
pH: 6 (ref 5.0–8.0)

## 2024-01-14 LAB — CBC
HCT: 47.5 % — ABNORMAL HIGH (ref 36.0–46.0)
Hemoglobin: 15.5 g/dL — ABNORMAL HIGH (ref 12.0–15.0)
MCH: 31.3 pg (ref 26.0–34.0)
MCHC: 32.6 g/dL (ref 30.0–36.0)
MCV: 95.8 fL (ref 80.0–100.0)
Platelets: 209 K/uL (ref 150–400)
RBC: 4.96 MIL/uL (ref 3.87–5.11)
RDW: 13.7 % (ref 11.5–15.5)
WBC: 8.6 K/uL (ref 4.0–10.5)
nRBC: 0 % (ref 0.0–0.2)

## 2024-01-14 LAB — PROTIME-INR
INR: 1.1 (ref 0.8–1.2)
Prothrombin Time: 14.3 s (ref 11.4–15.2)

## 2024-01-14 LAB — RAPID URINE DRUG SCREEN, HOSP PERFORMED
Amphetamines: NOT DETECTED
Barbiturates: NOT DETECTED
Benzodiazepines: NOT DETECTED
Cocaine: NOT DETECTED
Opiates: NOT DETECTED
Tetrahydrocannabinol: NOT DETECTED

## 2024-01-14 LAB — APTT: aPTT: 23 s — ABNORMAL LOW (ref 24–36)

## 2024-01-14 LAB — ETHANOL: Alcohol, Ethyl (B): <15 mg/dL (ref <15)

## 2024-01-14 MED ORDER — IOHEXOL 350 MG/ML SOLN
100.0000 mL | Freq: Once | INTRAVENOUS | Status: AC | PRN
  Administered 2024-01-14: 100 mL via INTRAVENOUS

## 2024-01-14 MED ORDER — SODIUM CHLORIDE 0.9 % IV SOLN
2.0000 g | Freq: Once | INTRAVENOUS | Status: AC
  Administered 2024-01-14: 2 g via INTRAVENOUS
  Filled 2024-01-14: qty 20

## 2024-01-14 NOTE — ED Provider Notes (Signed)
 [place] EMERGENCY DEPARTMENT AT [facility] Provider Note   CSN: [id number] Arrival date & time: 01/14/24  8096     Patient presents with: No chief complaint on file.   Donna Barron is a 83 y.o. female.   HPI      Donna Barron is a 83 y.o. female with past medical history of seizures, anxiety, hypertension, hypercholesterolemia, dementia, paroxysmal atrial fibrillation anticoagulated on Eliquis  who presents to the Emergency Department from the [facility] for evaluation of possible stroke.  Last known well approximately 30 minutes ago, per EMS report.  Patient was eating dinner with family and suddenly stopped responding.  Family reported that they did not witness seizure-like activity.  2005 patient's daughter now at bedside and able to provide additional history.  She states last known well was approximately 1-1:30 this afternoon.  Had just finished eating lunch with her boyfriend when she began to have a blank stare and would not respond.  Her boyfriend was with her at the time and no reported seizure activity.  Daughter states that she had similar symptoms with previous UTI, but this episode seems slightly different as her mother would not respond to her or show facial expressions.  Pt was admitted here in June for weakness and UTI   Prior to Admission medications   Medication Sig Start Date End Date Taking? Authorizing Provider  apixaban  (ELIQUIS ) 5 MG TABS tablet Take 1 tablet (5 mg total) by mouth 2 (two) times daily. 11/17/23   Donna Barnie RAMAN, NP  bisacodyl  (DULCOLAX) 5 MG EC tablet Take 1 tablet (5 mg total) by mouth daily as needed for moderate constipation. 10/17/23 10/16/24  Donna Bracken D, DO  divalproex  (DEPAKOTE  ER) 250 MG 24 hr tablet Take 2 tablets every night 11/17/23   Donna Barnie RAMAN, NP  escitalopram  (LEXAPRO ) 20 MG tablet Take 1 tablet (20 mg total) by mouth at bedtime. 11/17/23   Donna Barnie RAMAN, NP  feeding supplement (ENSURE PLUS HIGH PROTEIN) LIQD  Take 237 mLs by mouth 2 (two) times daily between meals. 12/16/23   Donna, Donna D, DO  folic acid  (FOLVITE ) 1 MG tablet Take 1 tablet (1 mg total) by mouth daily. 12/16/23 01/15/24  Donna, Donna D, DO  furosemide  (LASIX ) 40 MG tablet Take 1 tablet (40 mg total) by mouth daily. 11/17/23   Donna Barnie RAMAN, NP  melatonin 5 MG TABS Take 5 mg by mouth at bedtime.    [provider]  metoprolol  succinate (TOPROL -XL) 50 MG 24 hr tablet Take 1.5 tablets (75 mg total) by mouth daily. 11/17/23 05/15/24  Donna Barnie RAMAN, NP  nystatin  cream (MYCOSTATIN ) Apply 1 Application topically 2 (two) times daily as needed (yeast). Patient not taking: Reported on 12/30/2023 11/17/23   Donna Barnie RAMAN, NP  nystatin  powder Apply 1 Application topically 2 (two) times daily as needed.    [provider]  omeprazole (PRILOSEC) 40 MG capsule Take 40 mg by mouth daily.    [provider]  OXcarbazepine  (TRILEPTAL ) 150 MG tablet Take 1 tablet (150 mg total) by mouth 2 (two) times daily. 11/17/23   Donna Barnie RAMAN, NP  potassium chloride  SA (KLOR-CON  M) 20 MEQ tablet Take 1 tablet (20 mEq total) by mouth daily. 11/17/23   Donna Barnie RAMAN, NP  simvastatin  (ZOCOR ) 40 MG tablet Take 1 tablet (40 mg total) by mouth at bedtime. 11/17/23   Donna Barnie RAMAN, NP  vitamin B-12 (VITAMIN B12) 500 MCG tablet Take 1 tablet (  500 mcg total) by mouth daily. 12/16/23 01/15/24  Donna, Donna D, DO  Vitamin Barron , Ergocalciferol , (DRISDOL ) 1.25 MG (50000 UNIT) CAPS capsule Take 1 capsule (50,000 Units total) by mouth once a week. Patient taking differently: Take 50,000 Units by mouth every Tuesday. 11/17/23   Donna Barnie RAMAN, NP    Allergies: Codeine , Hydrocodone , Avelox [moxifloxacin hcl in nacl], and Oxybutynin    Review of Systems  Unable to perform ROS: Acuity of condition    Updated Vital Signs BP (!) 129/56 (BP Location: Right Arm)   Pulse 75   Resp (!) 23   Ht 5' 2 (1.575 m)   Wt 85 kg   SpO2 95%   BMI 34.26 kg/m    Physical Exam Vitals and nursing note reviewed.  HENT:     Mouth/Throat:     Mouth: Mucous membranes are moist.  Eyes:     Conjunctiva/sclera: Conjunctivae normal.     Pupils: Pupils are equal, round, and reactive to light.  Cardiovascular:     Rate and Rhythm: Normal rate and regular rhythm.     Pulses: Normal pulses.  Pulmonary:     Effort: Pulmonary effort is normal.  Skin:    General: Skin is warm.     Capillary Refill: Capillary refill takes less than 2 seconds.  Neurological:     Mental Status: She is alert.     Comments: Patient awake but will not respond to questions or follow commands. I do not appreciate facial droop or disconjugate gaze.       (all labs ordered are listed, but only abnormal results are displayed) Labs Reviewed  APTT - Abnormal; Notable for the following components:      Result Value   aPTT 23 (*)    All other components within normal limits  CBC - Abnormal; Notable for the following components:   Hemoglobin 15.5 (*)    HCT 47.5 (*)    All other components within normal limits  COMPREHENSIVE METABOLIC PANEL WITH GFR - Abnormal; Notable for the following components:   Sodium 134 (*)    Chloride 94 (*)    Glucose, Bld 134 (*)    Albumin 3.0 (*)    All other components within normal limits  URINALYSIS, ROUTINE W REFLEX MICROSCOPIC - Abnormal; Notable for the following components:   APPearance CLOUDY (*)    Hgb urine dipstick MODERATE (*)    Protein, ur 30 (*)    Leukocytes,Ua LARGE (*)    Bacteria, UA MANY (*)    All other components within normal limits  URINE CULTURE  PROTIME-INR  DIFFERENTIAL  ETHANOL  RAPID URINE DRUG SCREEN, HOSP PERFORMED  PHENYTOIN  LEVEL, FREE AND TOTAL  OXCARBAZEPINE  (TRILEPTAL ), SERUM    EKG: None  Radiology: CT ANGIO HEAD NECK W WO CM W PERF (CODE STROKE) Result Date: 01/14/2024 EXAM: CTA Head and Neck with Perfusion 01/14/2024 08:31:09 PM TECHNIQUE: CTA of the head and neck was performed with the  administration of intravenous contrast. 3D postprocessing with multiplanar reconstructions and MIPs was performed to evaluate the vascular anatomy. Automated exposure control, iterative reconstruction, and/or weight based adjustment of the mA/kV was utilized to reduce the radiation dose to as low as reasonably achievable. COMPARISON: None available CLINICAL HISTORY: Neuro deficit, acute, stroke suspected. Grip strength weakness, unable to talk/slurred speech, unable to raise arms. LKW 30-45 mins. Dr. Cleotilde [phone], Teleneuro, [phone] FINDINGS: CTA NECK: AORTIC ARCH AND ARCH VESSELS: No dissection or arterial injury. No significant stenosis of the brachiocephalic or  subclavian arteries. Mild calcific aortic atherosclerosis. CERVICAL CAROTID ARTERIES: No dissection, arterial injury, or hemodynamically significant stenosis by NASCET criteria. Mild atherosclerotic calcification at the right carotid bifurcation without hemodynamically significant stenosis. CERVICAL VERTEBRAL ARTERIES: No dissection, arterial injury, or significant stenosis. LUNGS AND MEDIASTINUM: Unremarkable. SOFT TISSUES: No acute abnormality. BONES: Multilevel degenerative disc disease. CTA HEAD: ANTERIOR CIRCULATION: No significant stenosis of the internal carotid arteries. Mild atherosclerotic calcification of the cavernous segments of both internal carotid arteries. No significant stenosis of the anterior cerebral arteries. No significant stenosis of the middle cerebral arteries. No aneurysm. POSTERIOR CIRCULATION: No significant stenosis of the posterior cerebral arteries. No significant stenosis of the basilar artery. No significant stenosis of the vertebral arteries. No aneurysm. OTHER: No dural venous sinus thrombosis on this non-dedicated study. CT PERFUSION: EXAM QUALITY: Exam quality is adequate with diagnostic perfusion maps. No significant motion artifact. Appropriate arterial inflow and venous outflow curves. CORE INFARCT  (CBF<30% volume): 0 mL TOTAL HYPOPERFUSION (Tmax>6s volume): 0 mL PENUMBRA: Mismatch volume: 0 mL Mismatch ratio: Not applicable Location: Not applicable IMPRESSION: 1. No acute large vessel occlusion. 2. No hemodynamically significant stenosis or aneurysm in the head or neck vessels. 3. No evidence of ischemia by CT brain perfusion. Electronically signed by: Franky Stanford MD 01/14/2024 08:39 PM EDT RP Workstation: [id number]   CT HEAD CODE STROKE WO CONTRAST Result Date: 01/14/2024 EXAM: CT HEAD WITHOUT CONTRAST 01/14/2024 07:16:33 PM TECHNIQUE: CT of the head was performed without the administration of intravenous contrast. Automated exposure control, iterative reconstruction, and/or weight based adjustment of the mA/kV was utilized to reduce the radiation dose to as low as reasonably achievable. COMPARISON: 10/13/2023 CLINICAL HISTORY: Neuro deficit, acute, stroke suspected. Grip strength weakness, unable to talk/slurred speech, unable to raise arms. LKW 30-45 mins. Dr. Cleotilde [phone], Teleneuro, [phone] FINDINGS: BRAIN AND VENTRICLES: No acute hemorrhage. Gray-white differentiation is preserved. No hydrocephalus. No extra-axial collection. No mass effect or midline shift. Hypoattenuation in the cerebral white matter, most likely representing chronic small vessel disease. Mild volume loss. ORBITS: No acute abnormality. SINUSES: No acute abnormality. SOFT TISSUES AND SKULL: No acute soft tissue abnormality. No skull fracture. IMPRESSION: 1. No acute intracranial abnormality. 2. Hypoattenuation in the cerebral white matter, most likely representing chronic small vessel disease. 3. Mild volume loss. 4. ASPECTS is 10 5. Findings discussed with Dr. Cleotilde at 07:27 pm Electronically signed by: Franky Stanford MD 01/14/2024 07:28 PM EDT RP Workstation: [id number]     Procedures    CRITICAL CARE Performed by: Rhone Ozaki Total critical care time: 45 minutes Critical care time was exclusive of  separately billable procedures and treating other patients. Critical care was necessary to treat or prevent imminent or life-threatening deterioration. Critical care was time spent personally by me on the following activities: development of treatment plan with patient and/or surrogate as well as nursing, discussions with consultants, evaluation of patient's response to treatment, examination of patient, obtaining history from patient or surrogate, ordering and performing treatments and interventions, ordering and review of laboratory studies, ordering and review of radiographic studies, pulse oximetry and re-evaluation of patient's condition.  Medications Ordered in the ED - No data to display                                  Medical Decision Making Sent here from [facility] for evaluation of hospital stroke versus altered mental status.  Initial report by EMS was last known well  approximately 30 minutes prior to arrival, but patient's daughter now at bedside and provides additional history.  Last known well was approximately 1-1 30 today and was noticed to have sudden onset of blank stare and not following commands.    Code stoke was activated.     Daughter now at bedside endorses similar symptoms previously with UTIs.  Patient was with her boyfriend when symptoms began and there was no witnessed seizure-like activity.  Patient does take Trileptal  and Depakote  she is also anticoagulated on Eliquis   Differential includes but not limited to stroke, TIA, acute encephalopathy  Amount and/or Complexity of Data Reviewed Labs: ordered.    Details: Labs no evidence of leukocytosis, UDS negative, urinalysis shows cloudy urine with large leukocytes 21-50 red cells and greater than 50 white cells with many bacteria.  Urine cultures pending Radiology: ordered.    Details: CT head stroke protocol negative for acute intracranial abnormality.  CT angio head neck with perfusion study no evidence of LVO,  no evidence of ischemia by CT brain perfusion ECG/medicine tests: ordered.    Details: EKG shows sinus rhythm Discussion of management or test interpretation with external provider(s): Teleneurology consult was initiated, I spoke with Dr. Lorre who recommended to hold thrombolytics and Eliquis  for now, will need EEG.  Unfortunately EEG is unavailable on weekends here, patient will need to go to [facility]  Further workup does show UTI, Rocephin  given here urine culture pending I have discussed with [facility] hospitalist, Dr. Barbra who agrees to admit with plan to transfer to [facility] for EEG  Risk Prescription drug management. Decision regarding hospitalization.        Final diagnoses:  Aphasia    ED Discharge Orders     None          Herlinda Madelin RIGGERS 01/14/24 2144    Cleotilde Rogue, MD 01/16/24 [phone]

## 2024-01-14 NOTE — ED Triage Notes (Signed)
 Patient from [facility] for Stroke Alert. LKN approximately ago. Patient was eating with family within the last hour and was normal; patient now not responding to commands. Patient has a history of seizures but family reports how she is now is not her normal postictal state. Upon arrival to ER, patient is alert, not following commands; BS 186. Baseline is A&O, independent with ADLs; on eliquis . ED provider at bedside and cleared for CT

## 2024-01-14 NOTE — ED Notes (Signed)
 Per daughter at bedside, patient was last seen normal around 1:30 at lunch by patient's boyfriend. Symptoms were discovered around 1830. Daughter also reports this is how patient is when she has UTI; was recently admitted for UTI

## 2024-01-14 NOTE — ED Notes (Signed)
 Per family at bedside, patient is at [facility] for OT and PT following her previous UTI. Patient is 25% assist

## 2024-01-14 NOTE — H&P (Signed)
 History and Physical    PatientBETHA DELLIE Barron FMW:[id number] DOB: [birth year] DOA: 01/14/2024 DOS: the patient was seen and examined on 01/14/2024 PCP: [name], NP  Patient coming from: Home  Chief Complaint:  Chief Complaint  Patient presents with   Code Stroke   HPI: Donna Barron is a 83 y.o. female with medical history significant of dementia, paroxysmal atrial fibrillation on Eliquis , hypertension, GERD, seizure disorder on Trileptal  and Depakote , recurrent UTIs.  Patient has been admitted 3 times in the last couple of months due to acute encephalopathy and recurrent UTIs.  Today, she was at lunch and she stopped responding.  She was brought to the hospital via EMS as a code stroke.  She has remained relatively unresponsive although alert and awake.  No fevers, chills, nausea, vomiting.  Neurology was consulted as part of code stroke.  She had a CT and a CT angiogram of head and neck with perfusion.  All of these were without concern.  She did have a UA that showed a UTI.  Previously, she does get encephalopathy with her UTIs, however this presents more as confusion rather than nonresponsiveness.  Review of Systems: As mentioned in the history of present illness. All other systems reviewed and are negative. Past Medical History:  Diagnosis Date   Anxiety    Cataract    Colon polyps    Dementia (HCC)    Depression    Dysrhythmia    heart palipations   Gallstones    GERD (gastroesophageal reflux disease)    HTN (hypertension)    Hypercholesteremia    PONV (postoperative nausea and vomiting)    Past Surgical History:  Procedure Laterality Date   APPENDECTOMY     ARTHROSCOPIC REPAIR ACL  05/2008   Left knee   CARPAL TUNNEL RELEASE Right 07/14/2017   Procedure: CARPAL TUNNEL RELEASE;  Surgeon: Margrette Taft BRAVO, MD;  Location: [facility];  Service: Orthopedics;  Laterality: Right;   CATARACT EXTRACTION, BILATERAL  2020   CHOLECYSTECTOMY     DILATION AND CURETTAGE OF  UTERUS     DORSAL COMPARTMENT RELEASE Right 07/14/2017   Procedure: RELEASE DORSAL COMPARTMENT (DEQUERVAIN);  Surgeon: Margrette Taft BRAVO, MD;  Location: [facility];  Service: Orthopedics;  Laterality: Right;   HIATAL HERNIA REPAIR N/A 03/24/2020   Procedure: LAPAROSCOPIC REPAIR OF HIATAL HERNIA, nissen fundoplication;  Surgeon: Gladis Cough, MD;  Location: [facility];  Service: General;  Laterality: N/A;   HYSTEROSCOPY WITH D & C N/A 12/25/2013   Procedure: DILATATION AND CURETTAGE /HYSTEROSCOPY;  Surgeon: Peggye Gull, MD;  Location: [facility];  Service: Gynecology;  Laterality: N/A;   SHOULDER OPEN ROTATOR CUFF REPAIR Left 06/15/2018   Procedure: ROTATOR CUFF REPAIR SHOULDER OPEN WITH ACROMIOPLASTY;  Surgeon: Margrette Taft BRAVO, MD;  Location: [facility];  Service: Orthopedics;  Laterality: Left;   TUBAL LIGATION     Social History:  reports that she has never smoked. She has never used smokeless tobacco. She reports that she does not currently use alcohol. She reports that she does not use drugs.  Allergies  Allergen Reactions   Codeine  Swelling    Swelling of lips   Hydrocodone  Swelling    Lip swelling   Avelox [Moxifloxacin Hcl In Nacl] Other (See Comments)    Insomnia    Oxybutynin Palpitations and Other (See Comments)    Tongue felt coated     Family History  Problem Relation Age of Onset   Heart failure Mother 53   Lung cancer  Father 39   Breast cancer Sister    Diabetes Sister    Colon cancer Brother    Colon polyps Son    Colon polyps Daughter     Prior to Admission medications   Medication Sig Start Date End Date Taking? Authorizing Provider  apixaban  (ELIQUIS ) 5 MG TABS tablet Take 1 tablet (5 mg total) by mouth 2 (two) times daily. 11/17/23   [name], NP  bisacodyl  (DULCOLAX) 5 MG EC tablet Take 1 tablet (5 mg total) by mouth daily as needed for moderate constipation. 10/17/23 10/16/24  Maree, Pratik D, DO  divalproex  (DEPAKOTE  ER) 250 MG 24 hr tablet Take 2 tablets every  night 11/17/23   [name], NP  escitalopram  (LEXAPRO ) 20 MG tablet Take 1 tablet (20 mg total) by mouth at bedtime. 11/17/23   [name], NP  feeding supplement (ENSURE PLUS HIGH PROTEIN) LIQD Take 237 mLs by mouth 2 (two) times daily between meals. 12/16/23   Maree, Pratik D, DO  folic acid  (FOLVITE ) 1 MG tablet Take 1 tablet (1 mg total) by mouth daily. 12/16/23 01/15/24  Maree, Pratik D, DO  furosemide  (LASIX ) 40 MG tablet Take 1 tablet (40 mg total) by mouth daily. 11/17/23   [name], NP  melatonin 5 MG TABS Take 5 mg by mouth at bedtime.    [provider]  metoprolol  succinate (TOPROL -XL) 50 MG 24 hr tablet Take 1.5 tablets (75 mg total) by mouth daily. 11/17/23 05/15/24  [name], NP  nystatin  cream (MYCOSTATIN ) Apply 1 Application topically 2 (two) times daily as needed (yeast). Patient not taking: Reported on 12/30/2023 11/17/23   [name], NP  nystatin  powder Apply 1 Application topically 2 (two) times daily as needed.    [provider]  omeprazole (PRILOSEC) 40 MG capsule Take 40 mg by mouth daily.    [provider]  OXcarbazepine  (TRILEPTAL ) 150 MG tablet Take 1 tablet (150 mg total) by mouth 2 (two) times daily. 11/17/23   [name], NP  potassium chloride  SA (KLOR-CON  M) 20 MEQ tablet Take 1 tablet (20 mEq total) by mouth daily. 11/17/23   [name], NP  simvastatin  (ZOCOR ) 40 MG tablet Take 1 tablet (40 mg total) by mouth at bedtime. 11/17/23   [name], NP  vitamin B-12 (VITAMIN B12) 500 MCG tablet Take 1 tablet (500 mcg total) by mouth daily. 12/16/23 01/15/24  Maree, Pratik D, DO  Vitamin D , Ergocalciferol , (DRISDOL ) 1.25 MG (50000 UNIT) CAPS capsule Take 1 capsule (50,000 Units total) by mouth once a week. Patient taking differently: Take 50,000 Units by mouth every Tuesday. 11/17/23   [name], NP    Physical Exam: Vitals:   01/14/24 2045 01/14/24 2100 01/14/24 2115 01/14/24 2130  BP: (!) 105/49 (!) 114/55 (!)  109/57 115/63  Pulse: 73 73 72 74  Resp: 19 18 18 16   Temp:      TempSrc:      SpO2: 93% 94% 95% 94%  Weight:      Height:       General: Elderly female. Awake and alert.  Patient otherwise nonresponsive No acute cardiopulmonary distress.  HEENT: Normocephalic atraumatic.  Right and left ears normal in appearance.  Pupils equal, round, reactive to light. Extraocular muscles are intact. Sclerae anicteric and noninjected.  Moist mucosal membranes. No mucosal lesions.  Neck: Neck supple without lymphadenopathy. No carotid bruits. No masses palpated.  Cardiovascular: Regular rate with normal S1-S2  sounds. No murmurs, rubs, gallops auscultated. No JVD.  Respiratory: Good respiratory effort with no wheezes, rales, rhonchi. Lungs clear to auscultation bilaterally.  No accessory muscle use. Abdomen: Soft, nontender, nondistended. Active bowel sounds. No masses or hepatosplenomegaly  Skin: No rashes, lesions, or ulcerations.  Dry, warm to touch. 2+ dorsalis pedis and radial pulses. Musculoskeletal: No calf or leg pain. All major joints not erythematous nontender.  No upper or lower joint deformation.  Good ROM.  No contractures  Psychiatric: Unable to assess. Neurologic: Unable to assess  Data Reviewed: Labs and imaging reviewed by me  Assessment and Plan: No notes have been filed under this hospital service. 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  This appears little more extensive medical normal UTI encephalopathy. Will get MRI EEG Depakote  level pending Urine cultures pending Echo, lipids, A1c PT/OT/speech unable Will allow permissive hypertension  until stroke adequately ruled out Hold anticoagulation until stroke ruled out UTI Continue Rocephin  CBC and CMP in the morning As she has been getting recurrent UTIs, she should have urology or urogynecology follow-up outpatient with consideration of prophylaxis to prevent recurrence.  This may include d-mannose, vaginal estrogen, and/or nightly Macrobid  Seizure disorder Continue seizure prophylaxis EEG recommended by neurology Hypertension  Antihypertensives on hold paroxysmal atrial fibrillation Hold Eliquis  Rate controlled Dementia   Advance Care Planning:   Code Status: Full Code confirmed by patient's daughter  Consults: Neurology  Family Communication: Patient's daughter present during interview and exam.  History is obtained by patient's daughter  Severity of Illness: The appropriate patient status for this patient is INPATIENT. Inpatient status is judged to be reasonable and necessary in order to provide the required intensity of service to ensure the patient's safety. The patient's presenting symptoms, physical exam findings, and initial radiographic and laboratory data in the context of their chronic comorbidities is felt to place them at high risk for further clinical deterioration. Furthermore, it is not anticipated that the patient will be medically stable for discharge from the hospital within 2 midnights of admission.   * I certify that at the point of admission it is my clinical judgment that the patient will require inpatient hospital care spanning beyond 2 midnights from the point of admission due to high intensity of service, high risk for further deterioration and high frequency of surveillance required.*  Author: Rohaan Durnil J Hazelgrace Bonham, DO 01/14/2024 9:52 PM  For on call review [email or web address].

## 2024-01-14 NOTE — Consult Note (Signed)
 [facility] Consult Services   Patient Name:   Donna Barron, Donna Barron Date of Birth:   09-08-1940 Identification Number:   MRN - [id number] Date of Service:   01/14/2024 19:16:42  Diagnosis:       G93.49 - Encephalopathy Multifactorial  Impression:      This is an 83 year old woman with a history of seizures on Trileptal  150 mg twice daily and Depakote  500 mg extended release, dementia, hyperlipidemia, A-fib on Eliquis  who apparently became weak all over and stopped responding friend of her family at her nursing home. Lysis is contraindicated by Eliquis . The differential includes encephalopathy due to medical condition, nonconvulsive seizure or prolonged postictal state and I think less likely stroke.   Recommend:  1. CT angiogram of head and neck and perfusion are without acute concerns  2. Routine blood and urine laboratories for potential medical encephalopathy  3. If there is no obvious cause and the above testing to suggest stroke or metabolic or toxic encephalopathy I think she should be transferred for stat EEG  4. We can check a Depakote  level  5. I would hold her Eliquis  for now until we have ruled out stroke  6. If she needs to complete stroke workup depending on testing above MRI of the brain without contrast, transthoracic echo, lipids A1c and telemetry        DVT Prophylaxis    ------------------------------------------------------------------------------  Advanced Imaging: CTA Head and Neck Completed.  CTP Completed.  LVO:No  Patient is not a candidate for NIR   Metrics: Last Known Well: 01/14/2024 19:00:00 Dispatch Time: 01/14/2024 19:16:41 Arrival Time: 01/14/2024 19:03:00 Initial Response Time: 01/14/2024 19:24:38 Symptoms: altered mental status . Initial patient interaction: 01/14/2024 19:29:30 NIHSS Assessment Completed: 01/14/2024 19:33:47 Patient is not a candidate for Thrombolytic. Thrombolytic Medical Decision: 01/14/2024  19:33:48 Patient was not deemed candidate for Thrombolytic because of following reasons: Use of NOAC in last 48 hrs. .  CT Head: I personally reviewed all the CT images that were available to me and it showed: no acute pathology  Primary Provider Notified of Diagnostic Impression and Management Plan on: 01/14/2024 19:44:27    ------------------------------------------------------------------------------  History of Present Illness: Patient is a 83 year old Female.  Patient was brought by EMS for symptoms of altered mental status . This is an 83 year old woman with hypertension, GERD, hyperlipidemia, depression, AFIB on Eliquis  eating with family then became poorly responsive and came in with EMS from nursing home to the emergency department. Patient is alert but non communicative. Staff indicates seizure history but this is different than typical post ictal state per family.    Past Medical History:      Hypertension      Hyperlipidemia      Seizures       Dementia/MCI  Medications:  Anticoagulant use:  Yes Eliquis  No Antiplatelet use Reviewed EMR for current medications  Allergies:  Reviewed  Social History: Drug Use: No  Family History:  There is no family history of premature cerebrovascular disease pertinent to this consultation  ROS : 14 Points Review of Systems was performed and was negative except mentioned in HPI.  Past Surgical History: There Is No Surgical History Contributory To Today's Visit     Examination: BP(129/56), Pulse(86), Blood Glucose(186) 1A: Level of Consciousness - Alert; keenly responsive + 0 1B: Ask Month and Age - Could Not Answer Either Question Correctly + 2 1C: Blink Eyes & Squeeze Hands - Performs 0 Tasks + 2 2: Test Horizontal Extraocular Movements -  Normal + 0 3: Test Visual Fields - No Visual Loss + 0 4: Test Facial Palsy (Use Grimace if Obtunded) - Normal symmetry + 0 5A: Test Left Arm Motor Drift - Some Effort Against  Gravity + 2 5B: Test Right Arm Motor Drift - Drift, hits bed + 2 6A: Test Left Leg Motor Drift - No Effort Against Gravity + 3 6B: Test Right Leg Motor Drift - No Effort Against Gravity + 3 7: Test Limb Ataxia (FNF/Heel-Shin) - No Ataxia + 0 8: Test Sensation - Normal; No sensory loss + 0 9: Test Language/Aphasia - Severe Aphasia: Fragmentary Expression, Inference Needed, Cannot Identify Materials + 2 10: Test Dysarthria - Normal + 0 11: Test Extinction/Inattention - No abnormality + 0  NIHSS Score: 16  NIHSS Free Text : said ouch to noxious stimuli.  Pre-Morbid Modified Rankin Scale: Unable to assess  Spoke with : ED PA I reviewed the available imaging via Rapid and initiated discussion with the primary provider  This consult was conducted in real time using interactive audio and Immunologist. Patient was informed of the technology being used for this visit and agreed to proceed. Patient located in hospital and provider located at home/office setting.   Patient is being evaluated for possible acute neurologic impairment and high probability of imminent or life-threatening deterioration. I spent total of 30 minutes providing care to this patient, including time for face to face visit via telemedicine, review of medical records, imaging studies and discussion of findings with providers, the patient and/or family.    Dr Vicenta Hunter   [facility] For Inpatient follow-up with [facility] physician please call [facility] at [phone]. As we are not an outpatient service for any post hospital discharge needs please contact the hospital for assistance. If you have any questions for the [facility] physicians or need to reconsult for clinical or diagnostic changes please contact us  via [facility] at [phone].   Signature : Vicenta Hunter

## 2024-01-14 NOTE — ED Notes (Signed)
 Patient transported to CT

## 2024-01-15 ENCOUNTER — Inpatient Hospital Stay (HOSPITAL_COMMUNITY)

## 2024-01-15 DIAGNOSIS — G9340 Encephalopathy, unspecified: Secondary | ICD-10-CM

## 2024-01-15 DIAGNOSIS — I10 Essential (primary) hypertension: Secondary | ICD-10-CM | POA: Diagnosis not present

## 2024-01-15 LAB — CBC
HCT: 45 % (ref 36.0–46.0)
HCT: 46.4 % — ABNORMAL HIGH (ref 36.0–46.0)
Hemoglobin: 15 g/dL (ref 12.0–15.0)
Hemoglobin: 15.3 g/dL — ABNORMAL HIGH (ref 12.0–15.0)
MCH: 31.4 pg (ref 26.0–34.0)
MCH: 31.3 pg (ref 26.0–34.0)
MCHC: 33.3 g/dL (ref 30.0–36.0)
MCHC: 33 g/dL (ref 30.0–36.0)
MCV: 94.1 fL (ref 80.0–100.0)
MCV: 94.9 fL (ref 80.0–100.0)
Platelets: 253 K/uL (ref 150–400)
Platelets: 228 K/uL (ref 150–400)
RBC: 4.78 MIL/uL (ref 3.87–5.11)
RBC: 4.89 MIL/uL (ref 3.87–5.11)
RDW: 13.7 % (ref 11.5–15.5)
RDW: 13.8 % (ref 11.5–15.5)
WBC: 8.5 K/uL (ref 4.0–10.5)
WBC: 8 K/uL (ref 4.0–10.5)
nRBC: 0 % (ref 0.0–0.2)
nRBC: 0 % (ref 0.0–0.2)

## 2024-01-15 LAB — BASIC METABOLIC PANEL WITH GFR
Anion gap: 12 (ref 5–15)
BUN: 14 mg/dL (ref 8–23)
CO2: 30 mmol/L (ref 22–32)
Calcium: 9.4 mg/dL (ref 8.9–10.3)
Chloride: 98 mmol/L (ref 98–111)
Creatinine, Ser: 0.71 mg/dL (ref 0.44–1.00)
GFR, Estimated: >60 mL/min (ref >60)
Glucose, Bld: 96 mg/dL (ref 70–99)
Potassium: 3.5 mmol/L (ref 3.5–5.1)
Sodium: 140 mmol/L (ref 135–145)

## 2024-01-15 LAB — LIPID PANEL
Cholesterol: 151 mg/dL (ref 0–200)
HDL: 43 mg/dL (ref >40)
LDL Cholesterol: 79 mg/dL (ref 0–99)
Total CHOL/HDL Ratio: 3.5 ratio
Triglycerides: 146 mg/dL (ref <150)
VLDL: 29 mg/dL (ref 0–40)

## 2024-01-15 LAB — COMPREHENSIVE METABOLIC PANEL WITH GFR
ALT: 13 U/L (ref 0–44)
AST: 19 U/L (ref 15–41)
Albumin: 3.1 g/dL — ABNORMAL LOW (ref 3.5–5.0)
Alkaline Phosphatase: 69 U/L (ref 38–126)
Anion gap: 13 (ref 5–15)
BUN: 14 mg/dL (ref 8–23)
CO2: 29 mmol/L (ref 22–32)
Calcium: 9.5 mg/dL (ref 8.9–10.3)
Chloride: 97 mmol/L — ABNORMAL LOW (ref 98–111)
Creatinine, Ser: 0.56 mg/dL (ref 0.44–1.00)
GFR, Estimated: >60 mL/min (ref >60)
Glucose, Bld: 93 mg/dL (ref 70–99)
Potassium: 3.5 mmol/L (ref 3.5–5.1)
Sodium: 139 mmol/L (ref 135–145)
Total Bilirubin: 0.8 mg/dL (ref 0.0–1.2)
Total Protein: 6.5 g/dL (ref 6.5–8.1)

## 2024-01-15 LAB — ECHOCARDIOGRAM COMPLETE
Area-P 1/2: 2.38 cm2
Height: 62 in
Weight: 2996.8 [oz_av]

## 2024-01-15 LAB — MRSA NEXT GEN BY PCR, NASAL: MRSA by PCR Next Gen: NOT DETECTED

## 2024-01-15 LAB — MAGNESIUM: Magnesium: 1.9 mg/dL (ref 1.7–2.4)

## 2024-01-15 MED ORDER — SODIUM CHLORIDE 0.9 % IV SOLN: 2.0000 g | INTRAVENOUS | Status: DC

## 2024-01-15 MED ORDER — SODIUM CHLORIDE 0.9 % IV SOLN: INTRAVENOUS | Status: DC

## 2024-01-15 MED ORDER — OXCARBAZEPINE 150 MG PO TABS
150.0000 mg | ORAL_TABLET | Freq: Two times a day (BID) | ORAL | Status: DC
  Administered 2024-01-15: 150 mg via ORAL
  Filled 2024-01-15 (×4): qty 1

## 2024-01-15 MED ORDER — ACETAMINOPHEN 650 MG RE SUPP: 650.0000 mg | RECTAL | Status: DC | PRN

## 2024-01-15 MED ORDER — SIMVASTATIN 20 MG PO TABS
40.0000 mg | ORAL_TABLET | Freq: Every day | ORAL | Status: DC
  Administered 2024-01-15 – 2024-01-16 (×2): 40 mg via ORAL
  Filled 2024-01-15 (×2): qty 2

## 2024-01-15 MED ORDER — DIVALPROEX SODIUM ER 500 MG PO TB24
500.0000 mg | ORAL_TABLET | Freq: Every day | ORAL | Status: DC
  Administered 2024-01-15 – 2024-01-16 (×2): 500 mg via ORAL
  Filled 2024-01-15 (×2): qty 1

## 2024-01-15 MED ORDER — ASPIRIN 325 MG PO TABS
325.0000 mg | ORAL_TABLET | Freq: Every day | ORAL | Status: DC
  Administered 2024-01-15: 325 mg via ORAL
  Filled 2024-01-15: qty 1

## 2024-01-15 MED ORDER — SODIUM CHLORIDE 0.9 % IV SOLN
2.0000 g | INTRAVENOUS | Status: DC
  Administered 2024-01-15 – 2024-01-16 (×2): 2 g via INTRAVENOUS
  Filled 2024-01-15 (×2): qty 20

## 2024-01-15 MED ORDER — STROKE: EARLY STAGES OF RECOVERY BOOK: Freq: Once | Status: DC

## 2024-01-15 MED ORDER — ASPIRIN 300 MG RE SUPP: 300.0000 mg | Freq: Every day | RECTAL | Status: DC

## 2024-01-15 MED ORDER — APIXABAN 5 MG PO TABS
5.0000 mg | ORAL_TABLET | Freq: Two times a day (BID) | ORAL | Status: DC
  Administered 2024-01-15 – 2024-01-17 (×4): 5 mg via ORAL
  Filled 2024-01-15 (×4): qty 1

## 2024-01-15 MED ORDER — ACETAMINOPHEN 325 MG PO TABS: 650.0000 mg | ORAL_TABLET | ORAL | Status: DC | PRN

## 2024-01-15 MED ORDER — ENOXAPARIN SODIUM 40 MG/0.4ML IJ SOSY
40.0000 mg | PREFILLED_SYRINGE | Freq: Every day | INTRAMUSCULAR | Status: DC
  Administered 2024-01-15: 40 mg via SUBCUTANEOUS
  Filled 2024-01-15: qty 0.4

## 2024-01-15 MED ORDER — SENNOSIDES-DOCUSATE SODIUM 8.6-50 MG PO TABS: 1.0000 | ORAL_TABLET | Freq: Every evening | ORAL | Status: DC | PRN

## 2024-01-15 MED ORDER — VITAMIN B-12 100 MCG PO TABS
500.0000 ug | ORAL_TABLET | Freq: Every day | ORAL | Status: DC
  Administered 2024-01-16 – 2024-01-17 (×2): 500 ug via ORAL
  Filled 2024-01-15 (×2): qty 5

## 2024-01-15 MED ORDER — ACETAMINOPHEN 160 MG/5ML PO SOLN: 650.0000 mg | ORAL | Status: DC | PRN

## 2024-01-15 NOTE — Progress Notes (Signed)
 PROGRESS NOTE    Donna Barron  FMW:[id number] DOB: [birth year] DOA: 01/14/2024 PCP: [name], NP   Brief Narrative:    Donna Barron is a 83 y.o. female with medical history significant of dementia, paroxysmal atrial fibrillation on Eliquis , hypertension, GERD, seizure disorder on Trileptal  and Depakote , recurrent UTIs.  Patient has been admitted 3 times in the last couple of months due to acute encephalopathy and recurrent UTIs.  Today, she was at lunch and she stopped responding.  She was brought to the hospital via EMS as a code stroke.  Patient was admitted with acute metabolic encephalopathy likely in the setting of UTI.  She was initially planned to go to [facility] for stat EEG, but with improvement in mentation and no significant findings on MRI, plan is for close monitoring on IV antibiotics with EEG on 8/4.  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 Zollie and due to improvement in mentation, plan for EEG on 8/4 instead of stat - Continue antiepileptics -As she has been getting recurrent UTIs, she should have urology or urogynecology follow-up outpatient with consideration of prophylaxis to prevent recurrence.  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  Consultants:  Discussed with neurology Dr. Zollie  Procedures:  None  Antimicrobials:  Anti-infectives (From admission, onward)    Start     Dose/Rate Route Frequency Ordered Stop   01/15/24 2100  cefTRIAXone  (ROCEPHIN ) 2 g in sodium chloride  0.9 % 100 mL IVPB  Status:  Discontinued        2 g 200 mL/hr over 30 Minutes Intravenous Every 24 hours 01/15/24 0657 01/15/24 0945   01/15/24 2000  cefTRIAXone  (ROCEPHIN ) 2 g in sodium chloride  0.9 % 100 mL IVPB        2 g 200 mL/hr over 30 Minutes Intravenous Every 24 hours 01/15/24 0942     01/15/24 2000  cefTRIAXone  (ROCEPHIN ) 2 g in sodium chloride  0.9 % 100 mL IVPB  Status:  Discontinued        2 g 200 mL/hr over 30 Minutes Intravenous Every 24 hours 01/15/24 0942 01/15/24 0945   01/14/24 2100  cefTRIAXone  (ROCEPHIN ) 2 g in sodium chloride  0.9 % 100 mL IVPB        2 g 200 mL/hr over 30 Minutes Intravenous  Once 01/14/24 2058 01/14/24 2157       Subjective: Patient seen and evaluated today and appears quite lethargic, but arouses to verbal and physical stimuli and answers questions appropriately.  Objective: Vitals:   01/15/24 0630 01/15/24 0700 01/15/24 0730 01/15/24 9047  BP: 97/84 113/68 (!) 102/50 111/89  Pulse:   71 67  Resp: (!) 27 18 16 20   Temp:    98.5 F (36.9 C)  TempSrc:    Oral  SpO2:   93% 96%  Weight:      Height:       No intake or output data in the 24 hours ending 01/15/24 1139 Filed Weights   01/14/24 1935  Weight: 85 kg    Examination:  General exam: Appears somnolent and lethargic, obese Respiratory system: Clear to auscultation. Respiratory effort normal. Cardiovascular system: S1 & S2 heard, RRR.   Gastrointestinal system: Abdomen is soft Central nervous system: Some alert/lethargic Extremities: No edema Skin: No significant lesions noted Psychiatry: Flat affect.    Data Reviewed: I have personally reviewed following labs and imaging studies  CBC: Recent Labs  Lab 01/14/24 1931 01/15/24 0715 01/15/24 1007  WBC 8.6 8.5 8.0  NEUTROABS 6.1  --   --   HGB 15.5* 15.0 15.3*  HCT 47.5* 45.0 46.4*  MCV 95.8 94.1 94.9  PLT 209 253 228   Basic Metabolic Panel: Recent Labs  Lab 01/14/24 1931 01/15/24 0715 01/15/24 1007  NA 134* 140 139  K 3.7 3.5 3.5  CL 94* 98 97*  CO2 28 30 29   GLUCOSE 134* 96 93  BUN 15 14 14   CREATININE 0.77 0.71 0.56  CALCIUM 9.2 9.4 9.5  MG  --  1.9  --    GFR: Estimated Creatinine Clearance: 54.9 mL/min (by C-G formula based on SCr of 0.56 mg/dL). Liver Function Tests: Recent Labs  Lab 01/14/24 1931 01/15/24 1007  AST 17 19  ALT 12 13  ALKPHOS 71 69  BILITOT 0.4 0.8  PROT 6.7 6.5  ALBUMIN 3.0* 3.1*   No results for input(s): LIPASE, AMYLASE in the last 168 hours. No results for input(s): AMMONIA in the last 168 hours. Coagulation Profile: Recent Labs  Lab 01/14/24 1931  INR 1.1   Cardiac Enzymes: No results for input(s): CKTOTAL, CKMB, CKMBINDEX, TROPONINI in the last 168 hours. BNP (last 3 results) No results for input(s): PROBNP in the last 8760 hours. HbA1C: No results for input(s): HGBA1C in the last 72 hours. CBG: No results for input(s): GLUCAP in the last 168 hours. Lipid Profile: Recent Labs    01/15/24 1007  CHOL 151  HDL 43  LDLCALC 79  TRIG 146  CHOLHDL 3.5   Thyroid  Function Tests: No results for input(s): TSH, T4TOTAL, FREET4, T3FREE, THYROIDAB in the last 72 hours. Anemia Panel: No results for input(s): VITAMINB12, FOLATE, FERRITIN, TIBC, IRON, RETICCTPCT in the last 72 hours. Sepsis Labs: No results for input(s): PROCALCITON, LATICACIDVEN in the last  168 hours.  No results found for this or any previous visit (from the past 240 hours).       Radiology Studies: MR BRAIN WO CONTRAST Result Date: 01/15/2024 EXAM: MRI BRAIN WITHOUT CONTRAST 01/15/2024 08:11:29 AM TECHNIQUE: Multiplanar multisequence MRI of the head/brain was performed without the administration of intravenous contrast. COMPARISON: CT of the head dated 01/14/2024, CT angiogram of the head dated 01/14/2024, and MRI of the head dated 05/12/2001. CLINICAL HISTORY: Neuro deficit, acute, stroke suspected. FINDINGS: BRAIN AND VENTRICLES: No acute infarct. No intracranial hemorrhage. No mass. No midline shift. No hydrocephalus. The sella is unremarkable. Normal flow voids. Moderate age-related cerebral volume loss. Moderate periventricular and deep cerebral white matter disease. ORBITS: The patient is status post bilateral lens replacement. SINUSES AND MASTOIDS: No acute abnormality. BONES AND SOFT TISSUES:  Normal marrow signal. No acute soft tissue abnormality. IMPRESSION: 1. No acute intracranial abnormality. 2. Moderate age-related cerebral volume loss. 3. Moderate periventricular and deep cerebral white matter disease. Electronically signed by: evalene coho 01/15/2024 08:15 AM EDT RP Workstation: [id number]   CT ANGIO HEAD NECK W WO CM W PERF (CODE STROKE) Result Date: 01/14/2024 EXAM: CTA Head and Neck with Perfusion 01/14/2024 08:31:09 PM TECHNIQUE: CTA of the head and neck was performed with the administration of intravenous contrast. 3D postprocessing with multiplanar reconstructions and MIPs was performed to evaluate the vascular anatomy. Automated exposure control, iterative reconstruction, and/or weight based adjustment of the mA/kV was utilized to reduce the radiation dose to as low as reasonably achievable. COMPARISON: None available CLINICAL HISTORY: Neuro deficit, acute, stroke suspected. Grip strength weakness, unable to talk/slurred speech, unable to raise arms. LKW 30-45  mins. Dr. Cleotilde [phone], Teleneuro, [phone] FINDINGS: CTA NECK: AORTIC ARCH AND ARCH VESSELS: No dissection or arterial injury. No significant stenosis of the brachiocephalic or subclavian arteries. Mild calcific aortic atherosclerosis. CERVICAL CAROTID ARTERIES: No dissection, arterial injury, or hemodynamically significant stenosis by NASCET criteria. Mild atherosclerotic calcification at the right carotid bifurcation without hemodynamically significant stenosis. CERVICAL VERTEBRAL ARTERIES: No dissection, arterial injury, or significant stenosis. LUNGS AND MEDIASTINUM: Unremarkable. SOFT TISSUES: No acute abnormality. BONES: Multilevel degenerative disc disease. CTA HEAD: ANTERIOR CIRCULATION: No significant stenosis of the internal carotid arteries. Mild atherosclerotic calcification of the cavernous segments of both internal carotid arteries. No significant stenosis of the anterior cerebral arteries. No significant stenosis of the middle cerebral arteries. No aneurysm. POSTERIOR CIRCULATION: No significant stenosis of the posterior cerebral arteries. No significant stenosis of the basilar artery. No significant stenosis of the vertebral arteries. No aneurysm. OTHER: No dural venous sinus thrombosis on this non-dedicated study. CT PERFUSION: EXAM QUALITY: Exam quality is adequate with diagnostic perfusion maps. No significant motion artifact. Appropriate arterial inflow and venous outflow curves. CORE INFARCT (CBF<30% volume): 0 mL TOTAL HYPOPERFUSION (Tmax>6s volume): 0 mL PENUMBRA: Mismatch volume: 0 mL Mismatch ratio: Not applicable Location: Not applicable IMPRESSION: 1. No acute large vessel occlusion. 2. No hemodynamically significant stenosis or aneurysm in the head or neck vessels. 3. No evidence of ischemia by CT brain perfusion. Electronically signed by: Franky Stanford MD 01/14/2024 08:39 PM EDT RP Workstation: [id number]   CT HEAD CODE STROKE WO CONTRAST Result Date: 01/14/2024 EXAM: CT  HEAD WITHOUT CONTRAST 01/14/2024 07:16:33 PM TECHNIQUE: CT of the head was performed without the administration of intravenous contrast. Automated exposure control, iterative reconstruction, and/or weight based adjustment of the mA/kV was utilized to reduce the radiation dose to as low as reasonably achievable. COMPARISON: 10/13/2023 CLINICAL HISTORY: Neuro deficit, acute, stroke suspected. Grip strength weakness, unable to talk/slurred speech, unable to raise arms. LKW 30-45 mins. Dr. Cleotilde [phone], Teleneuro, [phone] FINDINGS: BRAIN AND VENTRICLES: No acute hemorrhage. Gray-white differentiation is preserved. No hydrocephalus. No extra-axial collection. No mass effect or midline shift. Hypoattenuation in the cerebral white matter, most likely representing chronic small vessel disease. Mild volume loss. ORBITS: No acute abnormality. SINUSES: No acute abnormality. SOFT TISSUES AND SKULL: No acute soft tissue abnormality. No skull fracture. IMPRESSION: 1. No acute intracranial abnormality. 2. Hypoattenuation in the cerebral white matter, most likely representing chronic small vessel disease. 3. Mild volume loss. 4. ASPECTS is 10 5. Findings discussed with Dr. Cleotilde at 07:27 pm Electronically signed by: Franky Stanford MD 01/14/2024 07:28 PM EDT RP Workstation: [id number]      LOS: 1 day    Time  spent: 55 minutes    Ural Acree JONETTA Fairly, DO [facility]  If 7PM-7AM, please contact night-coverage [email or web address] 01/15/2024, 11:39 AM

## 2024-01-15 NOTE — ED Notes (Signed)
 Pt able to follow commands regarding upper extremities. Pt refused to move lower extremities at this time when asked. Does move lower extremities when responding to painful stimuli.

## 2024-01-15 NOTE — ED Notes (Signed)
Failed attempt to call report.

## 2024-01-15 NOTE — ED Notes (Signed)
 Call patient's daughter, Shabria Egley, when patient leaves facility

## 2024-01-15 NOTE — ED Notes (Signed)
 Pt lying in bed watching tv. Pt is more vocal. Able to say yes and no at this time. Still unable to follow commands.

## 2024-01-15 NOTE — Progress Notes (Signed)
 Echocardiogram 2D Echocardiogram has been performed.  Damien FALCON Keli Buehner RDCS 01-30-2024, 12:10 PM

## 2024-01-15 NOTE — Plan of Care (Signed)

## 2024-01-16 ENCOUNTER — Other Ambulatory Visit: Payer: Self-pay

## 2024-01-16 ENCOUNTER — Inpatient Hospital Stay (HOSPITAL_COMMUNITY)
Admit: 2024-01-16 | Discharge: 2024-01-16 | Disposition: A | Discharge status: Home / Self Care | Attending: Internal Medicine | Admitting: Internal Medicine

## 2024-01-16 DIAGNOSIS — G9340 Encephalopathy, unspecified: Secondary | ICD-10-CM

## 2024-01-16 LAB — HEMOGLOBIN A1C
Hgb A1c MFr Bld: 5.5 % (ref 4.8–5.6)
Mean Plasma Glucose: 111 mg/dL

## 2024-01-16 MED ORDER — ORAL CARE MOUTH RINSE: 15.0000 mL | OROMUCOSAL | Status: DC | PRN

## 2024-01-16 NOTE — Progress Notes (Signed)
Routine EEG complete. Results pending.

## 2024-01-16 NOTE — Progress Notes (Signed)
 SLP Cancellation Note  Patient Details Name: Donna Barron MRN: [id number] DOB: Jun 15, 1940   Cancelled treatment:       Reason Eval/Treat Not Completed: Other (comment) Pt with baseline cognitive impairment; pt currently with metabolic encephalopathy secondary to UTI and CT and MRI reveal no acute intracranial abnormality. SLE not indicated at this time. Please re-order if acute cognitive change indicates SLE. Our service will sign off, thank you,   Thank you,  Lamar Candy, CCC-SLP [phone]  [name] 01/16/2024, 3:37 PM

## 2024-01-16 NOTE — Procedures (Signed)
 Patient Name: Donna Barron  MRN: [id number]  Epilepsy Attending: Pastor Falling  Referring Physician/Provider: No ref. provider found      Date: 01/16/2024 Duration: 23 minutes   Patient history:  83 y.o. female with medical history significant of dementia, paroxysmal atrial fibrillation on Eliquis , hypertension, GERD, seizure disorder on Trileptal  and Depakote , recurrent UTIs who presents today with altered mental status   Level of alertness: Awake, drowsy  AEDs during EEG study: VPA  Technical aspects: This EEG study was done with scalp electrodes positioned according to the 10-20 International system of electrode placement. Electrical activity was reviewed with band pass filter of 1-70Hz , sensitivity of 7 uV/mm, display speed of 13mm/sec with a 60Hz  notched filter applied as appropriate. EEG data were recorded continuously and digitally stored.  Video monitoring was available and reviewed as appropriate.  Description: The posterior dominant rhythm consists of 9 Hz activity of moderate voltage (25-35 uV) seen predominantly in posterior head regions, symmetric and reactive to eye opening and eye closing. Drowsiness was characterized by attenuation of the posterior background rhythm. Sleep was not seen. Hyperventilation and photic stimulation were not performed.     ABNORMALITY -None  IMPRESSION: This study is within normal limits. No seizures or epileptiform discharges were seen throughout the recording.  A normal interictal EEG does not exclude nor support the diagnosis of epilepsy.   Pastor Falling MD Neurology

## 2024-01-16 NOTE — Evaluation (Signed)
 Physical Therapy Evaluation Patient Details Name: Donna Barron MRN: [id number] DOB: 01/08/1941 Today's Date: 01/16/2024  History of Present Illness  Donna Barron is a 83 y.o. female with medical history significant of dementia, paroxysmal atrial fibrillation on Eliquis , hypertension, GERD, seizure disorder on Trileptal  and Depakote , recurrent UTIs.  Patient has been admitted 3 times in the last couple of months due to acute encephalopathy and recurrent UTIs.  Today, she was at lunch and she stopped responding.  She was brought to the hospital via EMS as a code stroke.  She has remained relatively unresponsive although alert and awake.  No fevers, chills, nausea, vomiting.  Neurology was consulted as part of code stroke.  She had a CT and a CT angiogram of head and neck with perfusion.  All of these were without concern.  She did have a UA that showed a UTI.   Clinical Impression  Patient demonstrates slow labored movement for sitting up at bedside requiring mod/maxA and frequent verbal and tactile cues to successfully complete. Patient limited to side steps at bedside with frequent tactile cues to advance feet during transfer. Patient presents with BIL LE weakness and poor dynamic balance that puts the patient at an increased risk for falls and limit her functional independence. Patient left in recliner with call bell within reach and family at bedside. Patient will benefit from continued skilled physical therapy in hospital and recommended venue below to increase strength, balance, endurance for safe ADLs and gait.         If plan is discharge home, recommend the following: A lot of help with bathing/dressing/bathroom;A lot of help with walking and/or transfers;Help with stairs or ramp for entrance;Assist for transportation   Can travel by private vehicle   Yes    Equipment Recommendations Rolling walker (2 wheels)  Recommendations for Other Services       Functional Status Assessment  Patient has had a recent decline in their functional status and demonstrates the ability to make significant improvements in function in a reasonable and predictable amount of time.     Precautions / Restrictions Precautions Precautions: Fall Recall of Precautions/Restrictions: Impaired Restrictions Weight Bearing Restrictions Per Provider Order: No      Mobility  Bed Mobility Overal bed mobility: Needs Assistance Bed Mobility: Supine to Sit     Supine to sit: Max assist     General bed mobility comments: very slow labored movement; stiff torso with much assist to pull to upright at EOB    Transfers Overall transfer level: Needs assistance Equipment used: Rolling walker (2 wheels) Transfers: Sit to/from Stand, Bed to chair/wheelchair/BSC Sit to Stand: Mod assist, Max assist   Step pivot transfers: Mod assist, Max assist       General transfer comment: labored movement; much extended time; frequent verbal and tactile cuing for steps towards chair.    Ambulation/Gait Ambulation/Gait assistance: Mod assist, Max assist Gait Distance (Feet): 5 Feet Assistive device: Rolling walker (2 wheels) Gait Pattern/deviations: Decreased step length - right, Decreased step length - left, Decreased stride length Gait velocity: decreased     General Gait Details: limited to side steps at bedside due to weakness and difficulty following commands  Stairs            Wheelchair Mobility     Tilt Bed    Modified Rankin (Stroke Patients Only)       Balance Overall balance assessment: Needs assistance Sitting-balance support: No upper extremity supported, Feet supported Sitting balance-Leahy Scale: Fair  Sitting balance - Comments: poor progressing to fair after some low back stretches while seated at the EOB Postural control: Posterior lean Standing balance support: Bilateral upper extremity supported, During functional activity, Reliant on assistive device for  balance Standing balance-Leahy Scale: Poor Standing balance comment: using RW                             Pertinent Vitals/Pain Pain Assessment Pain Assessment: No/denies pain    Home Living Family/patient expects to be discharged to:: Skilled nursing facility                   Additional Comments: Daughter present. Pt from SNF where she was getting short term rehab.    Prior Function Prior Level of Function : Needs assist       Physical Assist : ADLs (physical);Mobility (physical) Mobility (physical): Transfers;Gait;Stairs ADLs (physical): Toileting;Dressing;IADLs;Bathing Mobility Comments: Daughter reports the pt was able to ambualte at SNF with 75% assist using RW. ADLs Comments: Assisted a little for bathing, dressing, and toileting at baseline according to daughter. Pt reported a lot of assist needed.     Extremity/Trunk Assessment   Upper Extremity Assessment Upper Extremity Assessment: Defer to OT evaluation    Lower Extremity Assessment Lower Extremity Assessment: Generalized weakness    Cervical / Trunk Assessment Cervical / Trunk Assessment: Kyphotic  Communication   Communication Communication: No apparent difficulties    Cognition Arousal: Alert Behavior During Therapy: Flat affect   PT - Cognitive impairments: Initiation                       PT - Cognition Comments: Very delayed movement Following commands: Impaired Following commands impaired: Follows one step commands with increased time, Follows one step commands inconsistently     Cueing Cueing Techniques: Verbal cues, Tactile cues     General Comments      Exercises     Assessment/Plan    PT Assessment Patient needs continued PT services  PT Problem List Decreased strength;Decreased balance;Decreased mobility;Decreased activity tolerance       PT Treatment Interventions DME instruction;Therapeutic activities;Therapeutic exercise;Patient/family  education;Gait training;Functional mobility training;Stair training;Balance training    PT Goals (Current goals can be found in the Care Plan section)  Acute Rehab PT Goals Patient Stated Goal: return home PT Goal Formulation: With patient Time For Goal Achievement: 01/30/24 Potential to Achieve Goals: Good    Frequency Min 3X/week     Co-evaluation PT/OT/SLP Co-Evaluation/Treatment: Yes Reason for Co-Treatment: To address functional/ADL transfers PT goals addressed during session: Mobility/safety with mobility;Balance;Proper use of DME         AM-PAC PT 6 Clicks Mobility  Outcome Measure Help needed turning from your back to your side while in a flat bed without using bedrails?: A Little Help needed moving from lying on your back to sitting on the side of a flat bed without using bedrails?: A Lot Help needed moving to and from a bed to a chair (including a wheelchair)?: A Lot Help needed standing up from a chair using your arms (e.g., wheelchair or bedside chair)?: A Lot Help needed to walk in hospital room?: A Lot Help needed climbing 3-5 steps with a railing? : A Lot 6 Click Score: 13    End of Session Equipment Utilized During Treatment: Gait belt Activity Tolerance: Patient limited by fatigue Patient left: with family/visitor present;with call bell/phone within reach;in chair Nurse Communication: Mobility status  PT Visit Diagnosis: Unsteadiness on feet (R26.81);Muscle weakness (generalized) (M62.81);Other abnormalities of gait and mobility (R26.89)    Time: 9241-9176 PT Time Calculation (min) (ACUTE ONLY): 25 min   Charges:   PT Evaluation $PT Eval Moderate Complexity: 1 Mod PT Treatments $Therapeutic Activity: 23-37 mins PT General Charges $$ ACUTE PT VISIT: 1 Visit         2:15 PM, 01/16/24,  Ragina Fenter, SPT

## 2024-01-16 NOTE — Plan of Care (Signed)
  Problem: Acute Rehab PT Goals(only PT should resolve) Goal: Pt Will Go Supine/Side To Sit Outcome: Progressing Flowsheets (Taken 01/16/2024 1416) Pt will go Supine/Side to Sit: with moderate assist Goal: Patient Will Transfer Sit To/From Stand Outcome: Progressing Flowsheets (Taken 01/16/2024 1416) Patient will transfer sit to/from stand: with moderate assist Goal: Pt Will Transfer Bed To Chair/Chair To Bed Outcome: Progressing Flowsheets (Taken 01/16/2024 1416) Pt will Transfer Bed to Chair/Chair to Bed: with mod assist Goal: Pt Will Ambulate Outcome: Progressing Flowsheets (Taken 01/16/2024 1416) Pt will Ambulate:  with moderate assist  with rolling walker  15 feet

## 2024-01-16 NOTE — Progress Notes (Addendum)
 PROGRESS NOTE    Donna Barron  FMW:[id number] DOB: 1941/04/28 DOA: 01/14/2024 PCP: [name], NP   Brief Narrative:    Donna Barron is a 83 y.o. female with medical history significant of dementia, paroxysmal atrial fibrillation on Eliquis , hypertension, GERD, seizure disorder on Trileptal  and Depakote , recurrent UTIs.  Patient has been admitted 3 times in the last couple of months due to acute encephalopathy and recurrent UTIs.  Today, she was at lunch and she stopped responding.  She was brought to the hospital via EMS as a code stroke.  Patient was admitted with acute metabolic encephalopathy likely in the setting of UTI.  She was initially planned to go to [facility] for stat EEG, but with improvement in mentation and no significant findings on MRI, plan is for close monitoring on IV antibiotics with EEG that has been ordered and pending.  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 - CT head and brain MRI with no acute abnormalities - Discussed case with Dr. Zollie and due to improvement in mentation, plan for EEG on 8/4 which is still pending - Continue antiepileptics -As she has been getting recurrent UTIs, she should have urology or urogynecology follow-up outpatient with consideration of prophylaxis to prevent recurrence.  This may include d-mannose, vaginal estrogen, and/or nightly Macrobid   Seizure disorder - Continue antiepileptics  and plan for EEG to rule out any other abnormalities - Monitor for seizure activity  Hypertension - Hold with soft blood pressure readings  Paroxysmal atrial fibrillation - Resume Eliquis  and monitor on telemetry  Dementia  Obesity, class I -BMI 34.26    DVT prophylaxis: Eliquis  Code Status: Full Family Communication: Daughter at bedside 8/4 Disposition Plan:  Status is: Inpatient Remains inpatient appropriate because: Need for IV medications.  Consultants:  Discussed with neurology Dr. Zollie  Procedures:  None  Antimicrobials:  Anti-infectives (From admission, onward)    Start     Dose/Rate Route Frequency Ordered Stop   01/15/24 2100  cefTRIAXone  (ROCEPHIN ) 2 g in sodium chloride  0.9 % 100 mL IVPB  Status:  Discontinued        2 g 200 mL/hr over 30 Minutes Intravenous Every 24 hours 01/15/24 0657 01/15/24 0945   01/15/24 2000  cefTRIAXone  (ROCEPHIN ) 2 g in sodium chloride  0.9 % 100 mL IVPB        2 g 200 mL/hr over 30 Minutes Intravenous Every 24 hours 01/15/24 0942     01/15/24 2000  cefTRIAXone  (ROCEPHIN ) 2 g in sodium chloride  0.9 % 100 mL IVPB  Status:  Discontinued        2 g 200 mL/hr over 30 Minutes Intravenous Every 24 hours 01/15/24 0942 01/15/24 0945   01/14/24 2100  cefTRIAXone  (ROCEPHIN ) 2 g in sodium chloride  0.9 % 100 mL IVPB        2 g 200 mL/hr over 30 Minutes Intravenous  Once 01/14/24 2058 01/14/24 2157       Subjective: Patient seen and evaluated today and is much more awake  and alert and appears to be near her baseline with daughter at bedside.  No acute overnight events noted.  Objective: Vitals:   01/15/24 1251 01/15/24 1746 01/15/24 2001 01/16/24 0554  BP: 126/60 (!) 117/46 107/62 (!) 116/54  Pulse: 73 75 82 73  Resp: 20 20 18 18   Temp: 98.4 F (36.9 C) 98.2 F (36.8 C) 98 F (36.7 C) 98.1 F (36.7 C)  TempSrc: Oral Oral Oral Oral  SpO2: 94% 93% 96% 93%  Weight:      Height:        Intake/Output Summary (Last 24 hours) at  01/16/2024 1235 Last data filed at 01/16/2024 0500 Gross per 24 hour  Intake 390 ml  Output 825 ml  Net -435 ml   Filed Weights   01/14/24 1935  Weight: 85 kg    Examination:  General exam: Appears somnolent and lethargic, obese Respiratory system: Clear to auscultation. Respiratory effort normal. Cardiovascular system: S1 & S2 heard, RRR.  Gastrointestinal system: Abdomen is soft Central nervous system: Some alert/lethargic Extremities: No edema Skin: No significant lesions noted Psychiatry: Flat affect.    Data Reviewed: I have personally reviewed following labs and imaging studies  CBC: Recent Labs  Lab 01/14/24 1931 01/15/24 0715 01/15/24 1007  WBC 8.6 8.5 8.0  NEUTROABS 6.1  --   --   HGB 15.5* 15.0 15.3*  HCT 47.5* 45.0 46.4*  MCV 95.8 94.1 94.9  PLT 209 253 228   Basic Metabolic Panel: Recent Labs  Lab 01/14/24 1931 01/15/24 0715 01/15/24 1007  NA 134* 140 139  K 3.7 3.5 3.5  CL 94* 98 97*  CO2 28 30 29   GLUCOSE 134* 96 93  BUN 15 14 14   CREATININE 0.77 0.71 0.56  CALCIUM 9.2 9.4 9.5  MG  --  1.9  --    GFR: Estimated Creatinine Clearance: 54.9 mL/min (by C-G formula based on SCr of 0.56 mg/dL). Liver Function Tests: Recent Labs  Lab 01/14/24 1931 01/15/24 1007  AST 17 19  ALT 12 13  ALKPHOS 71 69  BILITOT 0.4 0.8  PROT 6.7 6.5  ALBUMIN 3.0* 3.1*   No results for input(s): LIPASE, AMYLASE in the last 168 hours. No results for input(s): AMMONIA in the last 168 hours. Coagulation Profile: Recent Labs  Lab 01/14/24 1931  INR 1.1   Cardiac Enzymes: No results for input(s): CKTOTAL, CKMB, CKMBINDEX, TROPONINI in the last 168 hours. BNP (last 3 results) No results for input(s): PROBNP in the last 8760 hours. HbA1C: Recent Labs    01/15/24 1007  HGBA1C 5.5   CBG: No results for input(s): GLUCAP in the last 168 hours. Lipid Profile: Recent Labs    01/15/24 1007  CHOL 151  HDL 43  LDLCALC 79  TRIG 146   CHOLHDL 3.5   Thyroid  Function Tests: No results for input(s): TSH, T4TOTAL, FREET4, T3FREE, THYROIDAB in the last 72 hours. Anemia Panel: No results for input(s): VITAMINB12, FOLATE, FERRITIN, TIBC, IRON, RETICCTPCT in the last 72 hours. Sepsis Labs: No results for input(s): PROCALCITON, LATICACIDVEN in the last 168 hours.  Recent Results (from the past 240 hours)  MRSA Next Gen by PCR, Nasal     Status: None   Collection Time: 01/15/24  1:48 PM   Specimen: Nasal Mucosa; Nasal Swab  Result Value Ref Range Status   MRSA by PCR Next Gen NOT DETECTED NOT DETECTED Final    Comment: (NOTE) The GeneXpert MRSA Assay (FDA approved for NASAL specimens only), is one component  of a comprehensive MRSA colonization surveillance program. It is not intended to diagnose MRSA infection nor to guide or monitor treatment for MRSA infections. Test performance is not FDA approved in patients less than 39 years old. Performed at [facility], [address] [place], [place] [address]          Radiology Studies: ECHOCARDIOGRAM COMPLETE Result Date: 01/15/2024    ECHOCARDIOGRAM REPORT   Patient Name:   Donna Barron Date of Exam: 01/15/2024 Medical Rec #:  [id number]      Height:       62.0 in Accession #:    [id number]     Weight:       187.3 lb Date of Birth:  [birth year]      BSA:          1.859 m Patient Age:    83 years       BP:           112/65 mmHg Patient Gender: F              HR:           69 bpm. Exam Location:  [facility] Procedure: 2D Echo, Color Doppler and Cardiac Doppler (Both Spectral and Color            Flow Doppler were utilized during procedure). Indications:    Stroke i63.9  History:        Patient has prior history of Echocardiogram examinations, most                 recent 09/30/2023. Arrythmias:Atrial Fibrillation; Risk                 Factors:Hypertension and Dyslipidemia.  Sonographer:    Damien Senior RDCS Referring Phys: [phone] JACOB JINNY PEEL  Sonographer  Comments: Technically difficult study due to patient body habitus and chest wall tenderness, unable to tolerate pressure for apicals. IMPRESSIONS  1. Left ventricular ejection fraction, by estimation, is 55 to 60%. The left ventricle has normal function. The left ventricle has no regional wall motion abnormalities. Left ventricular diastolic parameters are indeterminate.  2. Right ventricular systolic function is normal. The right ventricular size is normal.  3. Left atrial size was moderately dilated.  4. The mitral valve is degenerative. Mild mitral valve regurgitation. No evidence of mitral stenosis. Severe mitral annular calcification.  5. The aortic valve was not well visualized. There is mild calcification of the aortic valve. There is mild thickening of the aortic valve. Aortic valve regurgitation is not visualized. Aortic valve sclerosis is present, with no evidence of aortic valve  stenosis.  6. The inferior vena cava is normal in size with greater than 50% respiratory variability, suggesting right atrial pressure of 3 mmHg. FINDINGS  Left Ventricle: Left ventricular ejection fraction, by estimation, is 55 to 60%. The left ventricle has normal function. The left ventricle has no regional wall motion abnormalities. Strain was performed and the global longitudinal strain is indeterminate. The left ventricular internal cavity size was normal in size. There is no left ventricular hypertrophy. Left ventricular diastolic parameters are indeterminate. Right Ventricle: The right ventricular size is normal. No increase in right ventricular wall thickness. Right ventricular systolic function is normal. Left Atrium: Left atrial size was moderately dilated. Right Atrium: Right atrial size was normal in size. Pericardium: There is no evidence of pericardial effusion. Mitral Valve: The mitral valve is degenerative in appearance. Severe mitral annular calcification. Mild mitral valve regurgitation. No evidence of  mitral  valve stenosis. Tricuspid Valve: The tricuspid valve is normal in structure. Tricuspid valve regurgitation is not demonstrated. No evidence of tricuspid stenosis. Aortic Valve: The aortic valve was not well visualized. There is mild calcification of the aortic valve. There is mild thickening of the aortic valve. Aortic valve regurgitation is not visualized. Aortic valve sclerosis is present, with no evidence of aortic valve stenosis. Pulmonic Valve: The pulmonic valve was normal in structure. Pulmonic valve regurgitation is not visualized. No evidence of pulmonic stenosis. Aorta: The aortic root is normal in size and structure. Venous: The inferior vena cava is normal in size with greater than 50% respiratory variability, suggesting right atrial pressure of 3 mmHg. IAS/Shunts: The interatrial septum was not well visualized. Additional Comments: 3D was performed not requiring image post processing on an independent workstation and was indeterminate.  LEFT VENTRICLE PLAX 2D LVOT diam:     2.00 cm   Diastology LV SV:         65        LV e' medial:    5.87 cm/s LV SV Index:   35        LV E/e' medial:  13.2 LVOT Area:     3.14 cm  LV e' lateral:   7.83 cm/s                          LV E/e' lateral: 9.9  RIGHT VENTRICLE RV S prime:     12.90 cm/s TAPSE (M-mode): 1.2 cm LEFT ATRIUM              Index        RIGHT ATRIUM           Index LA Vol (A2C):   49.3 ml  26.52 ml/m  RA Area:     24.60 cm LA Vol (A4C):   104.0 ml 55.94 ml/m  RA Volume:   77.90 ml  41.90 ml/m LA Biplane Vol: 78.2 ml  42.06 ml/m  AORTIC VALVE LVOT Vmax:   74.30 cm/s LVOT Vmean:  61.000 cm/s LVOT VTI:    0.207 m  AORTA Ao Root diam: 2.90 cm MITRAL VALVE MV Area (PHT): 2.38 cm     SHUNTS MV Decel Time: 319 msec     Systemic VTI:  0.21 m MV E velocity: 77.60 cm/s   Systemic Diam: 2.00 cm MV A velocity: 124.00 cm/s MV E/A ratio:  0.63 Maude Emmer MD Electronically signed by Maude Emmer MD Signature Date/Time: 01/15/2024/12:19:25 PM    Final    MR  BRAIN WO CONTRAST Result Date: 01/15/2024 EXAM: MRI BRAIN WITHOUT CONTRAST 01/15/2024 08:11:29 AM TECHNIQUE: Multiplanar multisequence MRI of the head/brain was performed without the administration of intravenous contrast. COMPARISON: CT of the head dated 01/14/2024, CT angiogram of the head dated 01/14/2024, and MRI of the head dated 05/12/2001. CLINICAL HISTORY: Neuro deficit, acute, stroke suspected. FINDINGS: BRAIN AND VENTRICLES: No acute infarct. No intracranial hemorrhage. No mass. No midline shift. No hydrocephalus. The sella is unremarkable. Normal flow voids. Moderate age-related cerebral volume loss. Moderate periventricular and deep cerebral white matter disease. ORBITS: The patient is status post bilateral lens replacement. SINUSES AND MASTOIDS: No acute abnormality. BONES AND SOFT TISSUES: Normal marrow signal. No acute soft tissue abnormality. IMPRESSION: 1. No acute intracranial abnormality. 2. Moderate age-related cerebral volume loss. 3. Moderate periventricular and deep cerebral white matter disease. Electronically signed by: evalene coho 01/15/2024 08:15 AM EDT RP Workstation: [id number]  CT ANGIO HEAD NECK W WO CM W PERF (CODE STROKE) Result Date: 01/14/2024 EXAM: CTA Head and Neck with Perfusion 01/14/2024 08:31:09 PM TECHNIQUE: CTA of the head and neck was performed with the administration of intravenous contrast. 3D postprocessing with multiplanar reconstructions and MIPs was performed to evaluate the vascular anatomy. Automated exposure control, iterative reconstruction, and/or weight based adjustment of the mA/kV was utilized to reduce the radiation dose to as low as reasonably achievable. COMPARISON: None available CLINICAL HISTORY: Neuro deficit, acute, stroke suspected. Grip strength weakness, unable to talk/slurred speech, unable to raise arms. LKW 30-45 mins. Dr. Cleotilde [phone], Teleneuro, [phone] FINDINGS: CTA NECK: AORTIC ARCH AND ARCH VESSELS: No dissection or  arterial injury. No significant stenosis of the brachiocephalic or subclavian arteries. Mild calcific aortic atherosclerosis. CERVICAL CAROTID ARTERIES: No dissection, arterial injury, or hemodynamically significant stenosis by NASCET criteria. Mild atherosclerotic calcification at the right carotid bifurcation without hemodynamically significant stenosis. CERVICAL VERTEBRAL ARTERIES: No dissection, arterial injury, or significant stenosis. LUNGS AND MEDIASTINUM: Unremarkable. SOFT TISSUES: No acute abnormality. BONES: Multilevel degenerative disc disease. CTA HEAD: ANTERIOR CIRCULATION: No significant stenosis of the internal carotid arteries. Mild atherosclerotic calcification of the cavernous segments of both internal carotid arteries. No significant stenosis of the anterior cerebral arteries. No significant stenosis of the middle cerebral arteries. No aneurysm. POSTERIOR CIRCULATION: No significant stenosis of the posterior cerebral arteries. No significant stenosis of the basilar artery. No significant stenosis of the vertebral arteries. No aneurysm. OTHER: No dural venous sinus thrombosis on this non-dedicated study. CT PERFUSION: EXAM QUALITY: Exam quality is adequate with diagnostic perfusion maps. No significant motion artifact. Appropriate arterial inflow and venous outflow curves. CORE INFARCT (CBF<30% volume): 0 mL TOTAL HYPOPERFUSION (Tmax>6s volume): 0 mL PENUMBRA: Mismatch volume: 0 mL Mismatch ratio: Not applicable Location: Not applicable IMPRESSION: 1. No acute large vessel occlusion. 2. No hemodynamically significant stenosis or aneurysm in the head or neck vessels. 3. No evidence of ischemia by CT brain perfusion. Electronically signed by: Franky Stanford MD 01/14/2024 08:39 PM EDT RP Workstation: [id number]   CT HEAD CODE STROKE WO CONTRAST Result Date: 01/14/2024 EXAM: CT HEAD WITHOUT CONTRAST 01/14/2024 07:16:33 PM TECHNIQUE: CT of the head was performed without the administration of  intravenous contrast. Automated exposure control, iterative reconstruction, and/or weight based adjustment of the mA/kV was utilized to reduce the radiation dose to as low as reasonably achievable. COMPARISON: 10/13/2023 CLINICAL HISTORY: Neuro deficit, acute, stroke suspected. Grip strength weakness, unable to talk/slurred speech, unable to raise arms. LKW 30-45 mins. Dr. Cleotilde [phone], Teleneuro, [phone] FINDINGS: BRAIN AND VENTRICLES: No acute hemorrhage. Gray-white differentiation is preserved. No hydrocephalus. No extra-axial collection. No mass effect or midline shift. Hypoattenuation in the cerebral white matter, most likely representing chronic small vessel disease. Mild volume loss. ORBITS: No acute abnormality. SINUSES: No acute abnormality. SOFT TISSUES AND SKULL: No acute soft tissue abnormality. No skull fracture. IMPRESSION: 1. No acute intracranial abnormality. 2. Hypoattenuation in the cerebral white matter, most likely representing chronic small vessel disease. 3. Mild volume loss. 4. ASPECTS is 10 5. Findings discussed with Dr. Cleotilde at 07:27 pm Electronically signed by: Franky Stanford MD 01/14/2024 07:28 PM EDT RP Workstation: [id number]      LOS: 2 days    Time spent: 55 minutes    Stefannie Defeo D Maree, DO [facility]  If 7PM-7AM, please contact night-coverage [email or web address] 01/16/2024, 12:35 PM

## 2024-01-16 NOTE — Plan of Care (Signed)

## 2024-01-16 NOTE — Evaluation (Signed)
 Occupational Therapy Evaluation Patient Details Name: Donna Barron MRN: [id number] DOB: 1941/06/04 Today's Date: 01/16/2024   History of Present Illness   Donna Barron is a 83 y.o. female with medical history significant of dementia, paroxysmal atrial fibrillation on Eliquis , hypertension, GERD, seizure disorder on Trileptal  and Depakote , recurrent UTIs.  Patient has been admitted 3 times in the last couple of months due to acute encephalopathy and recurrent UTIs.  Today, she was at lunch and she stopped responding.  She was brought to the hospital via EMS as a code stroke.  She has remained relatively unresponsive although alert and awake.  No fevers, chills, nausea, vomiting.  Neurology was consulted as part of code stroke.  She had a CT and a CT angiogram of head and neck with perfusion.  All of these were without concern.  She did have a UA that showed a UTI. (per DO)     Clinical Impressions Pt agreeable to OT and PT co-evaluation. Pt required max A for bed mobility and mod to max A for step pivot transfer to chair with RW. Pt needing frequent verbal cuing to follow commands to step to chair. Max to total assist for lower body dressing at this time. B UE shoulder flexion limited bilaterally but more so for R shoulder. Set up to min A for UB ADL's at this time. Pt left in the chair with call bell within reach, family present, and chair alarm set. Pt will benefit from continued OT in the hospital and recommended venue below to increase strength, balance, and endurance for safe ADL's.        If plan is discharge home, recommend the following:   A lot of help with walking and/or transfers;A lot of help with bathing/dressing/bathroom;Assistance with cooking/housework;Direct supervision/assist for medications management;Assist for transportation;Help with stairs or ramp for entrance     Functional Status Assessment   Patient has had a recent decline in their functional status and  demonstrates the ability to make significant improvements in function in a reasonable and predictable amount of time.     Equipment Recommendations   None recommended by OT             Precautions/Restrictions   Precautions Precautions: Fall Recall of Precautions/Restrictions: Impaired Restrictions Weight Bearing Restrictions Per Provider Order: No     Mobility Bed Mobility Overal bed mobility: Needs Assistance Bed Mobility: Supine to Sit     Supine to sit: Max assist     General bed mobility comments: very slow labored movement; stiff torso with much assist to pull to upright at EOB    Transfers Overall transfer level: Needs assistance Equipment used: Rolling walker (2 wheels) Transfers: Sit to/from Stand, Bed to chair/wheelchair/BSC Sit to Stand: Mod assist, Max assist     Step pivot transfers: Mod assist, Max assist     General transfer comment: labored movement; much extended time; frequent verbal and tactile cuing for steps towards chair.      Balance Overall balance assessment: Needs assistance Sitting-balance support: No upper extremity supported, Feet supported Sitting balance-Leahy Scale: Fair Sitting balance - Comments: poor progressing to fair after some low back stretches while seated at the EOB Postural control: Posterior lean Standing balance support: Bilateral upper extremity supported, During functional activity, Reliant on assistive device for balance Standing balance-Leahy Scale: Poor Standing balance comment: using RW  ADL either performed or assessed with clinical judgement   ADL Overall ADL's : Needs assistance/impaired     Grooming: Set up;Sitting   Upper Body Bathing: Set up;Minimal assistance;Sitting   Lower Body Bathing: Maximal assistance;Total assistance;Sitting/lateral leans   Upper Body Dressing : Set up;Minimal assistance;Sitting   Lower Body Dressing: Maximal assistance;Total  assistance;Sitting/lateral leans Lower Body Dressing Details (indicate cue type and reason): Unable to manage socks today. Toilet Transfer: Moderate assistance;Maximal assistance;Rolling walker (2 wheels);Stand-pivot Statistician Details (indicate cue type and reason): To recliner from bed with RW Toileting- Clothing Manipulation and Hygiene: Maximal assistance;Total assistance;Sit to/from stand Toileting - Clothing Manipulation Details (indicate cue type and reason): PT completed peri-care while the pt stood with OT and PT student.             Vision Baseline Vision/History: 1 Wears glasses Ability to See in Adequate Light: 0 Adequate Patient Visual Report: No change from baseline Additional Comments: mild peripheral vision deficits bilaterally; no other impairments noted     Perception Perception: Not tested       Praxis Praxis: Not tested       Pertinent Vitals/Pain Pain Assessment Pain Assessment: No/denies pain     Extremity/Trunk Assessment Upper Extremity Assessment Upper Extremity Assessment: LUE deficits/detail;RUE deficits/detail RUE Deficits / Details: 2+/5 L shoulder flexion; generally weak otherwise. Daughter reports the hsoulder is weak at baseline. RUE Coordination: WNL LUE Deficits / Details: 3-/5 for shoulder flexion; generally weak otherwise.  LUE Coordination: WNL   Lower Extremity Assessment Lower Extremity Assessment: Defer to PT evaluation   Cervical / Trunk Assessment Cervical / Trunk Assessment: Kyphotic   Communication Communication Communication: No apparent difficulties   Cognition Arousal: Alert Behavior During Therapy: Flat affect Cognition: Cognition impaired   Orientation impairments: Time (Pt was not oriented to year but was month, place, and situation.)       Executive functioning impairment (select all impairments): Initiation OT - Cognition Comments: Slow to initiate commands today.                 Following  commands: Impaired Following commands impaired: Follows one step commands with increased time     Cueing  General Comments   Cueing Techniques: Verbal cues;Tactile cues                 Home Living Family/patient expects to be discharged to:: Skilled nursing facility                                 Additional Comments: Daughter present. Pt from SNF where she was getting short term rehab.      Prior Functioning/Environment Prior Level of Function : Needs assist       Physical Assist : ADLs (physical);Mobility (physical) Mobility (physical): Transfers;Gait;Stairs ADLs (physical): Toileting;Dressing;IADLs;Bathing Mobility Comments: Daughter reports the pt was able to ambualte at SNF with 75% assist using RW. ADLs Comments: Assisted a little for bathing, dressing, and toileting at baseline according to daughter. Pt reported a lot of assist needed.    OT Problem List: Decreased strength;Decreased range of motion;Decreased activity tolerance;Impaired balance (sitting and/or standing);Decreased cognition;Decreased knowledge of use of DME or AE   OT Treatment/Interventions: Self-care/ADL training;Therapeutic exercise;DME and/or AE instruction;Energy conservation;Therapeutic activities;Cognitive remediation/compensation;Patient/family education;Balance training      OT Goals(Current goals can be found in the care plan section)   Acute Rehab OT Goals Patient Stated Goal: improve function OT Goal Formulation: With patient/family Time  For Goal Achievement: 01/30/24 Potential to Achieve Goals: Fair   OT Frequency:  Min 3X/week    Co-evaluation PT/OT/SLP Co-Evaluation/Treatment: Yes Reason for Co-Treatment: To address functional/ADL transfers   OT goals addressed during session: ADL's and self-care                       End of Session Equipment Utilized During Treatment: Rolling walker (2 wheels);Gait belt  Activity Tolerance: Patient tolerated  treatment well Patient left: in chair;with call bell/phone within reach;with family/visitor present;with chair alarm set  OT Visit Diagnosis: Unsteadiness on feet (R26.81);Other abnormalities of gait and mobility (R26.89);Muscle weakness (generalized) (M62.81);Other symptoms and signs involving cognitive function                Time: 0802-0824 OT Time Calculation (min): 22 min Charges:  OT General Charges $OT Visit: 1 Visit OT Evaluation $OT Eval Low Complexity: 1 Low  Tatsuo Musial OT, MOT  Jayson Person 01/16/2024, 10:15 AM

## 2024-01-16 NOTE — TOC Initial Note (Signed)
 Transition of Care [facility]) - Initial/Assessment Note    Patient Details  Name: Donna Barron MRN: [id number] Date of Birth: [birth year]  [facility]) CM/SW Contact:    Mcarthur Saddie Kim, LCSW Phone Number: 01/16/2024, 9:03 AM  Clinical Narrative:  Pt admitted due to acute encephalopathy. Assessment completed with pt's daughter, [name] as pt oriented to self only per chart. [name] reports pt has been a resident at [facility] for about a month for rehab. Plan is for return to [facility] when medically stable. Per Kirke at facility, okay to return and no FL2 needed. TOC will follow.                  Expected Discharge Plan: Skilled Nursing Facility Barriers to Discharge: Continued Medical Work up   Patient Goals and CMS Choice Patient states their goals for this hospitalization and ongoing recovery are:: return to SNF   Choice offered to / list presented to : Adult Children [place] ownership interest in [facility].provided to:: Adult Children    Expected Discharge Plan and Services In-house Referral: Clinical Social Work   Post Acute Care Choice: Skilled Nursing Facility Living arrangements for the past 2 months: Skilled Nursing Facility                                      Prior Living Arrangements/Services Living arrangements for the past 2 months: Skilled Nursing Facility Lives with:: Facility Resident Patient language and need for interpreter reviewed:: Yes Do you feel safe going back to the place where you live?: Yes      Need for Family Participation in Patient Care: Yes (Comment) Care giver support system in place?: Yes (comment)   Criminal Activity/Legal Involvement Pertinent to Current Situation/Hospitalization: No - Comment as needed  Activities of Daily Living   ADL Screening (condition at time of admission) Independently performs ADLs?: No Does the patient have a NEW difficulty with bathing/dressing/toileting/self-feeding that is expected  to last >3 days?: Yes (Initiates electronic notice to provider for possible OT consult) Does the patient have a NEW difficulty with getting in/out of bed, walking, or climbing stairs that is expected to last >3 days?: Yes (Initiates electronic notice to provider for possible PT consult) Does the patient have a NEW difficulty with communication that is expected to last >3 days?: Yes (Initiates electronic notice to provider for possible SLP consult) Is the patient deaf or have difficulty hearing?: Yes Does the patient have difficulty seeing, even when wearing glasses/contacts?: Yes Does the patient have difficulty concentrating, remembering, or making decisions?: Yes  Permission Sought/Granted                  Emotional Assessment       Orientation: : Oriented to Self Alcohol / Substance Use: Not Applicable Psych Involvement: No (comment)  Admission diagnosis:  Aphasia [R47.01] Encephalopathy acute [G93.40] Patient Active Problem List   Diagnosis Date Noted   Encephalopathy acute 01/14/2024   Seizure disorder (HCC) 01/14/2024   UTI (urinary tract infection) 12/12/2023   Bilateral lower extremity edema 11/15/2023   Paroxysmal atrial fibrillation (HCC) 11/02/2023   Acute metabolic encephalopathy 10/13/2023   Memory loss 09/24/2021   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) 05/23/2018   S/P carpal tunnel and DeQuervains release right 07/14/17 07/20/2017   Carpal tunnel syndrome of right wrist    De Quervain's disease  (tenosynovitis)    Essential hypertension 11/15/2013   Palpitations 06/28/2013   Mixed hyperlipidemia 06/28/2013   Obesity, unspecified 06/28/2013   PCP:  [name], NP Pharmacy:   [facility] [address], [place] - [address] [place] [address] Phone: [phone] Fax: [phone]     Social Drivers of Health (SDOH) Social History: SDOH Screenings   Food Insecurity: No Food Insecurity (01/15/2024)  Housing: Low Risk  (01/16/2024)  Transportation Needs: No Transportation Needs (01/15/2024)  Utilities: Not At Risk (01/15/2024)  Financial Resource Strain: Low Risk  (08/11/2023)   Received from [facility]  Physical Activity: Unknown (01/13/2023)   Received from [facility]  Social Connections: Unknown (01/16/2024)  Recent Concern: Social Connections - Moderately Isolated (12/12/2023)  Stress: No Stress Concern Present (01/13/2023)   Received from [facility]  Tobacco Use: Low Risk  (01/14/2024)   SDOH Interventions:     Readmission Risk Interventions    10/13/2023    9:11 PM  Readmission Risk Prevention Plan  Post Dischage Appt Complete  Medication Screening Complete  Transportation Screening Complete

## 2024-01-16 NOTE — Plan of Care (Signed)
  Problem: Acute Rehab OT Goals (only OT should resolve) Goal: Pt. Will Perform Grooming Flowsheets (Taken 01/16/2024 1018) Pt Will Perform Grooming: with modified independence Goal: Pt. Will Perform Upper Body Dressing Flowsheets (Taken 01/16/2024 1018) Pt Will Perform Upper Body Dressing: with modified independence Goal: Pt. Will Perform Lower Body Dressing Flowsheets (Taken 01/16/2024 1018) Pt Will Perform Lower Body Dressing:  with min assist  sitting/lateral leans  with adaptive equipment Goal: Pt. Will Transfer To Toilet Flowsheets (Taken 01/16/2024 1018) Pt Will Transfer to Toilet:  with contact guard assist  stand pivot transfer Goal: Pt. Will Perform Toileting-Clothing Manipulation Flowsheets (Taken 01/16/2024 1018) Pt Will Perform Toileting - Clothing Manipulation and hygiene:  with contact guard assist  with min assist  sitting/lateral leans Goal: Pt/Caregiver Will Perform Home Exercise Program Flowsheets (Taken 01/16/2024 1018) Pt/caregiver will Perform Home Exercise Program:  Increased ROM  Increased strength  Both right and left upper extremity  Independently  Jevon Littlepage OT, MOT

## 2024-01-17 ENCOUNTER — Encounter: Payer: Self-pay | Admitting: Internal Medicine

## 2024-01-17 ENCOUNTER — Non-Acute Institutional Stay (SKILLED_NURSING_FACILITY): Payer: Self-pay | Admitting: Internal Medicine

## 2024-01-17 DIAGNOSIS — G9340 Encephalopathy, unspecified: Secondary | ICD-10-CM | POA: Diagnosis not present

## 2024-01-17 DIAGNOSIS — F0390 Unspecified dementia without behavioral disturbance: Secondary | ICD-10-CM | POA: Diagnosis not present

## 2024-01-17 DIAGNOSIS — I10 Essential (primary) hypertension: Secondary | ICD-10-CM

## 2024-01-17 DIAGNOSIS — Z9189 Other specified personal risk factors, not elsewhere classified: Secondary | ICD-10-CM | POA: Diagnosis not present

## 2024-01-17 DIAGNOSIS — N3000 Acute cystitis without hematuria: Secondary | ICD-10-CM | POA: Diagnosis not present

## 2024-01-17 LAB — URINE CULTURE: Culture: >=100000 — AB

## 2024-01-17 MED ORDER — ESTROGENS, CONJUGATED 0.625 MG/GM VA CREA: 0.5000 g | TOPICAL_CREAM | Freq: Every day | VAGINAL | 1 refills | Status: AC

## 2024-01-17 MED ORDER — NITROFURANTOIN MONOHYD MACRO 100 MG PO CAPS: 100.0000 mg | ORAL_CAPSULE | Freq: Every day | ORAL | 0 refills | Status: DC

## 2024-01-17 NOTE — Assessment & Plan Note (Signed)
 Serially blood pressures have been soft; metoprolol  will be decreased to the 50 mg once daily with ongoing blood pressure monitor at the SNF.

## 2024-01-17 NOTE — Discharge Summary (Signed)
 Physician Discharge Summary  JOHONNA BINETTE FMW:[id number] DOB: [birth year] DOA: 01/14/2024  PCP: [name], NP  Admit date: 01/14/2024  Discharge date: 01/17/2024  Admitted From:SNF  Disposition:  SNF  Recommendations for Outpatient Follow-up:  Follow up with PCP in 1-2 weeks Completed 3-day course of IV Rocephin  for E. coli UTI, remain on Macrobid  nightly as prescribed for prophylaxis given recurrent issues with UTI Start estrogen cream with vaginal application Continue other home medications as prior  Home Health: None  Equipment/Devices: None  Discharge Condition:Stable  CODE STATUS: Full  Diet recommendation: Heart Healthy  Brief/Interim Summary: Donna Barron is a 83 y.o. female with medical history significant of dementia, paroxysmal atrial fibrillation on Eliquis , hypertension, GERD, seizure disorder on Trileptal  and Depakote , recurrent UTIs.  Patient has been admitted 3 times in the last couple of months due to acute encephalopathy and recurrent UTIs.  Today, she was at lunch and she stopped responding.  She was brought to the hospital via EMS as a code stroke.  Patient was admitted with acute metabolic encephalopathy likely in the setting of UTI.  She was seen by neurology with recommendations for CT and MRI imaging of the head with no acute findings.  She also underwent EEG with no acute findings noted.  She has responded well to IV antibiotics with Rocephin  and has had urine cultures grow E. coli that was sensitive to Rocephin  as well as Macrobid .  Given the fact that she has recurrent UTIs, she will be prescribed vaginal estrogen cream as well as Macrobid  for prophylaxis and could consider something like d-mannose in the outpatient setting as well.  No other acute events or concerns noted and she is stable to go back to SNF today.  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 coli UTI.  Discharge Instructions  Discharge Instructions     Diet - low sodium heart healthy   Complete by: As directed    Increase activity slowly   Complete by: As directed       Allergies as of 01/17/2024       Reactions   Codeine  Swelling   Swelling of lips   Hydrocodone  Swelling   Lip swelling   Avelox [moxifloxacin Hcl In Nacl] Other (See Comments)   Insomnia    Oxybutynin Palpitations, Other (See Comments)   Tongue felt coated         Medication List     TAKE these medications    apixaban  5 MG Tabs tablet Commonly known as: Eliquis  Take 1 tablet (5 mg total) by mouth 2 (two) times daily.   conjugated estrogens  0.625 MG/GM vaginal cream Commonly known as: PREMARIN  Place 0.25 Applicatorfuls vaginally daily.   cyanocobalamin  500 MCG tablet Commonly known as: VITAMIN B12 Take 1 tablet (500 mcg total) by mouth daily.   divalproex  250 MG 24 hr tablet Commonly known as: Depakote  ER Take 2 tablets every night What changed:  how much to take how to take this when to take this additional instructions   Dulcolax 5 MG EC tablet Generic drug: bisacodyl  Take 1 tablet (5 mg total) by mouth daily as needed for moderate constipation.   escitalopram  20 MG tablet  Commonly known as: LEXAPRO  Take 1 tablet (20 mg total) by mouth at bedtime.   folic acid  1 MG tablet Commonly known as: FOLVITE  Take 1 tablet (1 mg total) by mouth daily.   furosemide  40 MG tablet Commonly known as: LASIX  Take 1 tablet (40 mg total) by mouth daily.   melatonin 5 MG Tabs Take 5 mg by mouth at bedtime.   metoprolol  succinate 50 MG 24 hr tablet Commonly known as: TOPROL -XL Take 1.5 tablets (75 mg total) by  mouth daily.   nitrofurantoin  (macrocrystal-monohydrate) 100 MG capsule Commonly known as: Macrobid  Take 1 capsule (100 mg total) by mouth at bedtime.   nystatin  powder Apply 1 Application topically 2 (two) times daily as needed.   omeprazole 40 MG capsule Commonly known as: PRILOSEC Take 40 mg by mouth daily.   OXcarbazepine  150 MG tablet Commonly known as: TRILEPTAL  Take 1 tablet (150 mg total) by mouth 2 (two) times daily.   potassium chloride  SA 20 MEQ tablet Commonly known as: KLOR-CON  M Take 1 tablet (20 mEq total) by mouth daily.   simvastatin  40 MG tablet Commonly known as: ZOCOR  Take 1 tablet (40 mg total) by mouth at bedtime.   Vitamin D  (Ergocalciferol ) 1.25 MG (50000 UNIT) Caps capsule Commonly known as: DRISDOL  Take 1 capsule (50,000 Units total) by mouth once a week. What changed: when to take this        Contact information for follow-up providers     [name], NP. Schedule an appointment as soon as possible for a visit in 1 week(s).   Specialty: Family Medicine Contact information: [address] [place] [address] [phone]              Contact information for after-discharge care     Destination     [facility] .   Service: Skilled Nursing Contact information: [address] [place]  [address] [phone]                    Allergies  Allergen Reactions   Codeine  Swelling    Swelling of lips   Hydrocodone  Swelling    Lip swelling   Avelox [Moxifloxacin Hcl In Nacl] Other (See Comments)    Insomnia    Oxybutynin Palpitations and Other (See Comments)    Tongue felt coated     Consultations: Neurology   Procedures/Studies: EEG adult Result Date: 01/16/2024 Gregg Lek, MD     01/16/2024  4:56 PM Patient Name: Donna Barron MRN: [id number] Epilepsy Attending: Lek Gregg Referring Physician/Provider: No ref. provider found     Date: 01/16/2024 Duration: 23 minutes  Patient history:  83 y.o. female with medical history significant of dementia, paroxysmal atrial fibrillation on Eliquis , hypertension, GERD, seizure disorder on Trileptal  and Depakote , recurrent UTIs who presents today with altered mental status Level of alertness: Awake, drowsy AEDs during EEG study: VPA Technical aspects: This EEG study was done with scalp electrodes positioned according to the 10-20 International system of electrode placement. Electrical activity was reviewed with band pass filter of 1-70Hz , sensitivity of 7 uV/mm, display speed of 61mm/sec with a 60Hz  notched filter applied as appropriate. EEG data were recorded continuously and digitally stored.  Video monitoring was available and reviewed as appropriate. Description: The posterior dominant rhythm consists of 9 Hz activity of moderate voltage (25-35 uV) seen predominantly in posterior head regions, symmetric and reactive to eye opening and eye closing. Drowsiness was characterized by attenuation of the  posterior background rhythm. Sleep was not seen. Hyperventilation and photic stimulation were not performed.   ABNORMALITY -None IMPRESSION: This study is within normal limits. No seizures or epileptiform discharges were seen throughout the recording. A normal interictal EEG does not exclude nor support the diagnosis of epilepsy. Pastor Falling MD Neurology    ECHOCARDIOGRAM COMPLETE Result Date: 01/15/2024    ECHOCARDIOGRAM REPORT   Patient Name:   KLARISSA MCILVAIN Date of Exam: 01/15/2024 Medical Rec #:  [id number]      Height:       62.0 in Accession #:    [id number]     Weight:       187.3 lb Date of Birth:  02-Mar-1941      BSA:          1.859 m Patient Age:    83 years       BP:           112/65 mmHg Patient Gender: F              HR:           69 bpm. Exam Location:  [facility] Procedure: 2D Echo, Color Doppler and Cardiac Doppler (Both Spectral and Color            Flow Doppler were utilized during procedure). Indications:    Stroke i63.9   History:        Patient has prior history of Echocardiogram examinations, most                 recent 09/30/2023. Arrythmias:Atrial Fibrillation; Risk                 Factors:Hypertension and Dyslipidemia.  Sonographer:    Damien Senior RDCS Referring Phys: [phone] JACOB JINNY PEEL  Sonographer Comments: Technically difficult study due to patient body habitus and chest wall tenderness, unable to tolerate pressure for apicals. IMPRESSIONS  1. Left ventricular ejection fraction, by estimation, is 55 to 60%. The left ventricle has normal function. The left ventricle has no regional wall motion abnormalities. Left ventricular diastolic parameters are indeterminate.  2. Right ventricular systolic function is normal. The right ventricular size is normal.  3. Left atrial size was moderately dilated.  4. The mitral valve is degenerative. Mild mitral valve regurgitation. No evidence of mitral stenosis. Severe mitral annular calcification.  5. The aortic valve was not well visualized. There is mild calcification of the aortic valve. There is mild thickening of the aortic valve. Aortic valve regurgitation is not visualized. Aortic valve sclerosis is present, with no evidence of aortic valve  stenosis.  6. The inferior vena cava is normal in size with greater than 50% respiratory variability, suggesting right atrial pressure of 3 mmHg. FINDINGS  Left Ventricle: Left ventricular ejection fraction, by estimation, is 55 to 60%. The left ventricle has normal function. The left ventricle has no regional wall motion abnormalities. Strain was performed and the global longitudinal strain is indeterminate. The left ventricular internal cavity size was normal in size. There is no left ventricular hypertrophy. Left ventricular diastolic parameters are indeterminate. Right Ventricle: The right ventricular size is normal. No increase in right ventricular wall thickness. Right ventricular systolic function is normal. Left Atrium: Left atrial size  was moderately dilated. Right Atrium: Right atrial size was normal in size. Pericardium: There is no evidence of pericardial effusion. Mitral Valve: The mitral valve is degenerative in appearance. Severe mitral annular calcification. Mild mitral valve regurgitation. No evidence of mitral valve stenosis.  Tricuspid Valve: The tricuspid valve is normal in structure. Tricuspid valve regurgitation is not demonstrated. No evidence of tricuspid stenosis. Aortic Valve: The aortic valve was not well visualized. There is mild calcification of the aortic valve. There is mild thickening of the aortic valve. Aortic valve regurgitation is not visualized. Aortic valve sclerosis is present, with no evidence of aortic valve stenosis. Pulmonic Valve: The pulmonic valve was normal in structure. Pulmonic valve regurgitation is not visualized. No evidence of pulmonic stenosis. Aorta: The aortic root is normal in size and structure. Venous: The inferior vena cava is normal in size with greater than 50% respiratory variability, suggesting right atrial pressure of 3 mmHg. IAS/Shunts: The interatrial septum was not well visualized. Additional Comments: 3D was performed not requiring image post processing on an independent workstation and was indeterminate.  LEFT VENTRICLE PLAX 2D LVOT diam:     2.00 cm   Diastology LV SV:         65        LV e' medial:    5.87 cm/s LV SV Index:   35        LV E/e' medial:  13.2 LVOT Area:     3.14 cm  LV e' lateral:   7.83 cm/s                          LV E/e' lateral: 9.9  RIGHT VENTRICLE RV S prime:     12.90 cm/s TAPSE (M-mode): 1.2 cm LEFT ATRIUM              Index        RIGHT ATRIUM           Index LA Vol (A2C):   49.3 ml  26.52 ml/m  RA Area:     24.60 cm LA Vol (A4C):   104.0 ml 55.94 ml/m  RA Volume:   77.90 ml  41.90 ml/m LA Biplane Vol: 78.2 ml  42.06 ml/m  AORTIC VALVE LVOT Vmax:   74.30 cm/s LVOT Vmean:  61.000 cm/s LVOT VTI:    0.207 m  AORTA Ao Root diam: 2.90 cm MITRAL VALVE MV Area  (PHT): 2.38 cm     SHUNTS MV Decel Time: 319 msec     Systemic VTI:  0.21 m MV E velocity: 77.60 cm/s   Systemic Diam: 2.00 cm MV A velocity: 124.00 cm/s MV E/A ratio:  0.63 Maude Emmer MD Electronically signed by Maude Emmer MD Signature Date/Time: 01/15/2024/12:19:25 PM    Final    MR BRAIN WO CONTRAST Result Date: 01/15/2024 EXAM: MRI BRAIN WITHOUT CONTRAST 01/15/2024 08:11:29 AM TECHNIQUE: Multiplanar multisequence MRI of the head/brain was performed without the administration of intravenous contrast. COMPARISON: CT of the head dated 01/14/2024, CT angiogram of the head dated 01/14/2024, and MRI of the head dated 05/12/2001. CLINICAL HISTORY: Neuro deficit, acute, stroke suspected. FINDINGS: BRAIN AND VENTRICLES: No acute infarct. No intracranial hemorrhage. No mass. No midline shift. No hydrocephalus. The sella is unremarkable. Normal flow voids. Moderate age-related cerebral volume loss. Moderate periventricular and deep cerebral white matter disease. ORBITS: The patient is status post bilateral lens replacement. SINUSES AND MASTOIDS: No acute abnormality. BONES AND SOFT TISSUES: Normal marrow signal. No acute soft tissue abnormality. IMPRESSION: 1. No acute intracranial abnormality. 2. Moderate age-related cerebral volume loss. 3. Moderate periventricular and deep cerebral white matter disease. Electronically signed by: evalene coho 01/15/2024 08:15 AM EDT RP Workstation: [id number]   CT ANGIO HEAD NECK  W WO CM W PERF (CODE STROKE) Result Date: 01/14/2024 EXAM: CTA Head and Neck with Perfusion 01/14/2024 08:31:09 PM TECHNIQUE: CTA of the head and neck was performed with the administration of intravenous contrast. 3D postprocessing with multiplanar reconstructions and MIPs was performed to evaluate the vascular anatomy. Automated exposure control, iterative reconstruction, and/or weight based adjustment of the mA/kV was utilized to reduce the radiation dose to as low as reasonably achievable.  COMPARISON: None available CLINICAL HISTORY: Neuro deficit, acute, stroke suspected. Grip strength weakness, unable to talk/slurred speech, unable to raise arms. LKW 30-45 mins. Dr. Cleotilde [phone], Teleneuro, [phone] FINDINGS: CTA NECK: AORTIC ARCH AND ARCH VESSELS: No dissection or arterial injury. No significant stenosis of the brachiocephalic or subclavian arteries. Mild calcific aortic atherosclerosis. CERVICAL CAROTID ARTERIES: No dissection, arterial injury, or hemodynamically significant stenosis by NASCET criteria. Mild atherosclerotic calcification at the right carotid bifurcation without hemodynamically significant stenosis. CERVICAL VERTEBRAL ARTERIES: No dissection, arterial injury, or significant stenosis. LUNGS AND MEDIASTINUM: Unremarkable. SOFT TISSUES: No acute abnormality. BONES: Multilevel degenerative disc disease. CTA HEAD: ANTERIOR CIRCULATION: No significant stenosis of the internal carotid arteries. Mild atherosclerotic calcification of the cavernous segments of both internal carotid arteries. No significant stenosis of the anterior cerebral arteries. No significant stenosis of the middle cerebral arteries. No aneurysm. POSTERIOR CIRCULATION: No significant stenosis of the posterior cerebral arteries. No significant stenosis of the basilar artery. No significant stenosis of the vertebral arteries. No aneurysm. OTHER: No dural venous sinus thrombosis on this non-dedicated study. CT PERFUSION: EXAM QUALITY: Exam quality is adequate with diagnostic perfusion maps. No significant motion artifact. Appropriate arterial inflow and venous outflow curves. CORE INFARCT (CBF<30% volume): 0 mL TOTAL HYPOPERFUSION (Tmax>6s volume): 0 mL PENUMBRA: Mismatch volume: 0 mL Mismatch ratio: Not applicable Location: Not applicable IMPRESSION: 1. No acute large vessel occlusion. 2. No hemodynamically significant stenosis or aneurysm in the head or neck vessels. 3. No evidence of ischemia by CT brain  perfusion. Electronically signed by: Franky Stanford MD 01/14/2024 08:39 PM EDT RP Workstation: [id number]   CT HEAD CODE STROKE WO CONTRAST Result Date: 01/14/2024 EXAM: CT HEAD WITHOUT CONTRAST 01/14/2024 07:16:33 PM TECHNIQUE: CT of the head was performed without the administration of intravenous contrast. Automated exposure control, iterative reconstruction, and/or weight based adjustment of the mA/kV was utilized to reduce the radiation dose to as low as reasonably achievable. COMPARISON: 10/13/2023 CLINICAL HISTORY: Neuro deficit, acute, stroke suspected. Grip strength weakness, unable to talk/slurred speech, unable to raise arms. LKW 30-45 mins. Dr. Cleotilde [phone], Teleneuro, [phone] FINDINGS: BRAIN AND VENTRICLES: No acute hemorrhage. Gray-white differentiation is preserved. No hydrocephalus. No extra-axial collection. No mass effect or midline shift. Hypoattenuation in the cerebral white matter, most likely representing chronic small vessel disease. Mild volume loss. ORBITS: No acute abnormality. SINUSES: No acute abnormality. SOFT TISSUES AND SKULL: No acute soft tissue abnormality. No skull fracture. IMPRESSION: 1. No acute intracranial abnormality. 2. Hypoattenuation in the cerebral white matter, most likely representing chronic small vessel disease. 3. Mild volume loss. 4. ASPECTS is 10 5. Findings discussed with Dr. Cleotilde at 07:27 pm Electronically signed by: Franky Stanford MD 01/14/2024 07:28 PM EDT RP Workstation: [id number]     Discharge Exam: Vitals:   01/16/24 2037 01/17/24 0541  BP: 122/60 (!) 122/57  Pulse: 83 69  Resp: 18 18  Temp: 98.3 F (36.8 C) 98.5 F (36.9 C)  SpO2: 97% 98%   Vitals:   01/16/24 0554 01/16/24 1325 01/16/24 2037 01/17/24 0541  BP: (!) 116/54 (!) 113/50  122/60 (!) 122/57  Pulse: 73 83 83 69  Resp: 18 18 18 18   Temp: 98.1 F (36.7 C) 99.8 F (37.7 C) 98.3 F (36.8 C) 98.5 F (36.9 C)  TempSrc: Oral Oral    SpO2: 93% 100% 97% 98%  Weight:       Height:        General: Pt is alert, awake, not in acute distress Cardiovascular: RRR, S1/S2 +, no rubs, no gallops Respiratory: CTA bilaterally, no wheezing, no rhonchi Abdominal: Soft, NT, ND, bowel sounds + Extremities: no edema, no cyanosis    The results of significant diagnostics from this hospitalization (including imaging, microbiology, ancillary and laboratory) are listed below for reference.     Microbiology: Recent Results (from the past 240 hours)  Urine Culture     Status: Abnormal   Collection Time: 01/14/24  8:59 PM   Specimen: Urine, Clean Catch  Result Value Ref Range Status   Specimen Description   Final    URINE, CLEAN CATCH Performed at [facility], [address] [place], [place] [address]    Special Requests   Final    NONE Performed at [facility], [address] [place], [place] [address]    Culture >=100,000 COLONIES/mL ESCHERICHIA COLI (A)  Final   Report Status 01/17/2024 FINAL  Final   Organism ID, Bacteria ESCHERICHIA COLI (A)  Final      Susceptibility   Escherichia coli - MIC*    AMPICILLIN >=32 RESISTANT Resistant     CEFAZOLIN  32 INTERMEDIATE Intermediate     CEFEPIME <=0.12 SENSITIVE Sensitive     CEFTRIAXONE  <=0.25 SENSITIVE Sensitive     CIPROFLOXACIN >=4 RESISTANT Resistant     GENTAMICIN <=1 SENSITIVE Sensitive     IMIPENEM <=0.25 SENSITIVE Sensitive     NITROFURANTOIN  <=16 SENSITIVE Sensitive     TRIMETH/SULFA <=20 SENSITIVE Sensitive     AMPICILLIN/SULBACTAM >=32 RESISTANT Resistant     PIP/TAZO >=128 RESISTANT Resistant ug/mL    * >=100,000 COLONIES/mL ESCHERICHIA COLI  MRSA Next Gen by PCR, Nasal     Status: None   Collection Time: 01/15/24  1:48 PM   Specimen: Nasal Mucosa; Nasal Swab  Result Value Ref Range Status   MRSA by PCR Next Gen NOT DETECTED NOT DETECTED Final    Comment: (NOTE) The GeneXpert MRSA Assay (FDA approved for NASAL specimens only), is one component of a comprehensive MRSA colonization  surveillance program. It is not intended to diagnose MRSA infection nor to guide or monitor treatment for MRSA infections. Test performance is not FDA approved in patients less than 57 years old. Performed at [facility], [address], [place], [place] [address]      Labs: BNP (last 3 results) Recent Labs    12/12/23 1127  BNP 37.0   Basic Metabolic Panel: Recent Labs  Lab 01/14/24 1931 01/15/24 0715 01/15/24 1007  NA 134* 140 139  K 3.7 3.5 3.5  CL 94* 98 97*  CO2 28 30 29   GLUCOSE 134* 96 93  BUN 15 14 14   CREATININE 0.77 0.71 0.56  CALCIUM 9.2 9.4 9.5  MG  --  1.9  --    Liver Function Tests: Recent Labs  Lab 01/14/24 1931 01/15/24 1007  AST 17 19  ALT 12 13  ALKPHOS 71 69  BILITOT 0.4 0.8  PROT 6.7 6.5  ALBUMIN 3.0* 3.1*   No results for input(s): LIPASE, AMYLASE in the last 168 hours. No results for input(s): AMMONIA in the last 168  hours. CBC: Recent Labs  Lab 01/14/24 1931 01/15/24 0715 01/15/24 1007  WBC 8.6 8.5 8.0  NEUTROABS 6.1  --   --   HGB 15.5* 15.0 15.3*  HCT 47.5* 45.0 46.4*  MCV 95.8 94.1 94.9  PLT 209 253 228   Cardiac Enzymes: No results for input(s): CKTOTAL, CKMB, CKMBINDEX, TROPONINI in the last 168 hours. BNP: Invalid input(s): POCBNP CBG: No results for input(s): GLUCAP in the last 168 hours. D-Dimer No results for input(s): DDIMER in the last 72 hours. Hgb A1c Recent Labs    01/15/24 1007  HGBA1C 5.5   Lipid Profile Recent Labs    01/15/24 1007  CHOL 151  HDL 43  LDLCALC 79  TRIG 146  CHOLHDL 3.5   Thyroid  function studies No results for input(s): TSH, T4TOTAL, T3FREE, THYROIDAB in the last 72 hours.  Invalid input(s): FREET3 Anemia work up No results for input(s): VITAMINB12, FOLATE, FERRITIN, TIBC, IRON, RETICCTPCT in the last 72 hours. Urinalysis    Component Value Date/Time   COLORURINE YELLOW 01/14/2024 2010   APPEARANCEUR CLOUDY (A) 01/14/2024 2010    LABSPEC 1.010 01/14/2024 2010   PHURINE 6.0 01/14/2024 2010   GLUCOSEU NEGATIVE 01/14/2024 2010   HGBUR MODERATE (A) 01/14/2024 2010   BILIRUBINUR NEGATIVE 01/14/2024 2010   KETONESUR NEGATIVE 01/14/2024 2010   PROTEINUR 30 (A) 01/14/2024 2010   NITRITE NEGATIVE 01/14/2024 2010   LEUKOCYTESUR LARGE (A) 01/14/2024 2010   Sepsis Labs Recent Labs  Lab 01/14/24 1931 01/15/24 0715 01/15/24 1007  WBC 8.6 8.5 8.0   Microbiology Recent Results (from the past 240 hours)  Urine Culture     Status: Abnormal   Collection Time: 01/14/24  8:59 PM   Specimen: Urine, Clean Catch  Result Value Ref Range Status   Specimen Description   Final    URINE, CLEAN CATCH Performed at [facility], [address] [place], [place] [address]    Special Requests   Final    NONE Performed at [facility], [address] [place], [place] [address]    Culture >=100,000 COLONIES/mL ESCHERICHIA COLI (A)  Final   Report Status 01/17/2024 FINAL  Final   Organism ID, Bacteria ESCHERICHIA COLI (A)  Final      Susceptibility   Escherichia coli - MIC*    AMPICILLIN >=32 RESISTANT Resistant     CEFAZOLIN  32 INTERMEDIATE Intermediate     CEFEPIME <=0.12 SENSITIVE Sensitive     CEFTRIAXONE  <=0.25 SENSITIVE Sensitive     CIPROFLOXACIN >=4 RESISTANT Resistant     GENTAMICIN <=1 SENSITIVE Sensitive     IMIPENEM <=0.25 SENSITIVE Sensitive     NITROFURANTOIN  <=16 SENSITIVE Sensitive     TRIMETH/SULFA <=20 SENSITIVE Sensitive     AMPICILLIN/SULBACTAM >=32 RESISTANT Resistant     PIP/TAZO >=128 RESISTANT Resistant ug/mL    * >=100,000 COLONIES/mL ESCHERICHIA COLI  MRSA Next Gen by PCR, Nasal     Status: None   Collection Time: 01/15/24  1:48 PM   Specimen: Nasal Mucosa; Nasal Swab  Result Value Ref Range Status   MRSA by PCR Next Gen NOT DETECTED NOT DETECTED Final    Comment: (NOTE) The GeneXpert MRSA Assay (FDA approved for NASAL specimens only), is one component of a comprehensive MRSA colonization  surveillance program. It is not intended to diagnose MRSA infection nor to guide or monitor treatment for MRSA infections. Test performance is not FDA approved in patients less than 63 years old. Performed at [facility], [address] [place], [place] [address]  Time coordinating discharge: 35 minutes  SIGNED:   Adron JONETTA Fairly, DO [facility] 01/17/2024, 10:12 AM  If 7PM-7AM, please contact night-coverage [email or web address]

## 2024-01-17 NOTE — TOC Transition Note (Signed)
 Transition of Care [facility]) - Discharge Note   Patient Details  Name: Donna Barron MRN: [id number] Date of Birth: 01/11/1941  Transition of Care [facility]) CM/SW Contact:  Mcarthur Saddie Kim, LCSW Phone Number: 01/17/2024, 10:37 AM   Clinical Narrative:  Pt d/c today back to [facility]. Pt's daughter [name] and facility aware and agreeable. D/C summary sent to SNF. RN given number to call report. Will transfer with staff.      Final next level of care: Skilled Nursing Facility Barriers to Discharge: Barriers Resolved   Patient Goals and CMS Choice Patient states their goals for this hospitalization and ongoing recovery are:: return to SNF   Choice offered to / list presented to : Adult Children  ownership interest in [facility]provided to:: Adult Children    Discharge Placement              Patient chooses bed at: [facility] Patient to be transferred to facility by: staff Name of family member notified: daughter- [name] Patient and family notified of of transfer: 01/17/24  Discharge Plan and Services Additional resources added to the After Visit Summary for   In-house Referral: Clinical Social Work   Post Acute Care Choice: Skilled Nursing Facility                               Social Drivers of Health (SDOH) Interventions SDOH Screenings   Food Insecurity: No Food Insecurity (01/15/2024)  Housing: Low Risk  (01/16/2024)  Transportation Needs: No Transportation Needs (01/15/2024)  Utilities: Not At Risk (01/15/2024)  Financial Resource Strain: Low Risk  (08/11/2023)   Received from [facility]  Physical Activity: Unknown (01/13/2023)   Received from [facility]  Social Connections: Unknown (01/16/2024)  Recent Concern: Social Connections - Moderately Isolated (12/12/2023)  Stress: No Stress Concern Present (01/13/2023)   Received from [facility]  Tobacco Use: Low Risk  (01/14/2024)     Readmission Risk Interventions    10/13/2023    9:11  PM  Readmission Risk Prevention Plan  Post Dischage Appt Complete  Medication Screening Complete  Transportation Screening Complete

## 2024-01-17 NOTE — Assessment & Plan Note (Signed)
 01/17/2024 she gave the date as August?,  26  She was able to identify the POTUS.

## 2024-01-17 NOTE — Assessment & Plan Note (Signed)
 Assess response to topical estrogen and the prophylactic Macrodantin .  Urologic assessment to rule out any bladder predisposition (polyp, stone etc.) to recurrent infections should be considered.

## 2024-01-17 NOTE — Progress Notes (Unsigned)
 NURSING HOME LOCATION:  [facility] ROOM NUMBER:  [id number]  CODE STATUS:  Full Code   PCP: [name], NP   This is a nursing facility follow up visit for Nursing Facility readmission within 30 days.  Interim medical record and care since last SNF visit was updated with review of diagnostic studies and change in clinical status since last visit were documented.  HPI: The patient has been admitted to this SNF on 7/4 after being hospitalized 6/30 - 7/4 with altered mental status and generalized weakness.  The acute metabolic encephalopathy was attributed to Enterococcus UTI. She was readmitted 8/2 - 01/17/2024 presenting as code stroke.  While at work she stopped responding.  Neurology consulted; CT and MRI imaging of the head revealed no acute changes.  EEG revealed no seizure activity.  Urine cultures grew E. coli; she had received IV Rocephin  with good clinical response response.  This UTI is at least the third such episode associated with acute encephalopathy in the last several months.  As a preventative measure estrogen cream as well as Macrobid  prophylaxis were prescribed. Albumin was reduced to 3.1; total protein was low normal at 6.5.WBC was normal serially.  Review of systems: Dementia invalidated responses. Date given as August?,  26.  She was able to identify the POTUS.  She did realize the reason for hospitalization stating I had a UTI.  She states she is now better but she seemed to indicate she was having dysuria initially.  Subsequently she said just normal.  The remainder of the review of systems was negative.  Constitutional: No fever, significant weight change, fatigue  Eyes: No redness, discharge, pain, vision change ENT/mouth: No nasal congestion,  purulent discharge, earache, change in hearing, sore throat  Cardiovascular: No chest pain, palpitations, paroxysmal nocturnal dyspnea, claudication, edema  Respiratory: No cough, sputum production,  hemoptysis, DOE, significant snoring, apnea   Gastrointestinal: No heartburn, dysphagia, abdominal pain, nausea /vomiting, rectal bleeding, melena, change in bowels Genitourinary: No dysuria, hematuria, pyuria, incontinence, nocturia Musculoskeletal: No joint stiffness, joint swelling, weakness, pain Dermatologic: No rash, pruritus, change in appearance of skin Neurologic: No dizziness, headache, syncope, seizures, numbness, tingling Psychiatric: No significant anxiety, depression, insomnia, anorexia Endocrine: No change in hair/skin/nails, excessive thirst, excessive hunger, excessive urination  Hematologic/lymphatic: No significant bruising, lymphadenopathy, abnormal bleeding Allergy/immunology: No itchy/watery eyes, significant sneezing, urticaria, angioedema  Physical exam:  Pertinent or positive findings: Upon entering the room she was asleep exhibiting hypopnea, snoring, and frank apnea.  She was easily aroused.  Eyebrows are decreased in density.  The right nasolabial fold is reduced compared to the left.  The mouth sags to the right while asleep.  Heart rhythm and rate are regular and controlled.  Pedal pulses are palpable.  She has trace edema at the sock line.  She has bruising over the right upper extremity.  General appearance: Adequately nourished; no acute distress, increased work of breathing is present.   Lymphatic: No lymphadenopathy about the head, neck, axilla. Eyes: No conjunctival inflammation or lid edema is present. There is no scleral icterus. Ears:  External ear exam shows no significant lesions or deformities.   Nose:  External nasal examination shows no deformity or inflammation. Nasal mucosa are pink and moist without lesions, exudates Oral exam:  Lips and gums are healthy appearing. There is no oropharyngeal erythema or exudate. Neck:  No thyromegaly, masses, tenderness noted.    Heart:  Normal rate and regular rhythm. S1 and S2 normal without gallop,  murmur, click,  rub .  Lungs: Chest clear to auscultation without wheezes, rhonchi, rales, rubs. Abdomen: Bowel sounds are normal. Abdomen is soft and nontender with no organomegaly, hernias, masses. GU: Deferred  Extremities:  No cyanosis, clubbing, edema  Neurologic exam : Cn 2-7 intact Strength equal  in upper & lower extremities Balance, Rhomberg, finger to nose testing could not be completed due to clinical state Deep tendon reflexes are equal Skin: Warm & dry w/o tenting. No significant lesions or rash.  See summary under each active problem in the Problem List with associated updated therapeutic plan

## 2024-01-17 NOTE — Progress Notes (Signed)
 IV removed and report called to [place] at [facility].  Transported by WC to room 119

## 2024-01-17 NOTE — Care Management Important Message (Signed)
 Important Message  Patient Details  Name: Donna Barron MRN: [id number] Date of Birth: 29-Oct-1940   Important Message Given:  N/A - LOS <3 / Initial given by admissions     Byrd Terrero L [name] 01/17/2024, 10:38 AM

## 2024-01-17 NOTE — Assessment & Plan Note (Signed)
 This will be discussed with her daughter and sleep apnea evaluation recommended.

## 2024-01-17 NOTE — Progress Notes (Signed)
 Physical Therapy Treatment Patient Details Name: Donna Barron MRN: [id number] DOB: 1940-12-04 Today's Date: 01/17/2024   History of Present Illness Donna Barron is a 83 y.o. female with medical history significant of dementia, paroxysmal atrial fibrillation on Eliquis , hypertension, GERD, seizure disorder on Trileptal  and Depakote , recurrent UTIs.  Patient has been admitted 3 times in the last couple of months due to acute encephalopathy and recurrent UTIs.  Today, she was at lunch and she stopped responding.  She was brought to the hospital via EMS as a code stroke.  She has remained relatively unresponsive although alert and awake.  No fevers, chills, nausea, vomiting.  Neurology was consulted as part of code stroke.  She had a CT and a CT angiogram of head and neck with perfusion.  All of these were without concern.  She did have a UA that showed a UTI.    PT Comments  Patient agreeable to physical therapy treatment today. Patient continues to demonstrate slow labored movement for completing functional mobility tasks and requires mod/maxA for successful completion. Patient limited to side stepping at the bedside due to generalized weakness and poor standing balance. Patient demonstrates improved ability to follow commands but still requires increased time to initiate movement. Patient left on [facility] following a bowel movement with nursing and daughter present in the room. Patient will benefit from continued skilled physical therapy in hospital and recommended venue below to increase strength, balance, endurance for safe ADLs and gait.     If plan is discharge home, recommend the following: A lot of help with bathing/dressing/bathroom;A lot of help with walking and/or transfers;Help with stairs or ramp for entrance;Assist for transportation   Can travel by private vehicle     Yes  Equipment Recommendations  Rolling walker (2 wheels)    Recommendations for Other Services       Precautions /  Restrictions Precautions Precautions: Fall Recall of Precautions/Restrictions: Impaired Restrictions Weight Bearing Restrictions Per Provider Order: No     Mobility  Bed Mobility Overal bed mobility: Needs Assistance Bed Mobility: Supine to Sit     Supine to sit: Max assist     General bed mobility comments: very slow labored movement; stiff torso with much assist to pull to upright at EOB    Transfers Overall transfer level: Needs assistance Equipment used: Rolling walker (2 wheels) Transfers: Sit to/from Stand, Bed to chair/wheelchair/BSC Sit to Stand: Mod assist, Max assist   Step pivot transfers: Mod assist, Max assist       General transfer comment: labored movement; much extended time; frequent verbal and tactile cuing for steps towards chair.    Ambulation/Gait Ambulation/Gait assistance: Mod assist Gait Distance (Feet): 5 Feet Assistive device: Rolling walker (2 wheels) Gait Pattern/deviations: Decreased step length - right, Decreased step length - left, Decreased stride length Gait velocity: Decreased     General Gait Details: Limited to side steps at bedside; patient better able to follow commands but still requires increased time to initiate movement   Stairs             Wheelchair Mobility     Tilt Bed    Modified Rankin (Stroke Patients Only)       Balance Overall balance assessment: Needs assistance Sitting-balance support: Feet supported, Bilateral upper extremity supported Sitting balance-Leahy Scale: Fair Sitting balance - Comments: fair seated EOB   Standing balance support: Bilateral upper extremity supported, During functional activity, Reliant on assistive device for balance Standing balance-Leahy Scale: Poor Standing balance comment:  using RW                            Communication Communication Communication: No apparent difficulties  Cognition Arousal: Alert Behavior During Therapy: Flat affect   PT -  Cognitive impairments: History of cognitive impairments, Initiation                       PT - Cognition Comments: Very delayed movement Following commands: Impaired Following commands impaired: Follows one step commands with increased time    Cueing Cueing Techniques: Verbal cues, Tactile cues  Exercises      General Comments        Pertinent Vitals/Pain Pain Assessment Pain Assessment: No/denies pain    Home Living                          Prior Function            PT Goals (current goals can now be found in the care plan section) Acute Rehab PT Goals Patient Stated Goal: return home PT Goal Formulation: With patient Time For Goal Achievement: 01/30/24 Potential to Achieve Goals: Fair Progress towards PT goals: Progressing toward goals    Frequency           PT Plan      Co-evaluation     PT goals addressed during session: Mobility/safety with mobility;Balance;Proper use of DME        AM-PAC PT 6 Clicks Mobility   Outcome Measure  Help needed turning from your back to your side while in a flat bed without using bedrails?: A Little Help needed moving from lying on your back to sitting on the side of a flat bed without using bedrails?: A Lot Help needed moving to and from a bed to a chair (including a wheelchair)?: A Lot Help needed standing up from a chair using your arms (e.g., wheelchair or bedside chair)?: A Lot Help needed to walk in hospital room?: A Lot Help needed climbing 3-5 steps with a railing? : Total 6 Click Score: 12    End of Session Equipment Utilized During Treatment: Gait belt Activity Tolerance: Patient tolerated treatment well Patient left: with nursing/sitter in room;Other (comment);with family/visitor present (on [facility] with nursing and family member present) Nurse Communication: Mobility status PT Visit Diagnosis: Unsteadiness on feet (R26.81);Muscle weakness (generalized) (M62.81);Other abnormalities of  gait and mobility (R26.89)     Time: 9144-9069 PT Time Calculation (min) (ACUTE ONLY): 35 min  Charges:    $Therapeutic Activity: 23-37 mins PT General Charges $$ ACUTE PT VISIT: 1 Visit                     11:25 AM, 01/17/24,  Onnie Como, SPT

## 2024-01-18 LAB — OXCARBAZEPINE (TRILEPTAL), SERUM: Oxcarbazepine Metabolite: 5 ug/mL — ABNORMAL LOW (ref 10–35)

## 2024-01-19 LAB — PHENYTOIN LEVEL, FREE AND TOTAL: Phenytoin, Total: 0.8 ug/mL — ABNORMAL LOW (ref 10.0–20.0)

## 2024-01-19 LAB — GLUCOSE, CAPILLARY: Glucose-Capillary: 186 mg/dL — ABNORMAL HIGH (ref 70–99)

## 2024-01-30 ENCOUNTER — Other Ambulatory Visit: Payer: Self-pay | Admitting: Internal Medicine

## 2024-02-06 ENCOUNTER — Encounter: Payer: Self-pay | Admitting: Adult Health

## 2024-02-06 ENCOUNTER — Other Ambulatory Visit: Payer: Self-pay | Admitting: Adult Health

## 2024-02-06 ENCOUNTER — Other Ambulatory Visit (HOSPITAL_COMMUNITY)
Admission: RE | Admit: 2024-02-06 | Discharge: 2024-02-06 | Disposition: A | Discharge status: Home / Self Care | Source: Skilled Nursing Facility | Attending: Adult Health | Admitting: Adult Health

## 2024-02-06 ENCOUNTER — Non-Acute Institutional Stay (SKILLED_NURSING_FACILITY): Payer: Self-pay | Admitting: Adult Health

## 2024-02-06 DIAGNOSIS — G40909 Epilepsy, unspecified, not intractable, without status epilepticus: Secondary | ICD-10-CM

## 2024-02-06 DIAGNOSIS — F0390 Unspecified dementia without behavioral disturbance: Secondary | ICD-10-CM

## 2024-02-06 DIAGNOSIS — G40209 Localization-related (focal) (partial) symptomatic epilepsy and epileptic syndromes with complex partial seizures, not intractable, without status epilepticus: Secondary | ICD-10-CM

## 2024-02-06 DIAGNOSIS — G9341 Metabolic encephalopathy: Secondary | ICD-10-CM

## 2024-02-06 DIAGNOSIS — R569 Unspecified convulsions: Secondary | ICD-10-CM | POA: Insufficient documentation

## 2024-02-06 LAB — VALPROIC ACID LEVEL: Valproic Acid Lvl: 21 ug/mL — ABNORMAL LOW (ref 50–100)

## 2024-02-06 MED ORDER — METOPROLOL SUCCINATE ER 50 MG PO TB24: 75.0000 mg | ORAL_TABLET | Freq: Every day | ORAL | 0 refills | Status: DC

## 2024-02-06 MED ORDER — ESCITALOPRAM OXALATE 20 MG PO TABS: 20.0000 mg | ORAL_TABLET | Freq: Every day | ORAL | 0 refills | Status: DC

## 2024-02-06 MED ORDER — NITROFURANTOIN MONOHYD MACRO 100 MG PO CAPS: 100.0000 mg | ORAL_CAPSULE | Freq: Every day | ORAL | 0 refills | Status: DC

## 2024-02-06 MED ORDER — NYSTATIN 100000 UNIT/GM EX POWD: 1.0000 | Freq: Two times a day (BID) | CUTANEOUS | 0 refills | Status: DC | PRN

## 2024-02-06 MED ORDER — POTASSIUM CHLORIDE CRYS ER 20 MEQ PO TBCR: 20.0000 meq | EXTENDED_RELEASE_TABLET | Freq: Every day | ORAL | 0 refills | Status: DC

## 2024-02-06 MED ORDER — SIMVASTATIN 40 MG PO TABS: 40.0000 mg | ORAL_TABLET | Freq: Every day | ORAL | 0 refills | Status: DC

## 2024-02-06 MED ORDER — OMEPRAZOLE 40 MG PO CPDR
40.0000 mg | DELAYED_RELEASE_CAPSULE | Freq: Every day | ORAL | 0 refills | Status: DC
Start: 2024-02-06 — End: 2024-03-15

## 2024-02-06 MED ORDER — DIVALPROEX SODIUM 125 MG PO CSDR: 250.0000 mg | DELAYED_RELEASE_CAPSULE | Freq: Two times a day (BID) | ORAL | 0 refills | Status: DC

## 2024-02-06 MED ORDER — APIXABAN 5 MG PO TABS: 5.0000 mg | ORAL_TABLET | Freq: Two times a day (BID) | ORAL | 0 refills | Status: DC

## 2024-02-06 MED ORDER — FUROSEMIDE 40 MG PO TABS: 40.0000 mg | ORAL_TABLET | Freq: Every day | ORAL | 0 refills | Status: DC

## 2024-02-06 MED ORDER — OXCARBAZEPINE 150 MG PO TABS: 150.0000 mg | ORAL_TABLET | Freq: Two times a day (BID) | ORAL | 0 refills | Status: DC

## 2024-02-06 MED ORDER — DIVALPROEX SODIUM 125 MG PO CSDR: 250.0000 mg | DELAYED_RELEASE_CAPSULE | Freq: Every evening | ORAL | 0 refills | Status: DC

## 2024-02-06 NOTE — Progress Notes (Unsigned)
 Location:  [facility] Room Number: 130 Place of Service:  SNF (31)   CODE STATUS: full code   Allergies  Allergen Reactions   Codeine  Swelling    Swelling of lips   Hydrocodone  Swelling    Lip swelling   Avelox [Moxifloxacin Hcl In Nacl] Other (See Comments)    Insomnia    Oxybutynin Palpitations and Other (See Comments)    Tongue felt coated     Chief Complaint  Patient presents with   Discharge Note    HPI:  She is being discharged to home with home health for pt/ot. She will not need any dme. She will need her prescriptions written and will need to follow up with her medical provider. She had been hospitalized. She had been hospitalized for acute metabolic encephalopathy. She was admitted to this facility for short term rehab: she can ambulate 30 feet with rolling walker with min assist; upper body min assist with lower body max assist; brp: max assist. BCAT 19/50. She has had a seizure today lasting about 5 minutes. She recently had her depakote  changed to sprinkles nightly. This will be increased to twice daily. Her depakote  level is not therapeutic.   Past Medical History:  Diagnosis Date   Anxiety    Cataract    Colon polyps    Dementia (HCC)    Depression    Dysrhythmia    heart palipations   Gallstones    GERD (gastroesophageal reflux disease)    HTN (hypertension)    Hypercholesteremia    PONV (postoperative nausea and vomiting)     Past Surgical History:  Procedure Laterality Date   APPENDECTOMY     ARTHROSCOPIC REPAIR ACL  05/2008   Left knee   CARPAL TUNNEL RELEASE Right 07/14/2017   Procedure: CARPAL TUNNEL RELEASE;  Surgeon: Margrette Taft BRAVO, MD;  Location: [facility];  Service: Orthopedics;  Laterality: Right;   CATARACT EXTRACTION, BILATERAL  2020   CHOLECYSTECTOMY     DILATION AND CURETTAGE OF UTERUS     DORSAL COMPARTMENT RELEASE Right 07/14/2017   Procedure: RELEASE DORSAL COMPARTMENT (DEQUERVAIN);  Surgeon: Margrette Taft BRAVO, MD;  Location: [facility];  Service: Orthopedics;  Laterality: Right;   HIATAL HERNIA REPAIR N/A 03/24/2020   Procedure: LAPAROSCOPIC REPAIR OF HIATAL HERNIA, nissen fundoplication;  Surgeon: Gladis Cough, MD;  Location: [facility];  Service: General;  Laterality: N/A;   HYSTEROSCOPY WITH D & C N/A 12/25/2013   Procedure: DILATATION AND CURETTAGE /HYSTEROSCOPY;  Surgeon: Peggye Gull, MD;  Location: [facility];  Service: Gynecology;  Laterality: N/A;   SHOULDER OPEN ROTATOR CUFF REPAIR Left 06/15/2018   Procedure: ROTATOR CUFF REPAIR SHOULDER OPEN WITH ACROMIOPLASTY;  Surgeon: Margrette Taft BRAVO, MD;  Location: [facility];  Service: Orthopedics;  Laterality: Left;   TUBAL LIGATION      Social History   Socioeconomic History   Marital status: Widowed    Spouse name: Not on file   Number of children: 4   Years of education: Not on file   Highest education level: Not on file  Occupational History   Occupation: retired    Associate Professor: OTHER  Tobacco Use   Smoking status: Never   Smokeless tobacco: Never  Vaping Use   Vaping status: Never Used  Substance and Sexual Activity   Alcohol use: Not Currently   Drug use: No   Sexual activity: Not Currently  Other Topics Concern   Not on file  Social History Narrative   Are  you right handed or left handed? Right    Are you currently employed ? no   What is your current occupation?   Do you live at home alone? No    Who lives with you? daughter   What type of home do you live in: 1 story or 2 story? 1 story        Social Drivers of Corporate investment banker Strain: Low Risk  (08/11/2023)   Received from [facility]   Overall Financial Resource Strain (CARDIA)    Difficulty of Paying Living Expenses: Not very hard  Food Insecurity: No Food Insecurity (01/15/2024)   Hunger Vital Sign    Worried About Running Out of Food in the Last Year: Never true    Ran Out of Food in the Last Year: Never true  Transportation Needs: No Transportation  Needs (01/15/2024)   PRAPARE - Administrator, Civil Service (Medical): No    Lack of Transportation (Non-Medical): No  Physical Activity: Unknown (01/13/2023)   Received from [facility]   Exercise Vital Sign    On average, how many days per week do you engage in moderate to strenuous exercise (like a brisk walk)?: 0 days    Minutes of Exercise per Session: Not on file  Stress: No Stress Concern Present (01/13/2023)   Received from [facility] - Occupational Stress Questionnaire    Feeling of Stress : Only a little  Social Connections: Unknown (01/16/2024)   Social Connection and Isolation Panel    Frequency of Communication with Friends and Family: Patient unable to answer    Frequency of Social Gatherings with Friends and Family: Patient unable to answer    Attends Religious Services: Patient unable to answer    Active Member of Clubs or Organizations: No    Attends Banker Meetings: Patient unable to answer    Marital Status: Widowed  Recent Concern: Social Connections - Moderately Isolated (12/12/2023)   Social Connection and Isolation Panel    Frequency of Communication with Friends and Family: Once a week    Frequency of Social Gatherings with Friends and Family: Once a week    Attends Religious Services: 1 to 4 times per year    Active Member of [employer] or Organizations: No    Attends Banker Meetings: 1 to 4 times per year    Marital Status: Widowed  Intimate Partner Violence: Not At Risk (01/15/2024)   Humiliation, Afraid, Rape, and Kick questionnaire    Fear of Current or Ex-Partner: No    Emotionally Abused: No    Physically Abused: No    Sexually Abused: No   Family History  Problem Relation Age of Onset   Heart failure Mother 45   Lung cancer Father 38   Breast cancer Sister    Diabetes Sister    Colon cancer Brother    Colon polyps Son    Colon polyps Daughter       VITAL SIGNS BP  98/64   Pulse 70   Temp (!) 97 F (36.1 C)   Resp 20   Ht 5' 2 (1.575 m)   Wt 181 lb 6.4 oz (82.3 kg)   SpO2 96%   BMI 33.18 kg/m   Outpatient Encounter Medications as of 02/06/2024  Medication Sig   apixaban  (ELIQUIS ) 5 MG TABS tablet Take 1 tablet (5 mg total) by mouth 2 (two) times daily.   bisacodyl  (DULCOLAX) 5  MG EC tablet Take 1 tablet (5 mg total) by mouth daily as needed for moderate constipation.   conjugated estrogens  (PREMARIN ) 0.625 MG/GM vaginal cream Place 0.25 Applicatorfuls vaginally daily.   Depakote  sprinkles 250 mg twice daily    escitalopram  (LEXAPRO ) 20 MG tablet Take 1 tablet (20 mg total) by mouth at bedtime.   furosemide  (LASIX ) 40 MG tablet Take 1 tablet (40 mg total) by mouth daily.   melatonin 5 MG TABS Take 5 mg by mouth at bedtime.   metoprolol  succinate (TOPROL -XL) 50 MG 24 hr tablet Take 1.5 tablets (75 mg total) by mouth daily.   nitrofurantoin , macrocrystal-monohydrate, (MACROBID ) 100 MG capsule Take 1 capsule (100 mg total) by mouth at bedtime.   nystatin  powder Apply 1 Application topically 2 (two) times daily as needed.   omeprazole  (PRILOSEC) 40 MG capsule Take 1 capsule (40 mg total) by mouth daily.   OXcarbazepine  (TRILEPTAL ) 150 MG tablet Take 1 tablet (150 mg total) by mouth 2 (two) times daily.   potassium chloride  SA (KLOR-CON  M) 20 MEQ tablet Take 1 tablet (20 mEq total) by mouth daily.   simvastatin  (ZOCOR ) 40 MG tablet Take 1 tablet (40 mg total) by mouth at bedtime.   Vitamin D , Ergocalciferol , (DRISDOL ) 1.25 MG (50000 UNIT) CAPS capsule Take 1 capsule (50,000 Units total) by mouth once a week. (Patient taking differently: Take 50,000 Units by mouth every Tuesday.)   [DISCONTINUED] divalproex  (DEPAKOTE  SPRINKLE) 125 MG capsule Take 2 capsules (250 mg total) by mouth at bedtime.   Facility-Administered Encounter Medications as of 02/06/2024  Medication   fentaNYL  (SUBLIMAZE ) injection 25-50 mcg     SIGNIFICANT DIAGNOSTIC  EXAMS  Review of Systems  Reason unable to perform ROS: is post seizure.   Physical Exam Constitutional:      General: She is not in acute distress.    Appearance: She is well-developed. She is not diaphoretic.  Neck:     Thyroid : No thyromegaly.  Cardiovascular:     Rate and Rhythm: Normal rate and regular rhythm.     Pulses: Normal pulses.     Heart sounds: Normal heart sounds.  Pulmonary:     Effort: Pulmonary effort is normal. No respiratory distress.     Breath sounds: Normal breath sounds.  Abdominal:     General: Bowel sounds are normal. There is no distension.     Palpations: Abdomen is soft.     Tenderness: There is no abdominal tenderness.  Musculoskeletal:        General: Normal range of motion.     Cervical back: Neck supple.     Right lower leg: No edema.     Left lower leg: No edema.  Lymphadenopathy:     Cervical: No cervical adenopathy.  Skin:    General: Skin is warm and dry.  Neurological:     Mental Status: She is alert. Mental status is at baseline.  Psychiatric:        Mood and Affect: Mood normal.     ASSESSMENT/ PLAN:   Patient is being discharged with the following home health services:  pt/ot to evaluate and treat as indicated for gait balance strength adl training.   Patient is being discharged with the following durable medical equipment:  none needed   Patient has been advised to f/u with their PCP in 1-2 weeks to for a transitions of care visit.  Social services at their facility was responsible for arranging this appointment.  Pt was provided with adequate prescriptions of  noncontrolled medications to reach the scheduled appointment .  For controlled substances, a limited supply was provided as appropriate for the individual patient.  If the pt normally receives these medications from a pain clinic or has a contract with another physician, these medications should be received from that clinic or physician only).    A 30 day supply of her  prescription medications have been sent to [facility] in [place]  Time spent with patient: 40 minutes: medications; home health; dme.    Barnie Seip NP [facility]   call [phone]

## 2024-02-26 ENCOUNTER — Inpatient Hospital Stay (HOSPITAL_COMMUNITY)
Admission: EM | Admit: 2024-02-26 | Discharge: 2024-03-05 | DRG: 689 | Disposition: A | Discharge status: Skilled Nursing Facility | Attending: Internal Medicine | Admitting: Internal Medicine

## 2024-02-26 ENCOUNTER — Other Ambulatory Visit: Payer: Self-pay

## 2024-02-26 ENCOUNTER — Encounter (HOSPITAL_COMMUNITY): Payer: Self-pay | Admitting: Emergency Medicine

## 2024-02-26 ENCOUNTER — Emergency Department (HOSPITAL_COMMUNITY)

## 2024-02-26 DIAGNOSIS — I10 Essential (primary) hypertension: Secondary | ICD-10-CM | POA: Diagnosis present

## 2024-02-26 DIAGNOSIS — A419 Sepsis, unspecified organism: Secondary | ICD-10-CM | POA: Diagnosis not present

## 2024-02-26 DIAGNOSIS — Z881 Allergy status to other antibiotic agents status: Secondary | ICD-10-CM

## 2024-02-26 DIAGNOSIS — Z803 Family history of malignant neoplasm of breast: Secondary | ICD-10-CM | POA: Diagnosis not present

## 2024-02-26 DIAGNOSIS — N390 Urinary tract infection, site not specified: Principal | ICD-10-CM | POA: Diagnosis present

## 2024-02-26 DIAGNOSIS — B965 Pseudomonas (aeruginosa) (mallei) (pseudomallei) as the cause of diseases classified elsewhere: Secondary | ICD-10-CM | POA: Diagnosis present

## 2024-02-26 DIAGNOSIS — I480 Paroxysmal atrial fibrillation: Secondary | ICD-10-CM | POA: Diagnosis present

## 2024-02-26 DIAGNOSIS — Z79899 Other long term (current) drug therapy: Secondary | ICD-10-CM | POA: Diagnosis not present

## 2024-02-26 DIAGNOSIS — R7881 Bacteremia: Secondary | ICD-10-CM | POA: Diagnosis present

## 2024-02-26 DIAGNOSIS — Z7901 Long term (current) use of anticoagulants: Secondary | ICD-10-CM

## 2024-02-26 DIAGNOSIS — Z801 Family history of malignant neoplasm of trachea, bronchus and lung: Secondary | ICD-10-CM

## 2024-02-26 DIAGNOSIS — E66811 Obesity, class 1: Secondary | ICD-10-CM | POA: Diagnosis present

## 2024-02-26 DIAGNOSIS — Z885 Allergy status to narcotic agent status: Secondary | ICD-10-CM | POA: Diagnosis not present

## 2024-02-26 DIAGNOSIS — E7220 Disorder of urea cycle metabolism, unspecified: Secondary | ICD-10-CM | POA: Diagnosis present

## 2024-02-26 DIAGNOSIS — Z66 Do not resuscitate: Secondary | ICD-10-CM | POA: Diagnosis present

## 2024-02-26 DIAGNOSIS — F419 Anxiety disorder, unspecified: Secondary | ICD-10-CM | POA: Diagnosis present

## 2024-02-26 DIAGNOSIS — Z8249 Family history of ischemic heart disease and other diseases of the circulatory system: Secondary | ICD-10-CM

## 2024-02-26 DIAGNOSIS — A4150 Gram-negative sepsis, unspecified: Secondary | ICD-10-CM | POA: Diagnosis not present

## 2024-02-26 DIAGNOSIS — F0390 Unspecified dementia without behavioral disturbance: Secondary | ICD-10-CM | POA: Diagnosis present

## 2024-02-26 DIAGNOSIS — Z1152 Encounter for screening for COVID-19: Secondary | ICD-10-CM | POA: Diagnosis not present

## 2024-02-26 DIAGNOSIS — G9341 Metabolic encephalopathy: Secondary | ICD-10-CM | POA: Diagnosis present

## 2024-02-26 DIAGNOSIS — Z7189 Other specified counseling: Secondary | ICD-10-CM | POA: Diagnosis not present

## 2024-02-26 DIAGNOSIS — G40909 Epilepsy, unspecified, not intractable, without status epilepticus: Secondary | ICD-10-CM | POA: Diagnosis present

## 2024-02-26 DIAGNOSIS — Z6833 Body mass index (BMI) 33.0-33.9, adult: Secondary | ICD-10-CM | POA: Diagnosis not present

## 2024-02-26 DIAGNOSIS — E782 Mixed hyperlipidemia: Secondary | ICD-10-CM | POA: Diagnosis present

## 2024-02-26 DIAGNOSIS — Z800 Family history of malignant neoplasm of digestive organs: Secondary | ICD-10-CM | POA: Diagnosis not present

## 2024-02-26 DIAGNOSIS — K219 Gastro-esophageal reflux disease without esophagitis: Secondary | ICD-10-CM | POA: Diagnosis present

## 2024-02-26 DIAGNOSIS — Z833 Family history of diabetes mellitus: Secondary | ICD-10-CM | POA: Diagnosis not present

## 2024-02-26 DIAGNOSIS — N3281 Overactive bladder: Secondary | ICD-10-CM | POA: Diagnosis not present

## 2024-02-26 LAB — CBC WITH DIFFERENTIAL/PLATELET
Abs Immature Granulocytes: 0.02 K/uL (ref 0.00–0.07)
Basophils Absolute: 0 K/uL (ref 0.0–0.1)
Basophils Relative: 0 %
Eosinophils Absolute: 0 K/uL (ref 0.0–0.5)
Eosinophils Relative: 0 %
HCT: 47.3 % — ABNORMAL HIGH (ref 36.0–46.0)
Hemoglobin: 15.7 g/dL — ABNORMAL HIGH (ref 12.0–15.0)
Immature Granulocytes: 0 %
Lymphocytes Relative: 24 %
Lymphs Abs: 1.9 K/uL (ref 0.7–4.0)
MCH: 30.8 pg (ref 26.0–34.0)
MCHC: 33.2 g/dL (ref 30.0–36.0)
MCV: 92.9 fL (ref 80.0–100.0)
Monocytes Absolute: 0.7 K/uL (ref 0.1–1.0)
Monocytes Relative: 9 %
Neutro Abs: 5.2 K/uL (ref 1.7–7.7)
Neutrophils Relative %: 67 %
Platelets: 285 K/uL (ref 150–400)
RBC: 5.09 MIL/uL (ref 3.87–5.11)
RDW: 13.5 % (ref 11.5–15.5)
WBC: 7.8 K/uL (ref 4.0–10.5)
nRBC: 0 % (ref 0.0–0.2)

## 2024-02-26 LAB — COMPREHENSIVE METABOLIC PANEL WITH GFR
ALT: 9 U/L (ref 0–44)
AST: 17 U/L (ref 15–41)
Albumin: 3.3 g/dL — ABNORMAL LOW (ref 3.5–5.0)
Alkaline Phosphatase: 83 U/L (ref 38–126)
Anion gap: 10 (ref 5–15)
BUN: 10 mg/dL (ref 8–23)
CO2: 25 mmol/L (ref 22–32)
Calcium: 8.5 mg/dL — ABNORMAL LOW (ref 8.9–10.3)
Chloride: 94 mmol/L — ABNORMAL LOW (ref 98–111)
Creatinine, Ser: 0.7 mg/dL (ref 0.44–1.00)
GFR, Estimated: >60 mL/min (ref >60)
Glucose, Bld: 95 mg/dL (ref 70–99)
Potassium: 3.6 mmol/L (ref 3.5–5.1)
Sodium: 129 mmol/L — ABNORMAL LOW (ref 135–145)
Total Bilirubin: 0.5 mg/dL (ref 0.0–1.2)
Total Protein: 6.9 g/dL (ref 6.5–8.1)

## 2024-02-26 LAB — URINALYSIS, ROUTINE W REFLEX MICROSCOPIC
Bacteria, UA: NONE SEEN
Bilirubin Urine: NEGATIVE
Glucose, UA: NEGATIVE mg/dL
Ketones, ur: 5 mg/dL — AB
Nitrite: NEGATIVE
Protein, ur: 30 mg/dL — AB
Specific Gravity, Urine: 1.016 (ref 1.005–1.030)
WBC, UA: >50 WBC/hpf (ref 0–5)
pH: 7 (ref 5.0–8.0)

## 2024-02-26 LAB — PROTIME-INR
INR: 1.2 (ref 0.8–1.2)
Prothrombin Time: 15.6 s — ABNORMAL HIGH (ref 11.4–15.2)

## 2024-02-26 LAB — RESP PANEL BY RT-PCR (RSV, FLU A&B, COVID)  RVPGX2
Influenza A by PCR: NEGATIVE
Influenza B by PCR: NEGATIVE
Resp Syncytial Virus by PCR: NEGATIVE
SARS Coronavirus 2 by RT PCR: NEGATIVE

## 2024-02-26 LAB — LACTIC ACID, PLASMA
Lactic Acid, Venous: 1.3 mmol/L (ref 0.5–1.9)
Lactic Acid, Venous: 1.4 mmol/L (ref 0.5–1.9)

## 2024-02-26 MED ORDER — APIXABAN 5 MG PO TABS
5.0000 mg | ORAL_TABLET | Freq: Two times a day (BID) | ORAL | Status: DC
  Administered 2024-02-26 – 2024-03-05 (×16): 5 mg via ORAL
  Filled 2024-02-26 (×16): qty 1

## 2024-02-26 MED ORDER — ONDANSETRON HCL 4 MG/2ML IJ SOLN: 4.0000 mg | Freq: Four times a day (QID) | INTRAMUSCULAR | Status: DC | PRN

## 2024-02-26 MED ORDER — OXCARBAZEPINE 150 MG PO TABS
150.0000 mg | ORAL_TABLET | Freq: Two times a day (BID) | ORAL | Status: DC
Start: 2024-02-26 — End: 2024-03-02
  Administered 2024-02-26 – 2024-03-02 (×9): 150 mg via ORAL
  Filled 2024-02-26 (×14): qty 1

## 2024-02-26 MED ORDER — ONDANSETRON HCL 4 MG PO TABS: 4.0000 mg | ORAL_TABLET | Freq: Four times a day (QID) | ORAL | Status: DC | PRN

## 2024-02-26 MED ORDER — SIMVASTATIN 20 MG PO TABS
40.0000 mg | ORAL_TABLET | Freq: Every day | ORAL | Status: DC
  Administered 2024-02-26 – 2024-03-04 (×8): 40 mg via ORAL
  Filled 2024-02-26 (×3): qty 2
  Filled 2024-02-26: qty 4
  Filled 2024-02-26 (×4): qty 2

## 2024-02-26 MED ORDER — DEXTROSE-SODIUM CHLORIDE 5-0.9 % IV SOLN: INTRAVENOUS | Status: DC

## 2024-02-26 MED ORDER — SODIUM CHLORIDE 0.9 % IV BOLUS
2000.0000 mL | Freq: Once | INTRAVENOUS | Status: AC
  Administered 2024-02-26: 2000 mL via INTRAVENOUS

## 2024-02-26 MED ORDER — PANTOPRAZOLE SODIUM 40 MG PO TBEC
40.0000 mg | DELAYED_RELEASE_TABLET | Freq: Every day | ORAL | Status: DC
  Administered 2024-02-26 – 2024-03-05 (×7): 40 mg via ORAL
  Filled 2024-02-26 (×9): qty 1

## 2024-02-26 MED ORDER — DIVALPROEX SODIUM 125 MG PO CSDR
250.0000 mg | DELAYED_RELEASE_CAPSULE | Freq: Two times a day (BID) | ORAL | Status: DC
Start: 2024-02-26 — End: 2024-03-03
  Administered 2024-02-26 – 2024-03-03 (×11): 250 mg via ORAL
  Filled 2024-02-26 (×12): qty 2

## 2024-02-26 MED ORDER — METOPROLOL SUCCINATE ER 50 MG PO TB24
75.0000 mg | ORAL_TABLET | Freq: Every day | ORAL | Status: DC
Start: 2024-02-26 — End: 2024-02-28
  Administered 2024-02-26 – 2024-02-28 (×3): 75 mg via ORAL
  Filled 2024-02-26 (×3): qty 1

## 2024-02-26 MED ORDER — ESCITALOPRAM OXALATE 10 MG PO TABS
20.0000 mg | ORAL_TABLET | Freq: Every day | ORAL | Status: DC
  Administered 2024-02-26 – 2024-03-04 (×8): 20 mg via ORAL
  Filled 2024-02-26 (×8): qty 2

## 2024-02-26 MED ORDER — SODIUM CHLORIDE 0.9 % IV SOLN
2.0000 g | Freq: Once | INTRAVENOUS | Status: AC
  Administered 2024-02-26: 2 g via INTRAVENOUS
  Filled 2024-02-26: qty 20

## 2024-02-26 MED ORDER — ACETAMINOPHEN 325 MG PO TABS
650.0000 mg | ORAL_TABLET | Freq: Four times a day (QID) | ORAL | Status: DC | PRN
  Administered 2024-03-04: 650 mg via ORAL
  Filled 2024-02-26: qty 2

## 2024-02-26 MED ORDER — ACETAMINOPHEN 650 MG RE SUPP: 650.0000 mg | Freq: Four times a day (QID) | RECTAL | Status: DC | PRN

## 2024-02-26 MED ORDER — SODIUM CHLORIDE 0.9 % IV SOLN: 1.0000 g | INTRAVENOUS | Status: DC

## 2024-02-26 MED ORDER — MELATONIN 3 MG PO TABS
3.0000 mg | ORAL_TABLET | Freq: Every day | ORAL | Status: DC
  Administered 2024-02-26 – 2024-03-04 (×8): 3 mg via ORAL
  Filled 2024-02-26 (×8): qty 1

## 2024-02-26 NOTE — Assessment & Plan Note (Signed)
 Continue statin therapy

## 2024-02-26 NOTE — Progress Notes (Signed)
 Elink following for sepsis protocol.

## 2024-02-26 NOTE — Assessment & Plan Note (Signed)
 Patient not at her baseline, encephalopathy due to urine infection.  Plan to continue supportive medical care with IV fluids, IV antibiotics and neuro checks per unit protocol Will need PT and OT

## 2024-02-26 NOTE — Assessment & Plan Note (Addendum)
 Continue blood pressure monitoring Hold on furosemide  and continue with metoprolol .

## 2024-02-26 NOTE — ED Notes (Signed)
 Pt care taken, resting, no complaints at this time. Has 2 liters of NS infusing and Rocephin  .

## 2024-02-26 NOTE — ED Triage Notes (Signed)
 Per [facility] pt coming from home- recently discharged from rehab facility. Family called ems for unresponsiveness and strong odor to urine since yesterday. Patient answering questions on arrival however delayed responses. Alert to self and location.

## 2024-02-26 NOTE — Assessment & Plan Note (Signed)
Calculated BMI is 33.1

## 2024-02-26 NOTE — Assessment & Plan Note (Signed)
 No signs of active seizures, continue with oxcarbazepine , and divalproex .  Neuro checks per unit protocol.

## 2024-02-26 NOTE — Consult Note (Signed)
 CODE SEPSIS - PHARMACY COMMUNICATION  **Broad Spectrum Antibiotics should be administered within 1 hour of Sepsis diagnosis**  Time Code Sepsis Called/Page Received: 1805  Antibiotics Ordered: ceftriaxone   Time of 1st antibiotic administration: 1824  Additional action taken by pharmacy: N/A   Kayla JULIANNA Blew ,PharmD Clinical Pharmacist  02/26/2024  6:12 PM

## 2024-02-26 NOTE — Assessment & Plan Note (Signed)
 Continue metoprolol  for rate control and anticoagulation with apixaban .  Continue telemetry monitoring

## 2024-02-26 NOTE — Assessment & Plan Note (Addendum)
 Severe sepsis with tachypnea, hypotension and acute metabolic encephalopathy, (present on admission)  Recurrent urinary tract infections.  Last urine culture from 01/2024 positive for E coli sensitive to ceftriaxone , but resistant to ampicillin and ciprofloxacin, culture from 11/2023 was positive for VRE.  Urine infection due to drug resistant gram negative bacteria.   Plan to continue antibiotic therapy with IV ceftriaxone  IV fluids with isotonic saline with dextrose  at 75 ml per hr Continue close follow up on cultures, cell count and temperature curve.

## 2024-02-26 NOTE — H&P (Signed)
 History and Physical    PatientBETHA JESSEKA Barron FMW:[id number] DOB: 10/17/1940 DOA: 02/26/2024 DOS: the patient was seen and examined on 02/26/2024 PCP: [name], NP  Patient coming from: Home  Chief Complaint:  Chief Complaint  Patient presents with   Dysuria   HPI: Donna Barron is a 83 y.o. female with medical history significant of depression, dementia, paroxysmal atrial fibrillation, seizures, GERD, hypertension, and hyperlipidemia who presented with altered mental status.  Recent hospitalization 08/02 to 01/17/24 for urinary tract infection, complicated with acute metabolic encephalopathy. She was diagnosed with E Coli urine infection and was treated with ceftriaxone , placed on prophylactic nitrofurantoin  and estrogen vaginal cream for recurrent urinary tract infections and was discharged to SNF.  Eventually patient was discharged home and was recovering well with home health services.  09/08 had outpatient follow up with primary care with no major changes to her medications.   This time she was noted by her daughter to have a mild change in her mentation about 3 days ago, being less responsive and having poor appetite. The following day she had generalized weakness and was difficult to transfer out of the bed, her mentation continue to worsen. Her urine had a strong odor. Today she was completely dependent to be moved. Because rapid progressive of her symptoms her daughter called EMS and she was brought her to the ED for further evaluation.   At the time of my examination she is able to respond simple questions, most of the information is from her daughter at the bedside  At home uses walker for ambulation.   Review of Systems: unable to review all systems due to the inability of the patient to answer questions. Past Medical History:  Diagnosis Date   Anxiety    Cataract    Colon polyps    Dementia (HCC)    Depression    Dysrhythmia    heart palipations   Gallstones     GERD (gastroesophageal reflux disease)    HTN (hypertension)    Hypercholesteremia    PONV (postoperative nausea and vomiting)    Past Surgical History:  Procedure Laterality Date   APPENDECTOMY     ARTHROSCOPIC REPAIR ACL  05/2008   Left knee   CARPAL TUNNEL RELEASE Right 07/14/2017   Procedure: CARPAL TUNNEL RELEASE;  Surgeon: Margrette Taft BRAVO, MD;  Location: [facility];  Service: Orthopedics;  Laterality: Right;   CATARACT EXTRACTION, BILATERAL  2020   CHOLECYSTECTOMY     DILATION AND CURETTAGE OF UTERUS     DORSAL COMPARTMENT RELEASE Right 07/14/2017   Procedure: RELEASE DORSAL COMPARTMENT (DEQUERVAIN);  Surgeon: Margrette Taft BRAVO, MD;  Location: [facility];  Service: Orthopedics;  Laterality: Right;   HIATAL HERNIA REPAIR N/A 03/24/2020   Procedure: LAPAROSCOPIC REPAIR OF HIATAL HERNIA, nissen fundoplication;  Surgeon: Gladis Cough, MD;  Location: [facility];  Service: General;  Laterality: N/A;   HYSTEROSCOPY WITH D & C N/A 12/25/2013   Procedure: DILATATION AND CURETTAGE /HYSTEROSCOPY;  Surgeon: Peggye Gull, MD;  Location: [facility];  Service: Gynecology;  Laterality: N/A;   SHOULDER OPEN ROTATOR CUFF REPAIR Left 06/15/2018   Procedure: ROTATOR CUFF REPAIR SHOULDER OPEN WITH ACROMIOPLASTY;  Surgeon: Margrette Taft BRAVO, MD;  Location: [facility];  Service: Orthopedics;  Laterality: Left;   TUBAL LIGATION     Social History:  reports that she has never smoked. She has never used smokeless tobacco. She reports that she does not currently use alcohol. She reports that she does not use  drugs.  Allergies  Allergen Reactions   Codeine  Swelling    Swelling of lips   Hydrocodone  Swelling    Lip swelling   Avelox [Moxifloxacin Hcl In Nacl] Other (See Comments)    Insomnia    Oxybutynin Palpitations and Other (See Comments)    Tongue felt coated     Family History  Problem Relation Age of Onset   Heart failure Mother 30   Lung cancer Father 3   Breast cancer Sister    Diabetes Sister     Colon cancer Brother    Colon polyps Son    Colon polyps Daughter     Prior to Admission medications   Medication Sig Start Date End Date Taking? Authorizing Provider  apixaban  (ELIQUIS ) 5 MG TABS tablet Take 1 tablet (5 mg total) by mouth 2 (two) times daily. 02/06/24   [name], NP  bisacodyl  (DULCOLAX) 5 MG EC tablet Take 1 tablet (5 mg total) by mouth daily as needed for moderate constipation. 10/17/23 10/16/24  Maree, Pratik D, DO  conjugated estrogens  (PREMARIN ) 0.625 MG/GM vaginal cream Place 0.25 Applicatorfuls vaginally daily. 01/17/24 01/16/25  Maree, Pratik D, DO  divalproex  (DEPAKOTE  SPRINKLE) 125 MG capsule Take 2 capsules (250 mg total) by mouth 2 (two) times daily. 02/06/24   [name], NP  escitalopram  (LEXAPRO ) 20 MG tablet Take 1 tablet (20 mg total) by mouth at bedtime. 02/06/24   [name], NP  furosemide  (LASIX ) 40 MG tablet Take 1 tablet (40 mg total) by mouth daily. 02/06/24   [name], NP  melatonin 5 MG TABS Take 5 mg by mouth at bedtime.    [provider]  metoprolol  succinate (TOPROL -XL) 50 MG 24 hr tablet Take 1.5 tablets (75 mg total) by mouth daily. 02/06/24 08/04/24  [name], NP  nitrofurantoin , macrocrystal-monohydrate, (MACROBID ) 100 MG capsule Take 1 capsule (100 mg total) by mouth at bedtime. 02/06/24 04/06/24  [name], NP  nystatin  powder Apply 1 Application topically 2 (two) times daily as needed. 02/06/24   [name], NP  omeprazole  (PRILOSEC) 40 MG capsule Take 1 capsule (40 mg total) by mouth daily. 02/06/24   [name], NP  OXcarbazepine  (TRILEPTAL ) 150 MG tablet Take 1 tablet (150 mg total) by mouth 2 (two) times daily. 02/06/24   [name], NP  potassium chloride  SA (KLOR-CON  M) 20 MEQ tablet Take 1 tablet (20 mEq total) by mouth daily. 02/06/24   [name], NP  simvastatin  (ZOCOR ) 40 MG tablet Take 1 tablet (40 mg total) by mouth at bedtime. 02/06/24   [name], NP  Vitamin D ,  Ergocalciferol , (DRISDOL ) 1.25 MG (50000 UNIT) CAPS capsule Take 1 capsule (50,000 Units total) by mouth once a week. Patient taking differently: Take 50,000 Units by mouth every Tuesday. 11/17/23   [name], NP    Physical Exam: Vitals:   02/26/24 1800 02/26/24 1815 02/26/24 1830 02/26/24 1900  BP: (!) 92/42 (!) 107/59 (!) 116/53 (!) 107/56  Pulse: 72 71 71 66  Resp: 18 19 20 16   Temp:    98.2 F (36.8 C)  TempSrc:      SpO2: 95% 97% 97% 97%  Weight:      Height:       BP (!) 107/56   Pulse 66   Temp 98.2 F (36.8 C)   Resp 16   Ht 5' 2 (1.575 m)   Wt 82.1 kg   SpO2 97%  BMI 33.11 kg/m   Neurology awake and alert, deconditioned and ill looking appearing  ENT with mild pallor, no icterus Cardiovascular with S1 and S2 present and regular with no gallops, rubs or murmurs Respiratory with no rales or wheezing, no rhonchi Abdomen with no distention, soft and non tender No lower extremity edema   Data Reviewed:   Na 129, K 3,6 Cl 94 bicarbonate 25 glucose 95 bun 10 cr 0,70  AST 17 ALT 9  Lactic acid 1,3 Wbc 7,8 hgb 15,7 plt 285   Sars COVID 19 negative Influenza negative RSV negative   Urine analysis SG 1,016, protein 30, leukocytes moderate, small hgb, > 50 wbc and 11-20 rbc.   Chest radiograph with right rotation, hypoinflation, no cardiomegaly, no effusions or infiltrates.   EKG 70 bpm, normal axis, normal intervals, qtc 459, sinus rhythm with q waved lead III and aVF, no significant ST segment changes, negative T lead III and aVF.   Assessment and Plan: * Sepsis due to gram-negative UTI (HCC) Severe sepsis with tachypnea, hypotension and acute metabolic encephalopathy, (present on admission)  Recurrent urinary tract infections.  Last urine culture from 01/2024 positive for E coli sensitive to ceftriaxone , but resistant to ampicillin and ciprofloxacin, culture from 11/2023 was positive for VRE.  Urine infection due to drug resistant gram negative  bacteria.   Plan to continue antibiotic therapy with IV ceftriaxone  IV fluids with isotonic saline with dextrose  at 75 ml per hr Continue close follow up on cultures, cell count and temperature curve.   Acute metabolic encephalopathy Patient not at her baseline, encephalopathy due to urine infection.  Plan to continue supportive medical care with IV fluids, IV antibiotics and neuro checks per unit protocol Will need PT and OT   Essential hypertension Continue blood pressure monitoring Hold on furosemide  and continue with metoprolol .   Paroxysmal atrial fibrillation (HCC) Continue metoprolol  for rate control and anticoagulation with apixaban .  Continue telemetry monitoring   Mixed hyperlipidemia Continue statin therapy   Seizure disorder (HCC) No signs of active seizures, continue with oxcarbazepine , and divalproex .  Neuro checks per unit protocol.   Obesity, class 1 Calculated BMI is 33.1       Advance Care Planning:   Code Status: Full Code   Consults: none   Family Communication: I spoke with patient's daughter at the bedside, we talked in detail about patient's condition, plan of care and prognosis and all questions were addressed.   Severity of Illness: The appropriate patient status for this patient is INPATIENT. Inpatient status is judged to be reasonable and necessary in order to provide the required intensity of service to ensure the patient's safety. The patient's presenting symptoms, physical exam findings, and initial radiographic and laboratory data in the context of their chronic comorbidities is felt to place them at high risk for further clinical deterioration. Furthermore, it is not anticipated that the patient will be medically stable for discharge from the hospital within 2 midnights of admission.   * I certify that at the point of admission it is my clinical judgment that the patient will require inpatient hospital care spanning beyond 2 midnights from the  point of admission due to high intensity of service, high risk for further deterioration and high frequency of surveillance required.*  Author: Elidia Toribio Furnace, MD 02/26/2024 8:26 PM  For on call review [email or web address].

## 2024-02-27 DIAGNOSIS — I10 Essential (primary) hypertension: Secondary | ICD-10-CM | POA: Diagnosis not present

## 2024-02-27 DIAGNOSIS — A4150 Gram-negative sepsis, unspecified: Secondary | ICD-10-CM | POA: Diagnosis not present

## 2024-02-27 DIAGNOSIS — N390 Urinary tract infection, site not specified: Secondary | ICD-10-CM | POA: Diagnosis not present

## 2024-02-27 DIAGNOSIS — G9341 Metabolic encephalopathy: Secondary | ICD-10-CM | POA: Diagnosis not present

## 2024-02-27 LAB — CBC
HCT: 40.5 % (ref 36.0–46.0)
Hemoglobin: 13.3 g/dL (ref 12.0–15.0)
MCH: 31.4 pg (ref 26.0–34.0)
MCHC: 32.8 g/dL (ref 30.0–36.0)
MCV: 95.7 fL (ref 80.0–100.0)
Platelets: 260 K/uL (ref 150–400)
RBC: 4.23 MIL/uL (ref 3.87–5.11)
RDW: 13.7 % (ref 11.5–15.5)
WBC: 7.8 K/uL (ref 4.0–10.5)
nRBC: 0 % (ref 0.0–0.2)

## 2024-02-27 LAB — BASIC METABOLIC PANEL WITH GFR
Anion gap: 11 (ref 5–15)
BUN: 9 mg/dL (ref 8–23)
CO2: 26 mmol/L (ref 22–32)
Calcium: 8.8 mg/dL — ABNORMAL LOW (ref 8.9–10.3)
Chloride: 102 mmol/L (ref 98–111)
Creatinine, Ser: 0.62 mg/dL (ref 0.44–1.00)
GFR, Estimated: >60 mL/min (ref >60)
Glucose, Bld: 94 mg/dL (ref 70–99)
Potassium: 3.5 mmol/L (ref 3.5–5.1)
Sodium: 139 mmol/L (ref 135–145)

## 2024-02-27 MED ORDER — DEXTROSE-SODIUM CHLORIDE 5-0.9 % IV SOLN: INTRAVENOUS | Status: AC

## 2024-02-27 MED ORDER — SODIUM CHLORIDE 0.9 % IV SOLN
2.0000 g | INTRAVENOUS | Status: DC
  Administered 2024-02-27 – 2024-02-28 (×2): 2 g via INTRAVENOUS
  Filled 2024-02-27 (×2): qty 20

## 2024-02-27 MED ORDER — ENSURE PLUS HIGH PROTEIN PO LIQD
237.0000 mL | Freq: Two times a day (BID) | ORAL | Status: DC
Start: 2024-02-27 — End: 2024-03-05
  Administered 2024-02-27 – 2024-03-04 (×11): 237 mL via ORAL

## 2024-02-27 NOTE — Evaluation (Signed)
 Physical Therapy Evaluation Patient Details Name: Donna Barron MRN: [id number] DOB: [birth year] Today's Date: 02/27/2024  History of Present Illness  : Donna Barron is a 83 y.o. female with medical history significant of depression, dementia, paroxysmal atrial fibrillation, seizures, GERD, hypertension, and hyperlipidemia who presented with altered mental status.   Recent hospitalization 08/02 to 01/17/24 for urinary tract infection, complicated with acute metabolic encephalopathy. She was diagnosed with E Coli urine infection and was treated with ceftriaxone , placed on prophylactic nitrofurantoin  and estrogen vaginal cream for recurrent urinary tract infections and was discharged to SNF.   Eventually patient was discharged home and was recovering well with home health services.   09/08 had outpatient follow up with primary care with no major changes to her medications.      This time she was noted by her daughter to have a mild change in her mentation about 3 days ago, being less responsive and having poor appetite. The following day she had generalized weakness and was difficult to transfer out of the bed, her mentation continue to worsen. Her urine had a strong odor. Today she was completely dependent to be moved. Because rapid progressive of her symptoms her daughter called EMS and she was brought her to the ED for further evaluation. (per MD)   Clinical Impression  Patient demonstrates slow labored movement for sitting up at bedside requiring maximal assistance to advance and position BIL LEs and trunk for upright posturing and scooting EOB. In sitting, pt presents with significant posterior lean indicating poor trunk control. Patient demonstrates increased difficulty with STS, requiring x2 attempts and maximal cues to achieve full standing using RW, partially due to difficulty following commands but generalized weakness and posterior lean also present. Patient limited to labored side steps at  bedside, as she is not appropriate for further ambulation at this time. Patient will benefit from continued skilled physical therapy in hospital and recommended venue below to increase strength, balance, endurance for safe ADLs and gait.          If plan is discharge home, recommend the following: A lot of help with walking and/or transfers;Help with stairs or ramp for entrance;Assist for transportation   Can travel by private vehicle   No    Equipment Recommendations None recommended by PT  Recommendations for Other Services       Functional Status Assessment Patient has had a recent decline in their functional status and demonstrates the ability to make significant improvements in function in a reasonable and predictable amount of time.     Precautions / Restrictions Precautions Precautions: Fall Recall of Precautions/Restrictions: Impaired Restrictions Weight Bearing Restrictions Per Provider Order: No      Mobility  Bed Mobility Overal bed mobility: Needs Assistance Bed Mobility: Supine to Sit     Supine to sit: Max assist     General bed mobility comments: Labored movement, much assist required to advance and position BIL LEs and trunk, complicated by significant posterior leaning    Transfers Overall transfer level: Needs assistance Equipment used: Rolling walker (2 wheels) Transfers: Sit to/from Stand, Bed to chair/wheelchair/BSC Sit to Stand: Max assist   Step pivot transfers: Max assist       General transfer comment: Labored movement, much increased time; x2 attempt STS with maximal cueing for hand placement and powering up, significant posterior lean in standing    Ambulation/Gait Ambulation/Gait assistance: Mod assist, Max assist Gait Distance (Feet): 3 Feet Assistive device: Rolling walker (2 wheels) Gait Pattern/deviations: Decreased  step length - right, Decreased step length - left, Decreased stride length Gait velocity: Decreased     General  Gait Details: Pt limited to labored side steps at bedside secondary to generalized weakness, complicated by significant posterior lean and pts difficulty following commands  Stairs            Wheelchair Mobility     Tilt Bed    Modified Rankin (Stroke Patients Only)       Balance Overall balance assessment: Needs assistance Sitting-balance support: Feet supported, No upper extremity supported Sitting balance-Leahy Scale: Poor Sitting balance - Comments: seated EOB Postural control: Posterior lean Standing balance support: During functional activity, Bilateral upper extremity supported, Reliant on assistive device for balance Standing balance-Leahy Scale: Poor Standing balance comment: using RW                             Pertinent Vitals/Pain Pain Assessment Pain Assessment: No/denies pain    Home Living Family/patient expects to be discharged to:: Private residence Living Arrangements: Children Available Help at Discharge: Family;Available PRN/intermittently Type of Home: House Home Access: Stairs to enter Entrance Stairs-Rails: Can reach both;Left;Right Entrance Stairs-Number of Steps: 10   Home Layout: Two level;Able to live on main level with bedroom/bathroom Home Equipment: BSC/3in1;Wheelchair - manual Additional Comments: Per chart, pt presents from home, home living obtained from previous admission; no family present    Prior Function Prior Level of Function : Needs assist       Physical Assist : Mobility (physical);ADLs (physical)     Mobility Comments: Unable to obtain due to pts cognitive status ADLs Comments: Unable to obtain due to pts cognitive status     Extremity/Trunk Assessment   Upper Extremity Assessment Upper Extremity Assessment: Defer to OT evaluation    Lower Extremity Assessment Lower Extremity Assessment: Generalized weakness    Cervical / Trunk Assessment Cervical / Trunk Assessment: Kyphotic  Communication    Communication Communication: No apparent difficulties    Cognition Arousal: Alert Behavior During Therapy: WFL for tasks assessed/performed   PT - Cognitive impairments: History of cognitive impairments, No family/caregiver present to determine baseline                       PT - Cognition Comments: AOx2, self and time Following commands: Impaired Following commands impaired: Follows one step commands inconsistently, Follows one step commands with increased time     Cueing Cueing Techniques: Verbal cues, Tactile cues, Gestural cues     General Comments      Exercises     Assessment/Plan    PT Assessment Patient needs continued PT services  PT Problem List Decreased strength;Decreased mobility;Decreased activity tolerance;Decreased balance       PT Treatment Interventions DME instruction;Therapeutic activities;Therapeutic exercise;Balance training;Functional mobility training    PT Goals (Current goals can be found in the Care Plan section)  Acute Rehab PT Goals PT Goal Formulation: Patient unable to participate in goal setting Time For Goal Achievement: 03/12/24 Potential to Achieve Goals: Good    Frequency Min 3X/week     Co-evaluation PT/OT/SLP Co-Evaluation/Treatment: Yes Reason for Co-Treatment: To address functional/ADL transfers PT goals addressed during session: Mobility/safety with mobility;Balance;Proper use of DME         AM-PAC PT 6 Clicks Mobility  Outcome Measure Help needed turning from your back to your side while in a flat bed without using bedrails?: A Lot Help needed moving from lying on  your back to sitting on the side of a flat bed without using bedrails?: A Lot Help needed moving to and from a bed to a chair (including a wheelchair)?: A Lot Help needed standing up from a chair using your arms (e.g., wheelchair or bedside chair)?: A Lot Help needed to walk in hospital room?: A Lot Help needed climbing 3-5 steps with a railing? :  Total 6 Click Score: 11    End of Session Equipment Utilized During Treatment: Gait belt Activity Tolerance: Patient tolerated treatment well Patient left: in chair;with call bell/phone within reach;with chair alarm set Nurse Communication: Mobility status PT Visit Diagnosis: Unsteadiness on feet (R26.81);Other abnormalities of gait and mobility (R26.89);Muscle weakness (generalized) (M62.81)    Time: 8996-8968 PT Time Calculation (min) (ACUTE ONLY): 28 min   Charges:   PT Evaluation $PT Eval Moderate Complexity: 1 Mod PT Treatments $Therapeutic Activity: 23-37 mins PT General Charges $$ ACUTE PT VISIT: 1 Visit         2:56 PM, 02/27/24,  Onnie Como, SPT

## 2024-02-27 NOTE — Plan of Care (Signed)
  Problem: Acute Rehab OT Goals (only OT should resolve) Goal: Pt. Will Perform Grooming Flowsheets (Taken 02/27/2024 1636) Pt Will Perform Grooming:  with supervision  sitting Goal: Pt. Will Perform Upper Body Bathing Flowsheets (Taken 02/27/2024 1636) Pt Will Perform Upper Body Bathing:  with supervision  sitting Goal: Pt. Will Perform Upper Body Dressing Flowsheets (Taken 02/27/2024 1636) Pt Will Perform Upper Body Dressing:  with supervision  sitting Goal: Pt. Will Perform Lower Body Dressing Flowsheets (Taken 02/27/2024 1636) Pt Will Perform Lower Body Dressing:  with mod assist  with adaptive equipment  sitting/lateral leans Goal: Pt. Will Transfer To Toilet Flowsheets (Taken 02/27/2024 1636) Pt Will Transfer to Toilet:  with min assist  stand pivot transfer Goal: Pt. Will Perform Toileting-Clothing Manipulation Flowsheets (Taken 02/27/2024 1636) Pt Will Perform Toileting - Clothing Manipulation and hygiene:  with min assist  sitting/lateral leans  with mod assist Goal: Pt/Caregiver Will Perform Home Exercise Program Flowsheets (Taken 02/27/2024 1636) Pt/caregiver will Perform Home Exercise Program:  Increased strength  Increased ROM  Right Upper extremity  Left upper extremity  With minimal assist  Tiffani Kadow OT, MOT

## 2024-02-27 NOTE — Progress Notes (Signed)
 Mobility Specialist Progress Note:    02/27/24 1125  Mobility  Activity Stood at bedside  Level of Assistance Maximum assist, patient does 25-49%  Assistive Device Front wheel walker  Distance Ambulated (ft) 0 ft  Range of Motion/Exercises Active;All extremities  Activity Response Tolerated well  Mobility Referral Yes  Mobility visit 1 Mobility  Mobility Specialist Start Time (ACUTE ONLY) 1125  Mobility Specialist Stop Time (ACUTE ONLY) 1140  Mobility Specialist Time Calculation (min) (ACUTE ONLY) 15 min   Pt received in chair, NT requesting assistance standing pt for peri care. Required MaxA to stand with RW. Tolerated well, NT and family in room. All needs met.  Makaley Storts Mobility Specialist Please contact via Special educational needs teacher or  Rehab office at [phone]

## 2024-02-27 NOTE — ED Provider Notes (Signed)
 [facility] MEDICAL SURGICAL UNIT Provider Note   CSN: [id number] Arrival date & time: 02/26/24  1710     Patient presents with: Dysuria   Donna Barron is a 83 y.o. female.   Patient has a history of dementia and according to family she became more confused and weak today and had foul-smelling urine  The history is provided by a relative. No language interpreter was used.  Weakness Severity:  Moderate Onset quality:  Sudden Timing:  Constant Progression:  Worsening Chronicity:  Recurrent Context: not alcohol use   Relieved by:  Nothing Worsened by:  Nothing Ineffective treatments:  None tried Associated symptoms: no abdominal pain, no chest pain, no cough, no diarrhea, no frequency, no headaches and no seizures        Prior to Admission medications   Medication Sig Start Date End Date Taking? Authorizing Provider  apixaban  (ELIQUIS ) 5 MG TABS tablet Take 1 tablet (5 mg total) by mouth 2 (two) times daily. 02/06/24   [name], NP  bisacodyl  (DULCOLAX) 5 MG EC tablet Take 1 tablet (5 mg total) by mouth daily as needed for moderate constipation. 10/17/23 10/16/24  Maree, Pratik D, DO  conjugated estrogens  (PREMARIN ) 0.625 MG/GM vaginal cream Place 0.25 Applicatorfuls vaginally daily. 01/17/24 01/16/25  Maree, Pratik D, DO  divalproex  (DEPAKOTE  SPRINKLE) 125 MG capsule Take 2 capsules (250 mg total) by mouth 2 (two) times daily. 02/06/24   [name], NP  escitalopram  (LEXAPRO ) 20 MG tablet Take 1 tablet (20 mg total) by mouth at bedtime. 02/06/24   [name], NP  furosemide  (LASIX ) 40 MG tablet Take 1 tablet (40 mg total) by mouth daily. 02/06/24   [name], NP  melatonin 5 MG TABS Take 5 mg by mouth at bedtime.    [provider]  metoprolol  succinate (TOPROL -XL) 50 MG 24 hr tablet Take 1.5 tablets (75 mg total) by mouth daily. 02/06/24 08/04/24  [name], NP  nitrofurantoin , macrocrystal-monohydrate, (MACROBID ) 100 MG capsule Take 1 capsule  (100 mg total) by mouth at bedtime. 02/06/24 04/06/24  [name], NP  nystatin  powder Apply 1 Application topically 2 (two) times daily as needed. 02/06/24   [name], NP  omeprazole  (PRILOSEC) 40 MG capsule Take 1 capsule (40 mg total) by mouth daily. 02/06/24   [name], NP  OXcarbazepine  (TRILEPTAL ) 150 MG tablet Take 1 tablet (150 mg total) by mouth 2 (two) times daily. 02/06/24   [name], NP  potassium chloride  SA (KLOR-CON  M) 20 MEQ tablet Take 1 tablet (20 mEq total) by mouth daily. 02/06/24   [name], NP  simvastatin  (ZOCOR ) 40 MG tablet Take 1 tablet (40 mg total) by mouth at bedtime. 02/06/24   [name], NP  Vitamin D , Ergocalciferol , (DRISDOL ) 1.25 MG (50000 UNIT) CAPS capsule Take 1 capsule (50,000 Units total) by mouth once a week. Patient taking differently: Take 50,000 Units by mouth every Tuesday. 11/17/23   [name], NP    Allergies: Codeine , Hydrocodone , Avelox [moxifloxacin hcl in nacl], and Oxybutynin    Review of Systems  Constitutional:  Negative for appetite change and fatigue.  HENT:  Negative for congestion, ear discharge and sinus pressure.   Eyes:  Negative for discharge.  Respiratory:  Negative for cough.   Cardiovascular:  Negative for chest pain.  Gastrointestinal:  Negative for abdominal pain and diarrhea.  Genitourinary:  Negative for frequency and hematuria.  Musculoskeletal:  Negative for back  pain.  Skin:  Negative for rash.  Neurological:  Positive for weakness. Negative for seizures and headaches.  Psychiatric/Behavioral:  Negative for hallucinations.     Updated Vital Signs BP 110/70   Pulse 68   Temp 98.1 F (36.7 C)   Resp 18   Ht 5' 2 (1.575 m)   Wt 82.1 kg   SpO2 94%   BMI 33.11 kg/m   Physical Exam Vitals and nursing note reviewed.  Constitutional:      Appearance: She is well-developed.  HENT:     Head: Normocephalic.     Nose: Nose normal.  Eyes:     General: No scleral  icterus.    Conjunctiva/sclera: Conjunctivae normal.  Neck:     Thyroid : No thyromegaly.  Cardiovascular:     Rate and Rhythm: Normal rate and regular rhythm.     Heart sounds: No murmur heard.    No friction rub. No gallop.  Pulmonary:     Breath sounds: No stridor. No wheezing or rales.  Chest:     Chest wall: No tenderness.  Abdominal:     General: There is no distension.     Tenderness: There is no abdominal tenderness. There is no rebound.  Musculoskeletal:        General: Normal range of motion.     Cervical back: Neck supple.  Lymphadenopathy:     Cervical: No cervical adenopathy.  Skin:    Findings: No erythema or rash.  Neurological:     Motor: No abnormal muscle tone.     Coordination: Coordination normal.     Comments: Oriented to person and place  Psychiatric:        Behavior: Behavior normal.     (all labs ordered are listed, but only abnormal results are displayed) Labs Reviewed  URINALYSIS, ROUTINE W REFLEX MICROSCOPIC - Abnormal; Notable for the following components:      Result Value   APPearance CLOUDY (*)    Hgb urine dipstick SMALL (*)    Ketones, ur 5 (*)    Protein, ur 30 (*)    Leukocytes,Ua MODERATE (*)    All other components within normal limits  CBC WITH DIFFERENTIAL/PLATELET - Abnormal; Notable for the following components:   Hemoglobin 15.7 (*)    HCT 47.3 (*)    All other components within normal limits  COMPREHENSIVE METABOLIC PANEL WITH GFR - Abnormal; Notable for the following components:   Sodium 129 (*)    Chloride 94 (*)    Calcium 8.5 (*)    Albumin 3.3 (*)    All other components within normal limits  PROTIME-INR - Abnormal; Notable for the following components:   Prothrombin Time 15.6 (*)    All other components within normal limits  BASIC METABOLIC PANEL WITH GFR - Abnormal; Notable for the following components:   Calcium 8.8 (*)    All other components within normal limits  RESP PANEL BY RT-PCR (RSV, FLU A&B, COVID)   RVPGX2  CULTURE, BLOOD (ROUTINE X 2)  CULTURE, BLOOD (ROUTINE X 2)  URINE CULTURE  LACTIC ACID, PLASMA  LACTIC ACID, PLASMA  CBC    EKG: EKG Interpretation Date/Time:  Sunday February 26 2024 18:31:51 EDT Ventricular Rate:  70 PR Interval:  160 QRS Duration:  93 QT Interval:  425 QTC Calculation: 459 R Axis:   16  Text Interpretation: Sinus rhythm Probable left ventricular hypertrophy Inferior infarct, age indeterminate When compared with ECG of 01/14/2024, No significant change was found Confirmed by Raford,  Alm ([id number]) on 02/27/2024 12:36:33 AM  Radiology: DG Chest Portable 1 View Result Date: 02/26/2024 CLINICAL DATA:  Low oxygen EXAM: PORTABLE CHEST 1 VIEW COMPARISON:  Chest x-ray 12/12/2023 FINDINGS: Patient is slightly rotated. There is prominence of soft tissues in the right paratracheal region. This may be accentuated by patient rotation, but mediastinal process is not excluded. Heart size is normal. The lungs are clear. There is no pleural effusion or pneumothorax. No acute fractures are identified. IMPRESSION: Prominence of soft tissues in the right paratracheal region. This may be accentuated by patient rotation, but mediastinal process is not excluded. Recommend further evaluation with PA and lateral chest x-ray. Electronically Signed   By: Greig Pique M.D.   On: 02/26/2024 18:33     Procedures   Medications Ordered in the ED  metoprolol  succinate (TOPROL -XL) 24 hr tablet 75 mg (75 mg Oral Given 02/27/24 0845)  simvastatin  (ZOCOR ) tablet 40 mg (40 mg Oral Given 02/26/24 2126)  escitalopram  (LEXAPRO ) tablet 20 mg (20 mg Oral Given 02/26/24 2125)  pantoprazole  (PROTONIX ) EC tablet 40 mg (40 mg Oral Given 02/27/24 0844)  apixaban  (ELIQUIS ) tablet 5 mg (5 mg Oral Given 02/27/24 0845)  melatonin tablet 3 mg (3 mg Oral Given 02/26/24 2125)  divalproex  (DEPAKOTE  SPRINKLE) capsule 250 mg (250 mg Oral Given 02/27/24 0845)  OXcarbazepine  (TRILEPTAL ) tablet 150 mg ( Oral Canceled  Entry 02/27/24 0943)  acetaminophen  (TYLENOL ) tablet 650 mg (has no administration in time range)    Or  acetaminophen  (TYLENOL ) suppository 650 mg (has no administration in time range)  ondansetron  (ZOFRAN ) tablet 4 mg (has no administration in time range)    Or  ondansetron  (ZOFRAN ) injection 4 mg (has no administration in time range)  dextrose  5 %-0.9 % sodium chloride  infusion ( Intravenous Stopped 02/27/24 0432)  cefTRIAXone  (ROCEPHIN ) 2 g in sodium chloride  0.9 % 100 mL IVPB (has no administration in time range)  feeding supplement (ENSURE PLUS HIGH PROTEIN) liquid 237 mL (237 mLs Oral Given 02/27/24 0925)  cefTRIAXone  (ROCEPHIN ) 2 g in sodium chloride  0.9 % 100 mL IVPB (0 g Intravenous Stopped 02/26/24 1854)  sodium chloride  0.9 % bolus 2,000 mL (0 mLs Intravenous Stopped 02/26/24 2035)                                    Medical Decision Making Amount and/or Complexity of Data Reviewed Labs: ordered. Radiology: ordered.  Risk Decision regarding hospitalization.  Patient with urinary tract infection and progressive weakness.  She will be started on antibiotics and admitted to medicine     Final diagnoses:  Lower urinary tract infectious disease    ED Discharge Orders     None          Suzette Pac, MD 02/27/24 1144

## 2024-02-27 NOTE — Plan of Care (Signed)

## 2024-02-27 NOTE — Progress Notes (Signed)
   02/27/24 1535  TOC Brief Assessment  Insurance and Status Reviewed  Patient has primary care physician Yes  Home environment has been reviewed Home with family  Prior/Current Home Services Current home services [facility])  Social Drivers of Health Review SDOH reviewed no interventions necessary  Readmission risk has been reviewed Yes  Transition of care needs no transition of care needs at this time   Patient is [facility] with [facility], TOC following.  Transition of Care Department [facility]) has reviewed patient and no TOC needs have been identified at this time. We will continue to monitor patient advancement through interdisciplinary progression rounds. If new patient transition needs arise, please place a TOC consult.

## 2024-02-27 NOTE — Evaluation (Signed)
 Occupational Therapy Evaluation Patient Details Name: Donna Barron MRN: [id number] DOB: 02-Feb-1941 Today's Date: 02/27/2024   History of Present Illness   : Donna Barron is a 83 y.o. female with medical history significant of depression, dementia, paroxysmal atrial fibrillation, seizures, GERD, hypertension, and hyperlipidemia who presented with altered mental status.   Recent hospitalization 08/02 to 01/17/24 for urinary tract infection, complicated with acute metabolic encephalopathy. She was diagnosed with E Coli urine infection and was treated with ceftriaxone , placed on prophylactic nitrofurantoin  and estrogen vaginal cream for recurrent urinary tract infections and was discharged to SNF.   Eventually patient was discharged home and was recovering well with home health services.   09/08 had outpatient follow up with primary care with no major changes to her medications.      This time she was noted by her daughter to have a mild change in her mentation about 3 days ago, being less responsive and having poor appetite. The following day she had generalized weakness and was difficult to transfer out of the bed, her mentation continue to worsen. Her urine had a strong odor. Today she was completely dependent to be moved. Because rapid progressive of her symptoms her daughter called EMS and she was brought her to the ED for further evaluation. (per MD)     Clinical Impressions Pt agreeable to OT and PT co-evaluation. Pt working with PT when this therapist entered the room. Pt required max A for EOB to chair with RW. Pt assisted for some ADL's at baseline per chart. No family present to confirm any living history. Pt confused but able to follow commands with time and cuing. Pt appears to required max to total assist for lower body ADL's and min to mod A for most upper body tasks due to R shoulder limitations. Pt left in the chair with call bell within reach and chair alarm set. Pt will benefit from  continued OT in the hospital to increase strength, balance, and endurance for safe ADL's.        If plan is discharge home, recommend the following:   A lot of help with walking and/or transfers;A lot of help with bathing/dressing/bathroom;Assistance with cooking/housework;Assistance with feeding;Direct supervision/assist for medications management;Assist for transportation;Help with stairs or ramp for entrance     Functional Status Assessment   Patient has had a recent decline in their functional status and demonstrates the ability to make significant improvements in function in a reasonable and predictable amount of time.     Equipment Recommendations   None recommended by OT             Precautions/Restrictions   Precautions Precautions: Fall Recall of Precautions/Restrictions: Impaired Restrictions Weight Bearing Restrictions Per Provider Order: No     Mobility Bed Mobility               General bed mobility comments: Pt up standing with PT when this therapist entered the room.    Transfers Overall transfer level: Needs assistance Equipment used: Rolling walker (2 wheels) Transfers: Sit to/from Stand, Bed to chair/wheelchair/BSC Sit to Stand: Max assist     Step pivot transfers: Max assist     General transfer comment: EOB to chair with RW and assist from physical therapy student.      Balance Overall balance assessment: Needs assistance Sitting-balance support: Bilateral upper extremity supported, Feet supported Sitting balance-Leahy Scale: Poor Sitting balance - Comments: poor to fair seated in the recliner; difficulty with anterior reaching.   Standing  balance support: During functional activity, Bilateral upper extremity supported, Reliant on assistive device for balance Standing balance-Leahy Scale: Poor Standing balance comment: using RW                           ADL either performed or assessed with clinical judgement    ADL Overall ADL's : Needs assistance/impaired Eating/Feeding: Set up;Sitting   Grooming: Minimal assistance;Moderate assistance;Sitting   Upper Body Bathing: Moderate assistance;Sitting   Lower Body Bathing: Maximal assistance;Total assistance;Sitting/lateral leans   Upper Body Dressing : Minimal assistance;Moderate assistance;Sitting   Lower Body Dressing: Maximal assistance;Total assistance;Sitting/lateral leans   Toilet Transfer: Maximal assistance;Stand-pivot;Rolling walker (2 wheels) Toilet Transfer Details (indicate cue type and reason): EOB to chair with RW with physical therapy. Toileting- Clothing Manipulation and Hygiene: Maximal assistance;Total assistance;Sitting/lateral lean;Bed level               Vision Baseline Vision/History: 0 No visual deficits Ability to See in Adequate Light: 0 Adequate Patient Visual Report: No change from baseline Vision Assessment?: No apparent visual deficits     Perception Perception: Not tested       Praxis Praxis: Not tested       Pertinent Vitals/Pain Pain Assessment Pain Assessment: No/denies pain     Extremity/Trunk Assessment Upper Extremity Assessment Upper Extremity Assessment: RUE deficits/detail;Generalized weakness (L UE generally weak.) RUE Deficits / Details: 2+/5 shoulder flexion; P/ROM also limited to ~<75% of available range.   Lower Extremity Assessment Lower Extremity Assessment: Defer to PT evaluation   Cervical / Trunk Assessment Cervical / Trunk Assessment: Kyphotic   Communication Communication Communication: No apparent difficulties   Cognition Arousal: Alert Behavior During Therapy: WFL for tasks assessed/performed Cognition: No family/caregiver present to determine baseline             OT - Cognition Comments: Oreinted to month, year, and situation, but not place.                 Following commands: Impaired Following commands impaired: Follows one step commands  inconsistently, Follows one step commands with increased time     Cueing  General Comments   Cueing Techniques: Verbal cues;Tactile cues;Gestural cues                 Home Living Family/patient expects to be discharged to:: Private residence Living Arrangements: Children Available Help at Discharge: Family;Available PRN/intermittently Type of Home: House Home Access: Stairs to enter [employer] of Steps: 10 Entrance Stairs-Rails: Can reach both;Left;Right Home Layout: Two level;Able to live on main level with bedroom/bathroom     Bathroom Shower/Tub: Producer, television/film/video: Standard Bathroom Accessibility: Yes How Accessible: Accessible via wheelchair;Accessible via walker Home Equipment: BSC/3in1;Wheelchair - manual   Additional Comments: Per chart. home living obtained from previous admission; no family present      Prior Functioning/Environment Prior Level of Function : Needs assist;Patient poor historian/Family not available       Physical Assist : ADLs (physical);Mobility (physical) Mobility (physical): Transfers;Gait;Stairs ADLs (physical): Toileting;Dressing;IADLs;Bathing Mobility Comments: Unable to obtain due to pts cognitive status; prior history indicated 75% assist for mobility using RW. ADLs Comments: Assisted a little for bathing, dressing, and toileting at baseline according to daughter. Pt reported a lot of assist needed. *per chart (no family present to confirm history)    OT Problem List: Decreased strength;Decreased range of motion;Impaired balance (sitting and/or standing);Decreased activity tolerance;Decreased cognition;Decreased safety awareness;Obesity   OT Treatment/Interventions: Self-care/ADL training;Therapeutic exercise;DME and/or  AE instruction;Therapeutic activities;Patient/family education;Cognitive remediation/compensation;Balance training      OT Goals(Current goals can be found in the care plan section)    Acute Rehab OT Goals Patient Stated Goal: improve function OT Goal Formulation: With patient Time For Goal Achievement: 03/12/24 Potential to Achieve Goals: Fair   OT Frequency:  Min 3X/week    Co-evaluation PT/OT/SLP Co-Evaluation/Treatment: Yes Reason for Co-Treatment: To address functional/ADL transfers PT goals addressed during session: Mobility/safety with mobility;Balance;Proper use of DME OT goals addressed during session: ADL's and self-care                       End of Session Equipment Utilized During Treatment: Rolling walker (2 wheels);Gait belt  Activity Tolerance: Patient tolerated treatment well Patient left: in chair;with call bell/phone within reach;with chair alarm set  OT Visit Diagnosis: Unsteadiness on feet (R26.81);Other abnormalities of gait and mobility (R26.89);Muscle weakness (generalized) (M62.81);Other symptoms and signs involving cognitive function                Time: 8971-8963 OT Time Calculation (min): 8 min Charges:  OT General Charges $OT Visit: 1 Visit OT Evaluation $OT Eval Low Complexity: 1 Low  Jazz Biddy OT, MOT   Jayson Person 02/27/2024, 4:34 PM

## 2024-02-27 NOTE — ED Notes (Signed)
 ED TO INPATIENT HANDOFF REPORT  ED Nurse Name and Phone #: [phone]   S Name/Age/Gender Donna Barron 83 y.o. female Room/Bed: APA18/APA18  Code Status   Code Status: Full Code  Home/SNF/Other Home Patient oriented to: self, place, and time Is this baseline? Yes   Triage Complete: Triage complete  Chief Complaint Sepsis secondary to UTI (HCC) [A41.9, N39.0]  Triage Note [facility] pt coming from home- recently discharged from rehab facility. Family called ems for unresponsiveness and strong odor to urine since yesterday. Patient answering questions on arrival however delayed responses. Alert to self and location.    Allergies Allergies  Allergen Reactions   Codeine  Swelling    Swelling of lips   Hydrocodone  Swelling    Lip swelling   Avelox [Moxifloxacin Hcl In Nacl] Other (See Comments)    Insomnia    Oxybutynin Palpitations and Other (See Comments)    Tongue felt coated     Level of Care/Admitting Diagnosis ED Disposition     ED Disposition  Admit   Condition  --   Comment  Hospital Area: [facility] [[id number]]  Level of Care: Telemetry [5]  Covid Evaluation: Asymptomatic - no recent exposure (last 10 days) testing not required  Diagnosis: Sepsis secondary to UTI [facility]) [300253]  Admitting Physician: ARRIEN, MAURICIO DANIEL [[id number]]  Attending Physician: ARRIEN, MAURICIO DANIEL [[id number]]  Certification:: I certify this patient will need inpatient services for at least 2 midnights  Expected Medical Readiness: 02/29/2024          B Medical/Surgery History Past Medical History:  Diagnosis Date   Anxiety    Cataract    Colon polyps    Dementia (HCC)    Depression    Dysrhythmia    heart palipations   Gallstones    GERD (gastroesophageal reflux disease)    HTN (hypertension)    Hypercholesteremia    PONV (postoperative nausea and vomiting)    Past Surgical History:  Procedure Laterality Date   APPENDECTOMY     ARTHROSCOPIC  REPAIR ACL  05/2008   Left knee   CARPAL TUNNEL RELEASE Right 07/14/2017   Procedure: CARPAL TUNNEL RELEASE;  Surgeon: Margrette Taft BRAVO, MD;  Location: [facility];  Service: Orthopedics;  Laterality: Right;   CATARACT EXTRACTION, BILATERAL  2020   CHOLECYSTECTOMY     DILATION AND CURETTAGE OF UTERUS     DORSAL COMPARTMENT RELEASE Right 07/14/2017   Procedure: RELEASE DORSAL COMPARTMENT (DEQUERVAIN);  Surgeon: Margrette Taft BRAVO, MD;  Location: [facility];  Service: Orthopedics;  Laterality: Right;   HIATAL HERNIA REPAIR N/A 03/24/2020   Procedure: LAPAROSCOPIC REPAIR OF HIATAL HERNIA, nissen fundoplication;  Surgeon: Gladis Cough, MD;  Location: [facility];  Service: General;  Laterality: N/A;   HYSTEROSCOPY WITH D & C N/A 12/25/2013   Procedure: DILATATION AND CURETTAGE /HYSTEROSCOPY;  Surgeon: Peggye Gull, MD;  Location: [facility];  Service: Gynecology;  Laterality: N/A;   SHOULDER OPEN ROTATOR CUFF REPAIR Left 06/15/2018   Procedure: ROTATOR CUFF REPAIR SHOULDER OPEN WITH ACROMIOPLASTY;  Surgeon: Margrette Taft BRAVO, MD;  Location: [facility];  Service: Orthopedics;  Laterality: Left;   TUBAL LIGATION       A IV Location/Drains/Wounds Patient Lines/Drains/Airways Status     Active Line/Drains/Airways     Name Placement date Placement time Site Days   Peripheral IV 02/26/24 20 G Anterior;Proximal;Right Forearm 02/26/24  1800  Forearm  1   Peripheral IV 02/26/24 20 G 1 Anterior;Left;Proximal Forearm 02/26/24  1829  Forearm  1   Wound / Incision (Open or Dehisced) 10/13/23 Skin tear;Other (Comment) Buttocks Left 10/13/23  1700  Buttocks  137            Intake/Output Last 24 hours  Intake/Output Summary (Last 24 hours) at 02/27/2024 0716 Last data filed at 02/27/2024 0249 Gross per 24 hour  Intake --  Output 240 ml  Net -240 ml    Labs/Imaging Results for orders placed or performed during the hospital encounter of 02/26/24 (from the past 48 hours)  Urinalysis, Routine w reflex microscopic  -Urine, Clean Catch     Status: Abnormal   Collection Time: 02/26/24  5:16 PM  Result Value Ref Range   Color, Urine YELLOW YELLOW   APPearance CLOUDY (A) CLEAR   Specific Gravity, Urine 1.016 1.005 - 1.030   pH 7.0 5.0 - 8.0   Glucose, UA NEGATIVE NEGATIVE mg/dL   Hgb urine dipstick SMALL (A) NEGATIVE   Bilirubin Urine NEGATIVE NEGATIVE   Ketones, ur 5 (A) NEGATIVE mg/dL   Protein, ur 30 (A) NEGATIVE mg/dL   Nitrite NEGATIVE NEGATIVE   Leukocytes,Ua MODERATE (A) NEGATIVE   RBC / HPF 11-20 0 - 5 RBC/hpf   WBC, UA >50 0 - 5 WBC/hpf   Bacteria, UA NONE SEEN NONE SEEN   Squamous Epithelial / HPF 0-5 0 - 5 /HPF   WBC Clumps PRESENT    Mucus PRESENT     Comment: Performed at [facility], [address] [place], [place] [address]  CBC with Differential     Status: Abnormal   Collection Time: 02/26/24  5:44 PM  Result Value Ref Range   WBC 7.8 4.0 - 10.5 K/uL   RBC 5.09 3.87 - 5.11 MIL/uL   Hemoglobin 15.7 (H) 12.0 - 15.0 g/dL   HCT 52.6 (H) 63.9 - 53.9 %   MCV 92.9 80.0 - 100.0 fL   MCH 30.8 26.0 - 34.0 pg   MCHC 33.2 30.0 - 36.0 g/dL   RDW 86.4 88.4 - 84.4 %   Platelets 285 150 - 400 K/uL   nRBC 0.0 0.0 - 0.2 %   Neutrophils Relative % 67 %   Neutro Abs 5.2 1.7 - 7.7 K/uL   Lymphocytes Relative 24 %   Lymphs Abs 1.9 0.7 - 4.0 K/uL   Monocytes Relative 9 %   Monocytes Absolute 0.7 0.1 - 1.0 K/uL   Eosinophils Relative 0 %   Eosinophils Absolute 0.0 0.0 - 0.5 K/uL   Basophils Relative 0 %   Basophils Absolute 0.0 0.0 - 0.1 K/uL   Immature Granulocytes 0 %   Abs Immature Granulocytes 0.02 0.00 - 0.07 K/uL    Comment: Performed at [facility], [address], [place], [place] [address]  Comprehensive metabolic panel     Status: Abnormal   Collection Time: 02/26/24  5:44 PM  Result Value Ref Range   Sodium 129 (L) 135 - 145 mmol/L   Potassium 3.6 3.5 - 5.1 mmol/L   Chloride 94 (L) 98 - 111 mmol/L   CO2 25 22 - 32 mmol/L   Glucose, Bld 95 70 - 99 mg/dL    Comment: Glucose  reference range applies only to samples taken after fasting for at least 8 hours.   BUN 10 8 - 23 mg/dL   Creatinine, Ser 9.29 0.44 - 1.00 mg/dL   Calcium 8.5 (L) 8.9 - 10.3 mg/dL   Total Protein 6.9 6.5 - 8.1 g/dL   Albumin 3.3 (L) 3.5 - 5.0 g/dL  AST 17 15 - 41 U/L   ALT 9 0 - 44 U/L   Alkaline Phosphatase 83 38 - 126 U/L   Total Bilirubin 0.5 0.0 - 1.2 mg/dL   GFR, Estimated >39 >39 mL/min    Comment: (NOTE) Calculated using the CKD-EPI Creatinine Equation (2021)    Anion gap 10 5 - 15    Comment: Performed at [facility], [address] [place], [place] [address]  Lactic acid, plasma     Status: None   Collection Time: 02/26/24  6:05 PM  Result Value Ref Range   Lactic Acid, Venous 1.3 0.5 - 1.9 mmol/L    Comment: Performed at [facility], [address] [place], [place] [address]  Blood Culture (routine x 2)     Status: None (Preliminary result)   Collection Time: 02/26/24  6:19 PM   Specimen: BLOOD LEFT FOREARM  Result Value Ref Range   Specimen Description BLOOD LEFT FOREARM    Special Requests NONE    Culture      NO GROWTH < 12 HOURS Performed at [facility], [address], [place], [place] [address]    Report Status PENDING   Resp panel by RT-PCR (RSV, Flu A&B, Covid) Anterior Nasal Swab     Status: None   Collection Time: 02/26/24  6:20 PM   Specimen: Anterior Nasal Swab  Result Value Ref Range   SARS Coronavirus 2 by RT PCR NEGATIVE NEGATIVE    Comment: (NOTE) SARS-CoV-2 target nucleic acids are NOT DETECTED.  The SARS-CoV-2 RNA is generally detectable in upper respiratory specimens during the acute phase of infection. The lowest concentration of SARS-CoV-2 viral copies this assay can detect is 138 copies/mL. A negative result does not preclude SARS-Cov-2 infection and should not be used as the sole basis for treatment or other patient management decisions. A negative result may occur with  improper specimen collection/handling, submission of specimen  other than nasopharyngeal swab, presence of viral mutation(s) within the areas targeted by this assay, and inadequate number of viral copies(<138 copies/mL). A negative result must be combined with clinical observations, patient history, and epidemiological information. The expected result is Negative.  Fact Sheet for Patients:  BloggerCourse.com  Fact Sheet for Healthcare Providers:  SeriousBroker.it  This test is no t yet approved or cleared by the [place]  FDA and  has been authorized for detection and/or diagnosis of SARS-CoV-2 by FDA under an Emergency Use Authorization (EUA). This EUA will remain  in effect (meaning this test can be used) for the duration of the COVID-19 declaration under Section 564(b)(1) of the Act, 21 U.S.C.section 360bbb-3(b)(1), unless the authorization is terminated  or revoked sooner.       Influenza A by PCR NEGATIVE NEGATIVE   Influenza B by PCR NEGATIVE NEGATIVE    Comment: (NOTE) The Xpert Xpress SARS-CoV-2/FLU/RSV plus assay is intended as an aid in the diagnosis of influenza from Nasopharyngeal swab specimens and should not be used as a sole basis for treatment. Nasal washings and aspirates are unacceptable for Xpert Xpress SARS-CoV-2/FLU/RSV testing.  Fact Sheet for Patients: BloggerCourse.com  Fact Sheet for Healthcare Providers: SeriousBroker.it  This test is not yet approved or cleared by the [place]  FDA and has been authorized for detection and/or diagnosis of SARS-CoV-2 by FDA under an Emergency Use Authorization (EUA). This EUA will remain in effect (meaning this test can be used) for the duration of the COVID-19 declaration under Section 564(b)(1) of the Act, 21 U.S.C. section 360bbb-3(b)(1), unless the  authorization is terminated or revoked.     Resp Syncytial Virus by PCR NEGATIVE NEGATIVE    Comment: (NOTE) Fact  Sheet for Patients: BloggerCourse.com  Fact Sheet for Healthcare Providers: SeriousBroker.it  This test is not yet approved or cleared by the [place]  FDA and has been authorized for detection and/or diagnosis of SARS-CoV-2 by FDA under an Emergency Use Authorization (EUA). This EUA will remain in effect (meaning this test can be used) for the duration of the COVID-19 declaration under Section 564(b)(1) of the Act, 21 U.S.C. section 360bbb-3(b)(1), unless the authorization is terminated or revoked.  Performed at [facility], [address] [place], [place] [address]   Blood Culture (routine x 2)     Status: None (Preliminary result)   Collection Time: 02/26/24  6:27 PM   Specimen: Left Antecubital; Blood  Result Value Ref Range   Specimen Description LEFT ANTECUBITAL    Special Requests NONE    Culture      NO GROWTH < 12 HOURS Performed at [facility], [address] [place], [place] [address]    Report Status PENDING   Protime-INR     Status: Abnormal   Collection Time: 02/26/24  6:52 PM  Result Value Ref Range   Prothrombin Time 15.6 (H) 11.4 - 15.2 seconds   INR 1.2 0.8 - 1.2    Comment: (NOTE) INR goal varies based on device and disease states. Performed at [facility], [address] [place], [place] [address]   Lactic acid, plasma     Status: None   Collection Time: 02/26/24  8:38 PM  Result Value Ref Range   Lactic Acid, Venous 1.4 0.5 - 1.9 mmol/L    Comment: Performed at [facility], [address] [place], [place] [address]  Basic metabolic panel     Status: Abnormal   Collection Time: 02/27/24  4:58 AM  Result Value Ref Range   Sodium 139 135 - 145 mmol/L    Comment: DELTA CHECK NOTED   Potassium 3.5 3.5 - 5.1 mmol/L   Chloride 102 98 - 111 mmol/L   CO2 26 22 - 32 mmol/L   Glucose, Bld 94 70 - 99 mg/dL    Comment: Glucose reference range applies only to samples taken after fasting for at least 8  hours.   BUN 9 8 - 23 mg/dL   Creatinine, Ser 9.37 0.44 - 1.00 mg/dL   Calcium 8.8 (L) 8.9 - 10.3 mg/dL   GFR, Estimated >39 >39 mL/min    Comment: (NOTE) Calculated using the CKD-EPI Creatinine Equation (2021)    Anion gap 11 5 - 15    Comment: Performed at [facility], [address], [place], [place] [address]  CBC     Status: None   Collection Time: 02/27/24  4:58 AM  Result Value Ref Range   WBC 7.8 4.0 - 10.5 K/uL   RBC 4.23 3.87 - 5.11 MIL/uL   Hemoglobin 13.3 12.0 - 15.0 g/dL   HCT 59.4 63.9 - 53.9 %   MCV 95.7 80.0 - 100.0 fL   MCH 31.4 26.0 - 34.0 pg   MCHC 32.8 30.0 - 36.0 g/dL   RDW 86.2 88.4 - 84.4 %   Platelets 260 150 - 400 K/uL   nRBC 0.0 0.0 - 0.2 %    Comment: Performed at [facility], [address], [place], [place] [address]   DG Chest Portable 1 View Result Date: 02/26/2024 CLINICAL DATA:  Low oxygen EXAM: PORTABLE CHEST 1 VIEW  COMPARISON:  Chest x-ray 12/12/2023 FINDINGS: Patient is slightly rotated. There is prominence of soft tissues in the right paratracheal region. This may be accentuated by patient rotation, but mediastinal process is not excluded. Heart size is normal. The lungs are clear. There is no pleural effusion or pneumothorax. No acute fractures are identified. IMPRESSION: Prominence of soft tissues in the right paratracheal region. This may be accentuated by patient rotation, but mediastinal process is not excluded. Recommend further evaluation with PA and lateral chest x-ray. Electronically Signed   By: Greig Pique M.D.   On: 02/26/2024 18:33    Pending Labs Unresulted Labs (From admission, onward)     Start     Ordered   02/26/24 1955  Urine Culture  Once,   URGENT       Question:  Indication  Answer:  Dysuria   02/26/24 1954            Vitals/Pain Today's Vitals   02/27/24 0247 02/27/24 0400 02/27/24 0510 02/27/24 0600  BP: (!) 97/47 (!) 96/46  105/64  Pulse: 62 61  62  Resp: 20 17  (!) 32  Temp: 97.8 F (36.6 C)   98.1 F  (36.7 C)  TempSrc: Oral     SpO2: 92% 92%  94%  Weight:      Height:      PainSc: 0-No pain 0-No pain Asleep     Isolation Precautions No active isolations  Medications Medications  metoprolol  succinate (TOPROL -XL) 24 hr tablet 75 mg (75 mg Oral Given 02/26/24 2124)  simvastatin  (ZOCOR ) tablet 40 mg (40 mg Oral Given 02/26/24 2126)  escitalopram  (LEXAPRO ) tablet 20 mg (20 mg Oral Given 02/26/24 2125)  pantoprazole  (PROTONIX ) EC tablet 40 mg (40 mg Oral Given 02/26/24 2124)  apixaban  (ELIQUIS ) tablet 5 mg (5 mg Oral Given 02/26/24 2123)  melatonin tablet 3 mg (3 mg Oral Given 02/26/24 2125)  divalproex  (DEPAKOTE  SPRINKLE) capsule 250 mg (250 mg Oral Given 02/26/24 2122)  OXcarbazepine  (TRILEPTAL ) tablet 150 mg (150 mg Oral Given 02/26/24 2254)  acetaminophen  (TYLENOL ) tablet 650 mg (has no administration in time range)    Or  acetaminophen  (TYLENOL ) suppository 650 mg (has no administration in time range)  ondansetron  (ZOFRAN ) tablet 4 mg (has no administration in time range)    Or  ondansetron  (ZOFRAN ) injection 4 mg (has no administration in time range)  cefTRIAXone  (ROCEPHIN ) 1 g in sodium chloride  0.9 % 100 mL IVPB (has no administration in time range)  dextrose  5 %-0.9 % sodium chloride  infusion ( Intravenous New Bag/Given 02/26/24 2319)  cefTRIAXone  (ROCEPHIN ) 2 g in sodium chloride  0.9 % 100 mL IVPB (0 g Intravenous Stopped 02/26/24 1854)  sodium chloride  0.9 % bolus 2,000 mL (0 mLs Intravenous Stopped 02/26/24 2035)    Mobility  pt walks at home reportedly well, but has not been up here.      Focused Assessments    R Recommendations: See Admitting Provider Note  Report given to:   Additional Notes: pt walks at home reportedly well, but has not been up here.

## 2024-02-27 NOTE — Progress Notes (Signed)
 PROGRESS NOTE   Donna Barron  FMW:[id number] DOB: 04-Jul-1940 DOA: 02/26/2024 PCP: [name], NP   Chief Complaint  Patient presents with   Dysuria   Level of care: Telemetry  Brief Admission History:  83 y.o. female with medical history significant of depression, dementia, paroxysmal atrial fibrillation, seizures, GERD, hypertension, and hyperlipidemia who presented with altered mental status.  Recent hospitalization 08/02 to 01/17/24 for urinary tract infection, complicated with acute metabolic encephalopathy. She was diagnosed with E Coli urine infection and was treated with ceftriaxone , placed on prophylactic nitrofurantoin  and estrogen vaginal cream for recurrent urinary tract infections and was discharged to SNF.  Eventually patient was discharged home and was recovering well with home health services.   09/08 had outpatient follow up with primary care with no major changes to her medications.    This time she was noted by her daughter to have a mild change in her mentation about 3 days ago, being less responsive and having poor appetite. The following day she had generalized weakness and was difficult to transfer out of the bed, her mentation continue to worsen. Her urine had a strong odor. Today she was completely dependent to be moved. Because rapid progressive of her symptoms her daughter called EMS and she was brought her to the ED for further evaluation.    At the time of my examination she is able to respond simple questions, most of the information is from her daughter at the bedside.  At home uses walker for ambulation.    Assessment and Plan:  Sepsis due to gram-negative UTI Severe sepsis with tachypnea, hypotension and acute metabolic encephalopathy, (present on admission)  Recurrent urinary tract infections.  Last urine culture from 01/2024 positive for E coli sensitive to ceftriaxone , but resistant to ampicillin and ciprofloxacin , culture from 11/2023 was positive  for VRE.  Urine infection due to drug resistant gram negative bacteria.   Plan to continue antibiotic therapy with IV ceftriaxone  IV fluids with isotonic saline with dextrose  at 75 ml per hr Continue close follow up on cultures, cell count and temperature curve.   Gram positive Cocci Bacteremia -- continue IV high dose ceftriaxone  -- follow up ID and sensitivities  -- further recommendations to follow  Seizure disorder  No signs of active seizures, continue with oxcarbazepine , and divalproex .  Neuro checks per unit protocol.   Paroxysmal atrial fibrillation  Continue metoprolol  for rate control and anticoagulation with apixaban .  Continue telemetry monitoring   Acute metabolic encephalopathy -- Improving  Patient not at her baseline, encephalopathy due to urine infection.  Plan to continue supportive medical care with IV fluids, IV antibiotics and neuro checks per unit protocol Will need PT and OT   Essential hypertension Continue blood pressure monitoring Hold on furosemide  and continue with metoprolol .   Obesity, class 1 Calculated BMI is 33.1   Mixed hyperlipidemia Continue statin therapy   DVT prophylaxis: apixaban  Code Status: full  Family Communication:  Disposition:    Consultants:   Procedures:   Antimicrobials:  Ceftriaxone  2 gm daily 9/14>>   Subjective: Pt reports ongoing malaise and dysuria symptoms.  Objective: Vitals:   02/27/24 0600 02/27/24 0845 02/27/24 0905 02/27/24 1451  BP: 105/64 110/70  (!) 96/37  Pulse: 62 68  65  Resp: (!) 32 18 18 18   Temp: 98.1 F (36.7 C)   98 F (36.7 C)  TempSrc:    Oral  SpO2: 94%   98%  Weight:      Height:  Intake/Output Summary (Last 24 hours) at 02/27/2024 1628 Last data filed at 02/27/2024 0900 Gross per 24 hour  Intake 628.4 ml  Output 240 ml  Net 388.4 ml   Filed Weights   02/26/24 1723  Weight: 82.1 kg   Examination:  General exam: Appears calm and comfortable  Respiratory system:  Clear to auscultation. Respiratory effort normal. Cardiovascular system: normal S1 & S2 heard. No JVD, murmurs, rubs, gallops or clicks. No pedal edema. Gastrointestinal system: Abdomen is nondistended, soft and nontender. No organomegaly or masses felt. Normal bowel sounds heard. Central nervous system: Alert and oriented. No focal neurological deficits. Extremities: Symmetric 5 x 5 power. Skin: No rashes, lesions or ulcers. Psychiatry: Judgement and insight appear normal. Mood & affect appropriate.   Data Reviewed: I have personally reviewed following labs and imaging studies  CBC: Recent Labs  Lab 02/26/24 1744 02/27/24 0458  WBC 7.8 7.8  NEUTROABS 5.2  --   HGB 15.7* 13.3  HCT 47.3* 40.5  MCV 92.9 95.7  PLT 285 260    Basic Metabolic Panel: Recent Labs  Lab 02/26/24 1744 02/27/24 0458  NA 129* 139  K 3.6 3.5  CL 94* 102  CO2 25 26  GLUCOSE 95 94  BUN 10 9  CREATININE 0.70 0.62  CALCIUM 8.5* 8.8*    CBG: No results for input(s): GLUCAP in the last 168 hours.  Recent Results (from the past 240 hours)  Blood Culture (routine x 2)     Status: None (Preliminary result)   Collection Time: 02/26/24  6:19 PM   Specimen: BLOOD LEFT FOREARM  Result Value Ref Range Status   Specimen Description BLOOD LEFT FOREARM  Final   Special Requests NONE  Final   Culture   Final    NO GROWTH < 12 HOURS Performed at [facility], [address], [place], [place] [address]    Report Status PENDING  Incomplete  Resp panel by RT-PCR (RSV, Flu A&B, Covid) Anterior Nasal Swab     Status: None   Collection Time: 02/26/24  6:20 PM   Specimen: Anterior Nasal Swab  Result Value Ref Range Status   SARS Coronavirus 2 by RT PCR NEGATIVE NEGATIVE Final    Comment: (NOTE) SARS-CoV-2 target nucleic acids are NOT DETECTED.  The SARS-CoV-2 RNA is generally detectable in upper respiratory specimens during the acute phase of infection. The lowest concentration of SARS-CoV-2 viral copies  this assay can detect is 138 copies/mL. A negative result does not preclude SARS-Cov-2 infection and should not be used as the sole basis for treatment or other patient management decisions. A negative result may occur with  improper specimen collection/handling, submission of specimen other than nasopharyngeal swab, presence of viral mutation(s) within the areas targeted by this assay, and inadequate number of viral copies(<138 copies/mL). A negative result must be combined with clinical observations, patient history, and epidemiological information. The expected result is Negative.  Fact Sheet for Patients:  BloggerCourse.com  Fact Sheet for Healthcare Providers:  SeriousBroker.it  This test is no t yet approved or cleared by the [place]  FDA and  has been authorized for detection and/or diagnosis of SARS-CoV-2 by FDA under an Emergency Use Authorization (EUA). This EUA will remain  in effect (meaning this test can be used) for the duration of the COVID-19 declaration under Section 564(b)(1) of the Act, 21 U.S.C.section 360bbb-3(b)(1), unless the authorization is terminated  or revoked sooner.       Influenza A by PCR NEGATIVE NEGATIVE Final  Influenza B by PCR NEGATIVE NEGATIVE Final    Comment: (NOTE) The Xpert Xpress SARS-CoV-2/FLU/RSV plus assay is intended as an aid in the diagnosis of influenza from Nasopharyngeal swab specimens and should not be used as a sole basis for treatment. Nasal washings and aspirates are unacceptable for Xpert Xpress SARS-CoV-2/FLU/RSV testing.  Fact Sheet for Patients: BloggerCourse.com  Fact Sheet for Healthcare Providers: SeriousBroker.it  This test is not yet approved or cleared by the [place]  FDA and has been authorized for detection and/or diagnosis of SARS-CoV-2 by FDA under an Emergency Use Authorization (EUA). This EUA  will remain in effect (meaning this test can be used) for the duration of the COVID-19 declaration under Section 564(b)(1) of the Act, 21 U.S.C. section 360bbb-3(b)(1), unless the authorization is terminated or revoked.     Resp Syncytial Virus by PCR NEGATIVE NEGATIVE Final    Comment: (NOTE) Fact Sheet for Patients: BloggerCourse.com  Fact Sheet for Healthcare Providers: SeriousBroker.it  This test is not yet approved or cleared by the [place]  FDA and has been authorized for detection and/or diagnosis of SARS-CoV-2 by FDA under an Emergency Use Authorization (EUA). This EUA will remain in effect (meaning this test can be used) for the duration of the COVID-19 declaration under Section 564(b)(1) of the Act, 21 U.S.C. section 360bbb-3(b)(1), unless the authorization is terminated or revoked.  Performed at [facility], [address], [place], [place] [address]   Blood Culture (routine x 2)     Status: None (Preliminary result)   Collection Time: 02/26/24  6:27 PM   Specimen: Left Antecubital; Blood  Result Value Ref Range Status   Specimen Description LEFT ANTECUBITAL  Final   Special Requests NONE  Final   Culture  Setup Time   Final    GRAM POSITIVE COCCI ANAEROBIC BOTTLE ONLY Gram Stain Report Called to,Read Back By and Verified With: NORLEEN HERO. ON 02/27/2024 @4 :18PM BY IVAR ECHEVARIA  Performed at [facility], [address] [place], [place] [address]    Culture [facility] POSITIVE COCCI  Final   Report Status PENDING  Incomplete     Radiology Studies: DG Chest Portable 1 View Result Date: 02/26/2024 CLINICAL DATA:  Low oxygen EXAM: PORTABLE CHEST 1 VIEW COMPARISON:  Chest x-ray 12/12/2023 FINDINGS: Patient is slightly rotated. There is prominence of soft tissues in the right paratracheal region. This may be accentuated by patient rotation, but mediastinal process is not excluded. Heart size is normal. The lungs are clear. There  is no pleural effusion or pneumothorax. No acute fractures are identified. IMPRESSION: Prominence of soft tissues in the right paratracheal region. This may be accentuated by patient rotation, but mediastinal process is not excluded. Recommend further evaluation with PA and lateral chest x-ray. Electronically Signed   By: Greig Pique M.D.   On: 02/26/2024 18:33    Scheduled Meds:  apixaban   5 mg Oral BID   divalproex   250 mg Oral BID   escitalopram   20 mg Oral QHS   feeding supplement  237 mL Oral BID BM   melatonin  3 mg Oral QHS   metoprolol  succinate  75 mg Oral Daily   OXcarbazepine   150 mg Oral BID   pantoprazole   40 mg Oral Daily   simvastatin   40 mg Oral QHS   Continuous Infusions:  cefTRIAXone  (ROCEPHIN )  IV     dextrose  5 % and 0.9 % NaCl       LOS: 1 day   Time spent: 55 mins  Emmely Bittinger  Vicci, MD How to contact the [facility] Attending or Consulting provider 7A - 7P or covering provider during after hours 7P -7A, for this patient?  Check the care team in [facility] and look for a) attending/consulting [facility] provider listed and b) the [facility] team listed Log into [email or web address] to find provider on call.  Locate the [facility] provider you are looking for under [facility] and page to a number that you can be directly reached. If you still have difficulty reaching the provider, please page the [facility] (Director on Call) for the Hospitalists listed on amion for assistance.  02/27/2024, 4:28 PM

## 2024-02-27 NOTE — Plan of Care (Signed)
  Problem: Acute Rehab PT Goals(only PT should resolve) Goal: Pt Will Go Supine/Side To Sit Outcome: Progressing Flowsheets (Taken 02/27/2024 1457) Pt will go Supine/Side to Sit: with moderate assist Goal: Patient Will Transfer Sit To/From Stand Outcome: Progressing Flowsheets (Taken 02/27/2024 1457) Patient will transfer sit to/from stand: with moderate assist Goal: Pt Will Transfer Bed To Chair/Chair To Bed Outcome: Progressing Flowsheets (Taken 02/27/2024 1457) Pt will Transfer Bed to Chair/Chair to Bed: with mod assist Goal: Pt Will Ambulate Outcome: Progressing Flowsheets (Taken 02/27/2024 1457) Pt will Ambulate:  with moderate assist  15 feet  25 feet  with rolling walker

## 2024-02-27 NOTE — Hospital Course (Addendum)
 83 y.o. female with medical history significant of depression, dementia, paroxysmal atrial fibrillation, seizures, GERD, hypertension, and hyperlipidemia who presented with altered mental status.  Recent hospitalization 08/02 to 01/17/24 for urinary tract infection, complicated with acute metabolic encephalopathy. She was diagnosed with E Coli urine infection and was treated with ceftriaxone , placed on prophylactic nitrofurantoin  and estrogen vaginal cream for recurrent urinary tract infections and was discharged to SNF.  Eventually patient was discharged home and was recovering well with home health services.   09/08 had outpatient follow up with primary care with no major changes to her medications.    This time she was noted by her daughter to have a mild change in her mentation about 3 days ago, being less responsive and having poor appetite. The following day she had generalized weakness and was difficult to transfer out of the bed, her mentation continue to worsen. Her urine had a strong odor. Today she was completely dependent to be moved. Because rapid progressive of her symptoms her daughter called EMS and she was brought her to the ED for further evaluation.    At the time of my examination she is able to respond simple questions, most of the information is from her daughter at the bedside.  At home uses walker for ambulation.  As the patient was treated with antibiotics, her mental status improved.  She did have waxing and waning episodes of mental status with some periods of less responsiveness.  There were no overt episodes of seizure or staring spells.  The patient did have elevated ammonia which was felt to be due to her Depakote .  The case was discussed with the patient's outpatient neurologist, Dr. Darice Shivers.  The patient was treated with a few doses of lactulose  with improvement of her ammonia.  Ultimately, her mental status stabilized and was at baseline.  Decision was made to decrease  the patient's Depakote  slightly.  Her dose of Trileptal  was increased.  The patient finished 7 days of antibiotics for her UTI during the hospitalization.

## 2024-02-28 DIAGNOSIS — A4150 Gram-negative sepsis, unspecified: Secondary | ICD-10-CM | POA: Diagnosis not present

## 2024-02-28 DIAGNOSIS — I10 Essential (primary) hypertension: Secondary | ICD-10-CM | POA: Diagnosis not present

## 2024-02-28 DIAGNOSIS — N390 Urinary tract infection, site not specified: Secondary | ICD-10-CM | POA: Diagnosis not present

## 2024-02-28 DIAGNOSIS — G9341 Metabolic encephalopathy: Secondary | ICD-10-CM | POA: Diagnosis not present

## 2024-02-28 LAB — BLOOD CULTURE ID PANEL (REFLEXED) - BCID2

## 2024-02-28 MED ORDER — LACTATED RINGERS IV BOLUS
500.0000 mL | Freq: Once | INTRAVENOUS | Status: DC
Start: 2024-02-28 — End: 2024-02-28

## 2024-02-28 MED ORDER — METOPROLOL SUCCINATE ER 50 MG PO TB24
50.0000 mg | ORAL_TABLET | Freq: Every day | ORAL | Status: DC
Start: 2024-02-29 — End: 2024-03-02
  Administered 2024-02-29 – 2024-03-01 (×2): 50 mg via ORAL
  Filled 2024-02-28 (×4): qty 1

## 2024-02-28 MED ORDER — SODIUM CHLORIDE 0.9 % IV SOLN
2.0000 g | Freq: Two times a day (BID) | INTRAVENOUS | Status: DC
  Administered 2024-02-28 – 2024-03-01 (×4): 2 g via INTRAVENOUS
  Filled 2024-02-28 (×4): qty 12.5

## 2024-02-28 NOTE — NC FL2 (Signed)
 [place]  MEDICAID FL2 LEVEL OF CARE FORM     IDENTIFICATION  Patient Name: Donna Barron Birthdate: [birth year] Sex: female Admission Date (Current Location): 02/26/2024  [facility] and [place] Number:  [employer] and Address:  [facility],  [address], [place] [address]      Provider Number: [id number]  Attending Physician Name and Address:  Vicci Afton CROME, MD  Relative Name and Phone Number:  Kariann, Wecker (Daughter)  [phone]    Current Level of Care: Hospital Recommended Level of Care: Skilled Nursing Facility Prior Approval Number:    Date Approved/Denied:   PASRR Number: [id number]  Discharge Plan: SNF    Current Diagnoses: Patient Active Problem List   Diagnosis Date Noted   Sepsis secondary to UTI (HCC) 02/26/2024   At risk for central sleep apnea 01/17/2024   Encephalopathy acute 01/14/2024   Seizure disorder (HCC) 01/14/2024   Sepsis due to gram-negative UTI (HCC) 12/12/2023   Bilateral lower extremity edema 11/15/2023   Paroxysmal atrial fibrillation (HCC) 11/02/2023   Acute metabolic encephalopathy 10/13/2023   Memory loss 09/24/2021   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, without behavioral disturbance (HCC) 09/24/2021   Status post Nissen fundoplication Oct 2021 03/24/2020   Status post laparoscopic Nissen fundoplication 03/24/2020   Overactive bladder 12/04/2019   Dysphagia 09/11/2019   S/P left rotator cuff repair 06/15/18 06/15/2018   Nontraumatic complete tear of left rotator cuff    Obesity, class 1 05/23/2018   S/P carpal tunnel and DeQuervains release right 07/14/17 07/20/2017   Carpal tunnel syndrome of right wrist    De Quervain's disease (tenosynovitis)    Essential hypertension 11/15/2013   Palpitations 06/28/2013   Mixed hyperlipidemia 06/28/2013   Obesity, unspecified 06/28/2013    Orientation RESPIRATION BLADDER Height & Weight     Self  Normal External catheter  Weight: 82.1 kg Height:  5' 2 (157.5 cm)  BEHAVIORAL SYMPTOMS/MOOD NEUROLOGICAL BOWEL NUTRITION STATUS      Continent Diet (See DC summary)  AMBULATORY STATUS COMMUNICATION OF NEEDS Skin   Extensive Assist Verbally Other (Comment) (Sacral redness added dressing)                       Personal Care Assistance Level of Assistance  Bathing, Feeding, Dressing Bathing Assistance: Maximum assistance Feeding assistance: Limited assistance Dressing Assistance: Maximum assistance     Functional Limitations Info  Sight, Hearing, Speech Sight Info: Adequate Hearing Info: Adequate Speech Info: Adequate    SPECIAL CARE FACTORS FREQUENCY  PT (By licensed PT)     PT Frequency: 5 times a week              Contractures Contractures Info: Not present    Additional Factors Info  Code Status, Allergies Code Status Info: Full Allergies Info: Codeine , Hydrocodone , Avelox, Oxybutynin           Current Medications (02/28/2024):  This is the current hospital active medication list Current Facility-Administered Medications  Medication Dose Route Frequency Provider Last Rate Last Admin   acetaminophen  (TYLENOL ) tablet 650 mg  650 mg Oral Q6H PRN Arrien, Elidia Sieving, MD       Or   acetaminophen  (TYLENOL ) suppository 650 mg  650 mg Rectal Q6H PRN Arrien, Elidia Sieving, MD       apixaban  (ELIQUIS ) tablet 5 mg  5 mg Oral BID Arrien, Mauricio Daniel, MD   5 mg at 02/28/24 0830   cefTRIAXone  (ROCEPHIN ) 2 g in sodium  chloride 0.9 % 100 mL IVPB  2 g Intravenous Q24H Vicci, Clanford L, MD   Stopped at 02/27/24 1732   dextrose  5 %-0.9 % sodium chloride  infusion   Intravenous Continuous Vicci, Clanford L, MD 75 mL/hr at 02/27/24 2034 Infusion Verify at 02/27/24 2034   divalproex  (DEPAKOTE  SPRINKLE) capsule 250 mg  250 mg Oral BID Arrien, Mauricio Daniel, MD   250 mg at 02/28/24 0830   escitalopram  (LEXAPRO ) tablet 20 mg  20 mg Oral QHS Arrien, Mauricio Daniel, MD   20 mg at 02/27/24  2211   feeding supplement (ENSURE PLUS HIGH PROTEIN) liquid 237 mL  237 mL Oral BID BM Johnson, Clanford L, MD   237 mL at 02/28/24 0830   melatonin tablet 3 mg  3 mg Oral QHS Arrien, Elidia Sieving, MD   3 mg at 02/27/24 2211   metoprolol  succinate (TOPROL -XL) 24 hr tablet 75 mg  75 mg Oral Daily Arrien, Mauricio Daniel, MD   75 mg at 02/28/24 0830   ondansetron  (ZOFRAN ) tablet 4 mg  4 mg Oral Q6H PRN Arrien, Mauricio Daniel, MD       Or   ondansetron  (ZOFRAN ) injection 4 mg  4 mg Intravenous Q6H PRN Arrien, Mauricio Daniel, MD       OXcarbazepine  (TRILEPTAL ) tablet 150 mg  150 mg Oral BID Arrien, Mauricio Daniel, MD   150 mg at 02/28/24 0830   pantoprazole  (PROTONIX ) EC tablet 40 mg  40 mg Oral Daily Arrien, Mauricio Daniel, MD   40 mg at 02/28/24 0830   simvastatin  (ZOCOR ) tablet 40 mg  40 mg Oral QHS Arrien, Mauricio Daniel, MD   40 mg at 02/27/24 2211   Facility-Administered Medications Ordered in Other Encounters  Medication Dose Route Frequency Provider Last Rate Last Admin   fentaNYL  (SUBLIMAZE ) injection 25-50 mcg  25-50 mcg Intravenous Q5 min PRN Jerrye Sharper, MD         Discharge Medications: Please see discharge summary for a list of discharge medications.  Relevant Imaging Results:  Relevant Lab Results:   Additional Information SS# [id number]  Sharlyne Stabs, RN

## 2024-02-28 NOTE — Progress Notes (Signed)
 Bilateral mitts places since pt confused and pulled out PIV and tele leads. Will continue to monitor.

## 2024-02-28 NOTE — Progress Notes (Signed)
 PROGRESS NOTE   Donna Barron  FMW:[id number] DOB: Oct 05, 1940 DOA: 02/26/2024 PCP: [name], NP   Chief Complaint  Patient presents with   Dysuria   Level of care: Telemetry  Brief Admission History:  83 y.o. female with medical history significant of depression, dementia, paroxysmal atrial fibrillation, seizures, GERD, hypertension, and hyperlipidemia who presented with altered mental status.  Recent hospitalization 08/02 to 01/17/24 for urinary tract infection, complicated with acute metabolic encephalopathy. She was diagnosed with E Coli urine infection and was treated with ceftriaxone , placed on prophylactic nitrofurantoin  and estrogen vaginal cream for recurrent urinary tract infections and was discharged to SNF.  Eventually patient was discharged home and was recovering well with home health services.   09/08 had outpatient follow up with primary care with no major changes to her medications.    This time she was noted by her daughter to have a mild change in her mentation about 3 days ago, being less responsive and having poor appetite. The following day she had generalized weakness and was difficult to transfer out of the bed, her mentation continue to worsen. Her urine had a strong odor. Today she was completely dependent to be moved. Because rapid progressive of her symptoms her daughter called EMS and she was brought her to the ED for further evaluation.    At the time of my examination she is able to respond simple questions, most of the information is from her daughter at the bedside.  At home uses walker for ambulation.    Assessment and Plan:  Pseudomonas UTI  Severe sepsis with tachypnea, hypotension and acute metabolic encephalopathy, (present on admission)  Recurrent urinary tract infections.  Last urine culture from 01/2024 positive for E coli sensitive to ceftriaxone , but resistant to ampicillin and ciprofloxacin , culture from 11/2023 was positive for VRE.  Urine  infection due to drug resistant gram negative bacteria IV cefepime  ordered 9/16>>  Gram positive Cocci Bacteremia -- was initially on IV high dose ceftriaxone  -- discontinued after 3 doses, changed to cefepime  with new finding of pseudomonas UTI -- staph epidermidis in 1/2 cultures is likely a contaminant  Seizure disorder  No signs of active seizures, continue with oxcarbazepine , and divalproex .  Neuro checks per unit protocol.   Paroxysmal atrial fibrillation  Continue metoprolol  for rate control and anticoagulation with apixaban .  Continue telemetry monitoring   Acute metabolic encephalopathy -- Improving  Patient improving back to baseline,  Plan to continue supportive medical care with IV fluids, IV antibiotics and neuro checks per unit protocol Will need PT and OT   Essential hypertension Continue blood pressure monitoring Hold on furosemide  and continue with metoprolol .   Obesity, class 1 Calculated BMI is 33.1   Mixed hyperlipidemia Continue statin therapy   DVT prophylaxis: apixaban  Code Status: full  Family Communication:  Disposition: anticipating SNF placement    Consultants:   Procedures:   Antimicrobials:  Ceftriaxone  2 gm daily 9/14>>9/16 Cefepime  9/16>>   Subjective: Pt reports dysuria symptoms and poor appetite  Objective: Vitals:   02/28/24 1315 02/28/24 1331 02/28/24 1350 02/28/24 1400  BP:  (!) 78/61 (!) 79/48 (!) 110/58  Pulse: 60 60    Resp: 15 14    Temp: 98.2 F (36.8 C) 98.2 F (36.8 C)    TempSrc: Oral Oral    SpO2: 98% 96%    Weight:      Height:        Intake/Output Summary (Last 24 hours) at 02/28/2024 1756 Last data filed  at 02/28/2024 0859 Gross per 24 hour  Intake 397.5 ml  Output 1800 ml  Net -1402.5 ml   Filed Weights   02/26/24 1723  Weight: 82.1 kg   Examination:  General exam: Appears calm and comfortable  Respiratory system: Clear to auscultation. Respiratory effort normal. Cardiovascular system: normal S1  & S2 heard. No JVD, murmurs, rubs, gallops or clicks. No pedal edema. Gastrointestinal system: Abdomen is nondistended, soft and nontender. No organomegaly or masses felt. Normal bowel sounds heard. Central nervous system: Alert and oriented. No focal neurological deficits. Extremities: Symmetric 5 x 5 power. Skin: No rashes, lesions or ulcers. Psychiatry: Judgement and insight appear poor. Mood & affect appropriate.   Data Reviewed: I have personally reviewed following labs and imaging studies  CBC: Recent Labs  Lab 02/26/24 1744 02/27/24 0458  WBC 7.8 7.8  NEUTROABS 5.2  --   HGB 15.7* 13.3  HCT 47.3* 40.5  MCV 92.9 95.7  PLT 285 260    Basic Metabolic Panel: Recent Labs  Lab 02/26/24 1744 02/27/24 0458  NA 129* 139  K 3.6 3.5  CL 94* 102  CO2 25 26  GLUCOSE 95 94  BUN 10 9  CREATININE 0.70 0.62  CALCIUM 8.5* 8.8*    CBG: No results for input(s): GLUCAP in the last 168 hours.  Recent Results (from the past 240 hours)  Urine Culture     Status: Abnormal (Preliminary result)   Collection Time: 02/26/24  5:16 PM   Specimen: Urine, Clean Catch  Result Value Ref Range Status   Specimen Description   Final    URINE, CLEAN CATCH Performed at [facility], [address], [place], [place] [address]    Special Requests   Final    NONE Performed at [facility], [address] [place], [place] [address]    Culture (A)  Final    30,000 COLONIES/mL PSEUDOMONAS AERUGINOSA SUSCEPTIBILITIES TO FOLLOW Performed at [facility], [address], [place], [place] [address]    Report Status PENDING  Incomplete  Blood Culture (routine x 2)     Status: None (Preliminary result)   Collection Time: 02/26/24  6:19 PM   Specimen: BLOOD LEFT FOREARM  Result Value Ref Range Status   Specimen Description   Final    BLOOD LEFT FOREARM Performed at [facility], [address] [place], [place] [address]    Special Requests   Final    NONE Performed at [facility], [address], [place], [place] [address]    Culture   Final    NO GROWTH 2 DAYS Performed at [facility], [address], [place], [place] [address]    Report Status PENDING  Incomplete  Resp panel by RT-PCR (RSV, Flu A&B, Covid) Anterior Nasal Swab     Status: None   Collection Time: 02/26/24  6:20 PM   Specimen: Anterior Nasal Swab  Result Value Ref Range Status   SARS Coronavirus 2 by RT PCR NEGATIVE NEGATIVE Final    Comment: (NOTE) SARS-CoV-2 target nucleic acids are NOT DETECTED.  The SARS-CoV-2 RNA is generally detectable in upper respiratory specimens during the acute phase of infection. The lowest concentration of SARS-CoV-2 viral copies this assay can detect is 138 copies/mL. A negative result does not preclude SARS-Cov-2 infection and should not be used as the sole basis for treatment or other patient management decisions. A negative result may occur with  improper specimen collection/handling, submission of specimen other than nasopharyngeal swab,  presence of viral mutation(s) within the areas targeted by this assay, and inadequate number of viral copies(<138 copies/mL). A negative result must be combined with clinical observations, patient history, and epidemiological information. The expected result is Negative.  Fact Sheet for Patients:  BloggerCourse.com  Fact Sheet for Healthcare Providers:  SeriousBroker.it  This test is no t yet approved or cleared by the [place]  FDA and  has been authorized for detection and/or diagnosis of SARS-CoV-2 by FDA under an Emergency Use Authorization (EUA). This EUA will remain  in effect (meaning this test can be used) for the duration of the COVID-19 declaration under Section 564(b)(1) of the Act, 21 U.S.C.section 360bbb-3(b)(1), unless the authorization is terminated  or revoked sooner.       Influenza A by PCR NEGATIVE NEGATIVE Final   Influenza B by PCR  NEGATIVE NEGATIVE Final    Comment: (NOTE) The Xpert Xpress SARS-CoV-2/FLU/RSV plus assay is intended as an aid in the diagnosis of influenza from Nasopharyngeal swab specimens and should not be used as a sole basis for treatment. Nasal washings and aspirates are unacceptable for Xpert Xpress SARS-CoV-2/FLU/RSV testing.  Fact Sheet for Patients: BloggerCourse.com  Fact Sheet for Healthcare Providers: SeriousBroker.it  This test is not yet approved or cleared by the [place]  FDA and has been authorized for detection and/or diagnosis of SARS-CoV-2 by FDA under an Emergency Use Authorization (EUA). This EUA will remain in effect (meaning this test can be used) for the duration of the COVID-19 declaration under Section 564(b)(1) of the Act, 21 U.S.C. section 360bbb-3(b)(1), unless the authorization is terminated or revoked.     Resp Syncytial Virus by PCR NEGATIVE NEGATIVE Final    Comment: (NOTE) Fact Sheet for Patients: BloggerCourse.com  Fact Sheet for Healthcare Providers: SeriousBroker.it  This test is not yet approved or cleared by the [place]  FDA and has been authorized for detection and/or diagnosis of SARS-CoV-2 by FDA under an Emergency Use Authorization (EUA). This EUA will remain in effect (meaning this test can be used) for the duration of the COVID-19 declaration under Section 564(b)(1) of the Act, 21 U.S.C. section 360bbb-3(b)(1), unless the authorization is terminated or revoked.  Performed at [facility], [address] [place], [place] [address]   Blood Culture (routine x 2)     Status: Abnormal (Preliminary result)   Collection Time: 02/26/24  6:27 PM   Specimen: Left Antecubital; Blood  Result Value Ref Range Status   Specimen Description   Final    LEFT ANTECUBITAL Performed at [facility], [address] [place], [place] [address]    Special  Requests   Final    NONE Performed at [facility], [address], [place], [place] [address]    Culture  Setup Time   Final    GRAM POSITIVE COCCI ANAEROBIC BOTTLE ONLY Gram Stain Report Called to,Read Back By and Verified With: JOHN M. ON 02/27/2024 @4 :18PM BY T. HAMER  CRITICAL RESULT CALLED TO, READ BACK BY AND VERIFIED WITH: RN ASHLEY H 0110 [id number] FCP    Culture (A)  Final    STAPHYLOCOCCUS EPIDERMIDIS THE SIGNIFICANCE OF ISOLATING THIS ORGANISM FROM A SINGLE SET OF BLOOD CULTURES WHEN MULTIPLE SETS ARE DRAWN IS UNCERTAIN. PLEASE NOTIFY THE MICROBIOLOGY DEPARTMENT WITHIN ONE WEEK IF SPECIATION AND SENSITIVITIES ARE REQUIRED. Performed at [facility], [address] [place], [place] [address]    Report Status PENDING  Incomplete  Blood Culture ID Panel (Reflexed)     Status: Abnormal  Collection Time: 02/26/24  6:27 PM  Result Value Ref Range Status   Enterococcus faecalis NOT DETECTED NOT DETECTED Final   Enterococcus Faecium NOT DETECTED NOT DETECTED Final   Listeria monocytogenes NOT DETECTED NOT DETECTED Final   Staphylococcus species DETECTED (A) NOT DETECTED Final    Comment: CRITICAL RESULT CALLED TO, READ BACK BY AND VERIFIED WITH: RN ASHLEY H 0110 [id number] FCP    Staphylococcus aureus (BCID) NOT DETECTED NOT DETECTED Final   Staphylococcus epidermidis DETECTED (A) NOT DETECTED Final    Comment: Methicillin (oxacillin) resistant coagulase negative staphylococcus. Possible blood culture contaminant (unless isolated from more than one blood culture draw or clinical case suggests pathogenicity). No antibiotic treatment is indicated for blood  culture contaminants. CRITICAL RESULT CALLED TO, READ BACK BY AND VERIFIED WITH: RN ASHLEY H 0110 [id number] FCP    Staphylococcus lugdunensis NOT DETECTED NOT DETECTED Final   Streptococcus species NOT DETECTED NOT DETECTED Final   Streptococcus agalactiae NOT DETECTED NOT DETECTED Final   Streptococcus pneumoniae NOT DETECTED  NOT DETECTED Final   Streptococcus pyogenes NOT DETECTED NOT DETECTED Final   A.calcoaceticus-baumannii NOT DETECTED NOT DETECTED Final   Bacteroides fragilis NOT DETECTED NOT DETECTED Final   Enterobacterales NOT DETECTED NOT DETECTED Final   Enterobacter cloacae complex NOT DETECTED NOT DETECTED Final   Escherichia coli NOT DETECTED NOT DETECTED Final   Klebsiella aerogenes NOT DETECTED NOT DETECTED Final   Klebsiella oxytoca NOT DETECTED NOT DETECTED Final   Klebsiella pneumoniae NOT DETECTED NOT DETECTED Final   Proteus species NOT DETECTED NOT DETECTED Final   Salmonella species NOT DETECTED NOT DETECTED Final   Serratia marcescens NOT DETECTED NOT DETECTED Final   Haemophilus influenzae NOT DETECTED NOT DETECTED Final   Neisseria meningitidis NOT DETECTED NOT DETECTED Final   Pseudomonas aeruginosa NOT DETECTED NOT DETECTED Final   Stenotrophomonas maltophilia NOT DETECTED NOT DETECTED Final   Candida albicans NOT DETECTED NOT DETECTED Final   Candida auris NOT DETECTED NOT DETECTED Final   Candida glabrata NOT DETECTED NOT DETECTED Final   Candida krusei NOT DETECTED NOT DETECTED Final   Candida parapsilosis NOT DETECTED NOT DETECTED Final   Candida tropicalis NOT DETECTED NOT DETECTED Final   Cryptococcus neoformans/gattii NOT DETECTED NOT DETECTED Final   Methicillin resistance mecA/C DETECTED (A) NOT DETECTED Final    Comment: CRITICAL RESULT CALLED TO, READ BACK BY AND VERIFIED WITH: RN ROSINA VEAR SPIKE [id number] FCP Performed at [facility], [address] [place], [place] [address]      Radiology Studies: DG Chest Portable 1 View Result Date: 02/26/2024 CLINICAL DATA:  Low oxygen EXAM: PORTABLE CHEST 1 VIEW COMPARISON:  Chest x-ray 12/12/2023 FINDINGS: Patient is slightly rotated. There is prominence of soft tissues in the right paratracheal region. This may be accentuated by patient rotation, but mediastinal process is not excluded. Heart size is normal. The lungs are  clear. There is no pleural effusion or pneumothorax. No acute fractures are identified. IMPRESSION: Prominence of soft tissues in the right paratracheal region. This may be accentuated by patient rotation, but mediastinal process is not excluded. Recommend further evaluation with PA and lateral chest x-ray. Electronically Signed   By: Greig Pique M.D.   On: 02/26/2024 18:33    Scheduled Meds:  apixaban   5 mg Oral BID   divalproex   250 mg Oral BID   escitalopram   20 mg Oral QHS   feeding supplement  237 mL Oral BID BM   melatonin  3 mg Oral  QHS   [START ON 02/29/2024] metoprolol  succinate  50 mg Oral Daily   OXcarbazepine   150 mg Oral BID   pantoprazole   40 mg Oral Daily   simvastatin   40 mg Oral QHS   Continuous Infusions:  cefTRIAXone  (ROCEPHIN )  IV 2 g (02/28/24 1657)     LOS: 2 days   Time spent: 55 mins  Fathima Bartl Vicci, MD How to contact the [facility] Attending or Consulting provider 7A - 7P or covering provider during after hours 7P -7A, for this patient?  Check the care team in [facility] and look for a) attending/consulting [facility] provider listed and b) the [facility] team listed Log into [email or web address] to find provider on call.  Locate the [facility] provider you are looking for under [facility] and page to a number that you can be directly reached. If you still have difficulty reaching the provider, please page the [facility] (Director on Call) for the Hospitalists listed on amion for assistance.  02/28/2024, 5:56 PM

## 2024-02-28 NOTE — Progress Notes (Signed)
 Pt labs has positive blood cultures, provider notified. Will continue to monitor.

## 2024-02-28 NOTE — Progress Notes (Addendum)
 Initial Nutrition Assessment  DOCUMENTATION CODES:   Obesity unspecified  INTERVENTION:   -Feeding assistance with meals -MVI with minerals daily -Continue regular diet -Ensure Plus High Protein po BID, each supplement provides 350 kcal and 20 grams of protein   NUTRITION DIAGNOSIS:   Increased nutrient needs related to acute illness as evidenced by estimated needs.  GOAL:   Patient will meet greater than or equal to 90% of their needs  MONITOR:   PO intake, Supplement acceptance  REASON FOR ASSESSMENT:   Malnutrition Screening Tool    ASSESSMENT:   Pt with medical history significant of depression, dementia, paroxysmal atrial fibrillation, seizures, GERD, hypertension, and hyperlipidemia who presented with altered mental status.   Recent hospitalization 08/02 to 01/17/24 for urinary tract infection, complicated with acute metabolic encephalopathy. She was diagnosed with E Coli urine infection and was treated with ceftriaxone , placed on prophylactic nitrofurantoin  and estrogen vaginal cream for recurrent urinary tract infections and was discharged to SNF.  Pt admitted with sepsis secondary to gram negative UTI and acute metabolic encepahlopathy.   Reviewed I/O's: +426 ml x 24 hours and +186 ml since admission  UOP: 600 ml x 24 hours  Pt unavailable at time of visit. Attempted to speak with pt via call to hospital room phone, however, unable to reach. RD unable to obtain further nutrition-related history or complete nutrition-focused physical exam at this time.    Case discussed in multidisciplinary rounds.   Per H&P, pt with change in mentation for 3 days PTA. She was less responsive, had poor appetite, and noticed foul smelling urine.   Per RN last night, pt with mitts secondary to confusion and pulling off tele leads. Pt with positive blood cultures.   Pt currently on a regular diet. Noted meal completions 50%. Case discussed with RN; pt consumed about 50% of  breakfast. Pt with fair appetite and typically eats better at lunch and dinner.   Reviewed wt hx; wt has been stable over the past 3 months. Per RN, pt with mild edema per RN assessment which may be masking true weight loss as well as fat and muscle depletions.  Per TOC notes, plan to discharge home with family. Pt is active with home health. Plan SNF vs home health in 1-2 days per MD.   Medications reviewed and include melatonin and protonix    Labs reviewed.   Diet Order:   Diet Order             Diet regular Room service appropriate? Yes; Fluid consistency: Thin  Diet effective now                   EDUCATION NEEDS:   No education needs have been identified at this time  Skin:  Skin Assessment: Reviewed RN Assessment  Last BM:  02/27/24  Height:   Ht Readings from Last 1 Encounters:  02/26/24 5' 2 (1.575 m)    Weight:   Wt Readings from Last 1 Encounters:  02/26/24 82.1 kg    Ideal Body Weight:  50 kg  BMI:  Body mass index is 33.11 kg/m.  Estimated Nutritional Needs:   Kcal:  1550-1750  Protein:  75-90 grams  Fluid:  1.5-1.7 L    Margery ORN, RD, LDN, CDCES Registered Dietitian III Certified Diabetes Care and Education Specialist If unable to reach this RD, please use RD Inpatient group chat on secure chat between hours of 8am-4 pm daily

## 2024-02-28 NOTE — Plan of Care (Signed)

## 2024-02-28 NOTE — TOC Initial Note (Addendum)
 Transition of Care [facility]) - Initial/Assessment Note    Patient Details  Name: Donna Barron MRN: [id number] Date of Birth: [birth year]  Transition of Care [facility]) CM/SW Contact:    Sharlyne Stabs, RN Phone Number: 02/28/2024, 10:32 AM  Clinical Narrative:         Patient admitted with sepsis due to gram negative UTI. PT is recommending SNF. Patient is active with [facility] for home health. CM at the bedside, patient oriented to self only. CM called her daughter, [name], she is agreeable to SNF and prefers [facility]. FL2 completed and sent out. TOC following. Patient will be medially ready in one to two days.          Addendum: [facility] will offer, however patient only has 10 days left. With day of admission and weekend, she will probably only get therapy 6 days. CM called [name] to explain. She understands, we discussed Assisted Living, how to check on Medicaid. They need to consider her LTC plan. She is agreeable and will do some checking.    Expected Discharge Plan: Skilled Nursing Facility Barriers to Discharge: Continued Medical Work up, SNF Pending bed offer   Patient Goals and CMS Choice Patient states their goals for this hospitalization and ongoing recovery are:: agreeable to SNF CMS Medicare.gov Compare Post Acute Care list provided to:: Patient Represenative (must comment) Choice offered to / list presented to : Adult Children [facility] ownership interest in [facility].provided to:: Adult Children    Expected Discharge Plan and Services      Living arrangements for the past 2 months: Single Family Home        HH Agency: [facility]   Prior Living Arrangements/Services Living arrangements for the past 2 months: Single Family Home Lives with:: Adult Children Patient language and need for interpreter reviewed:: Yes Do you feel safe going back to the place where you live?: Yes      Need for Family Participation in Patient Care: Yes (Comment) Care  giver support system in place?: Yes (comment) Current home services: DME Criminal Activity/Legal Involvement Pertinent to Current Situation/Hospitalization: No - Comment as needed  Activities of Daily Living   ADL Screening (condition at time of admission) Independently performs ADLs?: No Does the patient have a NEW difficulty with bathing/dressing/toileting/self-feeding that is expected to last >3 days?: Yes (Initiates electronic notice to provider for possible OT consult) Does the patient have a NEW difficulty with getting in/out of bed, walking, or climbing stairs that is expected to last >3 days?: Yes (Initiates electronic notice to provider for possible PT consult) Does the patient have a NEW difficulty with communication that is expected to last >3 days?: Yes (Initiates electronic notice to provider for possible SLP consult) Is the patient deaf or have difficulty hearing?: No Does the patient have difficulty seeing, even when wearing glasses/contacts?: No Does the patient have difficulty concentrating, remembering, or making decisions?: Yes  Permission Sought/Granted      Permission granted to share info w Relationship: Daughter    Emotional Assessment    Affect (typically observed): Accepting Orientation: : Oriented to Self Alcohol / Substance Use: Not Applicable Psych Involvement: No (comment)  Admission diagnosis:  Sepsis secondary to UTI (HCC) [A41.9, N39.0] Patient Active Problem List   Diagnosis Date Noted   Sepsis secondary to UTI (HCC) 02/26/2024   At risk for central sleep apnea 01/17/2024   Encephalopathy acute 01/14/2024   Seizure disorder (HCC) 01/14/2024   Sepsis due to gram-negative  UTI (HCC) 12/12/2023   Bilateral lower extremity edema 11/15/2023   Paroxysmal atrial fibrillation (HCC) 11/02/2023   Acute metabolic encephalopathy 10/13/2023   Memory loss 09/24/2021   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, without behavioral  disturbance (HCC) 09/24/2021   Status post Nissen fundoplication Oct 2021 03/24/2020   Status post laparoscopic Nissen fundoplication 03/24/2020   Overactive bladder 12/04/2019   Dysphagia 09/11/2019   S/P left rotator cuff repair 06/15/18 06/15/2018   Nontraumatic complete tear of left rotator cuff    Obesity, class 1 05/23/2018   S/P carpal tunnel and DeQuervains release right 07/14/17 07/20/2017   Carpal tunnel syndrome of right wrist    De Quervain's disease (tenosynovitis)    Essential hypertension 11/15/2013   Palpitations 06/28/2013   Mixed hyperlipidemia 06/28/2013   Obesity, unspecified 06/28/2013   PCP:  [name], NP Pharmacy:   [facility] [address] [place] - [id number] [place] [address] Phone: [phone] Fax: [phone]     Social Drivers of Health (SDOH) Social History: SDOH Screenings   Food Insecurity: No Food Insecurity (02/27/2024)  Housing: Unknown (02/27/2024)  Transportation Needs: No Transportation Needs (02/27/2024)  Utilities: Not At Risk (02/27/2024)  Financial Resource Strain: Low Risk  (08/11/2023)   Received from [facility]  Physical Activity: Unknown (01/13/2023)   Received from [facility]  Social Connections: Unknown (02/27/2024)  Recent Concern: Social Connections - Moderately Isolated (12/12/2023)  Stress: No Stress Concern Present (01/13/2023)   Received from [facility]  Tobacco Use: Low Risk  (02/26/2024)   SDOH Interventions:     Readmission Risk Interventions    02/28/2024   10:31 AM 10/13/2023    9:11 PM  Readmission Risk Prevention Plan  Post Dischage Appt  Complete  Medication Screening  Complete  Transportation Screening Complete Complete  PCP or Specialist Appt within 5-7 Days Not Complete   Home Care Screening Complete   Medication Review (RN CM) Complete

## 2024-02-29 DIAGNOSIS — A4150 Gram-negative sepsis, unspecified: Secondary | ICD-10-CM | POA: Diagnosis not present

## 2024-02-29 DIAGNOSIS — E66811 Obesity, class 1: Secondary | ICD-10-CM | POA: Diagnosis not present

## 2024-02-29 DIAGNOSIS — N390 Urinary tract infection, site not specified: Secondary | ICD-10-CM | POA: Diagnosis not present

## 2024-02-29 DIAGNOSIS — G9341 Metabolic encephalopathy: Secondary | ICD-10-CM | POA: Diagnosis not present

## 2024-02-29 LAB — URINE CULTURE: Culture: 30000 — AB

## 2024-02-29 LAB — CULTURE, BLOOD (ROUTINE X 2)

## 2024-02-29 NOTE — Progress Notes (Signed)
 PROGRESS NOTE  Donna Barron FMW:[id number] DOB: 1940/08/01 DOA: 02/26/2024 PCP: [name], NP  Brief History:  83 y.o. female with medical history significant of depression, dementia, paroxysmal atrial fibrillation, seizures, GERD, hypertension, and hyperlipidemia who presented with altered mental status.  Recent hospitalization 08/02 to 01/17/24 for urinary tract infection, complicated with acute metabolic encephalopathy. She was diagnosed with E Coli urine infection and was treated with ceftriaxone , placed on prophylactic nitrofurantoin  and estrogen vaginal cream for recurrent urinary tract infections and was discharged to SNF.  Eventually patient was discharged home and was recovering well with home health services.   09/08 had outpatient follow up with primary care with no major changes to her medications.    This time she was noted by her daughter to have a mild change in her mentation about 3 days ago, being less responsive and having poor appetite. The following day she had generalized weakness and was difficult to transfer out of the bed, her mentation continue to worsen. Her urine had a strong odor. Today she was completely dependent to be moved. Because rapid progressive of her symptoms her daughter called EMS and she was brought her to the ED for further evaluation.    At the time of my examination she is able to respond simple questions, most of the information is from her daughter at the bedside.  At home uses walker for ambulation.    Assessment/Plan: Severe Sepsis/Pseudomonas UTI  -present on admission -Severe sepsis with tachypnea, hypotension and acute metabolic encephalopathy, (present on admission)  -01/2024 positive for E coli sensitive to ceftriaxone , but resistant to ampicillin and ciprofloxacin ,  -initially on ceftriaxone  IV cefepime  ordered 9/16>>   Gram positive Cocci Bacteremia -- represents contaminant   Seizure disorder  -continue depakote  and  trileptal    Paroxysmal atrial fibrillation  -continue metoprolol  and apixaban   Major neurocognitive disorder - Continue Depakote  -continue lexapro    Acute metabolic encephalopathy -- Improving  -Patient improving back to baseline,  -due to infection   Essential hypertension -Hold  furosemide  and continue with metoprolol .    Obesity, class 1 -BMI is 33.11    Mixed hyperlipidemia -Continue statin t         Family Communication:  no Family at bedside  Consultants:  none  Code Status:  FULL   DVT Prophylaxis:  apixaban    Procedures: As Listed in Progress Note Above  Antibiotics: Cefepime  9/16>>      Subjective: Patient denies fevers, chills, headache, chest pain, dyspnea, nausea, vomiting, diarrhea, abdominal pain, dysuria Objective: Vitals:   02/28/24 2101 02/29/24 0642 02/29/24 1431 02/29/24 1500  BP: 120/68 118/70  (!) 104/44  Pulse: 66 63 70   Resp: 16 18    Temp: 98.2 F (36.8 C) 98.9 F (37.2 C) 98.2 F (36.8 C)   TempSrc: Oral Oral Oral   SpO2: 98% 98% 95%   Weight:      Height:        Intake/Output Summary (Last 24 hours) at 02/29/2024 1808 Last data filed at 02/29/2024 0915 Gross per 24 hour  Intake 580 ml  Output 750 ml  Net -170 ml   Weight change:  Exam:  General:  Pt is alert, follows commands appropriately, not in acute distress HEENT: No icterus, No thrush, No neck mass, [place]/AT Cardiovascular: RRR, S1/S2, no rubs, no gallops Respiratory: CTA bilaterally, no wheezing, no crackles, no rhonchi Abdomen: Soft/+BS, non tender, non distended, no guarding Extremities: No edema,  No lymphangitis, No petechiae, No rashes, no synovitis   Data Reviewed: I have personally reviewed following labs and imaging studies Basic Metabolic Panel: Recent Labs  Lab 02/26/24 1744 02/27/24 0458  NA 129* 139  K 3.6 3.5  CL 94* 102  CO2 25 26  GLUCOSE 95 94  BUN 10 9  CREATININE 0.70 0.62  CALCIUM 8.5* 8.8*   Liver Function Tests: Recent  Labs  Lab 02/26/24 1744  AST 17  ALT 9  ALKPHOS 83  BILITOT 0.5  PROT 6.9  ALBUMIN 3.3*   No results for input(s): LIPASE, AMYLASE in the last 168 hours. No results for input(s): AMMONIA in the last 168 hours. Coagulation Profile: Recent Labs  Lab 02/26/24 1852  INR 1.2   CBC: Recent Labs  Lab 02/26/24 1744 02/27/24 0458  WBC 7.8 7.8  NEUTROABS 5.2  --   HGB 15.7* 13.3  HCT 47.3* 40.5  MCV 92.9 95.7  PLT 285 260   Cardiac Enzymes: No results for input(s): CKTOTAL, CKMB, CKMBINDEX, TROPONINI in the last 168 hours. BNP: Invalid input(s): POCBNP CBG: No results for input(s): GLUCAP in the last 168 hours. HbA1C: No results for input(s): HGBA1C in the last 72 hours. Urine analysis:    Component Value Date/Time   COLORURINE YELLOW 02/26/2024 1716   APPEARANCEUR CLOUDY (A) 02/26/2024 1716   LABSPEC 1.016 02/26/2024 1716   PHURINE 7.0 02/26/2024 1716   GLUCOSEU NEGATIVE 02/26/2024 1716   HGBUR SMALL (A) 02/26/2024 1716   BILIRUBINUR NEGATIVE 02/26/2024 1716   KETONESUR 5 (A) 02/26/2024 1716   PROTEINUR 30 (A) 02/26/2024 1716   NITRITE NEGATIVE 02/26/2024 1716   LEUKOCYTESUR MODERATE (A) 02/26/2024 1716   Sepsis Labs: @LABRCNTIP (procalcitonin:4,lacticidven:4) ) Recent Results (from the past 240 hours)  Urine Culture     Status: Abnormal   Collection Time: 02/26/24  5:16 PM   Specimen: Urine, Clean Catch  Result Value Ref Range Status   Specimen Description   Final    URINE, CLEAN CATCH Performed at [facility], [address], [place], [place] [address]    Special Requests   Final    NONE Performed at [facility], [address] [place], [place] [address]    Culture 30,000 COLONIES/mL PSEUDOMONAS AERUGINOSA (A)  Final   Report Status 02/29/2024 FINAL  Final   Organism ID, Bacteria PSEUDOMONAS AERUGINOSA (A)  Final      Susceptibility   Pseudomonas aeruginosa - MIC*    MEROPENEM 0.5 SENSITIVE Sensitive     CIPROFLOXACIN  <=0.06  SENSITIVE Sensitive     IMIPENEM <=0.5 SENSITIVE Sensitive     PIP/TAZO Value in next row Sensitive ug/mL     8 SENSITIVEThis is a modified FDA-approved test that has been validated and its performance characteristics determined by the reporting laboratory.  This laboratory is certified under the Clinical Laboratory Improvement Amendments CLIA as qualified to perform high complexity clinical laboratory testing.    CEFEPIME  Value in next row Sensitive      8 SENSITIVEThis is a modified FDA-approved test that has been validated and its performance characteristics determined by the reporting laboratory.  This laboratory is certified under the Clinical Laboratory Improvement Amendments CLIA as qualified to perform high complexity clinical laboratory testing.    CEFTAZIDIME/AVIBACTAM Value in next row Sensitive ug/mL     8 SENSITIVEThis is a modified FDA-approved test that has been validated and its performance characteristics determined by the reporting laboratory.  This laboratory is certified under the Clinical Laboratory Improvement Amendments CLIA as qualified  to perform high complexity clinical laboratory testing.    CEFTOLOZANE/TAZOBACTAM Value in next row Sensitive ug/mL     8 SENSITIVEThis is a modified FDA-approved test that has been validated and its performance characteristics determined by the reporting laboratory.  This laboratory is certified under the Clinical Laboratory Improvement Amendments CLIA as qualified to perform high complexity clinical laboratory testing.    * 30,000 COLONIES/mL PSEUDOMONAS AERUGINOSA  Blood Culture (routine x 2)     Status: None (Preliminary result)   Collection Time: 02/26/24  6:19 PM   Specimen: BLOOD LEFT FOREARM  Result Value Ref Range Status   Specimen Description BLOOD LEFT FOREARM  Final   Special Requests NONE  Final   Culture   Final    NO GROWTH 3 DAYS Performed at [facility], [address] [place], [place] [address]    Report Status PENDING   Incomplete  Resp panel by RT-PCR (RSV, Flu A&B, Covid) Anterior Nasal Swab     Status: None   Collection Time: 02/26/24  6:20 PM   Specimen: Anterior Nasal Swab  Result Value Ref Range Status   SARS Coronavirus 2 by RT PCR NEGATIVE NEGATIVE Final    Comment: (NOTE) SARS-CoV-2 target nucleic acids are NOT DETECTED.  The SARS-CoV-2 RNA is generally detectable in upper respiratory specimens during the acute phase of infection. The lowest concentration of SARS-CoV-2 viral copies this assay can detect is 138 copies/mL. A negative result does not preclude SARS-Cov-2 infection and should not be used as the sole basis for treatment or other patient management decisions. A negative result may occur with  improper specimen collection/handling, submission of specimen other than nasopharyngeal swab, presence of viral mutation(s) within the areas targeted by this assay, and inadequate number of viral copies(<138 copies/mL). A negative result must be combined with clinical observations, patient history, and epidemiological information. The expected result is Negative.  Fact Sheet for Patients:  BloggerCourse.com  Fact Sheet for Healthcare Providers:  SeriousBroker.it  This test is no t yet approved or cleared by the [place]  FDA and  has been authorized for detection and/or diagnosis of SARS-CoV-2 by FDA under an Emergency Use Authorization (EUA). This EUA will remain  in effect (meaning this test can be used) for the duration of the COVID-19 declaration under Section 564(b)(1) of the Act, 21 U.S.C.section 360bbb-3(b)(1), unless the authorization is terminated  or revoked sooner.       Influenza A by PCR NEGATIVE NEGATIVE Final   Influenza B by PCR NEGATIVE NEGATIVE Final    Comment: (NOTE) The Xpert Xpress SARS-CoV-2/FLU/RSV plus assay is intended as an aid in the diagnosis of influenza from Nasopharyngeal swab specimens and should  not be used as a sole basis for treatment. Nasal washings and aspirates are unacceptable for Xpert Xpress SARS-CoV-2/FLU/RSV testing.  Fact Sheet for Patients: BloggerCourse.com  Fact Sheet for Healthcare Providers: SeriousBroker.it  This test is not yet approved or cleared by the [place]  FDA and has been authorized for detection and/or diagnosis of SARS-CoV-2 by FDA under an Emergency Use Authorization (EUA). This EUA will remain in effect (meaning this test can be used) for the duration of the COVID-19 declaration under Section 564(b)(1) of the Act, 21 U.S.C. section 360bbb-3(b)(1), unless the authorization is terminated or revoked.     Resp Syncytial Virus by PCR NEGATIVE NEGATIVE Final    Comment: (NOTE) Fact Sheet for Patients: BloggerCourse.com  Fact Sheet for Healthcare Providers: SeriousBroker.it  This test is not yet approved or cleared by  the [place]  FDA and has been authorized for detection and/or diagnosis of SARS-CoV-2 by FDA under an Emergency Use Authorization (EUA). This EUA will remain in effect (meaning this test can be used) for the duration of the COVID-19 declaration under Section 564(b)(1) of the Act, 21 U.S.C. section 360bbb-3(b)(1), unless the authorization is terminated or revoked.  Performed at [facility], [address] [place], [place] [address]   Blood Culture (routine x 2)     Status: Abnormal   Collection Time: 02/26/24  6:27 PM   Specimen: Left Antecubital; Blood  Result Value Ref Range Status   Specimen Description   Final    LEFT ANTECUBITAL Performed at [facility], [address], [place], [place] [address]    Special Requests   Final    NONE Performed at [facility], [address], [place], [place] [address]    Culture  Setup Time   Final    GRAM POSITIVE COCCI ANAEROBIC BOTTLE ONLY Gram Stain Report Called to,Read  Back By and Verified With: JOHN M. ON 02/27/2024 @4 :18PM BY T. HAMER  CRITICAL RESULT CALLED TO, READ BACK BY AND VERIFIED WITH: RN ASHLEY H 0110 [id number] FCP    Culture (A)  Final    STAPHYLOCOCCUS EPIDERMIDIS THE SIGNIFICANCE OF ISOLATING THIS ORGANISM FROM A SINGLE SET OF BLOOD CULTURES WHEN MULTIPLE SETS ARE DRAWN IS UNCERTAIN. PLEASE NOTIFY THE MICROBIOLOGY DEPARTMENT WITHIN ONE WEEK IF SPECIATION AND SENSITIVITIES ARE REQUIRED. Performed at [facility], [address] [place], [place] [address]    Report Status 02/29/2024 FINAL  Final  Blood Culture ID Panel (Reflexed)     Status: Abnormal   Collection Time: 02/26/24  6:27 PM  Result Value Ref Range Status   Enterococcus faecalis NOT DETECTED NOT DETECTED Final   Enterococcus Faecium NOT DETECTED NOT DETECTED Final   Listeria monocytogenes NOT DETECTED NOT DETECTED Final   Staphylococcus species DETECTED (A) NOT DETECTED Final    Comment: CRITICAL RESULT CALLED TO, READ BACK BY AND VERIFIED WITH: RN ASHLEY H 0110 [id number] FCP    Staphylococcus aureus (BCID) NOT DETECTED NOT DETECTED Final   Staphylococcus epidermidis DETECTED (A) NOT DETECTED Final    Comment: Methicillin (oxacillin) resistant coagulase negative staphylococcus. Possible blood culture contaminant (unless isolated from more than one blood culture draw or clinical case suggests pathogenicity). No antibiotic treatment is indicated for blood  culture contaminants. CRITICAL RESULT CALLED TO, READ BACK BY AND VERIFIED WITH: RN ASHLEY H 0110 [id number] FCP    Staphylococcus lugdunensis NOT DETECTED NOT DETECTED Final   Streptococcus species NOT DETECTED NOT DETECTED Final   Streptococcus agalactiae NOT DETECTED NOT DETECTED Final   Streptococcus pneumoniae NOT DETECTED NOT DETECTED Final   Streptococcus pyogenes NOT DETECTED NOT DETECTED Final   A.calcoaceticus-baumannii NOT DETECTED NOT DETECTED Final   Bacteroides fragilis NOT DETECTED NOT DETECTED Final    Enterobacterales NOT DETECTED NOT DETECTED Final   Enterobacter cloacae complex NOT DETECTED NOT DETECTED Final   Escherichia coli NOT DETECTED NOT DETECTED Final   Klebsiella aerogenes NOT DETECTED NOT DETECTED Final   Klebsiella oxytoca NOT DETECTED NOT DETECTED Final   Klebsiella pneumoniae NOT DETECTED NOT DETECTED Final   Proteus species NOT DETECTED NOT DETECTED Final   Salmonella species NOT DETECTED NOT DETECTED Final   Serratia marcescens NOT DETECTED NOT DETECTED Final   Haemophilus influenzae NOT DETECTED NOT DETECTED Final   Neisseria meningitidis NOT DETECTED NOT DETECTED Final   Pseudomonas aeruginosa NOT DETECTED NOT DETECTED Final  Stenotrophomonas maltophilia NOT DETECTED NOT DETECTED Final   Candida albicans NOT DETECTED NOT DETECTED Final   Candida auris NOT DETECTED NOT DETECTED Final   Candida glabrata NOT DETECTED NOT DETECTED Final   Candida krusei NOT DETECTED NOT DETECTED Final   Candida parapsilosis NOT DETECTED NOT DETECTED Final   Candida tropicalis NOT DETECTED NOT DETECTED Final   Cryptococcus neoformans/gattii NOT DETECTED NOT DETECTED Final   Methicillin resistance mecA/C DETECTED (A) NOT DETECTED Final    Comment: CRITICAL RESULT CALLED TO, READ BACK BY AND VERIFIED WITH: RN ROSINA VEAR SPIKE [id number] FCP Performed at [facility], [address] [place], [place] [address]      Scheduled Meds:  apixaban   5 mg Oral BID   divalproex   250 mg Oral BID   escitalopram   20 mg Oral QHS   feeding supplement  237 mL Oral BID BM   melatonin  3 mg Oral QHS   metoprolol  succinate  50 mg Oral Daily   OXcarbazepine   150 mg Oral BID   pantoprazole   40 mg Oral Daily   simvastatin   40 mg Oral QHS   Continuous Infusions:  ceFEPime  (MAXIPIME ) IV 2 g (02/29/24 1123)    Procedures/Studies: DG Chest Portable 1 View Result Date: 02/26/2024 CLINICAL DATA:  Low oxygen EXAM: PORTABLE CHEST 1 VIEW COMPARISON:  Chest x-ray 12/12/2023 FINDINGS: Patient is slightly  rotated. There is prominence of soft tissues in the right paratracheal region. This may be accentuated by patient rotation, but mediastinal process is not excluded. Heart size is normal. The lungs are clear. There is no pleural effusion or pneumothorax. No acute fractures are identified. IMPRESSION: Prominence of soft tissues in the right paratracheal region. This may be accentuated by patient rotation, but mediastinal process is not excluded. Recommend further evaluation with PA and lateral chest x-ray. Electronically Signed   By: Greig Pique M.D.   On: 02/26/2024 18:33    Alm Schneider, DO  [facility]  If 7PM-7AM, please contact night-coverage [email or web address] Password [email or web address] 02/29/2024, 6:08 PM   LOS: 3 days

## 2024-02-29 NOTE — Plan of Care (Signed)

## 2024-03-01 ENCOUNTER — Inpatient Hospital Stay (HOSPITAL_COMMUNITY)

## 2024-03-01 DIAGNOSIS — N390 Urinary tract infection, site not specified: Secondary | ICD-10-CM | POA: Diagnosis not present

## 2024-03-01 DIAGNOSIS — A4150 Gram-negative sepsis, unspecified: Secondary | ICD-10-CM | POA: Diagnosis not present

## 2024-03-01 DIAGNOSIS — G40909 Epilepsy, unspecified, not intractable, without status epilepticus: Secondary | ICD-10-CM | POA: Diagnosis not present

## 2024-03-01 DIAGNOSIS — E66811 Obesity, class 1: Secondary | ICD-10-CM | POA: Diagnosis not present

## 2024-03-01 LAB — TSH: TSH: 1.228 u[IU]/mL (ref 0.350–4.500)

## 2024-03-01 LAB — COMPREHENSIVE METABOLIC PANEL WITH GFR
ALT: 7 U/L (ref 0–44)
AST: 14 U/L — ABNORMAL LOW (ref 15–41)
Albumin: 2.5 g/dL — ABNORMAL LOW (ref 3.5–5.0)
Alkaline Phosphatase: 68 U/L (ref 38–126)
Anion gap: 12 (ref 5–15)
BUN: 9 mg/dL (ref 8–23)
CO2: 24 mmol/L (ref 22–32)
Calcium: 8.6 mg/dL — ABNORMAL LOW (ref 8.9–10.3)
Chloride: 103 mmol/L (ref 98–111)
Creatinine, Ser: 0.5 mg/dL (ref 0.44–1.00)
GFR, Estimated: >60 mL/min (ref >60)
Glucose, Bld: 137 mg/dL — ABNORMAL HIGH (ref 70–99)
Potassium: 3.4 mmol/L — ABNORMAL LOW (ref 3.5–5.1)
Sodium: 139 mmol/L (ref 135–145)
Total Bilirubin: 0.5 mg/dL (ref 0.0–1.2)
Total Protein: 5.6 g/dL — ABNORMAL LOW (ref 6.5–8.1)

## 2024-03-01 LAB — AMMONIA: Ammonia: 41 umol/L — ABNORMAL HIGH (ref 9–35)

## 2024-03-01 LAB — VITAMIN B12: Vitamin B-12: 469 pg/mL (ref 180–914)

## 2024-03-01 LAB — FOLATE: Folate: 17.7 ng/mL (ref >5.9)

## 2024-03-01 LAB — VALPROIC ACID LEVEL: Valproic Acid Lvl: 43 ug/mL — ABNORMAL LOW (ref 50–100)

## 2024-03-01 LAB — T4, FREE: Free T4: 0.94 ng/dL (ref 0.61–1.12)

## 2024-03-01 LAB — MAGNESIUM: Magnesium: 1.8 mg/dL (ref 1.7–2.4)

## 2024-03-01 MED ORDER — CIPROFLOXACIN HCL 250 MG PO TABS
500.0000 mg | ORAL_TABLET | Freq: Two times a day (BID) | ORAL | Status: DC
  Administered 2024-03-01 – 2024-03-05 (×8): 500 mg via ORAL
  Filled 2024-03-01 (×10): qty 2

## 2024-03-01 MED ORDER — POLYETHYLENE GLYCOL 3350 17 G PO PACK
17.0000 g | PACK | Freq: Every day | ORAL | Status: DC
  Administered 2024-03-01 – 2024-03-05 (×5): 17 g via ORAL
  Filled 2024-03-01 (×5): qty 1

## 2024-03-01 MED ORDER — CIPROFLOXACIN HCL 500 MG PO TABS: 500.0000 mg | ORAL_TABLET | Freq: Two times a day (BID) | ORAL | Status: DC

## 2024-03-01 MED ORDER — POTASSIUM CHLORIDE CRYS ER 20 MEQ PO TBCR
20.0000 meq | EXTENDED_RELEASE_TABLET | Freq: Once | ORAL | Status: AC
  Administered 2024-03-01: 20 meq via ORAL
  Filled 2024-03-01: qty 1

## 2024-03-01 MED ORDER — SENNA 8.6 MG PO TABS
2.0000 | ORAL_TABLET | Freq: Every day | ORAL | Status: DC
  Administered 2024-03-01 – 2024-03-05 (×5): 17.2 mg via ORAL
  Filled 2024-03-01 (×6): qty 2

## 2024-03-01 MED ORDER — VITAMIN C 500 MG PO TABS
500.0000 mg | ORAL_TABLET | Freq: Every day | ORAL | Status: DC
Start: 2024-03-01 — End: 2024-03-05
  Administered 2024-03-01 – 2024-03-05 (×5): 500 mg via ORAL
  Filled 2024-03-01 (×6): qty 1

## 2024-03-01 MED ORDER — METOPROLOL SUCCINATE ER 50 MG PO TB24: 50.0000 mg | ORAL_TABLET | Freq: Every day | ORAL | Status: DC

## 2024-03-01 NOTE — Plan of Care (Signed)

## 2024-03-01 NOTE — Progress Notes (Signed)
 PROGRESS NOTE  Donna Barron FMW:[id number] DOB: 1940/07/21 DOA: 02/26/2024 PCP: [name], NP  Brief History:  83 y.o. female with medical history significant of depression, dementia, paroxysmal atrial fibrillation, seizures, GERD, hypertension, and hyperlipidemia who presented with altered mental status.  Recent hospitalization 08/02 to 01/17/24 for urinary tract infection, complicated with acute metabolic encephalopathy. She was diagnosed with E Coli urine infection and was treated with ceftriaxone , placed on prophylactic nitrofurantoin  and estrogen vaginal cream for recurrent urinary tract infections and was discharged to SNF.  Eventually patient was discharged home and was recovering well with home health services.   09/08 had outpatient follow up with primary care with no major changes to her medications.    This time she was noted by her daughter to have a mild change in her mentation about 3 days ago, being less responsive and having poor appetite. The following day she had generalized weakness and was difficult to transfer out of the bed, her mentation continue to worsen. Her urine had a strong odor. Today she was completely dependent to be moved. Because rapid progressive of her symptoms her daughter called EMS and she was brought her to the ED for further evaluation.    At the time of my examination she is able to respond simple questions, most of the information is from her daughter at the bedside.  At home uses walker for ambulation.    Assessment/Plan: Severe Sepsis/Pseudomonas UTI  -present on admission -Severe sepsis with tachypnea, hypotension and acute metabolic encephalopathy, (present on admission)  -01/2024 positive for E coli sensitive to ceftriaxone , but resistant to ampicillin and ciprofloxacin ,  -initially on ceftriaxone  -IV cefepime  ordered 9/16>>9/18 -9/18 start po cipro  -sepsis physiology resolved   Gram positive Cocci Bacteremia -- represents  contaminant>>MRSE 1/2 sets   Seizure disorder  -continue depakote  and trileptal    Paroxysmal atrial fibrillation  -continue metoprolol  and apixaban    Major neurocognitive disorder - Continue Depakote  -continue lexapro    Acute metabolic encephalopathy -- Improving  -Patient overall improving -due to infection -9/18--daughter concerned pt is less interactive  -check ammonia (pt on depakote ) -check depakote  level -B12 -TSH -d/c cefepime >>start cipro    Essential hypertension -Hold  furosemide  and continue with metoprolol .    Obesity, class 1 -BMI is 33.11    Mixed hyperlipidemia -Continue statin        Family Communication:   daughter at bedside 9/18  Consultants:  none  Code Status:  FULL   DVT Prophylaxis:  apixaban    Procedures: As Listed in Progress Note Above  Antibiotics: Cefepime  9/16>>      Subjective: Patient denies fevers, chills, headache, chest pain, dyspnea, nausea, vomiting, diarrhea, abdominal pain, dysuria,    Objective: Vitals:   02/29/24 2127 02/29/24 2145 03/01/24 0410 03/01/24 1014  BP: (!) 101/51  (!) 105/34 (!) 111/42  Pulse: 69  (!) 52 76  Resp: 16 18 16 16   Temp: 98.3 F (36.8 C)  98 F (36.7 C)   TempSrc: Oral  Oral   SpO2: 97%  90% 93%  Weight:      Height:        Intake/Output Summary (Last 24 hours) at 03/01/2024 1102 Last data filed at 03/01/2024 9167 Gross per 24 hour  Intake 120 ml  Output 650 ml  Net -530 ml   Weight change:  Exam:  General:  Pt is alert, follows commands appropriately, not in acute distress HEENT: No icterus, No thrush, No neck  mass, [place]/AT Cardiovascular: RRR, S1/S2, no rubs, no gallops Respiratory: fine bibasilar rales. No wheeze Abdomen: Soft/+BS, non tender, non distended, no guarding Extremities: Nonpitting edema, No lymphangitis, No petechiae, No rashes, no synovitis   Data Reviewed: I have personally reviewed following labs and imaging studies Basic Metabolic Panel: Recent  Labs  Lab 02/26/24 1744 02/27/24 0458  NA 129* 139  K 3.6 3.5  CL 94* 102  CO2 25 26  GLUCOSE 95 94  BUN 10 9  CREATININE 0.70 0.62  CALCIUM 8.5* 8.8*   Liver Function Tests: Recent Labs  Lab 02/26/24 1744  AST 17  ALT 9  ALKPHOS 83  BILITOT 0.5  PROT 6.9  ALBUMIN 3.3*   No results for input(s): LIPASE, AMYLASE in the last 168 hours. No results for input(s): AMMONIA in the last 168 hours. Coagulation Profile: Recent Labs  Lab 02/26/24 1852  INR 1.2   CBC: Recent Labs  Lab 02/26/24 1744 02/27/24 0458  WBC 7.8 7.8  NEUTROABS 5.2  --   HGB 15.7* 13.3  HCT 47.3* 40.5  MCV 92.9 95.7  PLT 285 260   Cardiac Enzymes: No results for input(s): CKTOTAL, CKMB, CKMBINDEX, TROPONINI in the last 168 hours. BNP: Invalid input(s): POCBNP CBG: No results for input(s): GLUCAP in the last 168 hours. HbA1C: No results for input(s): HGBA1C in the last 72 hours. Urine analysis:    Component Value Date/Time   COLORURINE YELLOW 02/26/2024 1716   APPEARANCEUR CLOUDY (A) 02/26/2024 1716   LABSPEC 1.016 02/26/2024 1716   PHURINE 7.0 02/26/2024 1716   GLUCOSEU NEGATIVE 02/26/2024 1716   HGBUR SMALL (A) 02/26/2024 1716   BILIRUBINUR NEGATIVE 02/26/2024 1716   KETONESUR 5 (A) 02/26/2024 1716   PROTEINUR 30 (A) 02/26/2024 1716   NITRITE NEGATIVE 02/26/2024 1716   LEUKOCYTESUR MODERATE (A) 02/26/2024 1716   Sepsis Labs: @LABRCNTIP (procalcitonin:4,lacticidven:4) ) Recent Results (from the past 240 hours)  Urine Culture     Status: Abnormal   Collection Time: 02/26/24  5:16 PM   Specimen: Urine, Clean Catch  Result Value Ref Range Status   Specimen Description   Final    URINE, CLEAN CATCH Performed at [facility], [address] [place], [place] [address]    Special Requests   Final    NONE Performed at [facility], [address], [place], [place] [address]    Culture 30,000 COLONIES/mL PSEUDOMONAS AERUGINOSA (A)  Final   Report Status  02/29/2024 FINAL  Final   Organism ID, Bacteria PSEUDOMONAS AERUGINOSA (A)  Final      Susceptibility   Pseudomonas aeruginosa - MIC*    MEROPENEM 0.5 SENSITIVE Sensitive     CIPROFLOXACIN  <=0.06 SENSITIVE Sensitive     IMIPENEM <=0.5 SENSITIVE Sensitive     PIP/TAZO Value in next row Sensitive ug/mL     8 SENSITIVEThis is a modified FDA-approved test that has been validated and its performance characteristics determined by the reporting laboratory.  This laboratory is certified under the Clinical Laboratory Improvement Amendments CLIA as qualified to perform high complexity clinical laboratory testing.    CEFEPIME  Value in next row Sensitive      8 SENSITIVEThis is a modified FDA-approved test that has been validated and its performance characteristics determined by the reporting laboratory.  This laboratory is certified under the Clinical Laboratory Improvement Amendments CLIA as qualified to perform high complexity clinical laboratory testing.    CEFTAZIDIME/AVIBACTAM Value in next row Sensitive ug/mL     8 SENSITIVEThis is a modified FDA-approved test that  has been validated and its performance characteristics determined by the reporting laboratory.  This laboratory is certified under the Clinical Laboratory Improvement Amendments CLIA as qualified to perform high complexity clinical laboratory testing.    CEFTOLOZANE/TAZOBACTAM Value in next row Sensitive ug/mL     8 SENSITIVEThis is a modified FDA-approved test that has been validated and its performance characteristics determined by the reporting laboratory.  This laboratory is certified under the Clinical Laboratory Improvement Amendments CLIA as qualified to perform high complexity clinical laboratory testing.    * 30,000 COLONIES/mL PSEUDOMONAS AERUGINOSA  Blood Culture (routine x 2)     Status: None (Preliminary result)   Collection Time: 02/26/24  6:19 PM   Specimen: BLOOD LEFT FOREARM  Result Value Ref Range Status   Specimen  Description BLOOD LEFT FOREARM  Final   Special Requests NONE  Final   Culture   Final    NO GROWTH 4 DAYS Performed at [facility], [address], [place], [place] [address]    Report Status PENDING  Incomplete  Resp panel by RT-PCR (RSV, Flu A&B, Covid) Anterior Nasal Swab     Status: None   Collection Time: 02/26/24  6:20 PM   Specimen: Anterior Nasal Swab  Result Value Ref Range Status   SARS Coronavirus 2 by RT PCR NEGATIVE NEGATIVE Final    Comment: (NOTE) SARS-CoV-2 target nucleic acids are NOT DETECTED.  The SARS-CoV-2 RNA is generally detectable in upper respiratory specimens during the acute phase of infection. The lowest concentration of SARS-CoV-2 viral copies this assay can detect is 138 copies/mL. A negative result does not preclude SARS-Cov-2 infection and should not be used as the sole basis for treatment or other patient management decisions. A negative result may occur with  improper specimen collection/handling, submission of specimen other than nasopharyngeal swab, presence of viral mutation(s) within the areas targeted by this assay, and inadequate number of viral copies(<138 copies/mL). A negative result must be combined with clinical observations, patient history, and epidemiological information. The expected result is Negative.  Fact Sheet for Patients:  BloggerCourse.com  Fact Sheet for Healthcare Providers:  SeriousBroker.it  This test is no t yet approved or cleared by the [place]  FDA and  has been authorized for detection and/or diagnosis of SARS-CoV-2 by FDA under an Emergency Use Authorization (EUA). This EUA will remain  in effect (meaning this test can be used) for the duration of the COVID-19 declaration under Section 564(b)(1) of the Act, 21 U.S.C.section 360bbb-3(b)(1), unless the authorization is terminated  or revoked sooner.       Influenza A by PCR NEGATIVE NEGATIVE Final    Influenza B by PCR NEGATIVE NEGATIVE Final    Comment: (NOTE) The Xpert Xpress SARS-CoV-2/FLU/RSV plus assay is intended as an aid in the diagnosis of influenza from Nasopharyngeal swab specimens and should not be used as a sole basis for treatment. Nasal washings and aspirates are unacceptable for Xpert Xpress SARS-CoV-2/FLU/RSV testing.  Fact Sheet for Patients: BloggerCourse.com  Fact Sheet for Healthcare Providers: SeriousBroker.it  This test is not yet approved or cleared by the [place]  FDA and has been authorized for detection and/or diagnosis of SARS-CoV-2 by FDA under an Emergency Use Authorization (EUA). This EUA will remain in effect (meaning this test can be used) for the duration of the COVID-19 declaration under Section 564(b)(1) of the Act, 21 U.S.C. section 360bbb-3(b)(1), unless the authorization is terminated or revoked.     Resp Syncytial Virus by PCR NEGATIVE NEGATIVE Final  Comment: (NOTE) Fact Sheet for Patients: BloggerCourse.com  Fact Sheet for Healthcare Providers: SeriousBroker.it  This test is not yet approved or cleared by the [place]  FDA and has been authorized for detection and/or diagnosis of SARS-CoV-2 by FDA under an Emergency Use Authorization (EUA). This EUA will remain in effect (meaning this test can be used) for the duration of the COVID-19 declaration under Section 564(b)(1) of the Act, 21 U.S.C. section 360bbb-3(b)(1), unless the authorization is terminated or revoked.  Performed at [facility], [address], [place], [place] [address]   Blood Culture (routine x 2)     Status: Abnormal   Collection Time: 02/26/24  6:27 PM   Specimen: Left Antecubital; Blood  Result Value Ref Range Status   Specimen Description   Final    LEFT ANTECUBITAL Performed at [facility], [address], [place], [place] [address]    Special  Requests   Final    NONE Performed at [facility], [address], [place], [place] [address]    Culture  Setup Time   Final    GRAM POSITIVE COCCI ANAEROBIC BOTTLE ONLY Gram Stain Report Called to,Read Back By and Verified With: JOHN M. ON 02/27/2024 @4 :18PM BY T. HAMER  CRITICAL RESULT CALLED TO, READ BACK BY AND VERIFIED WITH: RN ASHLEY H 0110 [id number] FCP    Culture (A)  Final    STAPHYLOCOCCUS EPIDERMIDIS THE SIGNIFICANCE OF ISOLATING THIS ORGANISM FROM A SINGLE SET OF BLOOD CULTURES WHEN MULTIPLE SETS ARE DRAWN IS UNCERTAIN. PLEASE NOTIFY THE MICROBIOLOGY DEPARTMENT WITHIN ONE WEEK IF SPECIATION AND SENSITIVITIES ARE REQUIRED. Performed at [facility], [address] [place], [place] [address]    Report Status 02/29/2024 FINAL  Final  Blood Culture ID Panel (Reflexed)     Status: Abnormal   Collection Time: 02/26/24  6:27 PM  Result Value Ref Range Status   Enterococcus faecalis NOT DETECTED NOT DETECTED Final   Enterococcus Faecium NOT DETECTED NOT DETECTED Final   Listeria monocytogenes NOT DETECTED NOT DETECTED Final   Staphylococcus species DETECTED (A) NOT DETECTED Final    Comment: CRITICAL RESULT CALLED TO, READ BACK BY AND VERIFIED WITH: RN ASHLEY H 0110 [id number] FCP    Staphylococcus aureus (BCID) NOT DETECTED NOT DETECTED Final   Staphylococcus epidermidis DETECTED (A) NOT DETECTED Final    Comment: Methicillin (oxacillin) resistant coagulase negative staphylococcus. Possible blood culture contaminant (unless isolated from more than one blood culture draw or clinical case suggests pathogenicity). No antibiotic treatment is indicated for blood  culture contaminants. CRITICAL RESULT CALLED TO, READ BACK BY AND VERIFIED WITH: RN ASHLEY H 0110 [id number] FCP    Staphylococcus lugdunensis NOT DETECTED NOT DETECTED Final   Streptococcus species NOT DETECTED NOT DETECTED Final   Streptococcus agalactiae NOT DETECTED NOT DETECTED Final   Streptococcus pneumoniae NOT  DETECTED NOT DETECTED Final   Streptococcus pyogenes NOT DETECTED NOT DETECTED Final   A.calcoaceticus-baumannii NOT DETECTED NOT DETECTED Final   Bacteroides fragilis NOT DETECTED NOT DETECTED Final   Enterobacterales NOT DETECTED NOT DETECTED Final   Enterobacter cloacae complex NOT DETECTED NOT DETECTED Final   Escherichia coli NOT DETECTED NOT DETECTED Final   Klebsiella aerogenes NOT DETECTED NOT DETECTED Final   Klebsiella oxytoca NOT DETECTED NOT DETECTED Final   Klebsiella pneumoniae NOT DETECTED NOT DETECTED Final   Proteus species NOT DETECTED NOT DETECTED Final   Salmonella species NOT DETECTED NOT DETECTED Final   Serratia marcescens NOT DETECTED NOT DETECTED Final   Haemophilus  influenzae NOT DETECTED NOT DETECTED Final   Neisseria meningitidis NOT DETECTED NOT DETECTED Final   Pseudomonas aeruginosa NOT DETECTED NOT DETECTED Final   Stenotrophomonas maltophilia NOT DETECTED NOT DETECTED Final   Candida albicans NOT DETECTED NOT DETECTED Final   Candida auris NOT DETECTED NOT DETECTED Final   Candida glabrata NOT DETECTED NOT DETECTED Final   Candida krusei NOT DETECTED NOT DETECTED Final   Candida parapsilosis NOT DETECTED NOT DETECTED Final   Candida tropicalis NOT DETECTED NOT DETECTED Final   Cryptococcus neoformans/gattii NOT DETECTED NOT DETECTED Final   Methicillin resistance mecA/C DETECTED (A) NOT DETECTED Final    Comment: CRITICAL RESULT CALLED TO, READ BACK BY AND VERIFIED WITH: RN ROSINA VEAR SPIKE [id number] FCP Performed at [facility], [address] [place], [place] [address]      Scheduled Meds:  apixaban   5 mg Oral BID   ascorbic acid   500 mg Oral Daily   ciprofloxacin   500 mg Oral BID   divalproex   250 mg Oral BID   escitalopram   20 mg Oral QHS   feeding supplement  237 mL Oral BID BM   melatonin  3 mg Oral QHS   metoprolol  succinate  50 mg Oral Daily   OXcarbazepine   150 mg Oral BID   pantoprazole   40 mg Oral Daily   simvastatin   40 mg Oral  QHS   Continuous Infusions:  Procedures/Studies: DG Chest Portable 1 View Result Date: 02/26/2024 CLINICAL DATA:  Low oxygen EXAM: PORTABLE CHEST 1 VIEW COMPARISON:  Chest x-ray 12/12/2023 FINDINGS: Patient is slightly rotated. There is prominence of soft tissues in the right paratracheal region. This may be accentuated by patient rotation, but mediastinal process is not excluded. Heart size is normal. The lungs are clear. There is no pleural effusion or pneumothorax. No acute fractures are identified. IMPRESSION: Prominence of soft tissues in the right paratracheal region. This may be accentuated by patient rotation, but mediastinal process is not excluded. Recommend further evaluation with PA and lateral chest x-ray. Electronically Signed   By: Greig Pique M.D.   On: 02/26/2024 18:33    Alm Schneider, DO  [facility]  If 7PM-7AM, please contact night-coverage [email or web address] Password [email or web address] 03/01/2024, 11:02 AM   LOS: 4 days

## 2024-03-01 NOTE — Progress Notes (Signed)
 Physical Therapy Note  Patient Details  Name: Donna Barron MRN: [id number] Date of Birth: [birth year] Today's Date: 03/01/2024  Therapy attempted. Pt reported she was not feeling up to it today by may try tomorrow.    Vivian, Reyann Troop B 03/01/2024, 3:18 PM

## 2024-03-02 DIAGNOSIS — N390 Urinary tract infection, site not specified: Secondary | ICD-10-CM | POA: Diagnosis not present

## 2024-03-02 DIAGNOSIS — A4150 Gram-negative sepsis, unspecified: Secondary | ICD-10-CM | POA: Diagnosis not present

## 2024-03-02 DIAGNOSIS — E66811 Obesity, class 1: Secondary | ICD-10-CM | POA: Diagnosis not present

## 2024-03-02 DIAGNOSIS — G9341 Metabolic encephalopathy: Secondary | ICD-10-CM | POA: Diagnosis not present

## 2024-03-02 LAB — BASIC METABOLIC PANEL WITH GFR
Anion gap: 8 (ref 5–15)
BUN: 9 mg/dL (ref 8–23)
CO2: 27 mmol/L (ref 22–32)
Calcium: 8.9 mg/dL (ref 8.9–10.3)
Chloride: 104 mmol/L (ref 98–111)
Creatinine, Ser: 0.5 mg/dL (ref 0.44–1.00)
GFR, Estimated: >60 mL/min (ref >60)
Glucose, Bld: 91 mg/dL (ref 70–99)
Potassium: 3.8 mmol/L (ref 3.5–5.1)
Sodium: 139 mmol/L (ref 135–145)

## 2024-03-02 LAB — AMMONIA: Ammonia: 53 umol/L — ABNORMAL HIGH (ref 9–35)

## 2024-03-02 LAB — CULTURE, BLOOD (ROUTINE X 2): Culture: NO GROWTH

## 2024-03-02 LAB — MAGNESIUM: Magnesium: 1.9 mg/dL (ref 1.7–2.4)

## 2024-03-02 MED ORDER — OXCARBAZEPINE 300 MG PO TABS
300.0000 mg | ORAL_TABLET | Freq: Two times a day (BID) | ORAL | Status: DC
  Administered 2024-03-02 – 2024-03-05 (×6): 300 mg via ORAL
  Filled 2024-03-02 (×6): qty 1

## 2024-03-02 MED ORDER — LACTULOSE 10 GM/15ML PO SOLN
20.0000 g | Freq: Two times a day (BID) | ORAL | Status: AC
  Administered 2024-03-02 – 2024-03-03 (×4): 20 g via ORAL
  Filled 2024-03-02 (×4): qty 30

## 2024-03-02 MED ORDER — METOPROLOL TARTRATE 25 MG PO TABS
25.0000 mg | ORAL_TABLET | Freq: Two times a day (BID) | ORAL | Status: DC
  Administered 2024-03-02 – 2024-03-05 (×6): 25 mg via ORAL
  Filled 2024-03-02 (×7): qty 1

## 2024-03-02 NOTE — Progress Notes (Addendum)
 PROGRESS NOTE  Donna Barron FMW:[id number] DOB: 05-Dec-1940 DOA: 02/26/2024 PCP: [name], NP  Brief History:  83 y.o. female with medical history significant of depression, dementia, paroxysmal atrial fibrillation, seizures, GERD, hypertension, and hyperlipidemia who presented with altered mental status.  Recent hospitalization 08/02 to 01/17/24 for urinary tract infection, complicated with acute metabolic encephalopathy. She was diagnosed with E Coli urine infection and was treated with ceftriaxone , placed on prophylactic nitrofurantoin  and estrogen vaginal cream for recurrent urinary tract infections and was discharged to SNF.  Eventually patient was discharged home and was recovering well with home health services.   09/08 had outpatient follow up with primary care with no major changes to her medications.    This time she was noted by her daughter to have a mild change in her mentation about 3 days ago, being less responsive and having poor appetite. The following day she had generalized weakness and was difficult to transfer out of the bed, her mentation continue to worsen. Her urine had a strong odor. Today she was completely dependent to be moved. Because rapid progressive of her symptoms her daughter called EMS and she was brought her to the ED for further evaluation.    At the time of my examination she is able to respond simple questions, most of the information is from her daughter at the bedside.  At home uses walker for ambulation.    Assessment/Plan: Severe Sepsis/Pseudomonas UTI  -present on admission -Severe sepsis with tachypnea, hypotension and acute metabolic encephalopathy, (present on admission)  -01/2024 positive for E coli sensitive to ceftriaxone , but resistant to ampicillin and ciprofloxacin ,  -initially on ceftriaxone  -IV cefepime  ordered 9/16>>9/18 -9/18 start po cipro  -sepsis physiology resolved   Gram positive Cocci Bacteremia -- represents  contaminant>>MRSE 1/2 sets  Hyperammonemia -likely due to patient's depakote  -discussed with pt's neurologist, Dr. Darice Shivers -start lactulose , repeat ammonia in am - increase trileptal  to 300 mg bid as may need to decrease depakote    Seizure disorder  -continue depakote  and trileptal    Paroxysmal atrial fibrillation  -continue metoprolol  and apixaban    Major neurocognitive disorder - Continue Depakote  -continue lexapro    Acute metabolic encephalopathy -- Improving  -Patient overall improving but remains somewhat somnolent -due to infection, partly from hyperammonemia -9/18--daughter concerned pt is less interactive  -check ammonia (pt on depakote ) -check depakote  level -B12--469 -TSH--1.228 - folate 17.7 -d/c cefepime >>start cipro    Essential hypertension -Hold  furosemide  and continue with metoprolol .    Obesity, class 1 -BMI is 33.11    Mixed hyperlipidemia -Continue statin    Goals of Care -I spoke with pt's neurologist, Dr. Darice Shivers who updated me regarding patient -03/02/24--spoke with daughter--daughter endorses overall functional and cognitive decline since 04/2023 - We discussed the patient's current condition and her metabolic encephalopathy.  We discussed the patient's frequent hospitalizations and her high risk for repeated hospitalizations in the future.  We discussed that with each progressive hospitalization the patient will continue to experience functional decline and with her decreased neurologic reserve, she may continue to remain encephalopathic -We discussed what her mother would want if there was no improvement in her current functional and mental capacities and quality of life issues -Advance care planning, including the explanation and discussion of advance directives was carried out with the patient and family.  Code status including explanations of Full Code and DNR and alternatives were discussed in detail.  Discussion of end-of-life  issues including  but not limited palliative care, hospice care and the concept of hospice, other end-of-life care options, power of attorney for health care decisions, living wills, and physician orders for life-sustaining treatment were also discussed with the patient and family.  Total face to face time 26 minutes.          Family Communication:   daughter at bedside 9/19   Consultants:  none   Code Status:  FULL    DVT Prophylaxis:  apixaban      Procedures: As Listed in Progress Note Above   Antibiotics: Cefepime  9/16>>9/18 Cipro  9/18>>       Total time spent 50 minutes.  Greater than 50% spent face to face counseling and coordinating care.     Subjective: The patient denies any chest pain, shortness breath, abdominal pain, nausea, vomiting, diarrhea.  Objective: Vitals:   03/02/24 1333 03/02/24 1457 03/02/24 1500 03/02/24 1506  BP: (!) 104/41 (!) 91/37 (!) 112/49 (!) 104/47  Pulse: 71 70 73 64  Resp: 20     Temp: 98.5 F (36.9 C)     TempSrc: Oral     SpO2: 91%     Weight:      Height:        Intake/Output Summary (Last 24 hours) at 03/02/2024 1709 Last data filed at 03/01/2024 2039 Gross per 24 hour  Intake --  Output 700 ml  Net -700 ml   Weight change:  Exam:  General:  Pt is alert, follows commands appropriately, not in acute distress HEENT: No icterus, No thrush, No neck mass, [place]/AT Cardiovascular: RRR, S1/S2, no rubs, no gallops Respiratory: Bibasilar crackles.  No wheezing Abdomen: Soft/+BS, non tender, non distended, no guarding Extremities: Nonpitting edema, No lymphangitis, No petechiae, No rashes, no synovitis   Data Reviewed: I have personally reviewed following labs and imaging studies Basic Metabolic Panel: Recent Labs  Lab 02/26/24 1744 02/27/24 0458 03/01/24 1151 03/02/24 0321  NA 129* 139 139 139  K 3.6 3.5 3.4* 3.8  CL 94* 102 103 104  CO2 25 26 24 27   GLUCOSE 95 94 137* 91  BUN 10 9 9 9   CREATININE 0.70 0.62 0.50  0.50  CALCIUM 8.5* 8.8* 8.6* 8.9  MG  --   --  1.8 1.9   Liver Function Tests: Recent Labs  Lab 02/26/24 1744 03/01/24 1151  AST 17 14*  ALT 9 7  ALKPHOS 83 68  BILITOT 0.5 0.5  PROT 6.9 5.6*  ALBUMIN 3.3* 2.5*   No results for input(s): LIPASE, AMYLASE in the last 168 hours. Recent Labs  Lab 03/01/24 1151 03/02/24 0321  AMMONIA 41* 53*   Coagulation Profile: Recent Labs  Lab 02/26/24 1852  INR 1.2   CBC: Recent Labs  Lab 02/26/24 1744 02/27/24 0458  WBC 7.8 7.8  NEUTROABS 5.2  --   HGB 15.7* 13.3  HCT 47.3* 40.5  MCV 92.9 95.7  PLT 285 260   Cardiac Enzymes: No results for input(s): CKTOTAL, CKMB, CKMBINDEX, TROPONINI in the last 168 hours. BNP: Invalid input(s): POCBNP CBG: No results for input(s): GLUCAP in the last 168 hours. HbA1C: No results for input(s): HGBA1C in the last 72 hours. Urine analysis:    Component Value Date/Time   COLORURINE YELLOW 02/26/2024 1716   APPEARANCEUR CLOUDY (A) 02/26/2024 1716   LABSPEC 1.016 02/26/2024 1716   PHURINE 7.0 02/26/2024 1716   GLUCOSEU NEGATIVE 02/26/2024 1716   HGBUR SMALL (A) 02/26/2024 1716   BILIRUBINUR NEGATIVE 02/26/2024 1716   KETONESUR 5 (A)  02/26/2024 1716   PROTEINUR 30 (A) 02/26/2024 1716   NITRITE NEGATIVE 02/26/2024 1716   LEUKOCYTESUR MODERATE (A) 02/26/2024 1716   Sepsis Labs: @LABRCNTIP (procalcitonin:4,lacticidven:4) ) Recent Results (from the past 240 hours)  Urine Culture     Status: Abnormal   Collection Time: 02/26/24  5:16 PM   Specimen: Urine, Clean Catch  Result Value Ref Range Status   Specimen Description   Final    URINE, CLEAN CATCH Performed at [facility], [address], [place], [place] [address]    Special Requests   Final    NONE Performed at [facility], [address] [place], [place] [address]    Culture 30,000 COLONIES/mL PSEUDOMONAS AERUGINOSA (A)  Final   Report Status 02/29/2024 FINAL  Final   Organism ID, Bacteria PSEUDOMONAS  AERUGINOSA (A)  Final      Susceptibility   Pseudomonas aeruginosa - MIC*    MEROPENEM 0.5 SENSITIVE Sensitive     CIPROFLOXACIN  <=0.06 SENSITIVE Sensitive     IMIPENEM <=0.5 SENSITIVE Sensitive     PIP/TAZO Value in next row Sensitive ug/mL     8 SENSITIVEThis is a modified FDA-approved test that has been validated and its performance characteristics determined by the reporting laboratory.  This laboratory is certified under the Clinical Laboratory Improvement Amendments CLIA as qualified to perform high complexity clinical laboratory testing.    CEFEPIME  Value in next row Sensitive      8 SENSITIVEThis is a modified FDA-approved test that has been validated and its performance characteristics determined by the reporting laboratory.  This laboratory is certified under the Clinical Laboratory Improvement Amendments CLIA as qualified to perform high complexity clinical laboratory testing.    CEFTAZIDIME/AVIBACTAM Value in next row Sensitive ug/mL     8 SENSITIVEThis is a modified FDA-approved test that has been validated and its performance characteristics determined by the reporting laboratory.  This laboratory is certified under the Clinical Laboratory Improvement Amendments CLIA as qualified to perform high complexity clinical laboratory testing.    CEFTOLOZANE/TAZOBACTAM Value in next row Sensitive ug/mL     8 SENSITIVEThis is a modified FDA-approved test that has been validated and its performance characteristics determined by the reporting laboratory.  This laboratory is certified under the Clinical Laboratory Improvement Amendments CLIA as qualified to perform high complexity clinical laboratory testing.    * 30,000 COLONIES/mL PSEUDOMONAS AERUGINOSA  Blood Culture (routine x 2)     Status: None   Collection Time: 02/26/24  6:19 PM   Specimen: BLOOD LEFT FOREARM  Result Value Ref Range Status   Specimen Description BLOOD LEFT FOREARM  Final   Special Requests NONE  Final   Culture   Final     NO GROWTH 5 DAYS Performed at [facility], [address] [place], [place] [address]    Report Status 03/02/2024 FINAL  Final  Resp panel by RT-PCR (RSV, Flu A&B, Covid) Anterior Nasal Swab     Status: None   Collection Time: 02/26/24  6:20 PM   Specimen: Anterior Nasal Swab  Result Value Ref Range Status   SARS Coronavirus 2 by RT PCR NEGATIVE NEGATIVE Final    Comment: (NOTE) SARS-CoV-2 target nucleic acids are NOT DETECTED.  The SARS-CoV-2 RNA is generally detectable in upper respiratory specimens during the acute phase of infection. The lowest concentration of SARS-CoV-2 viral copies this assay can detect is 138 copies/mL. A negative result does not preclude SARS-Cov-2 infection and should not be used as the sole basis for treatment or other  patient management decisions. A negative result may occur with  improper specimen collection/handling, submission of specimen other than nasopharyngeal swab, presence of viral mutation(s) within the areas targeted by this assay, and inadequate number of viral copies(<138 copies/mL). A negative result must be combined with clinical observations, patient history, and epidemiological information. The expected result is Negative.  Fact Sheet for Patients:  BloggerCourse.com  Fact Sheet for Healthcare Providers:  SeriousBroker.it  This test is no t yet approved or cleared by the [place]  FDA and  has been authorized for detection and/or diagnosis of SARS-CoV-2 by FDA under an Emergency Use Authorization (EUA). This EUA will remain  in effect (meaning this test can be used) for the duration of the COVID-19 declaration under Section 564(b)(1) of the Act, 21 U.S.C.section 360bbb-3(b)(1), unless the authorization is terminated  or revoked sooner.       Influenza A by PCR NEGATIVE NEGATIVE Final   Influenza B by PCR NEGATIVE NEGATIVE Final    Comment: (NOTE) The Xpert Xpress  SARS-CoV-2/FLU/RSV plus assay is intended as an aid in the diagnosis of influenza from Nasopharyngeal swab specimens and should not be used as a sole basis for treatment. Nasal washings and aspirates are unacceptable for Xpert Xpress SARS-CoV-2/FLU/RSV testing.  Fact Sheet for Patients: BloggerCourse.com  Fact Sheet for Healthcare Providers: SeriousBroker.it  This test is not yet approved or cleared by the [place]  FDA and has been authorized for detection and/or diagnosis of SARS-CoV-2 by FDA under an Emergency Use Authorization (EUA). This EUA will remain in effect (meaning this test can be used) for the duration of the COVID-19 declaration under Section 564(b)(1) of the Act, 21 U.S.C. section 360bbb-3(b)(1), unless the authorization is terminated or revoked.     Resp Syncytial Virus by PCR NEGATIVE NEGATIVE Final    Comment: (NOTE) Fact Sheet for Patients: BloggerCourse.com  Fact Sheet for Healthcare Providers: SeriousBroker.it  This test is not yet approved or cleared by the [place]  FDA and has been authorized for detection and/or diagnosis of SARS-CoV-2 by FDA under an Emergency Use Authorization (EUA). This EUA will remain in effect (meaning this test can be used) for the duration of the COVID-19 declaration under Section 564(b)(1) of the Act, 21 U.S.C. section 360bbb-3(b)(1), unless the authorization is terminated or revoked.  Performed at [facility], [address], [place], [place] [address]   Blood Culture (routine x 2)     Status: Abnormal   Collection Time: 02/26/24  6:27 PM   Specimen: Left Antecubital; Blood  Result Value Ref Range Status   Specimen Description   Final    LEFT ANTECUBITAL Performed at [facility], [address], [place], [place] [address]    Special Requests   Final    NONE Performed at [facility], [address],  [place], [place] [address]    Culture  Setup Time   Final    GRAM POSITIVE COCCI ANAEROBIC BOTTLE ONLY Gram Stain Report Called to,Read Back By and Verified With: JOHN M. ON 02/27/2024 @4 :18PM BY T. HAMER  CRITICAL RESULT CALLED TO, READ BACK BY AND VERIFIED WITH: RN ASHLEY H 0110 [id number] FCP    Culture (A)  Final    STAPHYLOCOCCUS EPIDERMIDIS THE SIGNIFICANCE OF ISOLATING THIS ORGANISM FROM A SINGLE SET OF BLOOD CULTURES WHEN MULTIPLE SETS ARE DRAWN IS UNCERTAIN. PLEASE NOTIFY THE MICROBIOLOGY DEPARTMENT WITHIN ONE WEEK IF SPECIATION AND SENSITIVITIES ARE REQUIRED. Performed at [facility], [address] [place], [place] [address]  Report Status 02/29/2024 FINAL  Final  Blood Culture ID Panel (Reflexed)     Status: Abnormal   Collection Time: 02/26/24  6:27 PM  Result Value Ref Range Status   Enterococcus faecalis NOT DETECTED NOT DETECTED Final   Enterococcus Faecium NOT DETECTED NOT DETECTED Final   Listeria monocytogenes NOT DETECTED NOT DETECTED Final   Staphylococcus species DETECTED (A) NOT DETECTED Final    Comment: CRITICAL RESULT CALLED TO, READ BACK BY AND VERIFIED WITH: RN ASHLEY H 0110 [id number] FCP    Staphylococcus aureus (BCID) NOT DETECTED NOT DETECTED Final   Staphylococcus epidermidis DETECTED (A) NOT DETECTED Final    Comment: Methicillin (oxacillin) resistant coagulase negative staphylococcus. Possible blood culture contaminant (unless isolated from more than one blood culture draw or clinical case suggests pathogenicity). No antibiotic treatment is indicated for blood  culture contaminants. CRITICAL RESULT CALLED TO, READ BACK BY AND VERIFIED WITH: RN ASHLEY H 0110 [id number] FCP    Staphylococcus lugdunensis NOT DETECTED NOT DETECTED Final   Streptococcus species NOT DETECTED NOT DETECTED Final   Streptococcus agalactiae NOT DETECTED NOT DETECTED Final   Streptococcus pneumoniae NOT DETECTED NOT DETECTED Final   Streptococcus pyogenes NOT DETECTED NOT DETECTED  Final   A.calcoaceticus-baumannii NOT DETECTED NOT DETECTED Final   Bacteroides fragilis NOT DETECTED NOT DETECTED Final   Enterobacterales NOT DETECTED NOT DETECTED Final   Enterobacter cloacae complex NOT DETECTED NOT DETECTED Final   Escherichia coli NOT DETECTED NOT DETECTED Final   Klebsiella aerogenes NOT DETECTED NOT DETECTED Final   Klebsiella oxytoca NOT DETECTED NOT DETECTED Final   Klebsiella pneumoniae NOT DETECTED NOT DETECTED Final   Proteus species NOT DETECTED NOT DETECTED Final   Salmonella species NOT DETECTED NOT DETECTED Final   Serratia marcescens NOT DETECTED NOT DETECTED Final   Haemophilus influenzae NOT DETECTED NOT DETECTED Final   Neisseria meningitidis NOT DETECTED NOT DETECTED Final   Pseudomonas aeruginosa NOT DETECTED NOT DETECTED Final   Stenotrophomonas maltophilia NOT DETECTED NOT DETECTED Final   Candida albicans NOT DETECTED NOT DETECTED Final   Candida auris NOT DETECTED NOT DETECTED Final   Candida glabrata NOT DETECTED NOT DETECTED Final   Candida krusei NOT DETECTED NOT DETECTED Final   Candida parapsilosis NOT DETECTED NOT DETECTED Final   Candida tropicalis NOT DETECTED NOT DETECTED Final   Cryptococcus neoformans/gattii NOT DETECTED NOT DETECTED Final   Methicillin resistance mecA/C DETECTED (A) NOT DETECTED Final    Comment: CRITICAL RESULT CALLED TO, READ BACK BY AND VERIFIED WITH: RN ROSINA VEAR SPIKE [id number] FCP Performed at [facility], [address] [place], [place] [address]      Scheduled Meds:  apixaban   5 mg Oral BID   ascorbic acid   500 mg Oral Daily   ciprofloxacin   500 mg Oral BID   divalproex   250 mg Oral BID   escitalopram   20 mg Oral QHS   feeding supplement  237 mL Oral BID BM   lactulose   20 g Oral BID   melatonin  3 mg Oral QHS   metoprolol  tartrate  25 mg Oral BID   OXcarbazepine   300 mg Oral BID   pantoprazole   40 mg Oral Daily   polyethylene glycol  17 g Oral Daily   senna  2 tablet Oral Daily    simvastatin   40 mg Oral QHS   Continuous Infusions:  Procedures/Studies: CT HEAD WO CONTRAST ( ) Result Date: 03/01/2024 CLINICAL DATA:  Mental status change, unknown cause. EXAM: CT HEAD WITHOUT CONTRAST  TECHNIQUE: Contiguous axial images were obtained from the base of the skull through the vertex without intravenous contrast. RADIATION DOSE REDUCTION: This exam was performed according to the departmental dose-optimization program which includes automated exposure control, adjustment of the mA and/or kV according to patient size and/or use of iterative reconstruction technique. COMPARISON:  Head CT 01/14/2024 and MRI 01/15/2024 FINDINGS: Brain: There is no evidence of an acute infarct, intracranial hemorrhage, mass, midline shift, or extra-axial fluid collection. There is moderate cerebral atrophy. Patchy cerebral white matter hypodensities are similar to the are CT and are nonspecific but compatible with moderate chronic small vessel ischemic disease. Vascular: Atherosclerotic calcification.  No hyperdense vessel. Skull: No fracture or suspicious lesion. Sinuses/Orbits: Increased left sphenoid sinus fluid. Clear mastoid air cells. Bilateral cataract extraction. Other: None. IMPRESSION: 1. No evidence of acute intracranial abnormality. 2. Moderate chronic small vessel ischemic disease and cerebral atrophy. Electronically Signed   By: Dasie Hamburg M.D.   On: 03/01/2024 13:51   DG Chest Portable 1 View Result Date: 02/26/2024 CLINICAL DATA:  Low oxygen EXAM: PORTABLE CHEST 1 VIEW COMPARISON:  Chest x-ray 12/12/2023 FINDINGS: Patient is slightly rotated. There is prominence of soft tissues in the right paratracheal region. This may be accentuated by patient rotation, but mediastinal process is not excluded. Heart size is normal. The lungs are clear. There is no pleural effusion or pneumothorax. No acute fractures are identified. IMPRESSION: Prominence of soft tissues in the right paratracheal region. This  may be accentuated by patient rotation, but mediastinal process is not excluded. Recommend further evaluation with PA and lateral chest x-ray. Electronically Signed   By: Greig Pique M.D.   On: 02/26/2024 18:33    Alm Schneider, DO  [facility]  If 7PM-7AM, please contact night-coverage [email or web address] Password [email or web address] 03/02/2024, 5:09 PM   LOS: 5 days

## 2024-03-02 NOTE — Progress Notes (Signed)
 While changing pt, she expressed a small dark mushy stool. Will continue to monitor pt for remainder of shift.

## 2024-03-02 NOTE — Plan of Care (Signed)
  Problem: Clinical Measurements: Goal: Will remain free from infection Outcome: Progressing   Problem: Activity: Goal: Risk for activity intolerance will decrease Outcome: Progressing   Problem: Coping: Goal: Level of anxiety will decrease Outcome: Progressing   Problem: Elimination: Goal: Will not experience complications related to bowel motility Outcome: Progressing Goal: Will not experience complications related to urinary retention Outcome: Progressing   Problem: Pain Managment: Goal: General experience of comfort will improve and/or be controlled Outcome: Progressing   Problem: Safety: Goal: Ability to remain free from injury will improve Outcome: Progressing   Problem: Skin Integrity: Goal: Risk for impaired skin integrity will decrease Outcome: Progressing

## 2024-03-02 NOTE — Progress Notes (Signed)
 1006: RN made Dr. Evonnie aware: patient is on regular diet. However is unable to swallow pills whole today RN will try crushing pills in applesauce. Made MD aware that RN can not crush metoprolol  ER and protonix  EC. MD changed metoprolol  to IR and RN held protonix . Patient tolerated crushed meds in applesauce.   Approx 1215: Speech assessed patient and modified diet, and advised to crush pills in applesauce.   1508: Made Dr Tat aware patients BP 91/37 (54), 112/49 (68), 104/47 (63) and HR 64-73, asymptomatic. No new orders.

## 2024-03-02 NOTE — Evaluation (Signed)
 Clinical/Bedside Swallow Evaluation Patient Details  Name: Donna Barron MRN: [id number] Date of Birth: 12/23/1940  Today's Date: 03/02/2024 Time: SLP Start Time (ACUTE ONLY): 1144 SLP Stop Time (ACUTE ONLY): 1211 SLP Time Calculation (min) (ACUTE ONLY): 27 min  Past Medical History:  Past Medical History:  Diagnosis Date   Anxiety    Cataract    Colon polyps    Dementia (HCC)    Depression    Dysrhythmia    heart palipations   Gallstones    GERD (gastroesophageal reflux disease)    HTN (hypertension)    Hypercholesteremia    PONV (postoperative nausea and vomiting)    Past Surgical History:  Past Surgical History:  Procedure Laterality Date   APPENDECTOMY     ARTHROSCOPIC REPAIR ACL  05/2008   Left knee   CARPAL TUNNEL RELEASE Right 07/14/2017   Procedure: CARPAL TUNNEL RELEASE;  Surgeon: Margrette Taft BRAVO, MD;  Location: [facility];  Service: Orthopedics;  Laterality: Right;   CATARACT EXTRACTION, BILATERAL  2020   CHOLECYSTECTOMY     DILATION AND CURETTAGE OF UTERUS     DORSAL COMPARTMENT RELEASE Right 07/14/2017   Procedure: RELEASE DORSAL COMPARTMENT (DEQUERVAIN);  Surgeon: Margrette Taft BRAVO, MD;  Location: [facility];  Service: Orthopedics;  Laterality: Right;   HIATAL HERNIA REPAIR N/A 03/24/2020   Procedure: LAPAROSCOPIC REPAIR OF HIATAL HERNIA, nissen fundoplication;  Surgeon: Gladis Cough, MD;  Location: [facility];  Service: General;  Laterality: N/A;   HYSTEROSCOPY WITH D & C N/A 12/25/2013   Procedure: DILATATION AND CURETTAGE /HYSTEROSCOPY;  Surgeon: Peggye Gull, MD;  Location: [facility];  Service: Gynecology;  Laterality: N/A;   SHOULDER OPEN ROTATOR CUFF REPAIR Left 06/15/2018   Procedure: ROTATOR CUFF REPAIR SHOULDER OPEN WITH ACROMIOPLASTY;  Surgeon: Margrette Taft BRAVO, MD;  Location: [facility];  Service: Orthopedics;  Laterality: Left;   TUBAL LIGATION     HPI:  83 y.o. female with medical history significant of depression, dementia, paroxysmal atrial fibrillation,  seizures, GERD, hypertension, and hyperlipidemia who presented with altered mental status.  Recent hospitalization 08/02 to 01/17/24 for urinary tract infection, complicated with acute metabolic encephalopathy. She was diagnosed with E Coli urine infection and was treated with ceftriaxone , placed on prophylactic nitrofurantoin  and estrogen vaginal cream for recurrent urinary tract infections and was discharged to SNF.  mild change in her mentation about 3 days ago, being less responsive and having poor appetite. The following day she had generalized weakness and was difficult to transfer out of the bed, her mentation continue to worsen. BSE requested    Assessment / Plan / Recommendation  Clinical Impression  Clinical swallowing evaluation completed while Pt was sitting upright in bed. RN reports poor oral awareness with breakfast, prolonged chewing and poor oral awareness with pills this morning. Suspect dysphagia is primarily cognitive based. SLP provided a banana and Pt demonstrated slightly prolonged mastication but overall good manipulation of the bolus followed by a what appears to be a timely swallow. Thin liquids consumed without incident. Pt did report not wanting anything hard and refused cracker trials. Recommend downgrade Pt's diet to D2/fine chop and continue with thin liquids. Recommend crush meds in puree. ST will follow for possible upgrade and assessment of diet tolerance. Thank you, SLP Visit Diagnosis: Dysphagia, unspecified (R13.10)    Aspiration Risk       Diet Recommendation Dysphagia 2 (Fine chop);Thin liquid    Liquid Administration via: Cup;Straw Medication Administration: Crushed with puree Supervision: Patient able to self feed;Staff to  assist with self feeding Compensations: Minimize environmental distractions;Slow rate;Small sips/bites Postural Changes: Seated upright at 90 degrees    Other  Recommendations Oral Care Recommendations: Oral care BID         Functional Status Assessment Patient has had a recent decline in their functional status and demonstrates the ability to make significant improvements in function in a reasonable and predictable amount of time.  Frequency and Duration min 1 x/week  1 week       Prognosis Prognosis for improved oropharyngeal function: Good      Swallow Study   General Date of Onset: 02/26/24 HPI: 83 y.o. female with medical history significant of depression, dementia, paroxysmal atrial fibrillation, seizures, GERD, hypertension, and hyperlipidemia who presented with altered mental status.  Recent hospitalization 08/02 to 01/17/24 for urinary tract infection, complicated with acute metabolic encephalopathy. She was diagnosed with E Coli urine infection and was treated with ceftriaxone , placed on prophylactic nitrofurantoin  and estrogen vaginal cream for recurrent urinary tract infections and was discharged to SNF.  mild change in her mentation about 3 days ago, being less responsive and having poor appetite. The following day she had generalized weakness and was difficult to transfer out of the bed, her mentation continue to worsen. BSE requested Type of Study: Bedside Swallow Evaluation Previous Swallow Assessment: none in chart Diet Prior to this Study: Regular;Thin liquids (Level 0) Temperature Spikes Noted: No Respiratory Status: Room air History of Recent Intubation: No Behavior/Cognition: Alert;Cooperative;Pleasant mood Oral Cavity Assessment: Within Functional Limits Oral Care Completed by SLP: Recent completion by staff Oral Cavity - Dentition: Adequate natural dentition Vision: Functional for self-feeding Self-Feeding Abilities: Needs assist;Needs set up Patient Positioning: Upright in bed Baseline Vocal Quality: Normal Volitional Cough: Strong Volitional Swallow: Able to elicit    Oral/Motor/Sensory Function Overall Oral Motor/Sensory Function: Within functional limits   Ice Chips Ice chips:  Within functional limits   Thin Liquid Thin Liquid: Within functional limits    Nectar Thick Nectar Thick Liquid: Not tested   Honey Thick Honey Thick Liquid: Not tested   Puree Puree: Within functional limits   Solid     Solid: Impaired Oral Phase Impairments: Reduced lingual movement/coordination Oral Phase Functional Implications: Prolonged oral transit     Keniya Schlotterbeck H. Clois KILLIAN, CCC-SLP Speech Language Pathologist   Raguel VEAR Clois 03/02/2024,12:18 PM

## 2024-03-02 NOTE — Plan of Care (Signed)

## 2024-03-03 DIAGNOSIS — G9341 Metabolic encephalopathy: Secondary | ICD-10-CM | POA: Diagnosis not present

## 2024-03-03 DIAGNOSIS — Z7189 Other specified counseling: Secondary | ICD-10-CM

## 2024-03-03 DIAGNOSIS — I480 Paroxysmal atrial fibrillation: Secondary | ICD-10-CM | POA: Diagnosis not present

## 2024-03-03 DIAGNOSIS — A4150 Gram-negative sepsis, unspecified: Secondary | ICD-10-CM | POA: Diagnosis not present

## 2024-03-03 DIAGNOSIS — N390 Urinary tract infection, site not specified: Secondary | ICD-10-CM | POA: Diagnosis not present

## 2024-03-03 LAB — AMMONIA: Ammonia: 20 umol/L (ref 9–35)

## 2024-03-03 LAB — BASIC METABOLIC PANEL WITH GFR
Anion gap: 9 (ref 5–15)
BUN: 9 mg/dL (ref 8–23)
CO2: 29 mmol/L (ref 22–32)
Calcium: 8.7 mg/dL — ABNORMAL LOW (ref 8.9–10.3)
Chloride: 100 mmol/L (ref 98–111)
Creatinine, Ser: 0.55 mg/dL (ref 0.44–1.00)
GFR, Estimated: >60 mL/min (ref >60)
Glucose, Bld: 83 mg/dL (ref 70–99)
Potassium: 3.8 mmol/L (ref 3.5–5.1)
Sodium: 138 mmol/L (ref 135–145)

## 2024-03-03 LAB — MAGNESIUM: Magnesium: 1.8 mg/dL (ref 1.7–2.4)

## 2024-03-03 MED ORDER — DIVALPROEX SODIUM 125 MG PO CSDR
250.0000 mg | DELAYED_RELEASE_CAPSULE | Freq: Every day | ORAL | Status: DC
  Administered 2024-03-03 – 2024-03-04 (×2): 250 mg via ORAL
  Filled 2024-03-03 (×2): qty 2

## 2024-03-03 MED ORDER — DIVALPROEX SODIUM 125 MG PO CSDR
125.0000 mg | DELAYED_RELEASE_CAPSULE | Freq: Every morning | ORAL | Status: DC
  Administered 2024-03-04 – 2024-03-05 (×2): 125 mg via ORAL
  Filled 2024-03-03 (×2): qty 1

## 2024-03-03 NOTE — Plan of Care (Signed)

## 2024-03-03 NOTE — Progress Notes (Signed)
 PROGRESS NOTE  Donna Barron FMW:[id number] DOB: [birth year] DOA: 02/26/2024 PCP: [name], NP  Brief History:  83 y.o. female with medical history significant of depression, dementia, paroxysmal atrial fibrillation, seizures, GERD, hypertension, and hyperlipidemia who presented with altered mental status.  Recent hospitalization 08/02 to 01/17/24 for urinary tract infection, complicated with acute metabolic encephalopathy. She was diagnosed with E Coli urine infection and was treated with ceftriaxone , placed on prophylactic nitrofurantoin  and estrogen vaginal cream for recurrent urinary tract infections and was discharged to SNF.  Eventually patient was discharged home and was recovering well with home health services.   09/08 had outpatient follow up with primary care with no major changes to her medications.    This time she was noted by her daughter to have a mild change in her mentation about 3 days ago, being less responsive and having poor appetite. The following day she had generalized weakness and was difficult to transfer out of the bed, her mentation continue to worsen. Her urine had a strong odor. Today she was completely dependent to be moved. Because rapid progressive of her symptoms her daughter called EMS and she was brought her to the ED for further evaluation.    At the time of my examination she is able to respond simple questions, most of the information is from her daughter at the bedside.  At home uses walker for ambulation.    Assessment/Plan: Severe Sepsis/Pseudomonas UTI  -present on admission -Severe sepsis with tachypnea, hypotension and acute metabolic encephalopathy, (present on admission)  -01/2024 positive for E coli sensitive to ceftriaxone , but resistant to ampicillin and ciprofloxacin  -initially on ceftriaxone  -IV cefepime  ordered 9/16>>9/18 -9/18 start po cipro  -sepsis physiology resolved   Gram positive Cocci Bacteremia -- represents  contaminant>>MRSE 1/2 sets   Hyperammonemia -likely due to patient's depakote  -discussed with pt's neurologist, Dr. Darice Shivers -start lactulose , repeat ammonia in am - increase trileptal  to 300 mg bid - decrease depakote  to 125 mg AM, 250 mg PM  Seizure disorder  -continue depakote  and trileptal    Paroxysmal atrial fibrillation  -continue metoprolol  and apixaban    Major neurocognitive disorder - Continue Depakote  -continue lexapro    Acute metabolic encephalopathy -- Improving  -Patient overall improving but remains somewhat somnolent -due to infection, partly from hyperammonemia -9/18--daughter concerned pt is less interactive  -check ammonia (pt on depakote ) -check depakote  level -B12--469 -TSH--1.228 - folate 17.7 -d/c cefepime >>start cipro    Essential hypertension -Hold  furosemide  and continue with metoprolol .    Obesity, class 1 -BMI is 33.11    Mixed hyperlipidemia -Continue statin    Goals of Care -I spoke with pt's neurologist, Dr. Darice Shivers who updated me regarding patient -03/02/24--spoke with daughter--daughter endorses overall functional and cognitive decline since 04/2023 - We discussed the patient's current condition and her metabolic encephalopathy.  We discussed the patient's frequent hospitalizations and her high risk for repeated hospitalizations in the future.  We discussed that with each progressive hospitalization the patient will continue to experience functional decline and with her decreased neurologic reserve, she may continue to remain encephalopathic -03/03/24--further GOC with both daughters>>agree to DNR; plan to transition to SNF for now in hopes pt will improve -We discussed what her mother would want if there was no improvement in her current functional and mental capacities and quality of life issues -Advance care planning, including the explanation and discussion of advance directives was carried out with the patient and family.  Code  status including explanations of Full Code and DNR and alternatives were discussed in detail.  Discussion of end-of-life issues including but not limited palliative care, hospice care and the concept of hospice, other end-of-life care options, power of attorney for health care decisions, living wills, and physician orders for life-sustaining treatment were also discussed with the patient and family.  Total face to face time 26 minutes.       Family Communication:   daughters at bedside 9/20   Consultants:  none   Code Status:  FULL    DVT Prophylaxis:  apixaban      Procedures: As Listed in Progress Note Above   Antibiotics: Cefepime  9/16>>9/18 Cipro  9/18>>       Subjective: Patient denies fevers, chills, headache, chest pain, dyspnea, nausea, vomiting, diarrhea, abdominal pain, dysuria   Objective: Vitals:   03/02/24 2103 03/03/24 0544 03/03/24 0932 03/03/24 1438  BP: (!) 127/52 (!) 102/54 (!) 112/59 90/60  Pulse: 73 63 66 68  Resp: 16 15  17   Temp: 98 F (36.7 C) (!) 97.5 F (36.4 C) 98.3 F (36.8 C) 98.2 F (36.8 C)  TempSrc: Oral Oral Oral Oral  SpO2: 94% 94% 92% 94%  Weight:      Height:        Intake/Output Summary (Last 24 hours) at 03/03/2024 1643 Last data filed at 03/03/2024 0900 Gross per 24 hour  Intake 720 ml  Output 1000 ml  Net -280 ml   Weight change:  Exam:  General:  Pt is alert, follows commands appropriately, not in acute distress HEENT: No icterus, No thrush, No neck mass, [place]/AT Cardiovascular: RRR, S1/S2, no rubs, no gallops Respiratory: bibasilar rales.  No wheeze Abdomen: Soft/+BS, non tender, non distended, no guarding Extremities: Nonpitting edema, No lymphangitis, No petechiae, No rashes, no synovitis   Data Reviewed: I have personally reviewed following labs and imaging studies Basic Metabolic Panel: Recent Labs  Lab 02/26/24 1744 02/27/24 0458 03/01/24 1151 03/02/24 0321 03/03/24 0359  NA 129* 139 139 139 138  K  3.6 3.5 3.4* 3.8 3.8  CL 94* 102 103 104 100  CO2 25 26 24 27 29   GLUCOSE 95 94 137* 91 83  BUN 10 9 9 9 9   CREATININE 0.70 0.62 0.50 0.50 0.55  CALCIUM 8.5* 8.8* 8.6* 8.9 8.7*  MG  --   --  1.8 1.9 1.8   Liver Function Tests: Recent Labs  Lab 02/26/24 1744 03/01/24 1151  AST 17 14*  ALT 9 7  ALKPHOS 83 68  BILITOT 0.5 0.5  PROT 6.9 5.6*  ALBUMIN 3.3* 2.5*   No results for input(s): LIPASE, AMYLASE in the last 168 hours. Recent Labs  Lab 03/01/24 1151 03/02/24 0321 03/03/24 0359  AMMONIA 41* 53* 20   Coagulation Profile: Recent Labs  Lab 02/26/24 1852  INR 1.2   CBC: Recent Labs  Lab 02/26/24 1744 02/27/24 0458  WBC 7.8 7.8  NEUTROABS 5.2  --   HGB 15.7* 13.3  HCT 47.3* 40.5  MCV 92.9 95.7  PLT 285 260   Cardiac Enzymes: No results for input(s): CKTOTAL, CKMB, CKMBINDEX, TROPONINI in the last 168 hours. BNP: Invalid input(s): POCBNP CBG: No results for input(s): GLUCAP in the last 168 hours. HbA1C: No results for input(s): HGBA1C in the last 72 hours. Urine analysis:    Component Value Date/Time   COLORURINE YELLOW 02/26/2024 1716   APPEARANCEUR CLOUDY (A) 02/26/2024 1716   LABSPEC 1.016 02/26/2024 1716   PHURINE 7.0 02/26/2024 1716  GLUCOSEU NEGATIVE 02/26/2024 1716   HGBUR SMALL (A) 02/26/2024 1716   BILIRUBINUR NEGATIVE 02/26/2024 1716   KETONESUR 5 (A) 02/26/2024 1716   PROTEINUR 30 (A) 02/26/2024 1716   NITRITE NEGATIVE 02/26/2024 1716   LEUKOCYTESUR MODERATE (A) 02/26/2024 1716   Sepsis Labs: @LABRCNTIP (procalcitonin:4,lacticidven:4) ) Recent Results (from the past 240 hours)  Urine Culture     Status: Abnormal   Collection Time: 02/26/24  5:16 PM   Specimen: Urine, Clean Catch  Result Value Ref Range Status   Specimen Description   Final    URINE, CLEAN CATCH Performed at [facility], [address], [place], [place] [address]    Special Requests   Final    NONE Performed at [facility], [address] [place], [place] [address]    Culture 30,000 COLONIES/mL PSEUDOMONAS AERUGINOSA (A)  Final   Report Status 02/29/2024 FINAL  Final   Organism ID, Bacteria PSEUDOMONAS AERUGINOSA (A)  Final      Susceptibility   Pseudomonas aeruginosa - MIC*    MEROPENEM 0.5 SENSITIVE Sensitive     CIPROFLOXACIN  <=0.06 SENSITIVE Sensitive     IMIPENEM <=0.5 SENSITIVE Sensitive     PIP/TAZO Value in next row Sensitive ug/mL     8 SENSITIVEThis is a modified FDA-approved test that has been validated and its performance characteristics determined by the reporting laboratory.  This laboratory is certified under the Clinical Laboratory Improvement Amendments CLIA as qualified to perform high complexity clinical laboratory testing.    CEFEPIME  Value in next row Sensitive      8 SENSITIVEThis is a modified FDA-approved test that has been validated and its performance characteristics determined by the reporting laboratory.  This laboratory is certified under the Clinical Laboratory Improvement Amendments CLIA as qualified to perform high complexity clinical laboratory testing.    CEFTAZIDIME/AVIBACTAM Value in next row Sensitive ug/mL     8 SENSITIVEThis is a modified FDA-approved test that has been validated and its performance characteristics determined by the reporting laboratory.  This laboratory is certified under the Clinical Laboratory Improvement Amendments CLIA as qualified to perform high complexity clinical laboratory testing.    CEFTOLOZANE/TAZOBACTAM Value in next row Sensitive ug/mL     8 SENSITIVEThis is a modified FDA-approved test that has been validated and its performance characteristics determined by the reporting laboratory.  This laboratory is certified under the Clinical Laboratory Improvement Amendments CLIA as qualified to perform high complexity clinical laboratory testing.    * 30,000 COLONIES/mL PSEUDOMONAS AERUGINOSA  Blood Culture (routine x 2)     Status: None   Collection Time: 02/26/24   6:19 PM   Specimen: BLOOD LEFT FOREARM  Result Value Ref Range Status   Specimen Description BLOOD LEFT FOREARM  Final   Special Requests NONE  Final   Culture   Final    NO GROWTH 5 DAYS Performed at [facility], [address] [place], [place] [address]    Report Status 03/02/2024 FINAL  Final  Resp panel by RT-PCR (RSV, Flu A&B, Covid) Anterior Nasal Swab     Status: None   Collection Time: 02/26/24  6:20 PM   Specimen: Anterior Nasal Swab  Result Value Ref Range Status   SARS Coronavirus 2 by RT PCR NEGATIVE NEGATIVE Final    Comment: (NOTE) SARS-CoV-2 target nucleic acids are NOT DETECTED.  The SARS-CoV-2 RNA is generally detectable in upper respiratory specimens during the acute phase of infection. The lowest concentration of SARS-CoV-2 viral copies this assay can detect is 138  copies/mL. A negative result does not preclude SARS-Cov-2 infection and should not be used as the sole basis for treatment or other patient management decisions. A negative result may occur with  improper specimen collection/handling, submission of specimen other than nasopharyngeal swab, presence of viral mutation(s) within the areas targeted by this assay, and inadequate number of viral copies(<138 copies/mL). A negative result must be combined with clinical observations, patient history, and epidemiological information. The expected result is Negative.  Fact Sheet for Patients:  BloggerCourse.com  Fact Sheet for Healthcare Providers:  SeriousBroker.it  This test is no t yet approved or cleared by the [place]  FDA and  has been authorized for detection and/or diagnosis of SARS-CoV-2 by FDA under an Emergency Use Authorization (EUA). This EUA will remain  in effect (meaning this test can be used) for the duration of the COVID-19 declaration under Section 564(b)(1) of the Act, 21 U.S.C.section 360bbb-3(b)(1), unless the authorization is  terminated  or revoked sooner.       Influenza A by PCR NEGATIVE NEGATIVE Final   Influenza B by PCR NEGATIVE NEGATIVE Final    Comment: (NOTE) The Xpert Xpress SARS-CoV-2/FLU/RSV plus assay is intended as an aid in the diagnosis of influenza from Nasopharyngeal swab specimens and should not be used as a sole basis for treatment. Nasal washings and aspirates are unacceptable for Xpert Xpress SARS-CoV-2/FLU/RSV testing.  Fact Sheet for Patients: BloggerCourse.com  Fact Sheet for Healthcare Providers: SeriousBroker.it  This test is not yet approved or cleared by the [place]  FDA and has been authorized for detection and/or diagnosis of SARS-CoV-2 by FDA under an Emergency Use Authorization (EUA). This EUA will remain in effect (meaning this test can be used) for the duration of the COVID-19 declaration under Section 564(b)(1) of the Act, 21 U.S.C. section 360bbb-3(b)(1), unless the authorization is terminated or revoked.     Resp Syncytial Virus by PCR NEGATIVE NEGATIVE Final    Comment: (NOTE) Fact Sheet for Patients: BloggerCourse.com  Fact Sheet for Healthcare Providers: SeriousBroker.it  This test is not yet approved or cleared by the [place]  FDA and has been authorized for detection and/or diagnosis of SARS-CoV-2 by FDA under an Emergency Use Authorization (EUA). This EUA will remain in effect (meaning this test can be used) for the duration of the COVID-19 declaration under Section 564(b)(1) of the Act, 21 U.S.C. section 360bbb-3(b)(1), unless the authorization is terminated or revoked.  Performed at [facility], [address], [place], [place] [address]   Blood Culture (routine x 2)     Status: Abnormal   Collection Time: 02/26/24  6:27 PM   Specimen: Left Antecubital; Blood  Result Value Ref Range Status   Specimen Description   Final    LEFT  ANTECUBITAL Performed at [facility], [address] [place], [place] [address]    Special Requests   Final    NONE Performed at [facility], [address], [place], [place] [address]    Culture  Setup Time   Final    GRAM POSITIVE COCCI ANAEROBIC BOTTLE ONLY Gram Stain Report Called to,Read Back By and Verified With: JOHN M. ON 02/27/2024 @4 :18PM BY T. HAMER  CRITICAL RESULT CALLED TO, READ BACK BY AND VERIFIED WITH: RN ASHLEY H 0110 [id number] FCP    Culture (A)  Final    STAPHYLOCOCCUS EPIDERMIDIS THE SIGNIFICANCE OF ISOLATING THIS ORGANISM FROM A SINGLE SET OF BLOOD CULTURES WHEN MULTIPLE SETS ARE DRAWN IS UNCERTAIN. PLEASE NOTIFY THE MICROBIOLOGY DEPARTMENT WITHIN ONE WEEK  IF SPECIATION AND SENSITIVITIES ARE REQUIRED. Performed at [facility], [address], [place], [place] [address]    Report Status 02/29/2024 FINAL  Final  Blood Culture ID Panel (Reflexed)     Status: Abnormal   Collection Time: 02/26/24  6:27 PM  Result Value Ref Range Status   Enterococcus faecalis NOT DETECTED NOT DETECTED Final   Enterococcus Faecium NOT DETECTED NOT DETECTED Final   Listeria monocytogenes NOT DETECTED NOT DETECTED Final   Staphylococcus species DETECTED (A) NOT DETECTED Final    Comment: CRITICAL RESULT CALLED TO, READ BACK BY AND VERIFIED WITH: RN ASHLEY H 0110 [id number] FCP    Staphylococcus aureus (BCID) NOT DETECTED NOT DETECTED Final   Staphylococcus epidermidis DETECTED (A) NOT DETECTED Final    Comment: Methicillin (oxacillin) resistant coagulase negative staphylococcus. Possible blood culture contaminant (unless isolated from more than one blood culture draw or clinical case suggests pathogenicity). No antibiotic treatment is indicated for blood  culture contaminants. CRITICAL RESULT CALLED TO, READ BACK BY AND VERIFIED WITH: RN ASHLEY H 0110 [id number] FCP    Staphylococcus lugdunensis NOT DETECTED NOT DETECTED Final   Streptococcus species NOT DETECTED NOT DETECTED Final    Streptococcus agalactiae NOT DETECTED NOT DETECTED Final   Streptococcus pneumoniae NOT DETECTED NOT DETECTED Final   Streptococcus pyogenes NOT DETECTED NOT DETECTED Final   A.calcoaceticus-baumannii NOT DETECTED NOT DETECTED Final   Bacteroides fragilis NOT DETECTED NOT DETECTED Final   Enterobacterales NOT DETECTED NOT DETECTED Final   Enterobacter cloacae complex NOT DETECTED NOT DETECTED Final   Escherichia coli NOT DETECTED NOT DETECTED Final   Klebsiella aerogenes NOT DETECTED NOT DETECTED Final   Klebsiella oxytoca NOT DETECTED NOT DETECTED Final   Klebsiella pneumoniae NOT DETECTED NOT DETECTED Final   Proteus species NOT DETECTED NOT DETECTED Final   Salmonella species NOT DETECTED NOT DETECTED Final   Serratia marcescens NOT DETECTED NOT DETECTED Final   Haemophilus influenzae NOT DETECTED NOT DETECTED Final   Neisseria meningitidis NOT DETECTED NOT DETECTED Final   Pseudomonas aeruginosa NOT DETECTED NOT DETECTED Final   Stenotrophomonas maltophilia NOT DETECTED NOT DETECTED Final   Candida albicans NOT DETECTED NOT DETECTED Final   Candida auris NOT DETECTED NOT DETECTED Final   Candida glabrata NOT DETECTED NOT DETECTED Final   Candida krusei NOT DETECTED NOT DETECTED Final   Candida parapsilosis NOT DETECTED NOT DETECTED Final   Candida tropicalis NOT DETECTED NOT DETECTED Final   Cryptococcus neoformans/gattii NOT DETECTED NOT DETECTED Final   Methicillin resistance mecA/C DETECTED (A) NOT DETECTED Final    Comment: CRITICAL RESULT CALLED TO, READ BACK BY AND VERIFIED WITH: RN ROSINA VEAR SPIKE [id number] FCP Performed at [facility], [address] [place], [place] [address]      Scheduled Meds:  apixaban   5 mg Oral BID   ascorbic acid   500 mg Oral Daily   ciprofloxacin   500 mg Oral BID   [START ON 03/04/2024] divalproex   125 mg Oral q morning   divalproex   250 mg Oral QHS   escitalopram   20 mg Oral QHS   feeding supplement  237 mL Oral BID BM   lactulose   20 g  Oral BID   melatonin  3 mg Oral QHS   metoprolol  tartrate  25 mg Oral BID   OXcarbazepine   300 mg Oral BID   pantoprazole   40 mg Oral Daily   polyethylene glycol  17 g Oral Daily   senna  2 tablet Oral Daily  simvastatin   40 mg Oral QHS   Continuous Infusions:  Procedures/Studies: CT HEAD WO CONTRAST ( ) Result Date: 03/01/2024 CLINICAL DATA:  Mental status change, unknown cause. EXAM: CT HEAD WITHOUT CONTRAST TECHNIQUE: Contiguous axial images were obtained from the base of the skull through the vertex without intravenous contrast. RADIATION DOSE REDUCTION: This exam was performed according to the departmental dose-optimization program which includes automated exposure control, adjustment of the mA and/or kV according to patient size and/or use of iterative reconstruction technique. COMPARISON:  Head CT 01/14/2024 and MRI 01/15/2024 FINDINGS: Brain: There is no evidence of an acute infarct, intracranial hemorrhage, mass, midline shift, or extra-axial fluid collection. There is moderate cerebral atrophy. Patchy cerebral white matter hypodensities are similar to the are CT and are nonspecific but compatible with moderate chronic small vessel ischemic disease. Vascular: Atherosclerotic calcification.  No hyperdense vessel. Skull: No fracture or suspicious lesion. Sinuses/Orbits: Increased left sphenoid sinus fluid. Clear mastoid air cells. Bilateral cataract extraction. Other: None. IMPRESSION: 1. No evidence of acute intracranial abnormality. 2. Moderate chronic small vessel ischemic disease and cerebral atrophy. Electronically Signed   By: Dasie Hamburg M.D.   On: 03/01/2024 13:51   DG Chest Portable 1 View Result Date: 02/26/2024 CLINICAL DATA:  Low oxygen EXAM: PORTABLE CHEST 1 VIEW COMPARISON:  Chest x-ray 12/12/2023 FINDINGS: Patient is slightly rotated. There is prominence of soft tissues in the right paratracheal region. This may be accentuated by patient rotation, but mediastinal process is  not excluded. Heart size is normal. The lungs are clear. There is no pleural effusion or pneumothorax. No acute fractures are identified. IMPRESSION: Prominence of soft tissues in the right paratracheal region. This may be accentuated by patient rotation, but mediastinal process is not excluded. Recommend further evaluation with PA and lateral chest x-ray. Electronically Signed   By: Greig Pique M.D.   On: 02/26/2024 18:33    Alm Schneider, DO  [facility]  If 7PM-7AM, please contact night-coverage [email or web address] Password [facility] 03/03/2024, 4:43 PM   LOS: 6 days

## 2024-03-04 DIAGNOSIS — G40909 Epilepsy, unspecified, not intractable, without status epilepticus: Secondary | ICD-10-CM | POA: Diagnosis not present

## 2024-03-04 DIAGNOSIS — A4150 Gram-negative sepsis, unspecified: Secondary | ICD-10-CM | POA: Diagnosis not present

## 2024-03-04 DIAGNOSIS — N390 Urinary tract infection, site not specified: Secondary | ICD-10-CM | POA: Diagnosis not present

## 2024-03-04 DIAGNOSIS — E66811 Obesity, class 1: Secondary | ICD-10-CM | POA: Diagnosis not present

## 2024-03-04 LAB — BASIC METABOLIC PANEL WITH GFR
Anion gap: 12 (ref 5–15)
BUN: 11 mg/dL (ref 8–23)
CO2: 30 mmol/L (ref 22–32)
Calcium: 8.9 mg/dL (ref 8.9–10.3)
Chloride: 96 mmol/L — ABNORMAL LOW (ref 98–111)
Creatinine, Ser: 0.64 mg/dL (ref 0.44–1.00)
GFR, Estimated: >60 mL/min (ref >60)
Glucose, Bld: 94 mg/dL (ref 70–99)
Potassium: 4.1 mmol/L (ref 3.5–5.1)
Sodium: 138 mmol/L (ref 135–145)

## 2024-03-04 LAB — MAGNESIUM: Magnesium: 1.8 mg/dL (ref 1.7–2.4)

## 2024-03-04 MED ORDER — DIVALPROEX SODIUM 125 MG PO CSDR: 250.0000 mg | DELAYED_RELEASE_CAPSULE | Freq: Every day | ORAL | Status: DC

## 2024-03-04 MED ORDER — DIVALPROEX SODIUM 125 MG PO CSDR: 125.0000 mg | DELAYED_RELEASE_CAPSULE | Freq: Every morning | ORAL | Status: DC

## 2024-03-04 MED ORDER — ASCORBIC ACID 500 MG PO TABS: 500.0000 mg | ORAL_TABLET | Freq: Every day | ORAL | Status: AC

## 2024-03-04 MED ORDER — METOPROLOL TARTRATE 25 MG PO TABS: 25.0000 mg | ORAL_TABLET | Freq: Two times a day (BID) | ORAL | Status: DC

## 2024-03-04 MED ORDER — OXCARBAZEPINE 300 MG PO TABS: 300.0000 mg | ORAL_TABLET | Freq: Two times a day (BID) | ORAL | Status: DC

## 2024-03-04 NOTE — Discharge Summary (Signed)
 Physician Discharge Summary   Patient: Donna Barron MRN: [id number] DOB: 29-Nov-1940  Admit date:     02/26/2024  Discharge date: 03/05/2024  Discharge Physician: Alm Maryana Pittmon   PCP: [name], NP   Recommendations at discharge:   Please follow up with primary care provider within 1-2 weeks  Please repeat BMP one week  Please follow up on/with Dr. Darice Shivers in 2-4 weeks     Hospital Course: 83 y.o. female with medical history significant of depression, dementia, paroxysmal atrial fibrillation, seizures, GERD, hypertension, and hyperlipidemia who presented with altered mental status.  Recent hospitalization 08/02 to 01/17/24 for urinary tract infection, complicated with acute metabolic encephalopathy. She was diagnosed with E Coli urine infection and was treated with ceftriaxone , placed on prophylactic nitrofurantoin  and estrogen vaginal cream for recurrent urinary tract infections and was discharged to SNF.  Eventually patient was discharged home and was recovering well with home health services.   09/08 had outpatient follow up with primary care with no major changes to her medications.    This time she was noted by her daughter to have a mild change in her mentation about 3 days ago, being less responsive and having poor appetite. The following day she had generalized weakness and was difficult to transfer out of the bed, her mentation continue to worsen. Her urine had a strong odor. Today she was completely dependent to be moved. Because rapid progressive of her symptoms her daughter called EMS and she was brought her to the ED for further evaluation.    At the time of my examination she is able to respond simple questions, most of the information is from her daughter at the bedside.  At home uses walker for ambulation.  As the patient was treated with antibiotics, her mental status improved.  She did have waxing and waning episodes of mental status with some periods of less  responsiveness.  There were no overt episodes of seizure or staring spells.  The patient did have elevated ammonia which was felt to be due to her Depakote .  The case was discussed with the patient's outpatient neurologist, Dr. Darice Shivers.  The patient was treated with a few doses of lactulose  with improvement of her ammonia.  Ultimately, her mental status stabilized and was at baseline.  Decision was made to decrease the patient's Depakote  slightly.  Her dose of Trileptal  was increased.  The patient finished 7 days of antibiotics for her UTI during the hospitalization.  Assessment and Plan: Severe Sepsis/Pseudomonas UTI  -present on admission -Severe sepsis with tachypnea, hypotension and acute metabolic encephalopathy, (present on admission)  -01/2024 positive for E coli sensitive to ceftriaxone , but resistant to ampicillin and ciprofloxacin  -initially on ceftriaxone  -IV cefepime  ordered 9/16>>9/18 -9/18 start po cipro  -sepsis physiology resolved -finished 7 days of antibiotics on 03/05/24   Gram positive Cocci Bacteremia -- represents contaminant>>MRSE 1/2 sets   Hyperammonemia -likely due to patient's depakote  -discussed with pt's neurologist, Dr. Darice Shivers -given lactulose  with improved ammonia - increased trileptal  to 300 mg bid - decrease depakote  to 125 mg AM, 250 mg PM - repeat ammonia with decreased depakote    Seizure disorder  -continue depakote  and trileptal    Paroxysmal atrial fibrillation  -continue metoprolol  and apixaban    Major neurocognitive disorder - Continue Depakote  -continue lexapro    Acute metabolic encephalopathy -- Improving  -Patient overall improving  -due to infection, partly from hyperammonemia -9/18--daughter concerned pt is less interactive  -check ammonia (pt on depakote ) -check depakote  level -  B12--469 -TSH--1.228 - folate 17.7 -d/c cefepime >>started cipro  -finished 7 days of abx during the hospitalization   Essential  hypertension -Hold  furosemide  and continue with metoprolol .  -BP well controlled   Obesity, class 1 -BMI is 33.11    Mixed hyperlipidemia -Continue statin    Goals of Care -I spoke with pt's neurologist, Dr. Darice Shivers who updated me regarding patient -03/02/24--spoke with daughter--daughter endorses overall functional and cognitive decline since 04/2023 - We discussed the patient's current condition and her metabolic encephalopathy.  We discussed the patient's frequent hospitalizations and her high risk for repeated hospitalizations in the future.  We discussed that with each progressive hospitalization the patient will continue to experience functional decline and with her decreased neurologic reserve, she may continue to remain encephalopathic -03/03/24--further GOC with both daughters>>agree to DNR; plan to transition to SNF for now in hopes pt will improve -We discussed what her mother would want if there was no improvement in her current functional and mental capacities and quality of life issues -Advance care planning, including the explanation and discussion of advance directives was carried out with the patient and family.  Code status including explanations of Full Code and DNR and alternatives were discussed in detail.  Discussion of end-of-life issues including but not limited palliative care, hospice care and the concept of hospice, other end-of-life care options, power of attorney for health care decisions, living wills, and physician orders for life-sustaining treatment were also discussed with the patient and family.        Consultants: neurology on phone Procedures performed: none  Disposition: Home Diet recommendation:  Dys 2 with thin DISCHARGE MEDICATION: Allergies as of 03/05/2024       Reactions   Codeine  Swelling   Swelling of lips   Hydrocodone  Swelling   Lip swelling   Avelox [moxifloxacin Hcl In Nacl] Other (See Comments)   Insomnia    Oxybutynin  Palpitations, Other (See Comments)   Tongue felt coated         Medication List     STOP taking these medications    furosemide  40 MG tablet Commonly known as: LASIX    metoprolol  succinate 50 MG 24 hr tablet Commonly known as: TOPROL -XL       TAKE these medications    apixaban  5 MG Tabs tablet Commonly known as: Eliquis  Take 1 tablet (5 mg total) by mouth 2 (two) times daily.   ascorbic acid  500 MG tablet Commonly known as: VITAMIN C  Take 1 tablet (500 mg total) by mouth daily.   conjugated estrogens  0.625 MG/GM vaginal cream Commonly known as: PREMARIN  Place 0.25 Applicatorfuls vaginally daily.   divalproex  125 MG capsule Commonly known as: DEPAKOTE  SPRINKLE Take 2 capsules (250 mg total) by mouth 2 (two) times daily. What changed: Another medication with the same name was added. Make sure you understand how and when to take each.   divalproex  125 MG capsule Commonly known as: DEPAKOTE  SPRINKLE Take 2 capsules (250 mg total) by mouth at bedtime. What changed: You were already taking a medication with the same name, and this prescription was added. Make sure you understand how and when to take each.   divalproex  125 MG capsule Commonly known as: DEPAKOTE  SPRINKLE Take 1 capsule (125 mg total) by mouth every morning. What changed: You were already taking a medication with the same name, and this prescription was added. Make sure you understand how and when to take each.   Dulcolax 5 MG EC tablet Generic drug: bisacodyl  Take  1 tablet (5 mg total) by mouth daily as needed for moderate constipation.   escitalopram  20 MG tablet Commonly known as: LEXAPRO  Take 1 tablet (20 mg total) by mouth at bedtime.   folic acid  1 MG tablet Commonly known as: FOLVITE  Take 1 mg by mouth daily.   melatonin 5 MG Tabs Take 5 mg by mouth at bedtime.   metoprolol  tartrate 25 MG tablet Commonly known as: LOPRESSOR  Take 1 tablet (25 mg total) by mouth 2 (two) times daily.    nitrofurantoin  (macrocrystal-monohydrate) 100 MG capsule Commonly known as: Macrobid  Take 1 capsule (100 mg total) by mouth at bedtime.   nystatin  cream Commonly known as: MYCOSTATIN  Apply 1 Application topically 2 (two) times daily.   nystatin  powder Commonly known as: nystatin  Apply 1 Application topically 2 (two) times daily as needed.   omeprazole  40 MG capsule Commonly known as: PRILOSEC Take 1 capsule (40 mg total) by mouth daily. What changed: when to take this   Oxcarbazepine  300 MG tablet Commonly known as: TRILEPTAL  Take 1 tablet (300 mg total) by mouth 2 (two) times daily. What changed:  medication strength how much to take   potassium chloride  SA 20 MEQ tablet Commonly known as: KLOR-CON  M Take 1 tablet (20 mEq total) by mouth daily. What changed: when to take this   simvastatin  40 MG tablet Commonly known as: ZOCOR  Take 1 tablet (40 mg total) by mouth at bedtime.   vitamin B-12 500 MCG tablet Commonly known as: CYANOCOBALAMIN  Take 500 mcg by mouth daily.   Vitamin D  (Ergocalciferol ) 1.25 MG (50000 UNIT) Caps capsule Commonly known as: DRISDOL  Take 1 capsule (50,000 Units total) by mouth once a week. What changed: when to take this        Contact information for follow-up providers     [facility], [facility] Follow up.   Specialty: Home Health Services Why: Home health will call to schedule your next home visit. Contact information: [address] [place] [address] [phone]              Contact information for after-discharge care     Destination     [facility] .   Service: Skilled Nursing Contact information: [address] [place]  [address] [phone]                    Discharge Exam: Filed Weights   02/26/24 1723  Weight: 82.1 kg   HEENT:  [place]/AT, No thrush, no icterus CV:  RRR, no rub, no S3, no S4 Lung:  bibasilar rales. No wheeze Abd:  soft/+BS, NT Ext:   Nonpitting edema, no lymphangitis, no synovitis, no rash   Condition at discharge: stable  The results of significant diagnostics from this hospitalization (including imaging, microbiology, ancillary and laboratory) are listed below for reference.   Imaging Studies: CT HEAD WO CONTRAST ( ) Result Date: 03/01/2024 CLINICAL DATA:  Mental status change, unknown cause. EXAM: CT HEAD WITHOUT CONTRAST TECHNIQUE: Contiguous axial images were obtained from the base of the skull through the vertex without intravenous contrast. RADIATION DOSE REDUCTION: This exam was performed according to the departmental dose-optimization program which includes automated exposure control, adjustment of the mA and/or kV according to patient size and/or use of iterative reconstruction technique. COMPARISON:  Head CT 01/14/2024 and MRI 01/15/2024 FINDINGS: Brain: There is no evidence of an acute infarct, intracranial hemorrhage, mass, midline shift, or extra-axial fluid collection. There is moderate cerebral atrophy. Patchy cerebral white  matter hypodensities are similar to the are CT and are nonspecific but compatible with moderate chronic small vessel ischemic disease. Vascular: Atherosclerotic calcification.  No hyperdense vessel. Skull: No fracture or suspicious lesion. Sinuses/Orbits: Increased left sphenoid sinus fluid. Clear mastoid air cells. Bilateral cataract extraction. Other: None. IMPRESSION: 1. No evidence of acute intracranial abnormality. 2. Moderate chronic small vessel ischemic disease and cerebral atrophy. Electronically Signed   By: Dasie Hamburg M.D.   On: 03/01/2024 13:51   DG Chest Portable 1 View Result Date: 02/26/2024 CLINICAL DATA:  Low oxygen EXAM: PORTABLE CHEST 1 VIEW COMPARISON:  Chest x-ray 12/12/2023 FINDINGS: Patient is slightly rotated. There is prominence of soft tissues in the right paratracheal region. This may be accentuated by patient rotation, but mediastinal process is not excluded. Heart  size is normal. The lungs are clear. There is no pleural effusion or pneumothorax. No acute fractures are identified. IMPRESSION: Prominence of soft tissues in the right paratracheal region. This may be accentuated by patient rotation, but mediastinal process is not excluded. Recommend further evaluation with PA and lateral chest x-ray. Electronically Signed   By: Greig Pique M.D.   On: 02/26/2024 18:33    Microbiology: Results for orders placed or performed during the hospital encounter of 02/26/24  Urine Culture     Status: Abnormal   Collection Time: 02/26/24  5:16 PM   Specimen: Urine, Clean Catch  Result Value Ref Range Status   Specimen Description   Final    URINE, CLEAN CATCH Performed at [facility], [address], [place], [place] [address]    Special Requests   Final    NONE Performed at [facility], [address] [place], [place] [address]    Culture 30,000 COLONIES/mL PSEUDOMONAS AERUGINOSA (A)  Final   Report Status 02/29/2024 FINAL  Final   Organism ID, Bacteria PSEUDOMONAS AERUGINOSA (A)  Final      Susceptibility   Pseudomonas aeruginosa - MIC*    MEROPENEM 0.5 SENSITIVE Sensitive     CIPROFLOXACIN  <=0.06 SENSITIVE Sensitive     IMIPENEM <=0.5 SENSITIVE Sensitive     PIP/TAZO Value in next row Sensitive ug/mL     8 SENSITIVEThis is a modified FDA-approved test that has been validated and its performance characteristics determined by the reporting laboratory.  This laboratory is certified under the Clinical Laboratory Improvement Amendments CLIA as qualified to perform high complexity clinical laboratory testing.    CEFEPIME  Value in next row Sensitive      8 SENSITIVEThis is a modified FDA-approved test that has been validated and its performance characteristics determined by the reporting laboratory.  This laboratory is certified under the Clinical Laboratory Improvement Amendments CLIA as qualified to perform high complexity clinical laboratory testing.     CEFTAZIDIME/AVIBACTAM Value in next row Sensitive ug/mL     8 SENSITIVEThis is a modified FDA-approved test that has been validated and its performance characteristics determined by the reporting laboratory.  This laboratory is certified under the Clinical Laboratory Improvement Amendments CLIA as qualified to perform high complexity clinical laboratory testing.    CEFTOLOZANE/TAZOBACTAM Value in next row Sensitive ug/mL     8 SENSITIVEThis is a modified FDA-approved test that has been validated and its performance characteristics determined by the reporting laboratory.  This laboratory is certified under the Clinical Laboratory Improvement Amendments CLIA as qualified to perform high complexity clinical laboratory testing.    * 30,000 COLONIES/mL PSEUDOMONAS AERUGINOSA  Blood Culture (routine x 2)     Status: None  Collection Time: 02/26/24  6:19 PM   Specimen: BLOOD LEFT FOREARM  Result Value Ref Range Status   Specimen Description BLOOD LEFT FOREARM  Final   Special Requests NONE  Final   Culture   Final    NO GROWTH 5 DAYS Performed at [facility], [address] [place], [place] [address]    Report Status 03/02/2024 FINAL  Final  Resp panel by RT-PCR (RSV, Flu A&B, Covid) Anterior Nasal Swab     Status: None   Collection Time: 02/26/24  6:20 PM   Specimen: Anterior Nasal Swab  Result Value Ref Range Status   SARS Coronavirus 2 by RT PCR NEGATIVE NEGATIVE Final    Comment: (NOTE) SARS-CoV-2 target nucleic acids are NOT DETECTED.  The SARS-CoV-2 RNA is generally detectable in upper respiratory specimens during the acute phase of infection. The lowest concentration of SARS-CoV-2 viral copies this assay can detect is 138 copies/mL. A negative result does not preclude SARS-Cov-2 infection and should not be used as the sole basis for treatment or other patient management decisions. A negative result may occur with  improper specimen collection/handling, submission of specimen  other than nasopharyngeal swab, presence of viral mutation(s) within the areas targeted by this assay, and inadequate number of viral copies(<138 copies/mL). A negative result must be combined with clinical observations, patient history, and epidemiological information. The expected result is Negative.  Fact Sheet for Patients:  BloggerCourse.com  Fact Sheet for Healthcare Providers:  SeriousBroker.it  This test is no t yet approved or cleared by the [place]  FDA and  has been authorized for detection and/or diagnosis of SARS-CoV-2 by FDA under an Emergency Use Authorization (EUA). This EUA will remain  in effect (meaning this test can be used) for the duration of the COVID-19 declaration under Section 564(b)(1) of the Act, 21 U.S.C.section 360bbb-3(b)(1), unless the authorization is terminated  or revoked sooner.       Influenza A by PCR NEGATIVE NEGATIVE Final   Influenza B by PCR NEGATIVE NEGATIVE Final    Comment: (NOTE) The Xpert Xpress SARS-CoV-2/FLU/RSV plus assay is intended as an aid in the diagnosis of influenza from Nasopharyngeal swab specimens and should not be used as a sole basis for treatment. Nasal washings and aspirates are unacceptable for Xpert Xpress SARS-CoV-2/FLU/RSV testing.  Fact Sheet for Patients: BloggerCourse.com  Fact Sheet for Healthcare Providers: SeriousBroker.it  This test is not yet approved or cleared by the [place]  FDA and has been authorized for detection and/or diagnosis of SARS-CoV-2 by FDA under an Emergency Use Authorization (EUA). This EUA will remain in effect (meaning this test can be used) for the duration of the COVID-19 declaration under Section 564(b)(1) of the Act, 21 U.S.C. section 360bbb-3(b)(1), unless the authorization is terminated or revoked.     Resp Syncytial Virus by PCR NEGATIVE NEGATIVE Final     Comment: (NOTE) Fact Sheet for Patients: BloggerCourse.com  Fact Sheet for Healthcare Providers: SeriousBroker.it  This test is not yet approved or cleared by the [place]  FDA and has been authorized for detection and/or diagnosis of SARS-CoV-2 by FDA under an Emergency Use Authorization (EUA). This EUA will remain in effect (meaning this test can be used) for the duration of the COVID-19 declaration under Section 564(b)(1) of the Act, 21 U.S.C. section 360bbb-3(b)(1), unless the authorization is terminated or revoked.  Performed at [facility], [address], [place], [place] [address]   Blood Culture (routine x 2)     Status: Abnormal  Collection Time: 02/26/24  6:27 PM   Specimen: Left Antecubital; Blood  Result Value Ref Range Status   Specimen Description   Final    LEFT ANTECUBITAL Performed at [facility], [address], [place], [place] [address]    Special Requests   Final    NONE Performed at [facility], [address] [place], [place] [address]    Culture  Setup Time   Final    GRAM POSITIVE COCCI ANAEROBIC BOTTLE ONLY Gram Stain Report Called to,Read Back By and Verified With: JOHN M. ON 02/27/2024 @4 :18PM BY T. HAMER  CRITICAL RESULT CALLED TO, READ BACK BY AND VERIFIED WITH: RN ASHLEY H 0110 [id number] FCP    Culture (A)  Final    STAPHYLOCOCCUS EPIDERMIDIS THE SIGNIFICANCE OF ISOLATING THIS ORGANISM FROM A SINGLE SET OF BLOOD CULTURES WHEN MULTIPLE SETS ARE DRAWN IS UNCERTAIN. PLEASE NOTIFY THE MICROBIOLOGY DEPARTMENT WITHIN ONE WEEK IF SPECIATION AND SENSITIVITIES ARE REQUIRED. Performed at [facility], [address] [place], [place] [address]    Report Status 02/29/2024 FINAL  Final  Blood Culture ID Panel (Reflexed)     Status: Abnormal   Collection Time: 02/26/24  6:27 PM  Result Value Ref Range Status   Enterococcus faecalis NOT DETECTED NOT DETECTED Final   Enterococcus Faecium NOT  DETECTED NOT DETECTED Final   Listeria monocytogenes NOT DETECTED NOT DETECTED Final   Staphylococcus species DETECTED (A) NOT DETECTED Final    Comment: CRITICAL RESULT CALLED TO, READ BACK BY AND VERIFIED WITH: RN ASHLEY H 0110 [id number] FCP    Staphylococcus aureus (BCID) NOT DETECTED NOT DETECTED Final   Staphylococcus epidermidis DETECTED (A) NOT DETECTED Final    Comment: Methicillin (oxacillin) resistant coagulase negative staphylococcus. Possible blood culture contaminant (unless isolated from more than one blood culture draw or clinical case suggests pathogenicity). No antibiotic treatment is indicated for blood  culture contaminants. CRITICAL RESULT CALLED TO, READ BACK BY AND VERIFIED WITH: RN ASHLEY H 0110 [id number] FCP    Staphylococcus lugdunensis NOT DETECTED NOT DETECTED Final   Streptococcus species NOT DETECTED NOT DETECTED Final   Streptococcus agalactiae NOT DETECTED NOT DETECTED Final   Streptococcus pneumoniae NOT DETECTED NOT DETECTED Final   Streptococcus pyogenes NOT DETECTED NOT DETECTED Final   A.calcoaceticus-baumannii NOT DETECTED NOT DETECTED Final   Bacteroides fragilis NOT DETECTED NOT DETECTED Final   Enterobacterales NOT DETECTED NOT DETECTED Final   Enterobacter cloacae complex NOT DETECTED NOT DETECTED Final   Escherichia coli NOT DETECTED NOT DETECTED Final   Klebsiella aerogenes NOT DETECTED NOT DETECTED Final   Klebsiella oxytoca NOT DETECTED NOT DETECTED Final   Klebsiella pneumoniae NOT DETECTED NOT DETECTED Final   Proteus species NOT DETECTED NOT DETECTED Final   Salmonella species NOT DETECTED NOT DETECTED Final   Serratia marcescens NOT DETECTED NOT DETECTED Final   Haemophilus influenzae NOT DETECTED NOT DETECTED Final   Neisseria meningitidis NOT DETECTED NOT DETECTED Final   Pseudomonas aeruginosa NOT DETECTED NOT DETECTED Final   Stenotrophomonas maltophilia NOT DETECTED NOT DETECTED Final   Candida albicans NOT DETECTED NOT DETECTED Final    Candida auris NOT DETECTED NOT DETECTED Final   Candida glabrata NOT DETECTED NOT DETECTED Final   Candida krusei NOT DETECTED NOT DETECTED Final   Candida parapsilosis NOT DETECTED NOT DETECTED Final   Candida tropicalis NOT DETECTED NOT DETECTED Final   Cryptococcus neoformans/gattii NOT DETECTED NOT DETECTED Final   Methicillin resistance mecA/C DETECTED (A) NOT DETECTED Final  Comment: CRITICAL RESULT CALLED TO, READ BACK BY AND VERIFIED WITH: RN ROSINA VEAR SPIKE [id number] FCP Performed at [facility], [address] [place], [place] [address]     Labs: CBC: No results for input(s): WBC, NEUTROABS, HGB, HCT, MCV, PLT in the last 168 hours.  Basic Metabolic Panel: Recent Labs  Lab 03/01/24 1151 03/02/24 0321 03/03/24 0359 03/04/24 0404 03/05/24 0553  NA 139 139 138 138 134*  K 3.4* 3.8 3.8 4.1 3.6  CL 103 104 100 96* 99  CO2 24 27 29 30 27   GLUCOSE 137* 91 83 94 90  BUN 9 9 9 11 13   CREATININE 0.50 0.50 0.55 0.64 0.59  CALCIUM 8.6* 8.9 8.7* 8.9 8.5*  MG 1.8 1.9 1.8 1.8 1.8   Liver Function Tests: Recent Labs  Lab 03/01/24 1151  AST 14*  ALT 7  ALKPHOS 68  BILITOT 0.5  PROT 5.6*  ALBUMIN 2.5*   CBG: No results for input(s): GLUCAP in the last 168 hours.  Discharge time spent: greater than 30 minutes.  Signed: Alm Schneider, MD [facility] 03/05/2024

## 2024-03-04 NOTE — Progress Notes (Signed)
 PROGRESS NOTE  Donna Barron FMW:[id number] DOB: [birth year] DOA: 02/26/2024 PCP: [name], NP  Brief History:  83 y.o. female with medical history significant of depression, dementia, paroxysmal atrial fibrillation, seizures, GERD, hypertension, and hyperlipidemia who presented with altered mental status.  Recent hospitalization 08/02 to 01/17/24 for urinary tract infection, complicated with acute metabolic encephalopathy. She was diagnosed with E Coli urine infection and was treated with ceftriaxone , placed on prophylactic nitrofurantoin  and estrogen vaginal cream for recurrent urinary tract infections and was discharged to SNF.  Eventually patient was discharged home and was recovering well with home health services.   09/08 had outpatient follow up with primary care with no major changes to her medications.    This time she was noted by her daughter to have a mild change in her mentation about 3 days ago, being less responsive and having poor appetite. The following day she had generalized weakness and was difficult to transfer out of the bed, her mentation continue to worsen. Her urine had a strong odor. Today she was completely dependent to be moved. Because rapid progressive of her symptoms her daughter called EMS and she was brought her to the ED for further evaluation.    At the time of my examination she is able to respond simple questions, most of the information is from her daughter at the bedside.  At home uses walker for ambulation.    Assessment/Plan: Severe Sepsis/Pseudomonas UTI  -present on admission -Severe sepsis with tachypnea, hypotension and acute metabolic encephalopathy, (present on admission)  -01/2024 positive for E coli sensitive to ceftriaxone , but resistant to ampicillin and ciprofloxacin  -initially on ceftriaxone  -IV cefepime  ordered 9/16>>9/18 -9/18 start po cipro  -sepsis physiology resolved   Gram positive Cocci Bacteremia -- represents  contaminant>>MRSE 1/2 sets   Hyperammonemia -likely due to patient's depakote  -discussed with pt's neurologist, Dr. Darice Shivers -given lactulose  with improved ammonia - increased trileptal  to 300 mg bid - decrease depakote  to 125 mg AM, 250 mg PM - repeat ammonia with decreased depakote    Seizure disorder  -continue depakote  and trileptal    Paroxysmal atrial fibrillation  -continue metoprolol  and apixaban    Major neurocognitive disorder - Continue Depakote  -continue lexapro    Acute metabolic encephalopathy -- Improving  -Patient overall improving but remains somewhat somnolent -due to infection, partly from hyperammonemia -9/18--daughter concerned pt is less interactive  -check ammonia (pt on depakote ) -check depakote  level -B12--469 -TSH--1.228 - folate 17.7 -d/c cefepime >>started cipro    Essential hypertension -Hold  furosemide  and continue with metoprolol .  -BP well controlled   Obesity, class 1 -BMI is 33.11    Mixed hyperlipidemia -Continue statin    Goals of Care -I spoke with pt's neurologist, Dr. Darice Shivers who updated me regarding patient -03/02/24--spoke with daughter--daughter endorses overall functional and cognitive decline since 04/2023 - We discussed the patient's current condition and her metabolic encephalopathy.  We discussed the patient's frequent hospitalizations and her high risk for repeated hospitalizations in the future.  We discussed that with each progressive hospitalization the patient will continue to experience functional decline and with her decreased neurologic reserve, she may continue to remain encephalopathic -03/03/24--further GOC with both daughters>>agree to DNR; plan to transition to SNF for now in hopes pt will improve -We discussed what her mother would want if there was no improvement in her current functional and mental capacities and quality of life issues -Advance care planning, including the explanation and discussion of  advance  directives was carried out with the patient and family.  Code status including explanations of Full Code and DNR and alternatives were discussed in detail.  Discussion of end-of-life issues including but not limited palliative care, hospice care and the concept of hospice, other end-of-life care options, power of attorney for health care decisions, living wills, and physician orders for life-sustaining treatment were also discussed with the patient and family.  Total face to face time 26 minutes.       Family Communication:   daughters at bedside 9/20   Consultants:  none   Code Status:  DNR    DVT Prophylaxis:  apixaban      Procedures: As Listed in Progress Note Above   Antibiotics: Cefepime  9/16>>9/18 Cipro  9/18>>            Subjective: Pt is pleasantly confused.  Denies f/c, cp, sob, abd pain  Objective: Vitals:   03/03/24 1816 03/03/24 1947 03/04/24 0614 03/04/24 1355  BP: (!) 121/57 (!) 100/37 128/64 117/68  Pulse:  74 72 69  Resp:  18 16 18   Temp:  98.3 F (36.8 C) 97.7 F (36.5 C) 97.6 F (36.4 C)  TempSrc:  Oral Oral Oral  SpO2:  93% 96% 93%  Weight:      Height:        Intake/Output Summary (Last 24 hours) at 03/04/2024 1601 Last data filed at 03/04/2024 1013 Gross per 24 hour  Intake 360 ml  Output 1350 ml  Net -990 ml   Weight change:  Exam:  General:  Pt is alert, follows commands appropriately, not in acute distress HEENT: No icterus, No thrush, No neck mass, [place]/AT Cardiovascular: RRR, S1/S2, no rubs, no gallops Respiratory: bibasilar rales. No wheeze Abdomen: Soft/+BS, non tender, non distended, no guarding Extremities: Nonpitting edema, No lymphangitis, No petechiae, No rashes, no synovitis   Data Reviewed: I have personally reviewed following labs and imaging studies Basic Metabolic Panel: Recent Labs  Lab 02/27/24 0458 03/01/24 1151 03/02/24 0321 03/03/24 0359 03/04/24 0404  NA 139 139 139 138 138  K 3.5 3.4* 3.8  3.8 4.1  CL 102 103 104 100 96*  CO2 26 24 27 29 30   GLUCOSE 94 137* 91 83 94  BUN 9 9 9 9 11   CREATININE 0.62 0.50 0.50 0.55 0.64  CALCIUM 8.8* 8.6* 8.9 8.7* 8.9  MG  --  1.8 1.9 1.8 1.8   Liver Function Tests: Recent Labs  Lab 02/26/24 1744 03/01/24 1151  AST 17 14*  ALT 9 7  ALKPHOS 83 68  BILITOT 0.5 0.5  PROT 6.9 5.6*  ALBUMIN 3.3* 2.5*   No results for input(s): LIPASE, AMYLASE in the last 168 hours. Recent Labs  Lab 03/01/24 1151 03/02/24 0321 03/03/24 0359  AMMONIA 41* 53* 20   Coagulation Profile: Recent Labs  Lab 02/26/24 1852  INR 1.2   CBC: Recent Labs  Lab 02/26/24 1744 02/27/24 0458  WBC 7.8 7.8  NEUTROABS 5.2  --   HGB 15.7* 13.3  HCT 47.3* 40.5  MCV 92.9 95.7  PLT 285 260   Cardiac Enzymes: No results for input(s): CKTOTAL, CKMB, CKMBINDEX, TROPONINI in the last 168 hours. BNP: Invalid input(s): POCBNP CBG: No results for input(s): GLUCAP in the last 168 hours. HbA1C: No results for input(s): HGBA1C in the last 72 hours. Urine analysis:    Component Value Date/Time   COLORURINE YELLOW 02/26/2024 1716   APPEARANCEUR CLOUDY (A) 02/26/2024 1716   LABSPEC 1.016 02/26/2024 1716   PHURINE 7.0  02/26/2024 1716   GLUCOSEU NEGATIVE 02/26/2024 1716   HGBUR SMALL (A) 02/26/2024 1716   BILIRUBINUR NEGATIVE 02/26/2024 1716   KETONESUR 5 (A) 02/26/2024 1716   PROTEINUR 30 (A) 02/26/2024 1716   NITRITE NEGATIVE 02/26/2024 1716   LEUKOCYTESUR MODERATE (A) 02/26/2024 1716   Sepsis Labs: @LABRCNTIP (procalcitonin:4,lacticidven:4) ) Recent Results (from the past 240 hours)  Urine Culture     Status: Abnormal   Collection Time: 02/26/24  5:16 PM   Specimen: Urine, Clean Catch  Result Value Ref Range Status   Specimen Description   Final    URINE, CLEAN CATCH Performed at [facility], [address], [place], [place] [address]    Special Requests   Final    NONE Performed at [facility], [address] [place],  [place] [address]    Culture 30,000 COLONIES/mL PSEUDOMONAS AERUGINOSA (A)  Final   Report Status 02/29/2024 FINAL  Final   Organism ID, Bacteria PSEUDOMONAS AERUGINOSA (A)  Final      Susceptibility   Pseudomonas aeruginosa - MIC*    MEROPENEM 0.5 SENSITIVE Sensitive     CIPROFLOXACIN  <=0.06 SENSITIVE Sensitive     IMIPENEM <=0.5 SENSITIVE Sensitive     PIP/TAZO Value in next row Sensitive ug/mL     8 SENSITIVEThis is a modified FDA-approved test that has been validated and its performance characteristics determined by the reporting laboratory.  This laboratory is certified under the Clinical Laboratory Improvement Amendments CLIA as qualified to perform high complexity clinical laboratory testing.    CEFEPIME  Value in next row Sensitive      8 SENSITIVEThis is a modified FDA-approved test that has been validated and its performance characteristics determined by the reporting laboratory.  This laboratory is certified under the Clinical Laboratory Improvement Amendments CLIA as qualified to perform high complexity clinical laboratory testing.    CEFTAZIDIME/AVIBACTAM Value in next row Sensitive ug/mL     8 SENSITIVEThis is a modified FDA-approved test that has been validated and its performance characteristics determined by the reporting laboratory.  This laboratory is certified under the Clinical Laboratory Improvement Amendments CLIA as qualified to perform high complexity clinical laboratory testing.    CEFTOLOZANE/TAZOBACTAM Value in next row Sensitive ug/mL     8 SENSITIVEThis is a modified FDA-approved test that has been validated and its performance characteristics determined by the reporting laboratory.  This laboratory is certified under the Clinical Laboratory Improvement Amendments CLIA as qualified to perform high complexity clinical laboratory testing.    * 30,000 COLONIES/mL PSEUDOMONAS AERUGINOSA  Blood Culture (routine x 2)     Status: None   Collection Time: 02/26/24  6:19 PM   Specimen:  BLOOD LEFT FOREARM  Result Value Ref Range Status   Specimen Description BLOOD LEFT FOREARM  Final   Special Requests NONE  Final   Culture   Final    NO GROWTH 5 DAYS Performed at [facility], [address] [place], [place] [address]    Report Status 03/02/2024 FINAL  Final  Resp panel by RT-PCR (RSV, Flu A&B, Covid) Anterior Nasal Swab     Status: None   Collection Time: 02/26/24  6:20 PM   Specimen: Anterior Nasal Swab  Result Value Ref Range Status   SARS Coronavirus 2 by RT PCR NEGATIVE NEGATIVE Final    Comment: (NOTE) SARS-CoV-2 target nucleic acids are NOT DETECTED.  The SARS-CoV-2 RNA is generally detectable in upper respiratory specimens during the acute phase of infection. The lowest concentration of SARS-CoV-2 viral copies this assay  can detect is 138 copies/mL. A negative result does not preclude SARS-Cov-2 infection and should not be used as the sole basis for treatment or other patient management decisions. A negative result may occur with  improper specimen collection/handling, submission of specimen other than nasopharyngeal swab, presence of viral mutation(s) within the areas targeted by this assay, and inadequate number of viral copies(<138 copies/mL). A negative result must be combined with clinical observations, patient history, and epidemiological information. The expected result is Negative.  Fact Sheet for Patients:  BloggerCourse.com  Fact Sheet for Healthcare Providers:  SeriousBroker.it  This test is no t yet approved or cleared by the [place]  FDA and  has been authorized for detection and/or diagnosis of SARS-CoV-2 by FDA under an Emergency Use Authorization (EUA). This EUA will remain  in effect (meaning this test can be used) for the duration of the COVID-19 declaration under Section 564(b)(1) of the Act, 21 U.S.C.section 360bbb-3(b)(1), unless the authorization is terminated  or revoked  sooner.       Influenza A by PCR NEGATIVE NEGATIVE Final   Influenza B by PCR NEGATIVE NEGATIVE Final    Comment: (NOTE) The Xpert Xpress SARS-CoV-2/FLU/RSV plus assay is intended as an aid in the diagnosis of influenza from Nasopharyngeal swab specimens and should not be used as a sole basis for treatment. Nasal washings and aspirates are unacceptable for Xpert Xpress SARS-CoV-2/FLU/RSV testing.  Fact Sheet for Patients: BloggerCourse.com  Fact Sheet for Healthcare Providers: SeriousBroker.it  This test is not yet approved or cleared by the [place]  FDA and has been authorized for detection and/or diagnosis of SARS-CoV-2 by FDA under an Emergency Use Authorization (EUA). This EUA will remain in effect (meaning this test can be used) for the duration of the COVID-19 declaration under Section 564(b)(1) of the Act, 21 U.S.C. section 360bbb-3(b)(1), unless the authorization is terminated or revoked.     Resp Syncytial Virus by PCR NEGATIVE NEGATIVE Final    Comment: (NOTE) Fact Sheet for Patients: BloggerCourse.com  Fact Sheet for Healthcare Providers: SeriousBroker.it  This test is not yet approved or cleared by the [place]  FDA and has been authorized for detection and/or diagnosis of SARS-CoV-2 by FDA under an Emergency Use Authorization (EUA). This EUA will remain in effect (meaning this test can be used) for the duration of the COVID-19 declaration under Section 564(b)(1) of the Act, 21 U.S.C. section 360bbb-3(b)(1), unless the authorization is terminated or revoked.  Performed at [facility], [address] [place], [place] [address]   Blood Culture (routine x 2)     Status: Abnormal   Collection Time: 02/26/24  6:27 PM   Specimen: Left Antecubital; Blood  Result Value Ref Range Status   Specimen Description   Final    LEFT ANTECUBITAL Performed at [facility], [address], [place], [place] [address]    Special Requests   Final    NONE Performed at [facility], [address] [place], [place] [address]    Culture  Setup Time   Final    GRAM POSITIVE COCCI ANAEROBIC BOTTLE ONLY Gram Stain Report Called to,Read Back By and Verified With: JOHN M. ON 02/27/2024 @4 :18PM BY T. HAMER  CRITICAL RESULT CALLED TO, READ BACK BY AND VERIFIED WITH: RN ASHLEY H 0110 [id number] FCP    Culture (A)  Final    STAPHYLOCOCCUS EPIDERMIDIS THE SIGNIFICANCE OF ISOLATING THIS ORGANISM FROM A SINGLE SET OF BLOOD CULTURES WHEN MULTIPLE SETS ARE DRAWN IS UNCERTAIN. PLEASE NOTIFY THE MICROBIOLOGY  DEPARTMENT WITHIN ONE WEEK IF SPECIATION AND SENSITIVITIES ARE REQUIRED. Performed at [facility], [address], [place], [place] [address]    Report Status 02/29/2024 FINAL  Final  Blood Culture ID Panel (Reflexed)     Status: Abnormal   Collection Time: 02/26/24  6:27 PM  Result Value Ref Range Status   Enterococcus faecalis NOT DETECTED NOT DETECTED Final   Enterococcus Faecium NOT DETECTED NOT DETECTED Final   Listeria monocytogenes NOT DETECTED NOT DETECTED Final   Staphylococcus species DETECTED (A) NOT DETECTED Final    Comment: CRITICAL RESULT CALLED TO, READ BACK BY AND VERIFIED WITH: RN ASHLEY H 0110 [id number] FCP    Staphylococcus aureus (BCID) NOT DETECTED NOT DETECTED Final   Staphylococcus epidermidis DETECTED (A) NOT DETECTED Final    Comment: Methicillin (oxacillin) resistant coagulase negative staphylococcus. Possible blood culture contaminant (unless isolated from more than one blood culture draw or clinical case suggests pathogenicity). No antibiotic treatment is indicated for blood  culture contaminants. CRITICAL RESULT CALLED TO, READ BACK BY AND VERIFIED WITH: RN ASHLEY H 0110 [id number] FCP    Staphylococcus lugdunensis NOT DETECTED NOT DETECTED Final   Streptococcus species NOT DETECTED NOT DETECTED Final   Streptococcus agalactiae NOT  DETECTED NOT DETECTED Final   Streptococcus pneumoniae NOT DETECTED NOT DETECTED Final   Streptococcus pyogenes NOT DETECTED NOT DETECTED Final   A.calcoaceticus-baumannii NOT DETECTED NOT DETECTED Final   Bacteroides fragilis NOT DETECTED NOT DETECTED Final   Enterobacterales NOT DETECTED NOT DETECTED Final   Enterobacter cloacae complex NOT DETECTED NOT DETECTED Final   Escherichia coli NOT DETECTED NOT DETECTED Final   Klebsiella aerogenes NOT DETECTED NOT DETECTED Final   Klebsiella oxytoca NOT DETECTED NOT DETECTED Final   Klebsiella pneumoniae NOT DETECTED NOT DETECTED Final   Proteus species NOT DETECTED NOT DETECTED Final   Salmonella species NOT DETECTED NOT DETECTED Final   Serratia marcescens NOT DETECTED NOT DETECTED Final   Haemophilus influenzae NOT DETECTED NOT DETECTED Final   Neisseria meningitidis NOT DETECTED NOT DETECTED Final   Pseudomonas aeruginosa NOT DETECTED NOT DETECTED Final   Stenotrophomonas maltophilia NOT DETECTED NOT DETECTED Final   Candida albicans NOT DETECTED NOT DETECTED Final   Candida auris NOT DETECTED NOT DETECTED Final   Candida glabrata NOT DETECTED NOT DETECTED Final   Candida krusei NOT DETECTED NOT DETECTED Final   Candida parapsilosis NOT DETECTED NOT DETECTED Final   Candida tropicalis NOT DETECTED NOT DETECTED Final   Cryptococcus neoformans/gattii NOT DETECTED NOT DETECTED Final   Methicillin resistance mecA/C DETECTED (A) NOT DETECTED Final    Comment: CRITICAL RESULT CALLED TO, READ BACK BY AND VERIFIED WITH: RN ROSINA VEAR SPIKE [id number] FCP Performed at [facility], [address] [place], [place] [address]      Scheduled Meds:  apixaban   5 mg Oral BID   ascorbic acid   500 mg Oral Daily   ciprofloxacin   500 mg Oral BID   divalproex   125 mg Oral q morning   divalproex   250 mg Oral QHS   escitalopram   20 mg Oral QHS   feeding supplement  237 mL Oral BID BM   melatonin  3 mg Oral QHS   metoprolol  tartrate  25 mg Oral BID    OXcarbazepine   300 mg Oral BID   pantoprazole   40 mg Oral Daily   polyethylene glycol  17 g Oral Daily   senna  2 tablet Oral Daily   simvastatin   40 mg Oral QHS  Continuous Infusions:  Procedures/Studies: CT HEAD WO CONTRAST ( ) Result Date: 03/01/2024 CLINICAL DATA:  Mental status change, unknown cause. EXAM: CT HEAD WITHOUT CONTRAST TECHNIQUE: Contiguous axial images were obtained from the base of the skull through the vertex without intravenous contrast. RADIATION DOSE REDUCTION: This exam was performed according to the departmental dose-optimization program which includes automated exposure control, adjustment of the mA and/or kV according to patient size and/or use of iterative reconstruction technique. COMPARISON:  Head CT 01/14/2024 and MRI 01/15/2024 FINDINGS: Brain: There is no evidence of an acute infarct, intracranial hemorrhage, mass, midline shift, or extra-axial fluid collection. There is moderate cerebral atrophy. Patchy cerebral white matter hypodensities are similar to the are CT and are nonspecific but compatible with moderate chronic small vessel ischemic disease. Vascular: Atherosclerotic calcification.  No hyperdense vessel. Skull: No fracture or suspicious lesion. Sinuses/Orbits: Increased left sphenoid sinus fluid. Clear mastoid air cells. Bilateral cataract extraction. Other: None. IMPRESSION: 1. No evidence of acute intracranial abnormality. 2. Moderate chronic small vessel ischemic disease and cerebral atrophy. Electronically Signed   By: Dasie Hamburg M.D.   On: 03/01/2024 13:51   DG Chest Portable 1 View Result Date: 02/26/2024 CLINICAL DATA:  Low oxygen EXAM: PORTABLE CHEST 1 VIEW COMPARISON:  Chest x-ray 12/12/2023 FINDINGS: Patient is slightly rotated. There is prominence of soft tissues in the right paratracheal region. This may be accentuated by patient rotation, but mediastinal process is not excluded. Heart size is normal. The lungs are clear. There is no pleural  effusion or pneumothorax. No acute fractures are identified. IMPRESSION: Prominence of soft tissues in the right paratracheal region. This may be accentuated by patient rotation, but mediastinal process is not excluded. Recommend further evaluation with PA and lateral chest x-ray. Electronically Signed   By: Greig Pique M.D.   On: 02/26/2024 18:33    Alm Schneider, DO  [facility]  If 7PM-7AM, please contact night-coverage [email or web address] Password [email or web address] 03/04/2024, 4:01 PM   LOS: 7 days

## 2024-03-04 NOTE — Plan of Care (Signed)
  Problem: Health Behavior/Discharge Planning: Goal: Ability to manage health-related needs will improve Outcome: Progressing   Problem: Clinical Measurements: Goal: Ability to maintain clinical measurements within normal limits will improve Outcome: Progressing Goal: Will remain free from infection Outcome: Progressing Goal: Diagnostic test results will improve Outcome: Progressing Goal: Respiratory complications will improve Outcome: Progressing Goal: Cardiovascular complication will be avoided Outcome: Progressing   Problem: Activity: Goal: Risk for activity intolerance will decrease Outcome: Progressing   Problem: Nutrition: Goal: Adequate nutrition will be maintained Outcome: Progressing   Problem: Elimination: Goal: Will not experience complications related to bowel motility Outcome: Progressing Goal: Will not experience complications related to urinary retention Outcome: Progressing   Problem: Pain Managment: Goal: General experience of comfort will improve and/or be controlled Outcome: Progressing   Problem: Safety: Goal: Ability to remain free from injury will improve Outcome: Progressing   Problem: Skin Integrity: Goal: Risk for impaired skin integrity will decrease Outcome: Progressing

## 2024-03-05 ENCOUNTER — Encounter: Payer: Self-pay | Admitting: Internal Medicine

## 2024-03-05 ENCOUNTER — Non-Acute Institutional Stay (SKILLED_NURSING_FACILITY): Payer: Self-pay | Admitting: Internal Medicine

## 2024-03-05 DIAGNOSIS — A419 Sepsis, unspecified organism: Secondary | ICD-10-CM | POA: Diagnosis not present

## 2024-03-05 DIAGNOSIS — N3281 Overactive bladder: Secondary | ICD-10-CM | POA: Diagnosis not present

## 2024-03-05 DIAGNOSIS — F0390 Unspecified dementia without behavioral disturbance: Secondary | ICD-10-CM | POA: Diagnosis not present

## 2024-03-05 DIAGNOSIS — G9341 Metabolic encephalopathy: Secondary | ICD-10-CM

## 2024-03-05 DIAGNOSIS — N390 Urinary tract infection, site not specified: Secondary | ICD-10-CM

## 2024-03-05 LAB — BASIC METABOLIC PANEL WITH GFR
Anion gap: 8 (ref 5–15)
BUN: 13 mg/dL (ref 8–23)
CO2: 27 mmol/L (ref 22–32)
Calcium: 8.5 mg/dL — ABNORMAL LOW (ref 8.9–10.3)
Chloride: 99 mmol/L (ref 98–111)
Creatinine, Ser: 0.59 mg/dL (ref 0.44–1.00)
GFR, Estimated: >60 mL/min (ref >60)
Glucose, Bld: 90 mg/dL (ref 70–99)
Potassium: 3.6 mmol/L (ref 3.5–5.1)
Sodium: 134 mmol/L — ABNORMAL LOW (ref 135–145)

## 2024-03-05 LAB — AMMONIA: Ammonia: 31 umol/L (ref 9–35)

## 2024-03-05 LAB — MAGNESIUM: Magnesium: 1.8 mg/dL (ref 1.7–2.4)

## 2024-03-05 MED ORDER — MAGNESIUM SULFATE 2 GM/50ML IV SOLN
2.0000 g | Freq: Once | INTRAVENOUS | Status: AC
  Administered 2024-03-05: 2 g via INTRAVENOUS
  Filled 2024-03-05: qty 50

## 2024-03-05 NOTE — Patient Instructions (Signed)
 See assessment and plan under each diagnosis in the problem list and acutely for this visit

## 2024-03-05 NOTE — Assessment & Plan Note (Signed)
 She has no comprehension of why she was hospitalized.  Review of systems is totally negative.  I discussed the significance of the cerebral atrophy in the context of the ischemic white matter changes which suggest a diagnosis of vascular dementia with her daughter.

## 2024-03-05 NOTE — Progress Notes (Signed)
 Nurse at bedside,patient alert and oriented to person,and place,confused to time,and situation.Patient was able to take her medications whole one at a time in applesauce with no issues.Blood pressure was 112/51,heart rate 75,Dr Tat notified. Plan of care on going.

## 2024-03-05 NOTE — Progress Notes (Signed)
 Patient discharged to [facility],report called and given to Boby Molt LPN. IV discontinued,catheter intact.Accompanied by staff to awaiting floor.

## 2024-03-05 NOTE — Assessment & Plan Note (Signed)
 Urologic, GYN evaluations are appropriate postdischarge from the SNF because of recurrent UTIs.

## 2024-03-05 NOTE — Progress Notes (Signed)
 Mobility Specialist Progress Note:    03/05/24 0945  Mobility  Activity Stood at bedside;Dangled on edge of bed  Level of Assistance Maximum assist, patient does 25-49%  Assistive Device Front wheel walker  Distance Ambulated (ft) 0 ft  Range of Motion/Exercises Active;All extremities  Activity Response Tolerated well  Mobility Referral Yes  Mobility visit 1 Mobility  Mobility Specialist Start Time (ACUTE ONLY) 0945  Mobility Specialist Stop Time (ACUTE ONLY) 1000  Mobility Specialist Time Calculation (min) (ACUTE ONLY) 15 min   Pt received in bed, NT requesting assistance trying to transfer to [facility]. Required MaxA+2 to stand, unable to transfer d/t LE weakness. Returned supine, NTs and family in room. All needs met.  Bekah Igoe Mobility Specialist Please contact via Special educational needs teacher or  Rehab office at [phone]

## 2024-03-05 NOTE — TOC Transition Note (Signed)
 Transition of Care [facility]) - Discharge Note   Patient Details  Name: Donna Barron MRN: [id number] Date of Birth: May 09, 1941  Transition of Care [facility]) CM/SW Contact:  Sharlyne Stabs, RN Phone Number: 03/05/2024, 10:36 AM   Clinical Narrative:   Patient discharging to [facility] nursing center. CM updated her daughter, [name]. DC summary sent in the hub, waiting for room number and RN will call report.  Updated Cory with Hedda. Patient only has 10 SNF days left. They will need to get home health services and maybe SW to discuss goals and LTC plan.    Final next level of care: Skilled Nursing Facility Barriers to Discharge: Barriers Resolved   Patient Goals and CMS Choice Patient states their goals for this hospitalization and ongoing recovery are:: agreeable to SNF CMS Medicare.gov Compare Post Acute Care list provided to:: Patient Represenative (must comment) Choice offered to / list presented to : Adult Children [place] ownership interest in [facility].provided to:: Adult Children    Discharge Placement    Patient to be transferred to facility by: Staff Name of family member notified: [name] Patient and family notified of of transfer: 03/05/24  Discharge Plan and Services Additional resources added to the After Visit Summary for      [facility] Agency: [facility]     Social Drivers of Health (SDOH) Interventions SDOH Screenings   Food Insecurity: No Food Insecurity (02/27/2024)  Housing: Low Risk  (03/04/2024)  Transportation Needs: No Transportation Needs (02/27/2024)  Utilities: Not At Risk (02/27/2024)  Financial Resource Strain: Low Risk  (08/11/2023)   Received from [facility]  Physical Activity: Unknown (01/13/2023)   Received from [facility]  Social Connections: Unknown (02/27/2024)  Recent Concern: Social Connections - Moderately Isolated (12/12/2023)  Stress: No Stress Concern Present (01/13/2023)   Received from [facility]  Tobacco Use: Low  Risk  (02/26/2024)     Readmission Risk Interventions    02/28/2024   10:31 AM 10/13/2023    9:11 PM  Readmission Risk Prevention Plan  Post Dischage Appt  Complete  Medication Screening  Complete  Transportation Screening Complete Complete  PCP or Specialist Appt within 5-7 Days Not Complete   Home Care Screening Complete   Medication Review (RN CM) Complete

## 2024-03-05 NOTE — Assessment & Plan Note (Signed)
 I have asked the daughter to verify with Dr. Darol that a cystoscopy has been performed to rule out any bladder lesions which might predispose her to recurrent UTIs.

## 2024-03-05 NOTE — Assessment & Plan Note (Signed)
 Acute AMS is a recurrent picture with the recurrent UTIs.  This is superimposed on dementia with CT scan findings suggesting vascular dementia.

## 2024-03-05 NOTE — Progress Notes (Unsigned)
 NURSING HOME LOCATION:  [facility] ROOM NUMBER:  119 P  CODE STATUS:  DNR  PCP: [name], NP   This is a nursing facility  visit for admission for rehab..  Interim medical record and care since last SNF visit was updated with review of diagnostic studies and change in clinical status since last visit were documented.  HPI: The patient was rehospitalized 9/14 - 03/05/2024 presenting with altered mental status.  This was in the context of a recent hospitalization 8/2 - 01/17/2024 for UTI complicated by acute metabolic encephalopathy.   After discharge from the SNF she was seen by her PCP on 9/11 and was clinically stable; no major changes were made to her medications. Her daughter noted mild change in mentation approximately 3 days prior to the 9/14 admission with less responsiveness and poor appetite.  2 days PTA she exhibited generalized weakness and had difficulty mobilizing even with help.  Her mentation continued to deteriorate.  Urine had a strong odor.  The day of admission she was completely dependent for all activities and mobilization. In the ED she was able to respond to simple questions; daughter was primary historian.  Severe sepsis due to Pseudomonas UTI was diagnosed. Septic criteria were met with tachypnea, hypotension and AMS.  Ceftriaxone  was initiated with subsequent transition to cefepime  9/16 - 9/18.  On 9/18 oral Cipro  was initiated as sepsis physiology had resolved.  She completed a total of 7 days of antibiotics as of 9/22. She was placed on prophylactic nitrofurantoin  and estrogen vaginal cream because of recurrent UTIs.   While hospitalized she did have waxing and waning episodes of mental status with some periods of less responsiveness.  No seizure activity was noted.  An elevated ammonia level of 53 was attributed to Depakote .  Neurologist Dr. Darice Shivers was consulted; the patient received lactulose  with improvement in the ammonia level to 31  (9-35). Depakote  dosage was decreased slightly to 125 mg in the morning and 250 mg in the evening and Trileptal  dose was increased to a dosage of 300 mg twice daily. B12 was normal at 469, TSH therapeutic at 1.228; and folate normal at 17.7.  White blood count was stable and normal.  Initial H/H were elevated to 15.7/47.3 but corrected to 13.3/40.5 with rehydration. The daughter did validate that the patient has had an overall functional and cognitive decline since November 2024.  CT of the head 9/18 revealed no acute process but did demonstrate moderate cerebral atrophy and patchy cerebral white matter hypodensities compatible with moderate chronic small vessel ischemic disease.  Additionally atherosclerotic calcifications were noted in the vessels. She was discharged to SNF for rehab @ PT/OT 's recommendation.  Review of systems: She states that she is doing good and has no active complaints. She denied having dysphagia; but her daughter stated that 1 day while hospitalized she had difficulty swallowing her medications and was placed on a minced diet. Her daughter states that she has seen her Urogynecologist at [facility] for Botox injections for urinary incontinence.  Constitutional: No fever, significant weight change, fatigue  Eyes: No redness, discharge, pain, vision change ENT/mouth: No nasal congestion,  purulent discharge, earache, change in hearing, sore throat  Cardiovascular: No chest pain, palpitations, paroxysmal nocturnal dyspnea, claudication, edema  Respiratory: No cough, sputum production, hemoptysis, DOE, significant snoring, apnea   Gastrointestinal: No heartburn, dysphagia, abdominal pain, nausea /vomiting, rectal bleeding, melena, change in bowels Genitourinary: No dysuria, hematuria, pyuria Musculoskeletal: No joint stiffness, joint swelling,  weakness, pain Dermatologic: No rash, pruritus, change in appearance of skin Neurologic: No dizziness, headache, syncope, seizures,  numbness, tingling Psychiatric: No significant anxiety, depression, insomnia, anorexia Endocrine: No change in hair/skin/nails, excessive thirst, excessive hunger, excessive urination  Hematologic/lymphatic: No significant bruising, lymphadenopathy, abnormal bleeding Allergy/immunology: No itchy/watery eyes, significant sneezing, urticaria, angioedema  Physical exam:  Pertinent or positive findings: She stares blankly and exhibits minimal verbal interaction.  Answers were monosyllabic no.  Eyebrows are decreased in density.  She has complete dentures.  Heart rate is slow.  Breath sounds are decreased; there was an isolated inspiratory pop in the left anterior chest which was not sustained.  Abdomen is protuberant.  Pedal pulses are palpable.  There is 1/2+ edema at the left sock line and trace at the right sock line.  The skin over the shins tends to be somewhat scaly.  General appearance: Adequately nourished; no acute distress, increased work of breathing is present.   Lymphatic: No lymphadenopathy about the head, neck, axilla. Eyes: No conjunctival inflammation or lid edema is present. There is no scleral icterus. Ears:  External ear exam shows no significant lesions or deformities.   Nose:  External nasal examination shows no deformity or inflammation. Nasal mucosa are pink and moist without lesions, exudates Oral exam:  Lips and gums are healthy appearing. There is no oropharyngeal erythema or exudate. Neck:  No thyromegaly, masses, tenderness noted.    Heart:  No gallop, murmur, click, rub .  Lungs: without wheezes, rhonchi, rales, rubs. Abdomen: Bowel sounds are normal. Abdomen is soft and nontender with no organomegaly, hernias, masses. GU: Deferred  Extremities:  No cyanosis, clubbing  Neurologic exam :Balance, Rhomberg, finger to nose testing could not be completed due to clinical state Skin: Warm & dry w/o tenting. No significant rash.  See summary under each active problem in  the Problem List with associated updated therapeutic plan:  Sepsis secondary to UTI [facility]) Actually C&S revealed only 30,000 colonies, not the 100,000 colonies necessary for diagnosis of UTI.  Urologic, GYN evaluations are appropriate postdischarge from the SNF because of recurrent UTIs.  Acute metabolic encephalopathy Acute AMS is a recurrent picture with the recurrent UTIs.  This is superimposed on dementia with CT scan findings suggesting vascular dementia.  Overactive bladder I have asked the daughter to verify with Dr. Darol that a cystoscopy has been performed to rule out any bladder lesions which might predispose her to recurrent UTIs.  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  Review of systems is totally negative.  I discussed the significance of the cerebral atrophy in the context of the ischemic white matter changes which suggest a diagnosis of vascular dementia with her daughter.

## 2024-03-05 NOTE — Care Management Important Message (Signed)
 Important Message  Patient Details  Name: Donna Barron MRN: [id number] Date of Birth: December 24, 1940   Important Message Given:  Yes - Medicare IM     Donna Barron [name] 03/05/2024, 11:25 AM

## 2024-03-05 NOTE — Plan of Care (Signed)

## 2024-03-15 ENCOUNTER — Non-Acute Institutional Stay (SKILLED_NURSING_FACILITY): Payer: Self-pay | Admitting: Adult Health

## 2024-03-15 ENCOUNTER — Other Ambulatory Visit: Payer: Self-pay | Admitting: Adult Health

## 2024-03-15 ENCOUNTER — Encounter: Payer: Self-pay | Admitting: Adult Health

## 2024-03-15 DIAGNOSIS — G40909 Epilepsy, unspecified, not intractable, without status epilepticus: Secondary | ICD-10-CM

## 2024-03-15 DIAGNOSIS — N390 Urinary tract infection, site not specified: Secondary | ICD-10-CM

## 2024-03-15 DIAGNOSIS — F0390 Unspecified dementia without behavioral disturbance: Secondary | ICD-10-CM

## 2024-03-15 DIAGNOSIS — A419 Sepsis, unspecified organism: Secondary | ICD-10-CM | POA: Diagnosis not present

## 2024-03-15 MED ORDER — DIVALPROEX SODIUM 125 MG PO CSDR: 250.0000 mg | DELAYED_RELEASE_CAPSULE | Freq: Every day | ORAL | 0 refills | Status: AC

## 2024-03-15 MED ORDER — NITROFURANTOIN MONOHYD MACRO 100 MG PO CAPS: 100.0000 mg | ORAL_CAPSULE | Freq: Every day | ORAL | 0 refills | Status: AC

## 2024-03-15 MED ORDER — OXCARBAZEPINE 300 MG PO TABS: 300.0000 mg | ORAL_TABLET | Freq: Two times a day (BID) | ORAL | Status: AC

## 2024-03-15 MED ORDER — SIMVASTATIN 40 MG PO TABS: 40.0000 mg | ORAL_TABLET | Freq: Every day | ORAL | 0 refills | Status: AC

## 2024-03-15 MED ORDER — APIXABAN 5 MG PO TABS: 5.0000 mg | ORAL_TABLET | Freq: Two times a day (BID) | ORAL | 0 refills | Status: AC

## 2024-03-15 MED ORDER — POTASSIUM CHLORIDE CRYS ER 20 MEQ PO TBCR: 20.0000 meq | EXTENDED_RELEASE_TABLET | Freq: Every day | ORAL | 0 refills | Status: DC

## 2024-03-15 MED ORDER — METOPROLOL TARTRATE 25 MG PO TABS: 25.0000 mg | ORAL_TABLET | Freq: Two times a day (BID) | ORAL | 0 refills | Status: DC

## 2024-03-15 MED ORDER — OMEPRAZOLE 40 MG PO CPDR: 40.0000 mg | DELAYED_RELEASE_CAPSULE | Freq: Every day | ORAL | 0 refills | Status: AC

## 2024-03-15 MED ORDER — DIVALPROEX SODIUM 125 MG PO CSDR: 125.0000 mg | DELAYED_RELEASE_CAPSULE | Freq: Every morning | ORAL | 0 refills | Status: AC

## 2024-03-15 MED ORDER — NYSTATIN 100000 UNIT/GM EX OINT: 1.0000 | TOPICAL_OINTMENT | Freq: Two times a day (BID) | CUTANEOUS | 0 refills | Status: AC | PRN

## 2024-03-15 MED ORDER — ESCITALOPRAM OXALATE 20 MG PO TABS: 20.0000 mg | ORAL_TABLET | Freq: Every day | ORAL | 0 refills | Status: AC

## 2024-03-15 MED ORDER — VITAMIN D (ERGOCALCIFEROL) 1.25 MG (50000 UNIT) PO CAPS: 50000.0000 [IU] | ORAL_CAPSULE | ORAL | 0 refills | Status: AC

## 2024-03-15 MED ORDER — FOLIC ACID 1 MG PO TABS: 1.0000 mg | ORAL_TABLET | Freq: Every day | ORAL | 0 refills | Status: AC

## 2024-03-15 NOTE — Progress Notes (Signed)
 Location:  [facility] Room Number: 119-P Place of Service:  SNF (31) Provider: Barnie Seip, NP  CODE STATUS: FULL CODE  Allergies  Allergen Reactions   Codeine  Swelling    Swelling of lips   Hydrocodone  Swelling    Lip swelling   Avelox [Moxifloxacin Hcl In Nacl] Other (See Comments)    Insomnia    Oxybutynin Palpitations and Other (See Comments)    Tongue felt coated     Chief Complaint  Patient presents with   Discharge Note    Discharge from SNF    HPI:  She is being discharged to home with home health with pt/ot. She will not need dme. She will need her prescriptions written and will need to follow up with her medical provider. She had been hospitalized with Severe sepsis due to Pseudomonas UTI. She was admitted to this facility for short term rehab: therapy: ambulate 15 feet with walker and moderate assist; upper body max assist; lower body dependent assist; brp: dependent assist.   Past Medical History:  Diagnosis Date   Anxiety    Cataract    Colon polyps    Dementia (HCC)    Depression    Dysrhythmia    heart palipations   Gallstones    GERD (gastroesophageal reflux disease)    HTN (hypertension)    Hypercholesteremia    PONV (postoperative nausea and vomiting)     Past Surgical History:  Procedure Laterality Date   APPENDECTOMY     ARTHROSCOPIC REPAIR ACL  05/2008   Left knee   CARPAL TUNNEL RELEASE Right 07/14/2017   Procedure: CARPAL TUNNEL RELEASE;  Surgeon: Margrette Taft BRAVO, MD;  Location: [facility];  Service: Orthopedics;  Laterality: Right;   CATARACT EXTRACTION, BILATERAL  2020   CHOLECYSTECTOMY     DILATION AND CURETTAGE OF UTERUS     DORSAL COMPARTMENT RELEASE Right 07/14/2017   Procedure: RELEASE DORSAL COMPARTMENT (DEQUERVAIN);  Surgeon: Margrette Taft BRAVO, MD;  Location: [facility];  Service: Orthopedics;  Laterality: Right;   HIATAL HERNIA REPAIR N/A 03/24/2020   Procedure: LAPAROSCOPIC REPAIR OF HIATAL HERNIA,  nissen fundoplication;  Surgeon: Gladis Cough, MD;  Location: [facility];  Service: General;  Laterality: N/A;   HYSTEROSCOPY WITH D & C N/A 12/25/2013   Procedure: DILATATION AND CURETTAGE /HYSTEROSCOPY;  Surgeon: Peggye Gull, MD;  Location: [facility];  Service: Gynecology;  Laterality: N/A;   SHOULDER OPEN ROTATOR CUFF REPAIR Left 06/15/2018   Procedure: ROTATOR CUFF REPAIR SHOULDER OPEN WITH ACROMIOPLASTY;  Surgeon: Margrette Taft BRAVO, MD;  Location: [facility];  Service: Orthopedics;  Laterality: Left;   TUBAL LIGATION      Social History   Socioeconomic History   Marital status: Widowed    Spouse name: Not on file   Number of children: 4   Years of education: Not on file   Highest education level: Not on file  Occupational History   Occupation: retired    Associate Professor: OTHER  Tobacco Use   Smoking status: Never   Smokeless tobacco: Never  Vaping Use   Vaping status: Never Used  Substance and Sexual Activity   Alcohol use: Not Currently   Drug use: No   Sexual activity: Not Currently  Other Topics Concern   Not on file  Social History Narrative   Are you right handed or left handed? Right    Are you currently employed ? no   What is your current occupation?   Do you live at home alone?  No    Who lives with you? daughter   What type of home do you live in: 1 story or 2 story? 1 story        Social Drivers of Corporate investment banker Strain: Low Risk  (03/14/2024)   Received from [facility]   Overall Financial Resource Strain (CARDIA)    How hard is it for you to pay for the very basics like food, housing, medical care, and heating?: Not very hard  Food Insecurity: No Food Insecurity (03/14/2024)   Received from [facility]   Hunger Vital Sign    Within the past 12 months, you worried that your food would run out before you got the money to buy more.: Never true    Within the past 12 months, the food you bought just didn't last and you didn't have money to get more.: Never  true  Transportation Needs: Unmet Transportation Needs (03/14/2024)   Received from [facility] - Transportation    In the past 12 months, has lack of transportation kept you from medical appointments or from getting medications?: Yes    In the past 12 months, has lack of transportation kept you from meetings, work, or from getting things needed for daily living?: Yes  Physical Activity: Inactive (03/14/2024)   Received from [facility]   Exercise Vital Sign    On average, how many days per week do you engage in moderate to strenuous exercise (like a brisk walk)?: 0 days    Minutes of Exercise per Session: Not on file  Stress: No Stress Concern Present (03/14/2024)   Received from [facility] - Occupational Stress Questionnaire    Do you feel stress - tense, restless, nervous, or anxious, or unable to sleep at night because your mind is troubled all the time - these days?: Not at all  Social Connections: Socially Isolated (03/14/2024)   Received from [facility]   Social Network    How would you rate your social network (family, work, friends)?: Little participation, lonely and socially isolated  Intimate Partner Violence: Not At Risk (03/14/2024)   Received from [facility]   HITS    Over the last 12 months how often did your partner physically hurt you?: Never    Over the last 12 months how often did your partner insult you or talk down to you?: Never    Over the last 12 months how often did your partner threaten you with physical harm?: Never    Over the last 12 months how often did your partner scream or curse at you?: Never   Family History  Problem Relation Age of Onset   Heart failure Mother 42   Lung cancer Father 82   Breast cancer Sister    Diabetes Sister    Colon cancer Brother    Colon polyps Son    Colon polyps Daughter       VITAL SIGNS BP 110/60   Pulse 84   Temp 98 F (36.7 C)   Resp 18   Ht 5' 1  (1.549 m)   Wt 189 lb 12.8 oz (86.1 kg)   SpO2 95%   BMI 35.86 kg/m   Outpatient Encounter Medications as of 03/15/2024  Medication Sig Note   apixaban  (ELIQUIS ) 5 MG TABS tablet Take 1 tablet (5 mg total) by mouth 2 (two) times daily.    ascorbic acid  (VITAMIN C ) 500 MG tablet Take  1 tablet (500 mg total) by mouth daily.    bisacodyl  (DULCOLAX) 5 MG EC tablet Take 1 tablet (5 mg total) by mouth daily as needed for moderate constipation.    conjugated estrogens  (PREMARIN ) 0.625 MG/GM vaginal cream Place 0.25 Applicatorfuls vaginally daily.    divalproex  (DEPAKOTE  SPRINKLE) 125 MG capsule Take 2 capsules (250 mg total) by mouth at bedtime.    divalproex  (DEPAKOTE  SPRINKLE) 125 MG capsule Take 1 capsule (125 mg total) by mouth every morning.    escitalopram  (LEXAPRO ) 20 MG tablet Take 1 tablet (20 mg total) by mouth at bedtime.    folic acid  (FOLVITE ) 1 MG tablet Take 1 mg by mouth daily.    melatonin 5 MG TABS Take 5 mg by mouth at bedtime.    metoprolol  tartrate (LOPRESSOR ) 25 MG tablet Take 1 tablet (25 mg total) by mouth 2 (two) times daily.    nitrofurantoin , macrocrystal-monohydrate, (MACROBID ) 100 MG capsule Take 1 capsule (100 mg total) by mouth at bedtime.    nystatin  ointment (MYCOSTATIN ) Apply 1 Application topically 2 (two) times daily as needed.    omeprazole  (PRILOSEC) 40 MG capsule Take 1 capsule (40 mg total) by mouth daily.    Oxcarbazepine  (TRILEPTAL ) 300 MG tablet Take 1 tablet (300 mg total) by mouth 2 (two) times daily.    potassium chloride  SA (KLOR-CON  M) 20 MEQ tablet Take 1 tablet (20 mEq total) by mouth daily.    simvastatin  (ZOCOR ) 40 MG tablet Take 1 tablet (40 mg total) by mouth at bedtime.    vitamin B-12 (CYANOCOBALAMIN ) 500 MCG tablet Take 500 mcg by mouth daily.    Vitamin D , Ergocalciferol , (DRISDOL ) 1.25 MG (50000 UNIT) CAPS capsule Take 1 capsule (50,000 Units total) by mouth once a week.    divalproex  (DEPAKOTE  SPRINKLE) 125 MG capsule Take 2 capsules (250  mg total) by mouth 2 (two) times daily. (Patient not taking: Reported on 03/15/2024)    nystatin  cream (MYCOSTATIN ) Apply 1 Application topically 2 (two) times daily. (Patient not taking: Reported on 03/15/2024)    nystatin  powder Apply 1 Application topically 2 (two) times daily as needed. (Patient not taking: Reported on 03/15/2024) 02/27/2024: Pt uses the cream instead of the powder   Facility-Administered Encounter Medications as of 03/15/2024  Medication   fentaNYL  (SUBLIMAZE ) injection 25-50 mcg     SIGNIFICANT DIAGNOSTIC EXAMS  Review of Systems  Constitutional:  Negative for malaise/fatigue.  Respiratory:  Negative for cough and shortness of breath.   Cardiovascular:  Negative for chest pain, palpitations and leg swelling.  Gastrointestinal:  Negative for abdominal pain, constipation and heartburn.  Musculoskeletal:  Negative for back pain, joint pain and myalgias.  Skin: Negative.   Neurological:  Negative for dizziness.  Psychiatric/Behavioral:  The patient is not nervous/anxious.     Physical Exam Constitutional:      General: She is not in acute distress.    Appearance: She is well-developed. She is obese. She is not diaphoretic.  Neck:     Thyroid : No thyromegaly.  Cardiovascular:     Rate and Rhythm: Normal rate and regular rhythm.     Heart sounds: Normal heart sounds.  Pulmonary:     Effort: Pulmonary effort is normal. No respiratory distress.     Breath sounds: Normal breath sounds.  Abdominal:     General: Bowel sounds are normal. There is no distension.     Palpations: Abdomen is soft.     Tenderness: There is no abdominal tenderness.  Musculoskeletal:  General: Normal range of motion.     Cervical back: Neck supple.     Right lower leg: No edema.     Left lower leg: No edema.  Lymphadenopathy:     Cervical: No cervical adenopathy.  Skin:    General: Skin is warm and dry.  Neurological:     Mental Status: She is alert. Mental status is at baseline.   Psychiatric:        Mood and Affect: Mood normal.       ASSESSMENT/ PLAN:   Patient is being discharged with the following home health services:  pt/ot to evaluate and treat as indicated for gait balance strength adl training.   Patient is being discharged with the following durable medical equipment:  no dme   Patient has been advised to f/u with their PCP in 1-2 weeks to for a transitions of care visit.  Social services at their facility was responsible for arranging this appointment.  Pt was provided with adequate prescriptions of noncontrolled medications to reach the scheduled appointment .  For controlled substances, a limited supply was provided as appropriate for the individual patient.  If the pt normally receives these medications from a pain clinic or has a contract with another physician, these medications should be received from that clinic or physician only).    A 30 day supply of her prescription medications have been sent to [facility] in [place]  Time spent with patient 35 minutes: medications; home health; dme.   Barnie Seip NP [facility]  call [phone]

## 2024-03-16 ENCOUNTER — Emergency Department (HOSPITAL_COMMUNITY)

## 2024-03-16 ENCOUNTER — Observation Stay (HOSPITAL_COMMUNITY)
Admission: EM | Admit: 2024-03-16 | Discharge: 2024-03-19 | Disposition: A | Discharge status: Home / Self Care | Attending: Internal Medicine | Admitting: Internal Medicine

## 2024-03-16 ENCOUNTER — Encounter (HOSPITAL_COMMUNITY): Payer: Self-pay

## 2024-03-16 ENCOUNTER — Other Ambulatory Visit: Payer: Self-pay

## 2024-03-16 DIAGNOSIS — E782 Mixed hyperlipidemia: Secondary | ICD-10-CM | POA: Diagnosis not present

## 2024-03-16 DIAGNOSIS — M6281 Muscle weakness (generalized): Secondary | ICD-10-CM | POA: Insufficient documentation

## 2024-03-16 DIAGNOSIS — F02811 Dementia in other diseases classified elsewhere, unspecified severity, with agitation: Secondary | ICD-10-CM | POA: Diagnosis not present

## 2024-03-16 DIAGNOSIS — Z7901 Long term (current) use of anticoagulants: Secondary | ICD-10-CM | POA: Diagnosis not present

## 2024-03-16 DIAGNOSIS — I10 Essential (primary) hypertension: Secondary | ICD-10-CM | POA: Diagnosis present

## 2024-03-16 DIAGNOSIS — R531 Weakness: Secondary | ICD-10-CM | POA: Insufficient documentation

## 2024-03-16 DIAGNOSIS — F0390 Unspecified dementia without behavioral disturbance: Secondary | ICD-10-CM | POA: Diagnosis present

## 2024-03-16 DIAGNOSIS — R569 Unspecified convulsions: Principal | ICD-10-CM

## 2024-03-16 DIAGNOSIS — G40919 Epilepsy, unspecified, intractable, without status epilepticus: Secondary | ICD-10-CM | POA: Diagnosis present

## 2024-03-16 DIAGNOSIS — R1310 Dysphagia, unspecified: Secondary | ICD-10-CM | POA: Insufficient documentation

## 2024-03-16 DIAGNOSIS — Z79899 Other long term (current) drug therapy: Secondary | ICD-10-CM | POA: Insufficient documentation

## 2024-03-16 DIAGNOSIS — E66812 Obesity, class 2: Secondary | ICD-10-CM | POA: Insufficient documentation

## 2024-03-16 DIAGNOSIS — I480 Paroxysmal atrial fibrillation: Secondary | ICD-10-CM | POA: Diagnosis present

## 2024-03-16 DIAGNOSIS — F0150 Vascular dementia without behavioral disturbance: Secondary | ICD-10-CM | POA: Diagnosis not present

## 2024-03-16 DIAGNOSIS — G4089 Other seizures: Secondary | ICD-10-CM | POA: Diagnosis present

## 2024-03-16 DIAGNOSIS — Z6836 Body mass index (BMI) 36.0-36.9, adult: Secondary | ICD-10-CM | POA: Diagnosis not present

## 2024-03-16 DIAGNOSIS — I700 Atherosclerosis of aorta: Secondary | ICD-10-CM | POA: Insufficient documentation

## 2024-03-16 LAB — CBC WITH DIFFERENTIAL/PLATELET
Abs Immature Granulocytes: 0.03 K/uL (ref 0.00–0.07)
Basophils Absolute: 0 K/uL (ref 0.0–0.1)
Basophils Relative: 1 %
Eosinophils Absolute: 0 K/uL (ref 0.0–0.5)
Eosinophils Relative: 0 %
HCT: 43.6 % (ref 36.0–46.0)
Hemoglobin: 14.4 g/dL (ref 12.0–15.0)
Immature Granulocytes: 0 %
Lymphocytes Relative: 20 %
Lymphs Abs: 1.5 K/uL (ref 0.7–4.0)
MCH: 30.6 pg (ref 26.0–34.0)
MCHC: 33 g/dL (ref 30.0–36.0)
MCV: 92.8 fL (ref 80.0–100.0)
Monocytes Absolute: 0.8 K/uL (ref 0.1–1.0)
Monocytes Relative: 10 %
Neutro Abs: 5.3 K/uL (ref 1.7–7.7)
Neutrophils Relative %: 69 %
Platelets: 301 K/uL (ref 150–400)
RBC: 4.7 MIL/uL (ref 3.87–5.11)
RDW: 13.2 % (ref 11.5–15.5)
WBC: 7.7 K/uL (ref 4.0–10.5)
nRBC: 0 % (ref 0.0–0.2)

## 2024-03-16 LAB — CBG MONITORING, ED: Glucose-Capillary: 103 mg/dL — ABNORMAL HIGH (ref 70–99)

## 2024-03-16 NOTE — ED Triage Notes (Signed)
 [facility] from home. Cc of seizure. Was dc for seizure today.  Had a seizure with family; they immediately called 911. Patient starred off to the left post ictal upon arrival with snoring respirations. Back to baseline now- has dementia.

## 2024-03-16 NOTE — ED Provider Notes (Signed)
 [place] EMERGENCY DEPARTMENT AT [facility] Provider Note  CSN: [id number] Arrival date & time: 03/16/24 2015  Chief Complaint(s) Seizures  HPI Donna Barron is a 83 y.o. female with past medical history as below, significant for HLD, vascular dementia, seizure, obesity who presents to the ED with complaint of seizure  She was recently brought home from SNF, had a seizure at home today that was similar to pt's prior seizures. Pt has not returned back to baseline from the seizure. Last seizure prior to this was around 1 wk ago per daughter. Daughter reports pt has reduced responsiveness compared to baseline and is not making typical eye contact.   Denies any fever, chills, dib, cp, n/v, she is compliant with her medications as far as  family is aware but she was recently at [facility]  Past Medical History Past Medical History:  Diagnosis Date   Anxiety    Cataract    Colon polyps    Dementia (HCC)    Depression    Dysrhythmia    heart palipations   Gallstones    GERD (gastroesophageal reflux disease)    HTN (hypertension)    Hypercholesteremia    PONV (postoperative nausea and vomiting)    Patient Active Problem List   Diagnosis Date Noted   Goals of care, counseling/discussion 03/03/2024   Sepsis secondary to UTI (HCC) 02/26/2024   At risk for central sleep apnea 01/17/2024   Encephalopathy acute 01/14/2024   Seizure disorder (HCC) 01/14/2024   Sepsis due to gram-negative UTI (HCC) 12/12/2023   Bilateral lower extremity edema 11/15/2023   Paroxysmal atrial fibrillation (HCC) 11/02/2023   Acute metabolic encephalopathy 10/13/2023   Memory loss 09/24/2021   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, without behavioral disturbance (HCC) 09/24/2021   Status post Nissen fundoplication Oct 2021 03/24/2020   Status post laparoscopic Nissen fundoplication 03/24/2020   Overactive bladder 12/04/2019   Dysphagia 09/11/2019   S/P left rotator  cuff repair 06/15/18 06/15/2018   Nontraumatic complete tear of left rotator cuff    Obesity, class 1 05/23/2018   S/P carpal tunnel and DeQuervains release right 07/14/17 07/20/2017   Carpal tunnel syndrome of right wrist    De Quervain's disease (tenosynovitis)    Essential hypertension 11/15/2013   Palpitations 06/28/2013   Mixed hyperlipidemia 06/28/2013   Obesity, unspecified 06/28/2013   Home Medication(s) Prior to Admission medications   Medication Sig Start Date End Date Taking? Authorizing Provider  apixaban  (ELIQUIS ) 5 MG TABS tablet Take 1 tablet (5 mg total) by mouth 2 (two) times daily. 03/15/24   [name], NP  ascorbic acid  (VITAMIN C ) 500 MG tablet Take 1 tablet (500 mg total) by mouth daily. 03/05/24   Evonnie Lenis, MD  bisacodyl  (DULCOLAX) 5 MG EC tablet Take 1 tablet (5 mg total) by mouth daily as needed for moderate constipation. 10/17/23 10/16/24  Maree, Pratik D, DO  conjugated estrogens  (PREMARIN ) 0.625 MG/GM vaginal cream Place 0.25 Applicatorfuls vaginally daily. 01/17/24 01/16/25  Maree, Pratik D, DO  divalproex  (DEPAKOTE  SPRINKLE) 125 MG capsule Take 2 capsules (250 mg total) by mouth at bedtime. 03/15/24   [name], NP  divalproex  (DEPAKOTE  SPRINKLE) 125 MG capsule Take 1 capsule (125 mg total) by mouth every morning. 03/15/24   [name], NP  escitalopram  (LEXAPRO ) 20 MG tablet Take 1 tablet (20 mg total) by mouth at bedtime. 03/15/24   [name], NP  folic acid  (FOLVITE ) 1 MG tablet Take 1  tablet (1 mg total) by mouth daily. 03/15/24   [name], NP  melatonin 5 MG TABS Take 5 mg by mouth at bedtime.    [provider]  metoprolol  tartrate (LOPRESSOR ) 25 MG tablet Take 1 tablet (25 mg total) by mouth 2 (two) times daily. 03/15/24   [name], NP  nitrofurantoin , macrocrystal-monohydrate, (MACROBID ) 100 MG capsule Take 1 capsule (100 mg total) by mouth at bedtime. 03/15/24   [name], NP  nystatin  ointment (MYCOSTATIN ) Apply 1  Application topically 2 (two) times daily as needed. 03/15/24   [name], NP  omeprazole  (PRILOSEC) 40 MG capsule Take 1 capsule (40 mg total) by mouth daily. 03/15/24   [name], NP  Oxcarbazepine  (TRILEPTAL ) 300 MG tablet Take 1 tablet (300 mg total) by mouth 2 (two) times daily. 03/15/24   [name], NP  potassium chloride  SA (KLOR-CON  M) 20 MEQ tablet Take 1 tablet (20 mEq total) by mouth daily. 03/15/24   [name], NP  simvastatin  (ZOCOR ) 40 MG tablet Take 1 tablet (40 mg total) by mouth at bedtime. 03/15/24   [name], NP  vitamin B-12 (CYANOCOBALAMIN ) 500 MCG tablet Take 500 mcg by mouth daily.    [provider]  Vitamin D , Ergocalciferol , (DRISDOL ) 1.25 MG (50000 UNIT) CAPS capsule Take 1 capsule (50,000 Units total) by mouth once a week. 03/15/24   [name], NP                                                                                                                                    Past Surgical History Past Surgical History:  Procedure Laterality Date   APPENDECTOMY     ARTHROSCOPIC REPAIR ACL  05/2008   Left knee   CARPAL TUNNEL RELEASE Right 07/14/2017   Procedure: CARPAL TUNNEL RELEASE;  Surgeon: Margrette Taft BRAVO, MD;  Location: [facility];  Service: Orthopedics;  Laterality: Right;   CATARACT EXTRACTION, BILATERAL  2020   CHOLECYSTECTOMY     DILATION AND CURETTAGE OF UTERUS     DORSAL COMPARTMENT RELEASE Right 07/14/2017   Procedure: RELEASE DORSAL COMPARTMENT (DEQUERVAIN);  Surgeon: Margrette Taft BRAVO, MD;  Location: [facility];  Service: Orthopedics;  Laterality: Right;   HIATAL HERNIA REPAIR N/A 03/24/2020   Procedure: LAPAROSCOPIC REPAIR OF HIATAL HERNIA, nissen fundoplication;  Surgeon: Gladis Cough, MD;  Location: [facility];  Service: General;  Laterality: N/A;   HYSTEROSCOPY WITH D & C N/A 12/25/2013   Procedure: DILATATION AND CURETTAGE /HYSTEROSCOPY;  Surgeon: Peggye Gull, MD;  Location: [facility];  Service: Gynecology;   Laterality: N/A;   SHOULDER OPEN ROTATOR CUFF REPAIR Left 06/15/2018   Procedure: ROTATOR CUFF REPAIR SHOULDER OPEN WITH ACROMIOPLASTY;  Surgeon: Margrette Taft BRAVO, MD;  Location: [facility];  Service: Orthopedics;  Laterality: Left;   TUBAL LIGATION     Family History Family History  Problem Relation Age of  Onset   Heart failure Mother 42   Lung cancer Father 60   Breast cancer Sister    Diabetes Sister    Colon cancer Brother    Colon polyps Son    Colon polyps Daughter     Social History Social History   Tobacco Use   Smoking status: Never   Smokeless tobacco: Never  Vaping Use   Vaping status: Never Used  Substance Use Topics   Alcohol use: Not Currently   Drug use: No   Allergies Codeine , Hydrocodone , Avelox [moxifloxacin hcl in nacl], and Oxybutynin  Review of Systems A thorough review of systems was obtained and all systems are negative except as noted in the HPI and PMH.   Physical Exam Vital Signs  I have reviewed the triage vital signs BP (!) 121/49   Pulse 77   Resp 20   Ht 5' 1 (1.549 m)   Wt 86.1 kg   SpO2 93%   BMI 35.87 kg/m  Physical Exam Vitals and nursing note reviewed.  Constitutional:      General: She is not in acute distress.    Appearance: Normal appearance. She is well-developed. She is not ill-appearing.  HENT:     Head: Normocephalic and atraumatic.     Right Ear: External ear normal.     Left Ear: External ear normal.     Nose: Nose normal.     Mouth/Throat:     Mouth: Mucous membranes are moist.  Eyes:     General: No scleral icterus.       Right eye: No discharge.        Left eye: No discharge.     Extraocular Movements: Extraocular movements intact.     Pupils: Pupils are equal, round, and reactive to light.  Cardiovascular:     Rate and Rhythm: Normal rate.  Pulmonary:     Effort: Pulmonary effort is normal. No respiratory distress.     Breath sounds: No stridor.  Abdominal:     General: Abdomen is flat. There is no  distension.     Tenderness: There is no guarding.  Musculoskeletal:        General: No deformity.     Cervical back: No rigidity.  Skin:    General: Skin is warm and dry.     Coloration: Skin is not cyanotic, jaundiced or pale.  Neurological:     Mental Status: She is alert.     GCS: GCS eye subscore is 4. GCS verbal subscore is 5. GCS motor subscore is 6.     Cranial Nerves: Cranial nerves 2-12 are intact. No facial asymmetry.     Sensory: Sensation is intact. No sensory deficit.     Motor: Motor function is intact. No tremor.     Coordination: Coordination abnormal.     Comments: Patient is somewhat withdrawn, she will answer questions appropriately  Gait testing deferred secondary to patient safety. Strength 5/5 to BLUE/BLLE, equal and symmetric    Psychiatric:        Speech: Speech normal.        Behavior: Behavior normal. Behavior is cooperative.     ED Results and Treatments Labs (all labs ordered are listed, but only abnormal results are displayed) Labs Reviewed  VALPROIC  ACID LEVEL - Abnormal; Notable for the following components:      Result Value   Valproic  Acid Lvl 22 (*)    All other components within normal limits  CBG MONITORING, ED - Abnormal; Notable for  the following components:   Glucose-Capillary 103 (*)    All other components within normal limits  CBC WITH DIFFERENTIAL/PLATELET  URINALYSIS, ROUTINE W REFLEX MICROSCOPIC  CK  COMPREHENSIVE METABOLIC PANEL WITH GFR                                                                                                                          Radiology DG Chest Portable 1 View Result Date: 03/16/2024 CLINICAL DATA:  Status post seizure. EXAM: PORTABLE CHEST 1 VIEW COMPARISON:  February 26, 2024 FINDINGS: The heart size and mediastinal contours are within normal limits. Low lung volumes are noted with subsequent crowding of the bronchovascular lung markings. No acute infiltrate, pleural effusion or pneumothorax  is identified. Radiopaque surgical clips are seen overlying the right upper quadrant. The visualized skeletal structures are unremarkable. IMPRESSION: Low lung volumes without acute or active cardiopulmonary disease. Electronically Signed   By: Suzen Dials M.D.   On: 03/16/2024 23:49    Pertinent labs & imaging results that were available during my care of the patient were reviewed by me and considered in my medical decision making (see MDM for details).  Medications Ordered in ED Medications - No data to display                                                                                                                                   Procedures Procedures  (including critical care time)  Medical Decision Making / ED Course    Medical Decision Making:    TYSON MASIN is a 83 y.o. female with past medical history as below, significant for HLD, vascular dementia, seizure, obesity who presents to the ED with complaint of seizure. The complaint involves an extensive differential diagnosis and also carries with it a high risk of complications and morbidity.  Serious etiology was considered. Ddx includes but is not limited to: Breakthrough seizure, medication effect, infection, metabolic derangement, dementia, etc.  Complete initial physical exam performed, notably the patient was in no acute distress.    Reviewed and confirmed nursing documentation for past medical history, family history, social history.  Vital signs reviewed.    Breakthrough seizure> - Patient recently was discharged from rehab facility to home.  Had seizure despite apparent compliance with antiepileptics - Seizure similar to prior per patient's family at bedside but she has not returned to baseline since then per family -  She is having some difficulty with coordination testing but otherwise her neuroexam is intact. - Workup so far is reassuring, Depakote  level is pending. - CT head and neck ordered and is  pending. - Handoff to Dr. Theadore pending remainder of labs and recheck, final disposition.  Dissipate admission if she does not return to baseline                      Additional history obtained: -Additional history obtained from family -External records from outside source obtained and reviewed including: Chart review including previous notes, labs, imaging, consultation notes including  Home medications, prior labs   Lab Tests: -I ordered, reviewed, and interpreted labs.   The pertinent results include:   Labs Reviewed  VALPROIC  ACID LEVEL - Abnormal; Notable for the following components:      Result Value   Valproic  Acid Lvl 22 (*)    All other components within normal limits  CBG MONITORING, ED - Abnormal; Notable for the following components:   Glucose-Capillary 103 (*)    All other components within normal limits  CBC WITH DIFFERENTIAL/PLATELET  URINALYSIS, ROUTINE W REFLEX MICROSCOPIC  CK  COMPREHENSIVE METABOLIC PANEL WITH GFR    Notable for Depakote  level is low EKG   EKG Interpretation Date/Time:    Ventricular Rate:    PR Interval:    QRS Duration:    QT Interval:    QTC Calculation:   R Axis:      Text Interpretation:           Imaging Studies ordered: I ordered imaging studies including CTA head and neck> pending    Medicines ordered and prescription drug management: No orders of the defined types were placed in this encounter.   -I have reviewed the patients home medicines and have made adjustments as needed   Consultations Obtained: na   Cardiac Monitoring: The patient was maintained on a cardiac monitor.  I personally viewed and interpreted the cardiac monitored which showed an underlying rhythm of: nsr Continuous pulse oximetry interpreted by myself, 95% on RA.    Social Determinants of Health:  Diagnosis or treatment significantly limited by social determinants of health: obesity   Reevaluation: After the  interventions noted above, I reevaluated the patient and found that they have stayed the same  Co morbidities that complicate the patient evaluation  Past Medical History:  Diagnosis Date   Anxiety    Cataract    Colon polyps    Dementia (HCC)    Depression    Dysrhythmia    heart palipations   Gallstones    GERD (gastroesophageal reflux disease)    HTN (hypertension)    Hypercholesteremia    PONV (postoperative nausea and vomiting)       Dispostion: Disposition decision including need for hospitalization was considered, and patient disposition pending at time of sign out.    Final Clinical Impression(s) / ED Diagnoses Final diagnoses:  Post-ictal state (HCC)        Elnor Jayson LABOR, DO 03/17/24 1539

## 2024-03-16 NOTE — ED Notes (Signed)
 20g R FA placed by EMS

## 2024-03-17 ENCOUNTER — Emergency Department (HOSPITAL_COMMUNITY)

## 2024-03-17 DIAGNOSIS — R531 Weakness: Secondary | ICD-10-CM | POA: Diagnosis not present

## 2024-03-17 DIAGNOSIS — F0390 Unspecified dementia without behavioral disturbance: Secondary | ICD-10-CM

## 2024-03-17 DIAGNOSIS — I480 Paroxysmal atrial fibrillation: Secondary | ICD-10-CM | POA: Diagnosis not present

## 2024-03-17 DIAGNOSIS — E782 Mixed hyperlipidemia: Secondary | ICD-10-CM

## 2024-03-17 DIAGNOSIS — G40919 Epilepsy, unspecified, intractable, without status epilepticus: Secondary | ICD-10-CM | POA: Diagnosis not present

## 2024-03-17 DIAGNOSIS — I10 Essential (primary) hypertension: Secondary | ICD-10-CM

## 2024-03-17 DIAGNOSIS — E66812 Obesity, class 2: Secondary | ICD-10-CM | POA: Insufficient documentation

## 2024-03-17 DIAGNOSIS — E66813 Obesity, class 3: Secondary | ICD-10-CM

## 2024-03-17 DIAGNOSIS — G4089 Other seizures: Secondary | ICD-10-CM | POA: Diagnosis not present

## 2024-03-17 LAB — URINALYSIS, ROUTINE W REFLEX MICROSCOPIC
Bilirubin Urine: NEGATIVE
Glucose, UA: NEGATIVE mg/dL
Hgb urine dipstick: NEGATIVE
Ketones, ur: 20 mg/dL — AB
Leukocytes,Ua: NEGATIVE
Nitrite: NEGATIVE
Protein, ur: NEGATIVE mg/dL
Specific Gravity, Urine: 1.032 — ABNORMAL HIGH (ref 1.005–1.030)
pH: 7 (ref 5.0–8.0)

## 2024-03-17 LAB — COMPREHENSIVE METABOLIC PANEL WITH GFR
ALT: 10 U/L (ref 0–44)
AST: 29 U/L (ref 15–41)
Albumin: 3.5 g/dL (ref 3.5–5.0)
Alkaline Phosphatase: 77 U/L (ref 38–126)
Anion gap: 19 — ABNORMAL HIGH (ref 5–15)
BUN: 8 mg/dL (ref 8–23)
CO2: 21 mmol/L — ABNORMAL LOW (ref 22–32)
Calcium: 9.6 mg/dL (ref 8.9–10.3)
Chloride: 96 mmol/L — ABNORMAL LOW (ref 98–111)
Creatinine, Ser: 0.58 mg/dL (ref 0.44–1.00)
GFR, Estimated: >60 mL/min (ref >60)
Glucose, Bld: 99 mg/dL (ref 70–99)
Potassium: 4.3 mmol/L (ref 3.5–5.1)
Sodium: 136 mmol/L (ref 135–145)
Total Bilirubin: 0.3 mg/dL (ref 0.0–1.2)
Total Protein: 6.5 g/dL (ref 6.5–8.1)

## 2024-03-17 LAB — VALPROIC ACID LEVEL: Valproic Acid Lvl: 22 ug/mL — ABNORMAL LOW (ref 50–100)

## 2024-03-17 LAB — CK: Total CK: 40 U/L (ref 38–234)

## 2024-03-17 LAB — MAGNESIUM: Magnesium: 1.9 mg/dL (ref 1.7–2.4)

## 2024-03-17 LAB — PHOSPHORUS: Phosphorus: 3.1 mg/dL (ref 2.5–4.6)

## 2024-03-17 MED ORDER — SIMVASTATIN 20 MG PO TABS
40.0000 mg | ORAL_TABLET | Freq: Every day | ORAL | Status: DC
  Administered 2024-03-17 – 2024-03-18 (×2): 40 mg via ORAL
  Filled 2024-03-17 (×2): qty 2

## 2024-03-17 MED ORDER — ESCITALOPRAM OXALATE 10 MG PO TABS
20.0000 mg | ORAL_TABLET | Freq: Every day | ORAL | Status: DC
  Administered 2024-03-17 – 2024-03-18 (×2): 20 mg via ORAL
  Filled 2024-03-17 (×2): qty 2

## 2024-03-17 MED ORDER — DIVALPROEX SODIUM 125 MG PO CSDR
250.0000 mg | DELAYED_RELEASE_CAPSULE | Freq: Every day | ORAL | Status: DC
  Administered 2024-03-17 – 2024-03-18 (×2): 250 mg via ORAL
  Filled 2024-03-17 (×2): qty 2

## 2024-03-17 MED ORDER — LACTATED RINGERS IV SOLN: INTRAVENOUS | Status: AC

## 2024-03-17 MED ORDER — ACETAMINOPHEN 650 MG RE SUPP: 650.0000 mg | Freq: Four times a day (QID) | RECTAL | Status: DC | PRN

## 2024-03-17 MED ORDER — LEVETIRACETAM 500 MG PO TABS
500.0000 mg | ORAL_TABLET | Freq: Two times a day (BID) | ORAL | Status: DC
  Administered 2024-03-17 – 2024-03-19 (×5): 500 mg via ORAL
  Filled 2024-03-17 (×4): qty 1

## 2024-03-17 MED ORDER — PANTOPRAZOLE SODIUM 40 MG PO TBEC
80.0000 mg | DELAYED_RELEASE_TABLET | Freq: Every day | ORAL | Status: DC
  Administered 2024-03-17 – 2024-03-19 (×3): 80 mg via ORAL
  Filled 2024-03-17 (×3): qty 2

## 2024-03-17 MED ORDER — ONDANSETRON HCL 4 MG/2ML IJ SOLN: 4.0000 mg | Freq: Four times a day (QID) | INTRAMUSCULAR | Status: DC | PRN

## 2024-03-17 MED ORDER — APIXABAN 5 MG PO TABS
5.0000 mg | ORAL_TABLET | Freq: Two times a day (BID) | ORAL | Status: DC
Start: 2024-03-17 — End: 2024-03-19
  Administered 2024-03-17 – 2024-03-19 (×5): 5 mg via ORAL
  Filled 2024-03-17 (×5): qty 1

## 2024-03-17 MED ORDER — ONDANSETRON HCL 4 MG PO TABS: 4.0000 mg | ORAL_TABLET | Freq: Four times a day (QID) | ORAL | Status: DC | PRN

## 2024-03-17 MED ORDER — LACTATED RINGERS IV BOLUS
500.0000 mL | Freq: Once | INTRAVENOUS | Status: AC
  Administered 2024-03-17: 500 mL via INTRAVENOUS

## 2024-03-17 MED ORDER — LEVETIRACETAM (KEPPRA) 500 MG/5 ML ADULT IV PUSH
1000.0000 mg | Freq: Once | INTRAVENOUS | Status: AC
  Administered 2024-03-17: 1000 mg via INTRAVENOUS
  Filled 2024-03-17: qty 10

## 2024-03-17 MED ORDER — DIVALPROEX SODIUM 125 MG PO CSDR
125.0000 mg | DELAYED_RELEASE_CAPSULE | Freq: Every morning | ORAL | Status: DC
  Administered 2024-03-17 – 2024-03-19 (×3): 125 mg via ORAL
  Filled 2024-03-17 (×3): qty 1

## 2024-03-17 MED ORDER — ACETAMINOPHEN 325 MG PO TABS: 650.0000 mg | ORAL_TABLET | Freq: Four times a day (QID) | ORAL | Status: DC | PRN

## 2024-03-17 MED ORDER — OXCARBAZEPINE 300 MG PO TABS
300.0000 mg | ORAL_TABLET | Freq: Two times a day (BID) | ORAL | Status: DC
Start: 2024-03-17 — End: 2024-03-19
  Administered 2024-03-17 – 2024-03-19 (×5): 300 mg via ORAL
  Filled 2024-03-17 (×5): qty 1

## 2024-03-17 MED ORDER — METOPROLOL TARTRATE 25 MG PO TABS
12.5000 mg | ORAL_TABLET | Freq: Two times a day (BID) | ORAL | Status: DC
  Administered 2024-03-17 – 2024-03-19 (×4): 12.5 mg via ORAL
  Filled 2024-03-17 (×5): qty 1

## 2024-03-17 MED ORDER — IOHEXOL 350 MG/ML SOLN
75.0000 mL | Freq: Once | INTRAVENOUS | Status: AC | PRN
  Administered 2024-03-17: 75 mL via INTRAVENOUS

## 2024-03-17 MED ORDER — LEVETIRACETAM 500 MG PO TABS
500.0000 mg | ORAL_TABLET | Freq: Two times a day (BID) | ORAL | Status: DC
  Filled 2024-03-17: qty 1

## 2024-03-17 NOTE — Consult Note (Signed)
 [facility] Consult Services  Stat Consult  Patient Name:   Donna Barron, Donna Barron Date of Birth:   [birth year] Identification Number:   MRN - [id number] Date of Service:   03/17/2024 02:47:05  Diagnosis:       G40.89 - Other seizures  Impression This is an 83 year old female with history of seizures and recent admission for UTI who presents to the hospital with breakthrough seizure today. This was a slightly atypical seizure and that she did not have any jerking, just had what looks to be some postictal-like breathing pattern. She has not returned to baseline and still is appearing postictal. CT head and CTA with no acute findings. In review of her chart, with her recent admission she had elevated levels of ammonia that was attributed to liver dysfunction in the setting of Depakote  use so her Depakote  dose was lowered to her current dose which is 250 mg at night and 125 mg in the morning. Consequently her Trileptal  was increased to 300 mg twice daily. During her recent admission, both her Depakote  and Trileptal  levels were subtherapeutic. At the current dose of Depakote  I am not sure that she is actually receiving any benefit as an antiseizure medication. On examination, she was unable to participate fully but was able to follow some commands. She had global weakness and was unable to name objects but could repeat. I would recommend admission for observation as well as medication titration. Given concerns about liver dysfunction with the Depakote , I would instead recommend starting her on Keppra 1000mg  now then 500mg  BID, continuing the Trileptal  and Depakote  at the current doses but plan to start Depakote  wean if Keppra is tolerated.   Recommendations: Our recommendations are outlined below.  Diagnostic Studies : Routine EEG  Laboratory Studies : Comprehensive Metabolic profile CBC with diff magnesium , phosphorus AED level with AM labs  Medications : Keppra 1g  now then 500mg  BID Continue trileptal  and depakote  at current doses  Nursing Recommendations : Maintain Euglycemia and Euthermia Neuro checks q1-2 hrs during ICU stay if critical Once stable neuro checks q 4hrs  DVT Prophylaxis : Choice of Primary Team  Disposition : Neurology will follow    ----------------------------------------------------------------------------------------------------    Metrics: Dispatch Time: 03/17/2024 02:45:53 Callback Response Time: 03/17/2024 02:47:43  Primary Provider Notified of Diagnostic Impression and Management Plan on: 03/17/2024 03:30:58    Imaging CTH and CTA IMPRESSION: 1. No acute intracranial abnormality or emergent large vessel occlusion. 2. Mild bilateral carotid bifurcation atherosclerosis, right greater than left, without hemodynamically significant stenosis. 3. Mild narrowing of the right vertebral artery origin and mild calcific atherosclerosis of the left vertebral artery V4 segment without stenosis.  Labs Reviewed   ----------------------------------------------------------------------------------------------------  Chief Complaint: seizures  History of Present Illness: Patient is a 83 year old Female. History provided by patient's daughter at bedside. Seizure activity started at 1900. She showed a video of patient with eyes open and rolled back breathing very heavily and sounding like she had secretions stuck in her throat. no shaking activity. She was not talking or following commands. She said with her seizures she will usually have jerking and then the heavy breathing afterwards. Today the breathing lasted about 3 minutes.  Seizures started last year. No inciting event. Daughter notices seizures more frequently with sleep deprivation. Recently admitted for 7 nights with a UTI and then was discharged to a nursing facility for 2 weeks. Just came home today from the nursing facility. She takes depakote  and trileptal   at home for seizures.  Currently taking Depakote  250mg  at night and 125mg  in the morning. Depakote  level was low at 22. Has consistently been subtherapeutic. She had an elevated ammonia during recently admission which they attributed to depakote  so the dose was decreased. Increased trileptal  300mg  BID    Past Medical History:      Hypertension      Hyperlipidemia      Atrial Fibrillation       Dementia/MCI Other PMH:  GERD  Medications:  Anticoagulant use:  Yes Eliquis  No Antiplatelet use Reviewed EMR for current medications  Allergies:  Reviewed  Social History: Smoking: No  Family History:  There is no family history of premature cerebrovascular disease pertinent to this consultation  ROS : 14 Points Review of Systems was performed and was negative except mentioned in HPI.  Past Surgical History: There Is No Surgical History Contributory To Today's Visit    Examination: BP(114/48), Pulse(85), 1A: Level of Consciousness - Alert; keenly responsive + 0 1B: Ask Month and Age - Could Not Answer Either Question Correctly + 2 1C: Blink Eyes & Squeeze Hands - Performs Both Tasks + 0 2: Test Horizontal Extraocular Movements - Normal + 0 3: Test Visual Fields - No Visual Loss + 0 4: Test Facial Palsy (Use Grimace if Obtunded) - Normal symmetry + 0 5A: Test Left Arm Motor Drift - Some Effort Against Gravity + 2 5B: Test Right Arm Motor Drift - Some Effort Against Gravity + 2 6A: Test Left Leg Motor Drift - No Effort Against Gravity + 3 6B: Test Right Leg Motor Drift - No Effort Against Gravity + 3 7: Test Limb Ataxia (FNF/Heel-Shin) - Does Not Understand + 0 8: Test Sensation - Normal; No sensory loss + 0 9: Test Language/Aphasia - Severe Aphasia: Fragmentary Expression, Inference Needed, Cannot Identify Materials + 2 10: Test Dysarthria - Normal + 0 11: Test Extinction/Inattention - No abnormality + 0  NIHSS Score: 52  Spoke with : Dr. Theadore    This consult was  conducted in real time using interactive audio and Immunologist. Patient was informed of the technology being used for this visit and agreed to proceed. Patient located in hospital and provider located at home/office setting.  Patient is being evaluated for possible acute neurologic impairment and high probability of imminent or life - threatening deterioration.I spent total of 35 minutes providing care to this patient, including time for face to face visit via telemedicine, review of medical records, imaging studies and discussion of findings with providers, the patient and / or family.   Dr Zelda Kluver     [facility] For Inpatient follow-up with [facility] physician please call [facility] at [phone]. As we are not an outpatient service for any post hospital discharge needs please contact the hospital for assistance.  If you have any questions for the [facility] physicians or need to reconsult for clinical or diagnostic changes please contact us  via [facility] at [phone].   Signature : [facility] Lacivita Burnett

## 2024-03-17 NOTE — Evaluation (Signed)
 Clinical/Bedside Swallow Evaluation Patient Details  Name: Donna Barron MRN: [id number] Date of Birth: 1940-08-07  Today's Date: 03/17/2024 Time: SLP Start Time (ACUTE ONLY): 1035 SLP Stop Time (ACUTE ONLY): 1050 SLP Time Calculation (min) (ACUTE ONLY): 15 min  Past Medical History:  Past Medical History:  Diagnosis Date   Anxiety    Cataract    Colon polyps    Dementia (HCC)    Depression    Dysrhythmia    heart palipations   Gallstones    GERD (gastroesophageal reflux disease)    HTN (hypertension)    Hypercholesteremia    PONV (postoperative nausea and vomiting)    Past Surgical History:  Past Surgical History:  Procedure Laterality Date   APPENDECTOMY     ARTHROSCOPIC REPAIR ACL  05/2008   Left knee   CARPAL TUNNEL RELEASE Right 07/14/2017   Procedure: CARPAL TUNNEL RELEASE;  Surgeon: Margrette Taft BRAVO, MD;  Location: [facility];  Service: Orthopedics;  Laterality: Right;   CATARACT EXTRACTION, BILATERAL  2020   CHOLECYSTECTOMY     DILATION AND CURETTAGE OF UTERUS     DORSAL COMPARTMENT RELEASE Right 07/14/2017   Procedure: RELEASE DORSAL COMPARTMENT (DEQUERVAIN);  Surgeon: Margrette Taft BRAVO, MD;  Location: [facility];  Service: Orthopedics;  Laterality: Right;   HIATAL HERNIA REPAIR N/A 03/24/2020   Procedure: LAPAROSCOPIC REPAIR OF HIATAL HERNIA, nissen fundoplication;  Surgeon: Gladis Cough, MD;  Location: [facility];  Service: General;  Laterality: N/A;   HYSTEROSCOPY WITH D & C N/A 12/25/2013   Procedure: DILATATION AND CURETTAGE /HYSTEROSCOPY;  Surgeon: Peggye Gull, MD;  Location: [facility];  Service: Gynecology;  Laterality: N/A;   SHOULDER OPEN ROTATOR CUFF REPAIR Left 06/15/2018   Procedure: ROTATOR CUFF REPAIR SHOULDER OPEN WITH ACROMIOPLASTY;  Surgeon: Margrette Taft BRAVO, MD;  Location: [facility];  Service: Orthopedics;  Laterality: Left;   TUBAL LIGATION     HPI:  Donna Barron is a 83 y.o. female with medical history significant of hypertension, hyperlipidemia,  paroxysmal atrial fibrillation, GERD, seizures, depression, obesity who presents to the emergency department from home via EMS due to complaint of seizures.  Patient was unable to provide history, history was obtained from daughter at bedside.  Per daughter, patient presents with seizure that looks like postictal like breathing pattern without jerking movements and patient has not returned to baseline and was still postictal after several hours.     Patient was recently admitted from 9/14 to 9/22 due to severe sepsis secondary to Pseudomonas UTI, gram-positive cocci bacteremia and hyperammonemia and was discharged to SNF.  Patient recently returned home from SNF and she presented with seizure today.  Last seizure prior to today seizure was about a week ago.     At baseline, patient has reduced responsiveness and has not been able to ambulate since she had sepsis due to UTI.     ST consulted for BSE to assess swallow function.    Assessment / Plan / Recommendation  Clinical Impression  Pt seen for clinical swallow evaluation with mod cues to rouse pt required initially d/t recent seizure medications given per nursing report affecting pt's alertness level.  Pt roused to sternal rub/mod verbal cues with oral care completion prior to po intake trial.  Pt consumed various consistencies of thin liquid, puree and soft solids with min verbal cues to initiate swallow due to mild oral retention.  Pt did not exhibit any overt s/s of aspiration throughout assessment despite oral holding/retention.  No pocketing noted and  pt appeared to exhibit adequate oral preparation/propulsion with all consistencies assessed.  Regular/thin liquid diet recommended with general swallow precautions in place.  Pt's alertness level impacts swallow function intermittently with mod verbal cues required and Full supervision/ total assistance needed when consuming all meals/snacks.  Medications may be beneficial either crushed or whole in puree  for safety.  No further ST needs required.  Thank you for this consult.  SLP Visit Diagnosis: Dysphagia, unspecified (R13.10)    Aspiration Risk  Mild aspiration risk    Diet Recommendation   Thin;Age appropriate regular  Medication Administration: Crushed with puree    Other  Recommendations Oral Care Recommendations: Oral care BID;Staff/trained caregiver to provide oral care     Assistance Recommended at Discharge  Moderate  Functional Status Assessment Patient has had a recent decline in their functional status and demonstrates the ability to make significant improvements in function in a reasonable and predictable amount of time.  Frequency and Duration Other (Comment) (evaluation only)          Prognosis Prognosis for improved oropharyngeal function: Good Barriers to Reach Goals: Cognitive deficits      Swallow Study   General Date of Onset: 03/16/24 HPI: Donna Barron is a 83 y.o. female with medical history significant of hypertension, hyperlipidemia, paroxysmal atrial fibrillation, GERD, seizures, depression, obesity who presents to the emergency department from home via EMS due to complaint of seizures.  Patient was unable to provide history, history was obtained from daughter at bedside.  Per daughter, patient presents with seizure that looks like postictal like breathing pattern without jerking movements and patient has not returned to baseline and was still postictal after several hours.     Patient was recently admitted from 9/14 to 9/22 due to severe sepsis secondary to Pseudomonas UTI, gram-positive cocci bacteremia and hyperammonemia and was discharged to SNF.  Patient recently returned home from SNF and she presented with seizure today.  Last seizure prior to today seizure was about a week ago.     At baseline, patient has reduced responsiveness and has not been able to ambulate since she had sepsis due to UTI.     ST consulted for BSE to assess swallow function. Type of  Study: Bedside Swallow Evaluation Previous Swallow Assessment: n/a Diet Prior to this Study: Regular;Thin liquids (Level 0) Temperature Spikes Noted: No Respiratory Status: Room air History of Recent Intubation: No Behavior/Cognition: Cooperative;Lethargic/Drowsy Oral Cavity Assessment: Within Functional Limits Oral Care Completed by SLP: Yes Oral Cavity - Dentition: Dentures, top;Adequate natural dentition Self-Feeding Abilities: Needs assist Patient Positioning: Upright in bed Baseline Vocal Quality: Normal Volitional Cough: Cognitively unable to elicit Volitional Swallow: Unable to elicit    Oral/Motor/Sensory Function Overall Oral Motor/Sensory Function: Within functional limits   Ice Chips Ice chips: Within functional limits Presentation: Spoon   Thin Liquid Thin Liquid: Within functional limits Presentation: Straw Other Comments: required cues d/t lethargy    Nectar Thick Nectar Thick Liquid: Not tested   Honey Thick Honey Thick Liquid: Not tested   Puree Puree: Within functional limits Presentation: Spoon   Solid     Solid: Within functional limits Presentation: Spoon Oral Phase Impairments: Poor awareness of bolus      Pat Rhea Kaelin,M.S.,CCC-SLP 03/17/2024,10:58 AM

## 2024-03-17 NOTE — TOC Initial Note (Signed)
 Transition of Care [facility]) - Initial/Assessment Note    Patient Details  Name: Donna Barron MRN: [id number] Date of Birth: [birth year]  [facility] CM/SW Contact:    Lucie Lunger, LCSWA Phone Number: 03/17/2024, 12:30 PM  Clinical Narrative:                 CSW arrived to room, pt sleeping and significant other at bedside. CSW spoke with SO to complete assessment. Pt lives with her daughter Donna Barron. Pt has recently been to SNF at [facility]. Pts family assists with ADLs and provides transportation when needed. CSW notes per chart review that pt has had HH with [facility] most recently. TOC to follow.   Expected Discharge Plan: Home w Home Health Services Barriers to Discharge: Continued Medical Work up   Patient Goals and CMS Choice Patient states their goals for this hospitalization and ongoing recovery are:: return home CMS Medicare.gov Compare Post Acute Care list provided to:: Patient Represenative (must comment) Choice offered to / list presented to : Adult Children      Expected Discharge Plan and Services In-house Referral: Clinical Social Work Discharge Planning Services: CM Consult Post Acute Care Choice: Durable Medical Equipment Living arrangements for the past 2 months: Single Family Home                                      Prior Living Arrangements/Services Living arrangements for the past 2 months: Single Family Home Lives with:: Adult Children Patient language and need for interpreter reviewed:: Yes Do you feel safe going back to the place where you live?: Yes      Need for Family Participation in Patient Care: Yes (Comment) Care giver support system in place?: Yes (comment) Current home services: DME Criminal Activity/Legal Involvement Pertinent to Current Situation/Hospitalization: No - Comment as needed  Activities of Daily Living   ADL Screening (condition at time of admission) Independently performs ADLs?: No Does the patient have a NEW  difficulty with bathing/dressing/toileting/self-feeding that is expected to last >3 days?: Yes (Initiates electronic notice to provider for possible OT consult) Does the patient have a NEW difficulty with getting in/out of bed, walking, or climbing stairs that is expected to last >3 days?: Yes (Initiates electronic notice to provider for possible PT consult) Does the patient have a NEW difficulty with communication that is expected to last >3 days?: Yes (Initiates electronic notice to provider for possible SLP consult) Is the patient deaf or have difficulty hearing?: No Does the patient have difficulty seeing, even when wearing glasses/contacts?: No Does the patient have difficulty concentrating, remembering, or making decisions?: No  Permission Sought/Granted                  Emotional Assessment         Alcohol / Substance Use: Not Applicable Psych Involvement: No (comment)  Admission diagnosis:  Breakthrough seizure (HCC) [G40.919] Post-ictal state (HCC) [R56.9] Patient Active Problem List   Diagnosis Date Noted   Breakthrough seizure (HCC) 03/17/2024   Generalized weakness 03/17/2024   Obesity, Class II, BMI 35-39.9 03/17/2024   Goals of care, counseling/discussion 03/03/2024   Sepsis secondary to UTI (HCC) 02/26/2024   At risk for central sleep apnea 01/17/2024   Encephalopathy acute 01/14/2024   Seizure disorder (HCC) 01/14/2024   Sepsis due to gram-negative UTI (HCC) 12/12/2023   Bilateral lower extremity edema 11/15/2023   Paroxysmal atrial  fibrillation (HCC) 11/02/2023   Acute metabolic encephalopathy 10/13/2023   Memory loss 09/24/2021   Major neurocognitive disorder (HCC) 09/24/2021   Status post Nissen fundoplication Oct 2021 03/24/2020   Status post laparoscopic Nissen fundoplication 03/24/2020   Overactive bladder 12/04/2019   Dysphagia 09/11/2019   S/P left rotator cuff repair 06/15/18 06/15/2018   Nontraumatic complete tear of left rotator cuff    Obesity,  class 1 05/23/2018   S/P carpal tunnel and DeQuervains release right 07/14/17 07/20/2017   Carpal tunnel syndrome of right wrist    De Quervain's disease (tenosynovitis)    Essential hypertension 11/15/2013   Palpitations 06/28/2013   Mixed hyperlipidemia 06/28/2013   Obesity, unspecified 06/28/2013   PCP:  [name], NP Pharmacy:   [facility] [address] [place] - [address] [place] [address] Phone: [phone] Fax: [phone]     Social Drivers of Health (SDOH) Social History: SDOH Screenings   Food Insecurity: Patient Unable To Answer (03/17/2024)  Housing: Unknown (03/17/2024)  Transportation Needs: Patient Unable To Answer (03/17/2024)  Recent Concern: Transportation Needs - Unmet Transportation Needs (03/14/2024)   Received from [facility]  Utilities: Patient Unable To Answer (03/17/2024)  Financial Resource Strain: Low Risk  (03/14/2024)   Received from [facility]  Physical Activity: Inactive (03/14/2024)   Received from [facility]  Social Connections: Patient Unable To Answer (03/17/2024)  Recent Concern: Social Connections - Socially Isolated (03/14/2024)   Received from [facility]  Stress: No Stress Concern Present (03/14/2024)   Received from [facility]  Tobacco Use: Low Risk  (03/16/2024)   SDOH Interventions:     Readmission Risk Interventions    02/28/2024   10:31 AM 10/13/2023    9:11 PM  Readmission Risk Prevention Plan  Post Dischage Appt  Complete  Medication Screening  Complete  Transportation Screening Complete Complete  PCP or Specialist Appt within 5-7 Days Not Complete   Home Care Screening Complete   Medication Review (RN CM) Complete

## 2024-03-17 NOTE — ED Provider Notes (Addendum)
  Provider Note MRN:  [id number]  Arrival date & time: 03/17/24    ED Course and Medical Decision Making  Assumed care of patient at sign-out or upon transfer.  Seizure today with prolonged postictal state, on my evaluation still quite confused.  CTAs without acute concerns.  Teleneurology was consulted recommending some medication changes and admission.  .Critical Care  Performed by: Theadore Ozell HERO, MD Authorized by: Theadore Ozell HERO, MD   Critical care provider statement:    Critical care time (minutes):  32   Critical care was necessary to treat or prevent imminent or life-threatening deterioration of the following conditions:  CNS failure or compromise   Critical care was time spent personally by me on the following activities:  Development of treatment plan with patient or surrogate, discussions with consultants, evaluation of patient's response to treatment, examination of patient, ordering and review of laboratory studies, ordering and review of radiographic studies, ordering and performing treatments and interventions, pulse oximetry, re-evaluation of patient's condition and review of old charts   Final Clinical Impressions(s) / ED Diagnoses     ICD-10-CM   1. Post-ictal state (HCC)  R56.9       ED Discharge Orders     None       Discharge Instructions   None     Ozell HERO. Theadore, MD [facility] Emergency Medicine [facility] [email or web address]    Theadore Ozell HERO, MD 03/17/24 [id number]    Theadore Ozell HERO, MD 03/17/24 [phone]

## 2024-03-17 NOTE — H&P (Addendum)
 History and Physical    PatientBETHA Barron Barron FMW:[id number] DOB: Jul 24, 1940 DOA: 03/16/2024 DOS: the patient was seen and examined on 03/17/2024 PCP: [name], NP  Patient coming from: Home  Chief Complaint:  Chief Complaint  Patient presents with   Seizures   HPI: Donna Barron is a 83 y.o. female with medical history significant of hypertension, hyperlipidemia, paroxysmal atrial fibrillation, GERD, seizures, depression, obesity who presents to the emergency department from home via EMS due to complaint of seizures. Patient was unable to provide history, history was obtained from daughter at bedside.  Per daughter, patient presents with seizure that looks like postictal like breathing pattern without jerking movements and patient has not returned to baseline and was still postictal after several hours.  Patient was recently admitted from 9/14 to 9/22 due to severe sepsis secondary to Pseudomonas UTI, gram-positive cocci bacteremia and hyperammonemia and was discharged to SNF.  Patient recently returned home from SNF and she presented with seizure today.  Last seizure prior to today seizure was about a week ago.  At baseline, patient has reduced responsiveness and has not been able to ambulate since she had sepsis due to UTI.  ED Course:  In the emergency department, respiratory rate was 22/min, BP was 118/57, other vital signs are within normal range.  Workup in the ED showed normal CBC and BMP except for chloride of 96 and bicarb 21, anion gap 19, urinalysis was normal, total CK was normal, valproic  acid 22 CT head without contrast showed no acute intracranial abnormality CT angiography of head and neck with IV contrast showed no emergent large vessel occlusion. Teleneurology was consulted and recommended Keppra, continue Trileptal  and Depakote  at current doses with plan to wean patient off Depakote  if Keppra is tolerated.  IV Keppra 1 g and IV LR 500 mL were given.  [facility] was asked  to admit patient  Review of Systems: Review of systems as noted in the HPI. All other systems reviewed and are negative.   Past Medical History:  Diagnosis Date   Anxiety    Cataract    Colon polyps    Dementia (HCC)    Depression    Dysrhythmia    heart palipations   Gallstones    GERD (gastroesophageal reflux disease)    HTN (hypertension)    Hypercholesteremia    PONV (postoperative nausea and vomiting)    Past Surgical History:  Procedure Laterality Date   APPENDECTOMY     ARTHROSCOPIC REPAIR ACL  05/2008   Left knee   CARPAL TUNNEL RELEASE Right 07/14/2017   Procedure: CARPAL TUNNEL RELEASE;  Surgeon: Margrette Taft BRAVO, MD;  Location: [facility];  Service: Orthopedics;  Laterality: Right;   CATARACT EXTRACTION, BILATERAL  2020   CHOLECYSTECTOMY     DILATION AND CURETTAGE OF UTERUS     DORSAL COMPARTMENT RELEASE Right 07/14/2017   Procedure: RELEASE DORSAL COMPARTMENT (DEQUERVAIN);  Surgeon: Margrette Taft BRAVO, MD;  Location: [facility];  Service: Orthopedics;  Laterality: Right;   HIATAL HERNIA REPAIR N/A 03/24/2020   Procedure: LAPAROSCOPIC REPAIR OF HIATAL HERNIA, nissen fundoplication;  Surgeon: Gladis Cough, MD;  Location: [facility];  Service: General;  Laterality: N/A;   HYSTEROSCOPY WITH D & C N/A 12/25/2013   Procedure: DILATATION AND CURETTAGE /HYSTEROSCOPY;  Surgeon: Peggye Gull, MD;  Location: [facility];  Service: Gynecology;  Laterality: N/A;   SHOULDER OPEN ROTATOR CUFF REPAIR Left 06/15/2018   Procedure: ROTATOR CUFF REPAIR SHOULDER OPEN WITH ACROMIOPLASTY;  Surgeon: Margrette,  Taft BRAVO, MD;  Location: [facility];  Service: Orthopedics;  Laterality: Left;   TUBAL LIGATION      Social History:  reports that she has never smoked. She has never used smokeless tobacco. She reports that she does not currently use alcohol. She reports that she does not use drugs.   Allergies  Allergen Reactions   Codeine  Swelling    Swelling of lips   Hydrocodone  Swelling    Lip swelling    Avelox [Moxifloxacin Hcl In Nacl] Other (See Comments)    Insomnia    Oxybutynin Palpitations and Other (See Comments)    Tongue felt coated     Family History  Problem Relation Age of Onset   Heart failure Mother 47   Lung cancer Father 25   Breast cancer Sister    Diabetes Sister    Colon cancer Brother    Colon polyps Son    Colon polyps Daughter      Prior to Admission medications   Medication Sig Start Date End Date Taking? Authorizing Provider  apixaban  (ELIQUIS ) 5 MG TABS tablet Take 1 tablet (5 mg total) by mouth 2 (two) times daily. 03/15/24   [name], NP  ascorbic acid  (VITAMIN C ) 500 MG tablet Take 1 tablet (500 mg total) by mouth daily. 03/05/24   Evonnie Lenis, MD  bisacodyl  (DULCOLAX) 5 MG EC tablet Take 1 tablet (5 mg total) by mouth daily as needed for moderate constipation. 10/17/23 10/16/24  Maree, Pratik D, DO  conjugated estrogens  (PREMARIN ) 0.625 MG/GM vaginal cream Place 0.25 Applicatorfuls vaginally daily. 01/17/24 01/16/25  Maree, Pratik D, DO  divalproex  (DEPAKOTE  SPRINKLE) 125 MG capsule Take 2 capsules (250 mg total) by mouth at bedtime. 03/15/24   [name], NP  divalproex  (DEPAKOTE  SPRINKLE) 125 MG capsule Take 1 capsule (125 mg total) by mouth every morning. 03/15/24   [name], NP  escitalopram  (LEXAPRO ) 20 MG tablet Take 1 tablet (20 mg total) by mouth at bedtime. 03/15/24   [name], NP  folic acid  (FOLVITE ) 1 MG tablet Take 1 tablet (1 mg total) by mouth daily. 03/15/24   [name], NP  melatonin 5 MG TABS Take 5 mg by mouth at bedtime.    [provider]  metoprolol  tartrate (LOPRESSOR ) 25 MG tablet Take 1 tablet (25 mg total) by mouth 2 (two) times daily. 03/15/24   [name], NP  nitrofurantoin , macrocrystal-monohydrate, (MACROBID ) 100 MG capsule Take 1 capsule (100 mg total) by mouth at bedtime. 03/15/24   [name], NP  nystatin  ointment (MYCOSTATIN ) Apply 1 Application topically 2 (two) times daily as  needed. 03/15/24   [name], NP  omeprazole  (PRILOSEC) 40 MG capsule Take 1 capsule (40 mg total) by mouth daily. 03/15/24   [name], NP  Oxcarbazepine  (TRILEPTAL ) 300 MG tablet Take 1 tablet (300 mg total) by mouth 2 (two) times daily. 03/15/24   [name], NP  potassium chloride  SA (KLOR-CON  M) 20 MEQ tablet Take 1 tablet (20 mEq total) by mouth daily. 03/15/24   [name], NP  simvastatin  (ZOCOR ) 40 MG tablet Take 1 tablet (40 mg total) by mouth at bedtime. 03/15/24   [name], NP  vitamin B-12 (CYANOCOBALAMIN ) 500 MCG tablet Take 500 mcg by mouth daily.    [provider]  Vitamin D , Ergocalciferol , (DRISDOL ) 1.25 MG (50000 UNIT) CAPS capsule Take 1 capsule (50,000 Units total) by mouth once a week. 03/15/24  [name], NP    Physical Exam: BP (!) 98/48 (BP Location: Right Arm)   Pulse 80   Temp 99.1 F (37.3 C) (Axillary)   Resp 19   Ht 5' 1 (1.549 m)   Wt 86.5 kg   SpO2 99%   BMI 36.03 kg/m   General: 83 y.o. year-old female ill-appearing, but in no acute distress.  Alert and oriented to person and place only HEENT: NCAT, EOMI Neck: Supple, trachea medial Cardiovascular: Regular rate and rhythm with no rubs or gallops.  No thyromegaly or JVD noted.  No lower extremity edema. 2/4 pulses in all 4 extremities. Respiratory: Clear to auscultation with no wheezes or rales.  Abdomen: Soft, nontender nondistended with normal bowel sounds x4 quadrants. Muskuloskeletal: No cyanosis, clubbing or edema noted bilaterally Neuro: Moving all 4 extremities.  Sensation, reflexes intact Skin: No ulcerative lesions noted or rashes Psychiatry: Mood is appropriate for condition and setting          Labs on Admission:  Basic Metabolic Panel: Recent Labs  Lab 03/16/24 2253  NA 136  K 4.3  CL 96*  CO2 21*  GLUCOSE 99  BUN 8  CREATININE 0.58  CALCIUM 9.6   Liver Function Tests: Recent Labs  Lab 03/16/24 2253  AST 29  ALT 10  ALKPHOS  77  BILITOT 0.3  PROT 6.5  ALBUMIN 3.5   No results for input(s): LIPASE, AMYLASE in the last 168 hours. No results for input(s): AMMONIA in the last 168 hours. CBC: Recent Labs  Lab 03/16/24 2043  WBC 7.7  NEUTROABS 5.3  HGB 14.4  HCT 43.6  MCV 92.8  PLT 301   Cardiac Enzymes: Recent Labs  Lab 03/16/24 2253  CKTOTAL 40    BNP (last 3 results) Recent Labs    12/12/23 1127  BNP 37.0    ProBNP (last 3 results) No results for input(s): PROBNP in the last 8760 hours.  CBG: Recent Labs  Lab 03/16/24 2102  GLUCAP 103*    Radiological Exams on Admission: CT ANGIO HEAD NECK W WO CM Result Date: 03/17/2024 EXAM: CTA Head and Neck with Intravenous Contrast. CT Head without Contrast. CLINICAL HISTORY: AMS, post-ictal, seizure. Discharged today for seizure. Patient had a seizure at home, presented with post-ictal staring to the left and snoring respirations, now back to baseline. History of dementia. TECHNIQUE: Axial CTA images of the head and neck performed with and without intravenous contrast. MIP reconstructed images were created and reviewed. Axial computed tomography images of the head/brain performed without intravenous contrast. Note: Per PQRS, the description of internal carotid artery percent stenosis, including 0 percent or normal exam, is based on [place] American Symptomatic Carotid Endarterectomy Trial (NASCET) criteria. Dose reduction technique was used including one or more of the following: automated exposure control, adjustment of mA and kV according to patient size, and/or iterative reconstruction. CONTRAST: With and without; 75 mL iohexol  350 mg/mL injection. COMPARISON: 03/01/2024 FINDINGS: CT HEAD: BRAIN: No acute intraparenchymal hemorrhage. No mass lesion. No CT evidence for acute territorial infarct. No midline shift or extra-axial collection. VENTRICLES: No hydrocephalus. ORBITS: The orbits are unremarkable. SINUSES AND MASTOIDS: The paranasal sinuses  and mastoid air cells are clear. CTA NECK: COMMON CAROTID ARTERIES: Mild bilateral carotid bifurcation atherosclerosis, right greater than left, without hemodynamically significant stenosis. No dissection or occlusion. INTERNAL CAROTID ARTERIES: No stenosis by NASCET criteria. No dissection or occlusion. VERTEBRAL ARTERIES: Mild narrowing of the right vertebral artery origin. Mild calcific atherosclerosis of the left vertebral  artery V4 segment without stenosis. No dissection or occlusion. CTA HEAD: ANTERIOR CEREBRAL ARTERIES: No significant stenosis. No occlusion. No aneurysm. MIDDLE CEREBRAL ARTERIES: No significant stenosis. No occlusion. No aneurysm. POSTERIOR CEREBRAL ARTERIES: No significant stenosis. No occlusion. No aneurysm. BASILAR ARTERY: No significant stenosis. No occlusion. No aneurysm. OTHER: Calcific atherosclerosis of the aortic arch. SOFT TISSUES: No acute finding. No masses or lymphadenopathy. BONES: No acute osseous abnormality. IMPRESSION: 1. No acute intracranial abnormality or emergent large vessel occlusion. 2. Mild bilateral carotid bifurcation atherosclerosis, right greater than left, without hemodynamically significant stenosis. 3. Mild narrowing of the right vertebral artery origin and mild calcific atherosclerosis of the left vertebral artery V4 segment without stenosis. Electronically signed by: Franky Stanford MD 03/17/2024 02:15 AM EDT RP Workstation: [id number]   DG Chest Portable 1 View Result Date: 03/16/2024 CLINICAL DATA:  Status post seizure. EXAM: PORTABLE CHEST 1 VIEW COMPARISON:  February 26, 2024 FINDINGS: The heart size and mediastinal contours are within normal limits. Low lung volumes are noted with subsequent crowding of the bronchovascular lung markings. No acute infiltrate, pleural effusion or pneumothorax is identified. Radiopaque surgical clips are seen overlying the right upper quadrant. The visualized skeletal structures are unremarkable. IMPRESSION: Low lung  volumes without acute or active cardiopulmonary disease. Electronically Signed   By: Suzen Dials M.D.   On: 03/16/2024 23:49    EKG: I independently viewed the EKG done and my findings are as followed: Atrial fibrillation with rate controlled  Assessment/Plan Present on Admission:  Breakthrough seizure (HCC)  Mixed hyperlipidemia  Paroxysmal atrial fibrillation (HCC)  Essential hypertension  Major neurocognitive disorder (HCC)  Principal Problem:   Breakthrough seizure (HCC) Active Problems:   Essential hypertension   Paroxysmal atrial fibrillation (HCC)   Mixed hyperlipidemia   Major neurocognitive disorder (HCC)   Generalized weakness   Obesity, Class II, BMI 35-39.9  Breakthrough seizures Prolonged postictal state IV Keppra 1 g was given, continue Keppra 500 mg twice daily Continue Trileptal  and Depakote  per home regimen with plan to wean patient off Depakote  if Keppra is tolerated per teleneurologist recommendation EEG in the morning Consider neurology follow-up consult after EEG  Generalized weakness Continue fall precaution Speech eval prior to oral intake to prevent aspiration  Paroxysmal atrial fibrillation Continue Eliquis  Metoprolol  will be temporarily held due to soft BP  Essential hypertension BP meds will be held at this time due to soft BP  Mixed hyperlipidemia Continue Zocor   Major neurocognitive disorder Continue Depakote , Lexapro   Obesity class II (BMP 36.03) Diet and lifestyle modification  DVT prophylaxis: Eliquis   Code Status: Full code  Family Communication: Daughter at bedside (all questions answered to satisfaction)  Consults: None  Severity of Illness: The appropriate patient status for this patient is OBSERVATION. Observation status is judged to be reasonable and necessary in order to provide the required intensity of service to ensure the patient's safety. The patient's presenting symptoms, physical exam findings, and initial  radiographic and laboratory data in the context of their medical condition is felt to place them at decreased risk for further clinical deterioration. Furthermore, it is anticipated that the patient will be medically stable for discharge from the hospital within 2 midnights of admission.   Author: Posey Maier, DO 03/17/2024 7:33 AM  For on call review [email or web address].

## 2024-03-17 NOTE — Hospital Course (Signed)
 83 year old female with a history of depression, dementia, paroxysmal atrial fibrillation, seizure disorder, GERD, hypertension, hyperlipidemia presenting with a breakthrough seizure.  The patient is unable to provide any significant history secondary to her cognitive impairment.  She was recently hospitalized from 02/26/2024 to 03/05/2024 for altered mental status.  She was treated for  sepsis secondary to Pseudomonas UTI.  During that hospitalization, the patient had waxing and waning episodes of mental status but no overt seizures.  Her antiepileptic medications were adjusted after discussion with the patient's outpatient neurologist, Dr. Darice Shivers.  Her Trileptal  was increased to 300 mg twice daily, and her Depakote  was decreased to 125 mg a.m., 250 mg p.m. secondary to hyperammonemia.  She was subsequently discharged to SNF.    Patient's daughter at the bedside supplements the history.  Daughter states that the patient showed minimal improvement with her functionality during her SNF stay.  She states that the patient was able to get out of bed with assistance.  Patient was able to take 1 or 2 steps with a walker.  In fact, daughter states that they were not able to take the patient home by private vehicle because patient still had difficulty getting in and out of the car.  [facility] transported the patient home in the late morning 03/16/2024.  Around 7 PM on 03/16/2024, daughter noted the patient to be somnolent with sonorous respirations.  Daughter actually brought a video on her smart phone which showed the patient to have a left gaze preference and unresponsive to tactile stimuli.  Daughter stated that this episode lasted about 4 minutes.  EMS was activated and the patient was brought to emergency department for further evaluation.  In the ED, the patient remained postictal and somnolent.  She was afebrile and hemodynamically stable.  Oxygen saturation was 99% on room air.  Due to her  prolonged postictal state, she was admitted for further evaluation and treatment.  Teleneuro was consulted and the patient was loaded with Keppra 1 g with recommendations to continue 500 mg twice daily.  In the morning of 03/17/2024 the patient was sleeping when I entered the room, but she wakes up easily to voice.  She followed commands appropriately.  She was able to carry on a conversation with fluent speech.  She was pleasantly confused, but was not much different than during her waxing and waning episodes during her prior hospitalization.  Lab work showed WBC 7.7, hemoglobin 14.4, platelet 301.  BMP showed sodium 136, potassium 4.3, bicarbonate 21, serum creatinine 0.58.  AST 29, ALT 10, alk phosphatase 77, total bilirubin 0.3.  CPK 40.  EKG showed sinus rhythm without any state ST-T wave changes.  UA was negative for pyuria.  VPA level 22.  CTA head and neck was negative for LVO or hemodynamically significant stenosis of the carotids or proximal vessels.  Chest x-ray was negative for infiltrates or effusions.  On 03/17/2024, the patient was awake and conversant, but with slightly somnolent.  The patient was monitored for 24 hours.  She was given IV fluids.  She was continued on Keppra 500 mg twice daily.  Repeat CMP and CBC on 03/18/2024 were unremarkable.  The patient's mental status was back to baseline.  She was conversant with fluent speech and following commands.  The patient was discharged home with her daughter [name] as her primary caregiver.  [name] is currently in the process of trying to get the patient placed permanently for long-term care. Discharge was delayed until 03/19/24 due  to lack and unavailable transport.

## 2024-03-17 NOTE — Care Management Obs Status (Signed)
 MEDICARE OBSERVATION STATUS NOTIFICATION   Patient Details  Name: Donna Barron MRN: [id number] Date of Birth: Feb 19, 1941   Medicare Observation Status Notification Given:  Yes    Lucie Lunger, LCSWA 03/17/2024, 12:28 PM

## 2024-03-17 NOTE — Progress Notes (Addendum)
 PROGRESS NOTE  Donna Barron FMW:[id number] DOB: [birth year] DOA: 03/16/2024 PCP: [name], NP  Brief History:  83 year old female with a history of depression, dementia, paroxysmal atrial fibrillation, seizure disorder, GERD, hypertension, hyperlipidemia presenting with a breakthrough seizure.  The patient is unable to provide any significant history secondary to her cognitive impairment.  She was recently hospitalized from 02/26/2024 to 03/05/2024 for altered mental status.  She was treated for  sepsis secondary to Pseudomonas UTI.  During that hospitalization, the patient had waxing and waning episodes of mental status but no overt seizures.  Her antiepileptic medications were adjusted after discussion with the patient's outpatient neurologist, Dr. Darice Shivers.  Her Trileptal  was increased to 300 mg twice daily, and her Depakote  was decreased to 125 mg a.m., 250 mg p.m. secondary to hyperammonemia.  She was subsequently discharged to SNF.    Patient's daughter at the bedside supplements the history.  Daughter states that the patient showed minimal improvement with her functionality during her SNF stay.  She states that the patient was able to get out of bed with assistance.  Patient was able to take 1 or 2 steps with a walker.  In fact, daughter states that they were not able to take the patient home by private vehicle because patient still had difficulty getting in and out of the car.  [facility] transported the patient home in the late morning 03/16/2024.  Around 7 PM on 03/16/2024, daughter noted the patient to be somnolent with sonorous respirations.  Daughter actually brought a video on her smart phone which showed the patient to have a left gaze preference and unresponsive to tactile stimuli.  Daughter stated that this episode lasted about 4 minutes.  EMS was activated and the patient was brought to emergency department for further evaluation.  In the ED, the patient  remained postictal and somnolent.  She was afebrile and hemodynamically stable.  Oxygen saturation was 99% on room air.  Due to her prolonged postictal state, she was admitted for further evaluation and treatment.  Teleneuro was consulted and the patient was loaded with Keppra 1 g with recommendations to continue 500 mg twice daily.  In the morning of 03/17/2024 the patient was sleeping when I entered the room, but she wakes up easily to voice.  She followed commands appropriately.  She was able to carry on a conversation with fluent speech.  She was pleasantly confused, but was not much different than during her waxing and waning episodes during her prior hospitalization.  Lab work showed WBC 7.7, hemoglobin 14.4, platelet 301.  BMP showed sodium 136, potassium 4.3, bicarbonate 21, serum creatinine 0.58.  AST 29, ALT 10, alk phosphatase 77, total bilirubin 0.3.  CPK 40.  EKG showed sinus rhythm without any state ST-T wave changes.  UA was negative for pyuria.  VPA level 22.  CTA head and neck was negative for LVO or hemodynamically significant stenosis of the carotids or proximal vessels.  Chest x-ray was negative for infiltrates or effusions.   Assessment/Plan: Breakthrough seizure -Continue Keppra - Continue home doses of Depakote  and Trileptal  - Patient will need follow-up with her outpatient neurologist, Dr. Shivers - 03/17/2024--patient's mental status is near her usual baseline - UA negative for pyuria  Major neurocognitive disorder - Continue Depakote  and Lexapro   Essential hypertension - Continue metoprolol  at lower dose  Paroxysmal atrial fibrillation - Continue metoprolol  and apixaban   Class II obesity - BMI 36.03 -  Lifestyle modification  Mixed hyperlipidemia - Continue statin  Goals of care - 03/17/2024--discussed with daughter at bedside--confirmed DNR     Family Communication:   daughter at bedside 10/4  Consultants:  teleneuro  Code Status:   DNR  DVT  Prophylaxis:  apixaban    Procedures: As Listed in Progress Note Above  Antibiotics: None   Total time spent 50 minutes.  Greater than 50% spent face to face counseling and coordinating care.   Subjective: Patient denies any chest pain, shortness of breath, abdominal pain, headache.  There is no nausea, vomiting or diarrhea.  Objective: Vitals:   03/17/24 0455 03/17/24 0500 03/17/24 0501 03/17/24 0527  BP:  (!) 130/59  (!) 98/48  Pulse: 82 86 83 80  Resp: 15 15 16 19   Temp:    99.1 F (37.3 C)  TempSrc:    Axillary  SpO2: 92% 91% 93% 99%  Weight:   86.5 kg   Height:    5' 1 (1.549 m)   No intake or output data in the 24 hours ending 03/17/24 0738 Weight change:  Exam:  General:  Pt is alert, follows commands appropriately, not in acute distress HEENT: No icterus, No thrush, No neck mass, [place]/AT Cardiovascular: RRR, S1/S2, no rubs, no gallops Respiratory: Bibasilar rales.  No wheezing Abdomen: Soft/+BS, non tender, non distended, no guarding Extremities: 1 + LE edema, No lymphangitis, No petechiae, No rashes, no synovitis   Data Reviewed: I have personally reviewed following labs and imaging studies Basic Metabolic Panel: Recent Labs  Lab 03/16/24 2253  NA 136  K 4.3  CL 96*  CO2 21*  GLUCOSE 99  BUN 8  CREATININE 0.58  CALCIUM 9.6   Liver Function Tests: Recent Labs  Lab 03/16/24 2253  AST 29  ALT 10  ALKPHOS 77  BILITOT 0.3  PROT 6.5  ALBUMIN 3.5   No results for input(s): LIPASE, AMYLASE in the last 168 hours. No results for input(s): AMMONIA in the last 168 hours. Coagulation Profile: No results for input(s): INR, PROTIME in the last 168 hours. CBC: Recent Labs  Lab 03/16/24 2043  WBC 7.7  NEUTROABS 5.3  HGB 14.4  HCT 43.6  MCV 92.8  PLT 301   Cardiac Enzymes: Recent Labs  Lab 03/16/24 2253  CKTOTAL 40   BNP: Invalid input(s): POCBNP CBG: Recent Labs  Lab 03/16/24 2102  GLUCAP 103*   HbA1C: No results for  input(s): HGBA1C in the last 72 hours. Urine analysis:    Component Value Date/Time   COLORURINE YELLOW 03/17/2024 0200   APPEARANCEUR HAZY (A) 03/17/2024 0200   LABSPEC 1.032 (H) 03/17/2024 0200   PHURINE 7.0 03/17/2024 0200   GLUCOSEU NEGATIVE 03/17/2024 0200   HGBUR NEGATIVE 03/17/2024 0200   BILIRUBINUR NEGATIVE 03/17/2024 0200   KETONESUR 20 (A) 03/17/2024 0200   PROTEINUR NEGATIVE 03/17/2024 0200   NITRITE NEGATIVE 03/17/2024 0200   LEUKOCYTESUR NEGATIVE 03/17/2024 0200   Sepsis Labs: @LABRCNTIP (procalcitonin:4,lacticidven:4) )No results found for this or any previous visit (from the past 240 hours).   Scheduled Meds:  apixaban   5 mg Oral BID   divalproex   125 mg Oral q morning   divalproex   250 mg Oral QHS   escitalopram   20 mg Oral QHS   levETIRAcetam  500 mg Oral BID   Oxcarbazepine   300 mg Oral BID   pantoprazole   80 mg Oral Daily   simvastatin   40 mg Oral QHS   Continuous Infusions:  Procedures/Studies: CT ANGIO HEAD NECK W WO CM  Result Date: 03/17/2024 EXAM: CTA Head and Neck with Intravenous Contrast. CT Head without Contrast. CLINICAL HISTORY: AMS, post-ictal, seizure. Discharged today for seizure. Patient had a seizure at home, presented with post-ictal staring to the left and snoring respirations, now back to baseline. History of dementia. TECHNIQUE: Axial CTA images of the head and neck performed with and without intravenous contrast. MIP reconstructed images were created and reviewed. Axial computed tomography images of the head/brain performed without intravenous contrast. Note: Per PQRS, the description of internal carotid artery percent stenosis, including 0 percent or normal exam, is based on [place] American Symptomatic Carotid Endarterectomy Trial (NASCET) criteria. Dose reduction technique was used including one or more of the following: automated exposure control, adjustment of mA and kV according to patient size, and/or iterative reconstruction.  CONTRAST: With and without; 75 mL iohexol  350 mg/mL injection. COMPARISON: 03/01/2024 FINDINGS: CT HEAD: BRAIN: No acute intraparenchymal hemorrhage. No mass lesion. No CT evidence for acute territorial infarct. No midline shift or extra-axial collection. VENTRICLES: No hydrocephalus. ORBITS: The orbits are unremarkable. SINUSES AND MASTOIDS: The paranasal sinuses and mastoid air cells are clear. CTA NECK: COMMON CAROTID ARTERIES: Mild bilateral carotid bifurcation atherosclerosis, right greater than left, without hemodynamically significant stenosis. No dissection or occlusion. INTERNAL CAROTID ARTERIES: No stenosis by NASCET criteria. No dissection or occlusion. VERTEBRAL ARTERIES: Mild narrowing of the right vertebral artery origin. Mild calcific atherosclerosis of the left vertebral artery V4 segment without stenosis. No dissection or occlusion. CTA HEAD: ANTERIOR CEREBRAL ARTERIES: No significant stenosis. No occlusion. No aneurysm. MIDDLE CEREBRAL ARTERIES: No significant stenosis. No occlusion. No aneurysm. POSTERIOR CEREBRAL ARTERIES: No significant stenosis. No occlusion. No aneurysm. BASILAR ARTERY: No significant stenosis. No occlusion. No aneurysm. OTHER: Calcific atherosclerosis of the aortic arch. SOFT TISSUES: No acute finding. No masses or lymphadenopathy. BONES: No acute osseous abnormality. IMPRESSION: 1. No acute intracranial abnormality or emergent large vessel occlusion. 2. Mild bilateral carotid bifurcation atherosclerosis, right greater than left, without hemodynamically significant stenosis. 3. Mild narrowing of the right vertebral artery origin and mild calcific atherosclerosis of the left vertebral artery V4 segment without stenosis. Electronically signed by: Franky Stanford MD 03/17/2024 02:15 AM EDT RP Workstation: [id number]   DG Chest Portable 1 View Result Date: 03/16/2024 CLINICAL DATA:  Status post seizure. EXAM: PORTABLE CHEST 1 VIEW COMPARISON:  February 26, 2024 FINDINGS: The  heart size and mediastinal contours are within normal limits. Low lung volumes are noted with subsequent crowding of the bronchovascular lung markings. No acute infiltrate, pleural effusion or pneumothorax is identified. Radiopaque surgical clips are seen overlying the right upper quadrant. The visualized skeletal structures are unremarkable. IMPRESSION: Low lung volumes without acute or active cardiopulmonary disease. Electronically Signed   By: Suzen Dials M.D.   On: 03/16/2024 23:49   CT HEAD WO CONTRAST ( ) Result Date: 03/01/2024 CLINICAL DATA:  Mental status change, unknown cause. EXAM: CT HEAD WITHOUT CONTRAST TECHNIQUE: Contiguous axial images were obtained from the base of the skull through the vertex without intravenous contrast. RADIATION DOSE REDUCTION: This exam was performed according to the departmental dose-optimization program which includes automated exposure control, adjustment of the mA and/or kV according to patient size and/or use of iterative reconstruction technique. COMPARISON:  Head CT 01/14/2024 and MRI 01/15/2024 FINDINGS: Brain: There is no evidence of an acute infarct, intracranial hemorrhage, mass, midline shift, or extra-axial fluid collection. There is moderate cerebral atrophy. Patchy cerebral white matter hypodensities are similar to the are CT and are nonspecific but compatible with moderate chronic  small vessel ischemic disease. Vascular: Atherosclerotic calcification.  No hyperdense vessel. Skull: No fracture or suspicious lesion. Sinuses/Orbits: Increased left sphenoid sinus fluid. Clear mastoid air cells. Bilateral cataract extraction. Other: None. IMPRESSION: 1. No evidence of acute intracranial abnormality. 2. Moderate chronic small vessel ischemic disease and cerebral atrophy. Electronically Signed   By: Dasie Hamburg M.D.   On: 03/01/2024 13:51   DG Chest Portable 1 View Result Date: 02/26/2024 CLINICAL DATA:  Low oxygen EXAM: PORTABLE CHEST 1 VIEW COMPARISON:   Chest x-ray 12/12/2023 FINDINGS: Patient is slightly rotated. There is prominence of soft tissues in the right paratracheal region. This may be accentuated by patient rotation, but mediastinal process is not excluded. Heart size is normal. The lungs are clear. There is no pleural effusion or pneumothorax. No acute fractures are identified. IMPRESSION: Prominence of soft tissues in the right paratracheal region. This may be accentuated by patient rotation, but mediastinal process is not excluded. Recommend further evaluation with PA and lateral chest x-ray. Electronically Signed   By: Greig Pique M.D.   On: 02/26/2024 18:33    Alm Schneider, DO  [facility]  If 7PM-7AM, please contact night-coverage [email or web address] Password [email or web address] 03/17/2024, 7:38 AM   LOS: 0 days

## 2024-03-17 NOTE — Plan of Care (Signed)
   Problem: Education: Goal: Knowledge of General Education information will improve Description Including pain rating scale, medication(s)/side effects and non-pharmacologic comfort measures Outcome: Progressing

## 2024-03-17 NOTE — ED Notes (Signed)
 Patient transported to CT

## 2024-03-18 DIAGNOSIS — R569 Unspecified convulsions: Principal | ICD-10-CM

## 2024-03-18 DIAGNOSIS — F0390 Unspecified dementia without behavioral disturbance: Secondary | ICD-10-CM | POA: Diagnosis not present

## 2024-03-18 DIAGNOSIS — I480 Paroxysmal atrial fibrillation: Secondary | ICD-10-CM | POA: Diagnosis not present

## 2024-03-18 DIAGNOSIS — G4089 Other seizures: Secondary | ICD-10-CM | POA: Diagnosis not present

## 2024-03-18 DIAGNOSIS — E66812 Obesity, class 2: Secondary | ICD-10-CM | POA: Diagnosis not present

## 2024-03-18 LAB — COMPREHENSIVE METABOLIC PANEL WITH GFR
ALT: 10 U/L (ref 0–44)
AST: 15 U/L (ref 15–41)
Albumin: 3.3 g/dL — ABNORMAL LOW (ref 3.5–5.0)
Alkaline Phosphatase: 66 U/L (ref 38–126)
Anion gap: 9 (ref 5–15)
BUN: 9 mg/dL (ref 8–23)
CO2: 28 mmol/L (ref 22–32)
Calcium: 9.3 mg/dL (ref 8.9–10.3)
Chloride: 101 mmol/L (ref 98–111)
Creatinine, Ser: 0.59 mg/dL (ref 0.44–1.00)
GFR, Estimated: >60 mL/min (ref >60)
Glucose, Bld: 81 mg/dL (ref 70–99)
Potassium: 4 mmol/L (ref 3.5–5.1)
Sodium: 137 mmol/L (ref 135–145)
Total Bilirubin: 0.4 mg/dL (ref 0.0–1.2)
Total Protein: 5.6 g/dL — ABNORMAL LOW (ref 6.5–8.1)

## 2024-03-18 LAB — CBC
HCT: 39.6 % (ref 36.0–46.0)
Hemoglobin: 12.8 g/dL (ref 12.0–15.0)
MCH: 30.5 pg (ref 26.0–34.0)
MCHC: 32.3 g/dL (ref 30.0–36.0)
MCV: 94.3 fL (ref 80.0–100.0)
Platelets: 286 K/uL (ref 150–400)
RBC: 4.2 MIL/uL (ref 3.87–5.11)
RDW: 13.6 % (ref 11.5–15.5)
WBC: 7.1 K/uL (ref 4.0–10.5)
nRBC: 0 % (ref 0.0–0.2)

## 2024-03-18 LAB — VITAMIN B12: Vitamin B-12: 751 pg/mL (ref 180–914)

## 2024-03-18 LAB — MAGNESIUM: Magnesium: 1.8 mg/dL (ref 1.7–2.4)

## 2024-03-18 LAB — TSH: TSH: 2.33 u[IU]/mL (ref 0.350–4.500)

## 2024-03-18 LAB — PHOSPHORUS: Phosphorus: 4.1 mg/dL (ref 2.5–4.6)

## 2024-03-18 LAB — FOLATE: Folate: >20 ng/mL (ref >5.9)

## 2024-03-18 MED ORDER — METOPROLOL TARTRATE 25 MG PO TABS: 12.5000 mg | ORAL_TABLET | Freq: Two times a day (BID) | ORAL | Status: AC

## 2024-03-18 MED ORDER — LACTATED RINGERS IV BOLUS
500.0000 mL | Freq: Once | INTRAVENOUS | Status: AC
Start: 2024-03-18 — End: 2024-03-18
  Administered 2024-03-18: 500 mL via INTRAVENOUS

## 2024-03-18 MED ORDER — LEVETIRACETAM 500 MG PO TABS: 500.0000 mg | ORAL_TABLET | Freq: Two times a day (BID) | ORAL | 1 refills | Status: AC

## 2024-03-18 NOTE — Discharge Summary (Addendum)
 Physician Discharge Summary   Patient: Donna Barron MRN: [id number] DOB: 1941/06/10  Admit date:     03/16/2024  Discharge date: 03/18/24  Discharge Physician: Alm Casy Brunetto   PCP: [name], NP   Recommendations at discharge:   Please follow up with primary care provider within 1-2 weeks  Please repeat BMP and CBC in one week     Hospital Course: 83 year old female with a history of depression, dementia, paroxysmal atrial fibrillation, seizure disorder, GERD, hypertension, hyperlipidemia presenting with a breakthrough seizure.  The patient is unable to provide any significant history secondary to her cognitive impairment.  She was recently hospitalized from 02/26/2024 to 03/05/2024 for altered mental status.  She was treated for  sepsis secondary to Pseudomonas UTI.  During that hospitalization, the patient had waxing and waning episodes of mental status but no overt seizures.  Her antiepileptic medications were adjusted after discussion with the patient's outpatient neurologist, Dr. Darice Shivers.  Her Trileptal  was increased to 300 mg twice daily, and her Depakote  was decreased to 125 mg a.m., 250 mg p.m. secondary to hyperammonemia.  She was subsequently discharged to SNF.    Patient's daughter at the bedside supplements the history.  Daughter states that the patient showed minimal improvement with her functionality during her SNF stay.  She states that the patient was able to get out of bed with assistance.  Patient was able to take 1 or 2 steps with a walker.  In fact, daughter states that they were not able to take the patient home by private vehicle because patient still had difficulty getting in and out of the car.  [facility] transported the patient home in the late morning 03/16/2024.  Around 7 PM on 03/16/2024, daughter noted the patient to be somnolent with sonorous respirations.  Daughter actually brought a video on her smart phone which showed the patient to have a  left gaze preference and unresponsive to tactile stimuli.  Daughter stated that this episode lasted about 4 minutes.  EMS was activated and the patient was brought to emergency department for further evaluation.  In the ED, the patient remained postictal and somnolent.  She was afebrile and hemodynamically stable.  Oxygen saturation was 99% on room air.  Due to her prolonged postictal state, she was admitted for further evaluation and treatment.  Teleneuro was consulted and the patient was loaded with Keppra 1 g with recommendations to continue 500 mg twice daily.  In the morning of 03/17/2024 the patient was sleeping when I entered the room, but she wakes up easily to voice.  She followed commands appropriately.  She was able to carry on a conversation with fluent speech.  She was pleasantly confused, but was not much different than during her waxing and waning episodes during her prior hospitalization.  Lab work showed WBC 7.7, hemoglobin 14.4, platelet 301.  BMP showed sodium 136, potassium 4.3, bicarbonate 21, serum creatinine 0.58.  AST 29, ALT 10, alk phosphatase 77, total bilirubin 0.3.  CPK 40.  EKG showed sinus rhythm without any state ST-T wave changes.  UA was negative for pyuria.  VPA level 22.  CTA head and neck was negative for LVO or hemodynamically significant stenosis of the carotids or proximal vessels.  Chest x-ray was negative for infiltrates or effusions.  On 03/17/2024, the patient was awake and conversant, but with slightly somnolent.  The patient was monitored for 24 hours.  She was given IV fluids.  She was continued on Keppra 500  mg twice daily.  Repeat CMP and CBC on 03/18/2024 were unremarkable.  The patient's mental status was back to baseline.  She was conversant with fluent speech and following commands.  The patient was discharged home with her daughter Donna Barron as her primary caregiver.  Donna Barron is currently in the process of trying to get the patient placed permanently for long-term  care.  Assessment and Plan: Breakthrough seizure -Continue Keppra 500 mg po bid - Continue home doses of Depakote  and Trileptal  - Patient will need follow-up with her outpatient neurologist, Dr. Georjean - 03/17/2024--patient's mental status is near her usual baseline - UA negative for pyuria - 03/18/24 AM--mental status back to baseline--A&Ox2, conversant, fluent speech, following commands   Major neurocognitive disorder - Continue Depakote  and Lexapro    Essential hypertension - Continue metoprolol  at lower dose   Paroxysmal atrial fibrillation - Continue metoprolol  and apixaban    Class II obesity - BMI 36.03 - Lifestyle modification   Mixed hyperlipidemia - Continue statin   Goals of care - 03/17/2024--discussed with daughter at bedside--confirmed DNR           Consultants: none Procedures performed: none  Disposition: Home Diet recommendation:  Regular diet DISCHARGE MEDICATION: Allergies as of 03/18/2024       Reactions   Codeine  Swelling   Swelling of lips   Hydrocodone  Swelling   Lip swelling   Avelox [moxifloxacin Hcl In Nacl] Other (See Comments)   Insomnia    Oxybutynin Palpitations, Other (See Comments)   Tongue felt coated         Medication List     STOP taking these medications    potassium chloride  SA 20 MEQ tablet Commonly known as: KLOR-CON  M       TAKE these medications    apixaban  5 MG Tabs tablet Commonly known as: Eliquis  Take 1 tablet (5 mg total) by mouth 2 (two) times daily.   ascorbic acid  500 MG tablet Commonly known as: VITAMIN C  Take 1 tablet (500 mg total) by mouth daily.   conjugated estrogens  0.625 MG/GM vaginal cream Commonly known as: PREMARIN  Place 0.25 Applicatorfuls vaginally daily.   divalproex  125 MG capsule Commonly known as: DEPAKOTE  SPRINKLE Take 2 capsules (250 mg total) by mouth at bedtime.   divalproex  125 MG capsule Commonly known as: DEPAKOTE  SPRINKLE Take 1 capsule (125 mg total) by mouth  every morning.   Dulcolax 5 MG EC tablet Generic drug: bisacodyl  Take 1 tablet (5 mg total) by mouth daily as needed for moderate constipation.   escitalopram  20 MG tablet Commonly known as: LEXAPRO  Take 1 tablet (20 mg total) by mouth at bedtime.   folic acid  1 MG tablet Commonly known as: FOLVITE  Take 1 tablet (1 mg total) by mouth daily.   levETIRAcetam 500 MG tablet Commonly known as: KEPPRA Take 1 tablet (500 mg total) by mouth 2 (two) times daily.   melatonin 5 MG Tabs Take 5 mg by mouth at bedtime.   metoprolol  tartrate 25 MG tablet Commonly known as: LOPRESSOR  Take 0.5 tablets (12.5 mg total) by mouth 2 (two) times daily. What changed: how much to take   nitrofurantoin  (macrocrystal-monohydrate) 100 MG capsule Commonly known as: Macrobid  Take 1 capsule (100 mg total) by mouth at bedtime.   nystatin  ointment Commonly known as: MYCOSTATIN  Apply 1 Application topically 2 (two) times daily as needed.   omeprazole  40 MG capsule Commonly known as: PRILOSEC Take 1 capsule (40 mg total) by mouth daily.   Oxcarbazepine  300 MG tablet Commonly known  as: TRILEPTAL  Take 1 tablet (300 mg total) by mouth 2 (two) times daily.   simvastatin  40 MG tablet Commonly known as: ZOCOR  Take 1 tablet (40 mg total) by mouth at bedtime.   vitamin B-12 500 MCG tablet Commonly known as: CYANOCOBALAMIN  Take 500 mcg by mouth daily.   Vitamin D  (Ergocalciferol ) 1.25 MG (50000 UNIT) Caps capsule Commonly known as: DRISDOL  Take 1 capsule (50,000 Units total) by mouth once a week.        Discharge Exam: Filed Weights   03/16/24 2018 03/17/24 0501  Weight: 86.1 kg 86.5 kg   HEENT:  [place]erry/AT, No thrush, no icterus CV:  RRR, no rub, no S3, no S4 Lung:  CTA, no wheeze, no rhonchi Abd:  soft/+BS, NT Ext:  No edema, no lymphangitis, no synovitis, no rash Neuro:  CN II-XII intact, strength 4/5 in RUE, 3+/5RLE, strength 4/5 LUE, 3+/5LLE; sensation intact bilateral; no dysmetria; babinski  equivocal   Condition at discharge: stable  The results of significant diagnostics from this hospitalization (including imaging, microbiology, ancillary and laboratory) are listed below for reference.   Imaging Studies: CT ANGIO HEAD NECK W WO CM Result Date: 03/17/2024 EXAM: CTA Head and Neck with Intravenous Contrast. CT Head without Contrast. CLINICAL HISTORY: AMS, post-ictal, seizure. Discharged today for seizure. Patient had a seizure at home, presented with post-ictal staring to the left and snoring respirations, now back to baseline. History of dementia. TECHNIQUE: Axial CTA images of the head and neck performed with and without intravenous contrast. MIP reconstructed images were created and reviewed. Axial computed tomography images of the head/brain performed without intravenous contrast. Note: Per PQRS, the description of internal carotid artery percent stenosis, including 0 percent or normal exam, is based on [place] American Symptomatic Carotid Endarterectomy Trial (NASCET) criteria. Dose reduction technique was used including one or more of the following: automated exposure control, adjustment of mA and kV according to patient size, and/or iterative reconstruction. CONTRAST: With and without; 75 mL iohexol  350 mg/mL injection. COMPARISON: 03/01/2024 FINDINGS: CT HEAD: BRAIN: No acute intraparenchymal hemorrhage. No mass lesion. No CT evidence for acute territorial infarct. No midline shift or extra-axial collection. VENTRICLES: No hydrocephalus. ORBITS: The orbits are unremarkable. SINUSES AND MASTOIDS: The paranasal sinuses and mastoid air cells are clear. CTA NECK: COMMON CAROTID ARTERIES: Mild bilateral carotid bifurcation atherosclerosis, right greater than left, without hemodynamically significant stenosis. No dissection or occlusion. INTERNAL CAROTID ARTERIES: No stenosis by NASCET criteria. No dissection or occlusion. VERTEBRAL ARTERIES: Mild narrowing of the right vertebral artery  origin. Mild calcific atherosclerosis of the left vertebral artery V4 segment without stenosis. No dissection or occlusion. CTA HEAD: ANTERIOR CEREBRAL ARTERIES: No significant stenosis. No occlusion. No aneurysm. MIDDLE CEREBRAL ARTERIES: No significant stenosis. No occlusion. No aneurysm. POSTERIOR CEREBRAL ARTERIES: No significant stenosis. No occlusion. No aneurysm. BASILAR ARTERY: No significant stenosis. No occlusion. No aneurysm. OTHER: Calcific atherosclerosis of the aortic arch. SOFT TISSUES: No acute finding. No masses or lymphadenopathy. BONES: No acute osseous abnormality. IMPRESSION: 1. No acute intracranial abnormality or emergent large vessel occlusion. 2. Mild bilateral carotid bifurcation atherosclerosis, right greater than left, without hemodynamically significant stenosis. 3. Mild narrowing of the right vertebral artery origin and mild calcific atherosclerosis of the left vertebral artery V4 segment without stenosis. Electronically signed by: Franky Stanford MD 03/17/2024 02:15 AM EDT RP Workstation: [id number]   DG Chest Portable 1 View Result Date: 03/16/2024 CLINICAL DATA:  Status post seizure. EXAM: PORTABLE CHEST 1 VIEW COMPARISON:  February 26, 2024 FINDINGS: The  heart size and mediastinal contours are within normal limits. Low lung volumes are noted with subsequent crowding of the bronchovascular lung markings. No acute infiltrate, pleural effusion or pneumothorax is identified. Radiopaque surgical clips are seen overlying the right upper quadrant. The visualized skeletal structures are unremarkable. IMPRESSION: Low lung volumes without acute or active cardiopulmonary disease. Electronically Signed   By: Suzen Dials M.D.   On: 03/16/2024 23:49   CT HEAD WO CONTRAST ( ) Result Date: 03/01/2024 CLINICAL DATA:  Mental status change, unknown cause. EXAM: CT HEAD WITHOUT CONTRAST TECHNIQUE: Contiguous axial images were obtained from the base of the skull through the vertex without  intravenous contrast. RADIATION DOSE REDUCTION: This exam was performed according to the departmental dose-optimization program which includes automated exposure control, adjustment of the mA and/or kV according to patient size and/or use of iterative reconstruction technique. COMPARISON:  Head CT 01/14/2024 and MRI 01/15/2024 FINDINGS: Brain: There is no evidence of an acute infarct, intracranial hemorrhage, mass, midline shift, or extra-axial fluid collection. There is moderate cerebral atrophy. Patchy cerebral white matter hypodensities are similar to the are CT and are nonspecific but compatible with moderate chronic small vessel ischemic disease. Vascular: Atherosclerotic calcification.  No hyperdense vessel. Skull: No fracture or suspicious lesion. Sinuses/Orbits: Increased left sphenoid sinus fluid. Clear mastoid air cells. Bilateral cataract extraction. Other: None. IMPRESSION: 1. No evidence of acute intracranial abnormality. 2. Moderate chronic small vessel ischemic disease and cerebral atrophy. Electronically Signed   By: Dasie Hamburg M.D.   On: 03/01/2024 13:51   DG Chest Portable 1 View Result Date: 02/26/2024 CLINICAL DATA:  Low oxygen EXAM: PORTABLE CHEST 1 VIEW COMPARISON:  Chest x-ray 12/12/2023 FINDINGS: Patient is slightly rotated. There is prominence of soft tissues in the right paratracheal region. This may be accentuated by patient rotation, but mediastinal process is not excluded. Heart size is normal. The lungs are clear. There is no pleural effusion or pneumothorax. No acute fractures are identified. IMPRESSION: Prominence of soft tissues in the right paratracheal region. This may be accentuated by patient rotation, but mediastinal process is not excluded. Recommend further evaluation with PA and lateral chest x-ray. Electronically Signed   By: Greig Pique M.D.   On: 02/26/2024 18:33    Microbiology: Results for orders placed or performed during the hospital encounter of 02/26/24   Urine Culture     Status: Abnormal   Collection Time: 02/26/24  5:16 PM   Specimen: Urine, Clean Catch  Result Value Ref Range Status   Specimen Description   Final    URINE, CLEAN CATCH Performed at [facility], [address] [place], [place] [address]    Special Requests   Final    NONE Performed at [facility], [address] [place], [place] [address]    Culture 30,000 COLONIES/mL PSEUDOMONAS AERUGINOSA (A)  Final   Report Status 02/29/2024 FINAL  Final   Organism ID, Bacteria PSEUDOMONAS AERUGINOSA (A)  Final      Susceptibility   Pseudomonas aeruginosa - MIC*    MEROPENEM 0.5 SENSITIVE Sensitive     CIPROFLOXACIN  <=0.06 SENSITIVE Sensitive     IMIPENEM <=0.5 SENSITIVE Sensitive     PIP/TAZO Value in next row Sensitive ug/mL     8 SENSITIVEThis is a modified FDA-approved test that has been validated and its performance characteristics determined by the reporting laboratory.  This laboratory is certified under the Clinical Laboratory Improvement Amendments CLIA as qualified to perform high complexity clinical laboratory testing.    CEFEPIME  Value  in next row Sensitive      8 SENSITIVEThis is a modified FDA-approved test that has been validated and its performance characteristics determined by the reporting laboratory.  This laboratory is certified under the Clinical Laboratory Improvement Amendments CLIA as qualified to perform high complexity clinical laboratory testing.    CEFTAZIDIME/AVIBACTAM Value in next row Sensitive ug/mL     8 SENSITIVEThis is a modified FDA-approved test that has been validated and its performance characteristics determined by the reporting laboratory.  This laboratory is certified under the Clinical Laboratory Improvement Amendments CLIA as qualified to perform high complexity clinical laboratory testing.    CEFTOLOZANE/TAZOBACTAM Value in next row Sensitive ug/mL     8 SENSITIVEThis is a modified FDA-approved test that has been validated and its  performance characteristics determined by the reporting laboratory.  This laboratory is certified under the Clinical Laboratory Improvement Amendments CLIA as qualified to perform high complexity clinical laboratory testing.    * 30,000 COLONIES/mL PSEUDOMONAS AERUGINOSA  Blood Culture (routine x 2)     Status: None   Collection Time: 02/26/24  6:19 PM   Specimen: BLOOD LEFT FOREARM  Result Value Ref Range Status   Specimen Description BLOOD LEFT FOREARM  Final   Special Requests NONE  Final   Culture   Final    NO GROWTH 5 DAYS Performed at [facility], [address], [place], [place] [address]    Report Status 03/02/2024 FINAL  Final  Resp panel by RT-PCR (RSV, Flu A&B, Covid) Anterior Nasal Swab     Status: None   Collection Time: 02/26/24  6:20 PM   Specimen: Anterior Nasal Swab  Result Value Ref Range Status   SARS Coronavirus 2 by RT PCR NEGATIVE NEGATIVE Final    Comment: (NOTE) SARS-CoV-2 target nucleic acids are NOT DETECTED.  The SARS-CoV-2 RNA is generally detectable in upper respiratory specimens during the acute phase of infection. The lowest concentration of SARS-CoV-2 viral copies this assay can detect is 138 copies/mL. A negative result does not preclude SARS-Cov-2 infection and should not be used as the sole basis for treatment or other patient management decisions. A negative result may occur with  improper specimen collection/handling, submission of specimen other than nasopharyngeal swab, presence of viral mutation(s) within the areas targeted by this assay, and inadequate number of viral copies(<138 copies/mL). A negative result must be combined with clinical observations, patient history, and epidemiological information. The expected result is Negative.  Fact Sheet for Patients:  BloggerCourse.com  Fact Sheet for Healthcare Providers:  SeriousBroker.it  This test is no t yet approved or cleared by the  [place]  FDA and  has been authorized for detection and/or diagnosis of SARS-CoV-2 by FDA under an Emergency Use Authorization (EUA). This EUA will remain  in effect (meaning this test can be used) for the duration of the COVID-19 declaration under Section 564(b)(1) of the Act, 21 U.S.C.section 360bbb-3(b)(1), unless the authorization is terminated  or revoked sooner.       Influenza A by PCR NEGATIVE NEGATIVE Final   Influenza B by PCR NEGATIVE NEGATIVE Final    Comment: (NOTE) The Xpert Xpress SARS-CoV-2/FLU/RSV plus assay is intended as an aid in the diagnosis of influenza from Nasopharyngeal swab specimens and should not be used as a sole basis for treatment. Nasal washings and aspirates are unacceptable for Xpert Xpress SARS-CoV-2/FLU/RSV testing.  Fact Sheet for Patients: BloggerCourse.com  Fact Sheet for Healthcare Providers: SeriousBroker.it  This test is not yet approved or cleared by  the [place]  FDA and has been authorized for detection and/or diagnosis of SARS-CoV-2 by FDA under an Emergency Use Authorization (EUA). This EUA will remain in effect (meaning this test can be used) for the duration of the COVID-19 declaration under Section 564(b)(1) of the Act, 21 U.S.C. section 360bbb-3(b)(1), unless the authorization is terminated or revoked.     Resp Syncytial Virus by PCR NEGATIVE NEGATIVE Final    Comment: (NOTE) Fact Sheet for Patients: BloggerCourse.com  Fact Sheet for Healthcare Providers: SeriousBroker.it  This test is not yet approved or cleared by the [place]  FDA and has been authorized for detection and/or diagnosis of SARS-CoV-2 by FDA under an Emergency Use Authorization (EUA). This EUA will remain in effect (meaning this test can be used) for the duration of the COVID-19 declaration under Section 564(b)(1) of the Act, 21 U.S.C. section  360bbb-3(b)(1), unless the authorization is terminated or revoked.  Performed at [facility], [address] [place], [place] [address]   Blood Culture (routine x 2)     Status: Abnormal   Collection Time: 02/26/24  6:27 PM   Specimen: Left Antecubital; Blood  Result Value Ref Range Status   Specimen Description   Final    LEFT ANTECUBITAL Performed at [facility], [address], [place], [place] [address]    Special Requests   Final    NONE Performed at [facility], [address], [place], [place] [address]    Culture  Setup Time   Final    GRAM POSITIVE COCCI ANAEROBIC BOTTLE ONLY Gram Stain Report Called to,Read Back By and Verified With: JOHN M. ON 02/27/2024 @4 :18PM BY T. HAMER  CRITICAL RESULT CALLED TO, READ BACK BY AND VERIFIED WITH: RN ASHLEY H 0110 [id number] FCP    Culture (A)  Final    STAPHYLOCOCCUS EPIDERMIDIS THE SIGNIFICANCE OF ISOLATING THIS ORGANISM FROM A SINGLE SET OF BLOOD CULTURES WHEN MULTIPLE SETS ARE DRAWN IS UNCERTAIN. PLEASE NOTIFY THE MICROBIOLOGY DEPARTMENT WITHIN ONE WEEK IF SPECIATION AND SENSITIVITIES ARE REQUIRED. Performed at [facility], [address] [place], [place] [address]    Report Status 02/29/2024 FINAL  Final  Blood Culture ID Panel (Reflexed)     Status: Abnormal   Collection Time: 02/26/24  6:27 PM  Result Value Ref Range Status   Enterococcus faecalis NOT DETECTED NOT DETECTED Final   Enterococcus Faecium NOT DETECTED NOT DETECTED Final   Listeria monocytogenes NOT DETECTED NOT DETECTED Final   Staphylococcus species DETECTED (A) NOT DETECTED Final    Comment: CRITICAL RESULT CALLED TO, READ BACK BY AND VERIFIED WITH: RN ASHLEY H 0110 [id number] FCP    Staphylococcus aureus (BCID) NOT DETECTED NOT DETECTED Final   Staphylococcus epidermidis DETECTED (A) NOT DETECTED Final    Comment: Methicillin (oxacillin) resistant coagulase negative staphylococcus. Possible blood culture contaminant (unless isolated from more than one blood  culture draw or clinical case suggests pathogenicity). No antibiotic treatment is indicated for blood  culture contaminants. CRITICAL RESULT CALLED TO, READ BACK BY AND VERIFIED WITH: RN ASHLEY H 0110 [id number] FCP    Staphylococcus lugdunensis NOT DETECTED NOT DETECTED Final   Streptococcus species NOT DETECTED NOT DETECTED Final   Streptococcus agalactiae NOT DETECTED NOT DETECTED Final   Streptococcus pneumoniae NOT DETECTED NOT DETECTED Final   Streptococcus pyogenes NOT DETECTED NOT DETECTED Final   A.calcoaceticus-baumannii NOT DETECTED NOT DETECTED Final   Bacteroides fragilis NOT DETECTED NOT DETECTED Final   Enterobacterales NOT DETECTED NOT DETECTED Final   Enterobacter  cloacae complex NOT DETECTED NOT DETECTED Final   Escherichia coli NOT DETECTED NOT DETECTED Final   Klebsiella aerogenes NOT DETECTED NOT DETECTED Final   Klebsiella oxytoca NOT DETECTED NOT DETECTED Final   Klebsiella pneumoniae NOT DETECTED NOT DETECTED Final   Proteus species NOT DETECTED NOT DETECTED Final   Salmonella species NOT DETECTED NOT DETECTED Final   Serratia marcescens NOT DETECTED NOT DETECTED Final   Haemophilus influenzae NOT DETECTED NOT DETECTED Final   Neisseria meningitidis NOT DETECTED NOT DETECTED Final   Pseudomonas aeruginosa NOT DETECTED NOT DETECTED Final   Stenotrophomonas maltophilia NOT DETECTED NOT DETECTED Final   Candida albicans NOT DETECTED NOT DETECTED Final   Candida auris NOT DETECTED NOT DETECTED Final   Candida glabrata NOT DETECTED NOT DETECTED Final   Candida krusei NOT DETECTED NOT DETECTED Final   Candida parapsilosis NOT DETECTED NOT DETECTED Final   Candida tropicalis NOT DETECTED NOT DETECTED Final   Cryptococcus neoformans/gattii NOT DETECTED NOT DETECTED Final   Methicillin resistance mecA/C DETECTED (A) NOT DETECTED Final    Comment: CRITICAL RESULT CALLED TO, READ BACK BY AND VERIFIED WITH: RN ROSINA VEAR SPIKE [id number] FCP Performed at [facility],  [address] [place], [place] [address]     Labs: CBC: Recent Labs  Lab 03/16/24 2043 03/18/24 0421  WBC 7.7 7.1  NEUTROABS 5.3  --   HGB 14.4 12.8  HCT 43.6 39.6  MCV 92.8 94.3  PLT 301 286   Basic Metabolic Panel: Recent Labs  Lab 03/16/24 2253 03/17/24 0121 03/18/24 0421  NA 136  --  137  K 4.3  --  4.0  CL 96*  --  101  CO2 21*  --  28  GLUCOSE 99  --  81  BUN 8  --  9  CREATININE 0.58  --  0.59  CALCIUM 9.6  --  9.3  MG  --  1.9 1.8  PHOS  --  3.1 4.1   Liver Function Tests: Recent Labs  Lab 03/16/24 2253 03/18/24 0421  AST 29 15  ALT 10 10  ALKPHOS 77 66  BILITOT 0.3 0.4  PROT 6.5 5.6*  ALBUMIN 3.5 3.3*   CBG: Recent Labs  Lab 03/16/24 2102  GLUCAP 103*    Discharge time spent: greater than 30 minutes.  Signed: Alm Schneider, MD [facility] 03/18/2024

## 2024-03-18 NOTE — TOC Transition Note (Signed)
 Transition of Care [facility]) - Discharge Note   Patient Details  Name: Donna Barron MRN: [id number] Date of Birth: 08-28-1940  [facility] CM/SW Contact:  Lucie Lunger, LCSWA Phone Number: 03/18/2024, 9:41 AM   Clinical Narrative:    CSW updated that [facility] PT/OT orders were placed by MD. CSW spoke to pts daughter [name] who states pt is active with [facility] services at this time. CSW reached out to [facility] rep Darleene to provide update on plan for D/C home today. TOC signing off.   Final next level of care: Home w Home Health Services Barriers to Discharge: Barriers Resolved   Patient Goals and CMS Choice Patient states their goals for this hospitalization and ongoing recovery are:: return home CMS Medicare.gov Compare Post Acute Care list provided to:: Patient Represenative (must comment) Choice offered to / list presented to : Adult Children      Discharge Placement                  Name of family member notified: [name] Patient and family notified of of transfer: 03/18/24  Discharge Plan and Services Additional resources added to the After Visit Summary for   In-house Referral: Clinical Social Work Discharge Planning Services: CM Consult Post Acute Care Choice: Durable Medical Equipment                    HH Arranged: PT, OT HH Agency: [facility] Date [facility] Agency Contacted: 03/18/24   Representative spoke with at [facility] Agency: Darleene  Social Drivers of Health (SDOH) Interventions SDOH Screenings   Food Insecurity: Patient Unable To Answer (03/17/2024)  Housing: Unknown (03/17/2024)  Transportation Needs: Patient Unable To Answer (03/17/2024)  Recent Concern: Transportation Needs - Unmet Transportation Needs (03/14/2024)   Received from [facility]  Utilities: Patient Unable To Answer (03/17/2024)  Financial Resource Strain: Low Risk  (03/14/2024)   Received from [facility]  Physical Activity: Inactive (03/14/2024)   Received from [facility]  Social Connections: Patient Unable To Answer (03/17/2024)  Recent Concern: Social Connections - Socially Isolated (03/14/2024)   Received from [facility]  Stress: No Stress Concern Present (03/14/2024)   Received from [facility]  Tobacco Use: Low Risk  (03/16/2024)     Readmission Risk Interventions    02/28/2024   10:31 AM 10/13/2023    9:11 PM  Readmission Risk Prevention Plan  Post Dischage Appt  Complete  Medication Screening  Complete  Transportation Screening Complete Complete  PCP or Specialist Appt within 5-7 Days Not Complete   Home Care Screening Complete   Medication Review (RN CM) Complete

## 2024-03-18 NOTE — Progress Notes (Signed)
 Daughter contacted and updated regarding discharge orders. Secretary called EMS for transportation.

## 2024-03-18 NOTE — Plan of Care (Signed)

## 2024-03-18 NOTE — Plan of Care (Signed)

## 2024-03-19 ENCOUNTER — Observation Stay (HOSPITAL_COMMUNITY): Admit: 2024-03-19 | Discharge: 2024-03-19 | Disposition: A | Attending: Internal Medicine

## 2024-03-19 DIAGNOSIS — I480 Paroxysmal atrial fibrillation: Secondary | ICD-10-CM | POA: Diagnosis not present

## 2024-03-19 DIAGNOSIS — G4089 Other seizures: Secondary | ICD-10-CM | POA: Diagnosis not present

## 2024-03-19 DIAGNOSIS — I10 Essential (primary) hypertension: Secondary | ICD-10-CM | POA: Diagnosis not present

## 2024-03-19 DIAGNOSIS — R569 Unspecified convulsions: Secondary | ICD-10-CM | POA: Diagnosis not present

## 2024-03-19 DIAGNOSIS — E66812 Obesity, class 2: Secondary | ICD-10-CM | POA: Diagnosis not present

## 2024-03-19 NOTE — Progress Notes (Signed)
 Tech notified me of soft BP, 99/47 (63). Rechecked BP manually, 98/44. MD Adefeso made aware who ordered a LR Bolus of , 250 mL/hr. 22G IV put in Left; Posterior Forearm; tolerated well. Scheduled Metoprolol  12.5mg  not given, per MD Adefeso. Vitals rechecked at 0134 after bolus: Temp: 97.6 (Oral), BP: 109/48 (66), Pulse 70, RR 16, SPO2 94, in no pain. MD Manfred made aware.

## 2024-03-19 NOTE — Progress Notes (Signed)
 EEG complete - results pending

## 2024-03-19 NOTE — Discharge Summary (Signed)
 Physician Discharge Summary   Patient: Donna Barron MRN: [id number] DOB: 1940-07-02  Admit date:     03/16/2024  Discharge date: 03/19/24  Discharge Physician: Alm Anderson [place]   PCP: [name], NP   Recommendations at discharge:   Please follow up with primary care provider within 1-2 weeks  Please repeat BMP and CBC in one week      Hospital Course: 83 year old female with a history of depression, dementia, paroxysmal atrial fibrillation, seizure disorder, GERD, hypertension, hyperlipidemia presenting with a breakthrough seizure.  The patient is unable to provide any significant history secondary to her cognitive impairment.  She was recently hospitalized from 02/26/2024 to 03/05/2024 for altered mental status.  She was treated for  sepsis secondary to Pseudomonas UTI.  During that hospitalization, the patient had waxing and waning episodes of mental status but no overt seizures.  Her antiepileptic medications were adjusted after discussion with the patient's outpatient neurologist, Dr. Darice Shivers.  Her Trileptal  was increased to 300 mg twice daily, and her Depakote  was decreased to 125 mg a.m., 250 mg p.m. secondary to hyperammonemia.  She was subsequently discharged to SNF.    Patient's daughter at the bedside supplements the history.  Daughter states that the patient showed minimal improvement with her functionality during her SNF stay.  She states that the patient was able to get out of bed with assistance.  Patient was able to take 1 or 2 steps with a walker.  In fact, daughter states that they were not able to take the patient home by private vehicle because patient still had difficulty getting in and out of the car.  [facility] transported the patient home in the late morning 03/16/2024.  Around 7 PM on 03/16/2024, daughter noted the patient to be somnolent with sonorous respirations.  Daughter actually brought a video on her smart phone which showed the patient to have a  left gaze preference and unresponsive to tactile stimuli.  Daughter stated that this episode lasted about 4 minutes.  EMS was activated and the patient was brought to emergency department for further evaluation.  In the ED, the patient remained postictal and somnolent.  She was afebrile and hemodynamically stable.  Oxygen saturation was 99% on room air.  Due to her prolonged postictal state, she was admitted for further evaluation and treatment.  Teleneuro was consulted and the patient was loaded with Keppra 1 g with recommendations to continue 500 mg twice daily.  In the morning of 03/17/2024 the patient was sleeping when I entered the room, but she wakes up easily to voice.  She followed commands appropriately.  She was able to carry on a conversation with fluent speech.  She was pleasantly confused, but was not much different than during her waxing and waning episodes during her prior hospitalization.  Lab work showed WBC 7.7, hemoglobin 14.4, platelet 301.  BMP showed sodium 136, potassium 4.3, bicarbonate 21, serum creatinine 0.58.  AST 29, ALT 10, alk phosphatase 77, total bilirubin 0.3.  CPK 40.  EKG showed sinus rhythm without any state ST-T wave changes.  UA was negative for pyuria.  VPA level 22.  CTA head and neck was negative for LVO or hemodynamically significant stenosis of the carotids or proximal vessels.  Chest x-ray was negative for infiltrates or effusions.  On 03/17/2024, the patient was awake and conversant, but with slightly somnolent.  The patient was monitored for 24 hours.  She was given IV fluids.  She was continued on Keppra  500 mg twice daily.  Repeat CMP and CBC on 03/18/2024 were unremarkable.  The patient's mental status was back to baseline.  She was conversant with fluent speech and following commands.  The patient was discharged home with her daughter Donna Barron as her primary caregiver.  Donna Barron is currently in the process of trying to get the patient placed permanently for long-term  care. Discharge was delayed until 03/19/24 due to lack and unavailable transport.  Assessment and Plan:  Breakthrough seizure -Continue Keppra 500 mg po bid - Continue home doses of Depakote  and Trileptal  - Patient will need follow-up with her outpatient neurologist, Dr. Georjean - 03/17/2024--patient's mental status is near her usual baseline - UA negative for pyuria - 03/18/24 AM--mental status back to baseline--A&Ox2, conversant, fluent speech, following commands - 03/19/24 AM--mental status stable at baseline--A&O x 2, remains conversant with fluent speech and following commands   Major neurocognitive disorder - Continue Depakote  and Lexapro    Essential hypertension - Continue metoprolol  at lower dose   Paroxysmal atrial fibrillation - Continue metoprolol  and apixaban    Class II obesity - BMI 36.03 - Lifestyle modification   Mixed hyperlipidemia - Continue statin   Goals of care - 03/17/2024--discussed with daughter at bedside--confirmed DNR      Consultants: none Procedures performed: none  Disposition: Home Diet recommendation:  Regular diet DISCHARGE MEDICATION: Allergies as of 03/19/2024       Reactions   Codeine  Swelling   Swelling of lips   Hydrocodone  Swelling   Lip swelling   Avelox [moxifloxacin Hcl In Nacl] Other (See Comments)   Insomnia    Oxybutynin Palpitations, Other (See Comments)   Tongue felt coated         Medication List     STOP taking these medications    potassium chloride  SA 20 MEQ tablet Commonly known as: KLOR-CON  M       TAKE these medications    apixaban  5 MG Tabs tablet Commonly known as: Eliquis  Take 1 tablet (5 mg total) by mouth 2 (two) times daily.   ascorbic acid  500 MG tablet Commonly known as: VITAMIN C  Take 1 tablet (500 mg total) by mouth daily.   conjugated estrogens  0.625 MG/GM vaginal cream Commonly known as: PREMARIN  Place 0.25 Applicatorfuls vaginally daily.   divalproex  125 MG capsule Commonly  known as: DEPAKOTE  SPRINKLE Take 2 capsules (250 mg total) by mouth at bedtime.   divalproex  125 MG capsule Commonly known as: DEPAKOTE  SPRINKLE Take 1 capsule (125 mg total) by mouth every morning.   Dulcolax 5 MG EC tablet Generic drug: bisacodyl  Take 1 tablet (5 mg total) by mouth daily as needed for moderate constipation.   escitalopram  20 MG tablet Commonly known as: LEXAPRO  Take 1 tablet (20 mg total) by mouth at bedtime.   folic acid  1 MG tablet Commonly known as: FOLVITE  Take 1 tablet (1 mg total) by mouth daily.   levETIRAcetam 500 MG tablet Commonly known as: KEPPRA Take 1 tablet (500 mg total) by mouth 2 (two) times daily.   melatonin 5 MG Tabs Take 5 mg by mouth at bedtime.   metoprolol  tartrate 25 MG tablet Commonly known as: LOPRESSOR  Take 0.5 tablets (12.5 mg total) by mouth 2 (two) times daily. What changed: how much to take   nitrofurantoin  (macrocrystal-monohydrate) 100 MG capsule Commonly known as: Macrobid  Take 1 capsule (100 mg total) by mouth at bedtime.   nystatin  ointment Commonly known as: MYCOSTATIN  Apply 1 Application topically 2 (two) times daily as needed.  omeprazole  40 MG capsule Commonly known as: PRILOSEC Take 1 capsule (40 mg total) by mouth daily.   Oxcarbazepine  300 MG tablet Commonly known as: TRILEPTAL  Take 1 tablet (300 mg total) by mouth 2 (two) times daily.   simvastatin  40 MG tablet Commonly known as: ZOCOR  Take 1 tablet (40 mg total) by mouth at bedtime.   vitamin B-12 500 MCG tablet Commonly known as: CYANOCOBALAMIN  Take 500 mcg by mouth daily.   Vitamin D  (Ergocalciferol ) 1.25 MG (50000 UNIT) Caps capsule Commonly known as: DRISDOL  Take 1 capsule (50,000 Units total) by mouth once a week.        Discharge Exam: Filed Weights   03/16/24 2018 03/17/24 0501  Weight: 86.1 kg 86.5 kg   HEENT:  [place]/AT, No thrush, no icterus CV:  RRR, no rub, no S3, no S4 Lung:  bibasilar crackles. No wheeze Abd:  soft/+BS,  NT Ext:  1 + LE edema, no lymphangitis, no synovitis, no rash   Condition at discharge: stable  The results of significant diagnostics from this hospitalization (including imaging, microbiology, ancillary and laboratory) are listed below for reference.   Imaging Studies: CT ANGIO HEAD NECK W WO CM Result Date: 03/17/2024 EXAM: CTA Head and Neck with Intravenous Contrast. CT Head without Contrast. CLINICAL HISTORY: AMS, post-ictal, seizure. Discharged today for seizure. Patient had a seizure at home, presented with post-ictal staring to the left and snoring respirations, now back to baseline. History of dementia. TECHNIQUE: Axial CTA images of the head and neck performed with and without intravenous contrast. MIP reconstructed images were created and reviewed. Axial computed tomography images of the head/brain performed without intravenous contrast. Note: Per PQRS, the description of internal carotid artery percent stenosis, including 0 percent or normal exam, is based on [place] American Symptomatic Carotid Endarterectomy Trial (NASCET) criteria. Dose reduction technique was used including one or more of the following: automated exposure control, adjustment of mA and kV according to patient size, and/or iterative reconstruction. CONTRAST: With and without; 75 mL iohexol  350 mg/mL injection. COMPARISON: 03/01/2024 FINDINGS: CT HEAD: BRAIN: No acute intraparenchymal hemorrhage. No mass lesion. No CT evidence for acute territorial infarct. No midline shift or extra-axial collection. VENTRICLES: No hydrocephalus. ORBITS: The orbits are unremarkable. SINUSES AND MASTOIDS: The paranasal sinuses and mastoid air cells are clear. CTA NECK: COMMON CAROTID ARTERIES: Mild bilateral carotid bifurcation atherosclerosis, right greater than left, without hemodynamically significant stenosis. No dissection or occlusion. INTERNAL CAROTID ARTERIES: No stenosis by NASCET criteria. No dissection or occlusion. VERTEBRAL  ARTERIES: Mild narrowing of the right vertebral artery origin. Mild calcific atherosclerosis of the left vertebral artery V4 segment without stenosis. No dissection or occlusion. CTA HEAD: ANTERIOR CEREBRAL ARTERIES: No significant stenosis. No occlusion. No aneurysm. MIDDLE CEREBRAL ARTERIES: No significant stenosis. No occlusion. No aneurysm. POSTERIOR CEREBRAL ARTERIES: No significant stenosis. No occlusion. No aneurysm. BASILAR ARTERY: No significant stenosis. No occlusion. No aneurysm. OTHER: Calcific atherosclerosis of the aortic arch. SOFT TISSUES: No acute finding. No masses or lymphadenopathy. BONES: No acute osseous abnormality. IMPRESSION: 1. No acute intracranial abnormality or emergent large vessel occlusion. 2. Mild bilateral carotid bifurcation atherosclerosis, right greater than left, without hemodynamically significant stenosis. 3. Mild narrowing of the right vertebral artery origin and mild calcific atherosclerosis of the left vertebral artery V4 segment without stenosis. Electronically signed by: Franky Stanford MD 03/17/2024 02:15 AM EDT RP Workstation: [id number]   DG Chest Portable 1 View Result Date: 03/16/2024 CLINICAL DATA:  Status post seizure. EXAM: PORTABLE CHEST 1 VIEW COMPARISON:  February 26, 2024 FINDINGS: The heart size and mediastinal contours are within normal limits. Low lung volumes are noted with subsequent crowding of the bronchovascular lung markings. No acute infiltrate, pleural effusion or pneumothorax is identified. Radiopaque surgical clips are seen overlying the right upper quadrant. The visualized skeletal structures are unremarkable. IMPRESSION: Low lung volumes without acute or active cardiopulmonary disease. Electronically Signed   By: Suzen Dials M.D.   On: 03/16/2024 23:49   CT HEAD WO CONTRAST ( ) Result Date: 03/01/2024 CLINICAL DATA:  Mental status change, unknown cause. EXAM: CT HEAD WITHOUT CONTRAST TECHNIQUE: Contiguous axial images were obtained  from the base of the skull through the vertex without intravenous contrast. RADIATION DOSE REDUCTION: This exam was performed according to the departmental dose-optimization program which includes automated exposure control, adjustment of the mA and/or kV according to patient size and/or use of iterative reconstruction technique. COMPARISON:  Head CT 01/14/2024 and MRI 01/15/2024 FINDINGS: Brain: There is no evidence of an acute infarct, intracranial hemorrhage, mass, midline shift, or extra-axial fluid collection. There is moderate cerebral atrophy. Patchy cerebral white matter hypodensities are similar to the are CT and are nonspecific but compatible with moderate chronic small vessel ischemic disease. Vascular: Atherosclerotic calcification.  No hyperdense vessel. Skull: No fracture or suspicious lesion. Sinuses/Orbits: Increased left sphenoid sinus fluid. Clear mastoid air cells. Bilateral cataract extraction. Other: None. IMPRESSION: 1. No evidence of acute intracranial abnormality. 2. Moderate chronic small vessel ischemic disease and cerebral atrophy. Electronically Signed   By: Dasie Hamburg M.D.   On: 03/01/2024 13:51   DG Chest Portable 1 View Result Date: 02/26/2024 CLINICAL DATA:  Low oxygen EXAM: PORTABLE CHEST 1 VIEW COMPARISON:  Chest x-ray 12/12/2023 FINDINGS: Patient is slightly rotated. There is prominence of soft tissues in the right paratracheal region. This may be accentuated by patient rotation, but mediastinal process is not excluded. Heart size is normal. The lungs are clear. There is no pleural effusion or pneumothorax. No acute fractures are identified. IMPRESSION: Prominence of soft tissues in the right paratracheal region. This may be accentuated by patient rotation, but mediastinal process is not excluded. Recommend further evaluation with PA and lateral chest x-ray. Electronically Signed   By: Greig Pique M.D.   On: 02/26/2024 18:33    Microbiology: Results for orders placed or  performed during the hospital encounter of 02/26/24  Urine Culture     Status: Abnormal   Collection Time: 02/26/24  5:16 PM   Specimen: Urine, Clean Catch  Result Value Ref Range Status   Specimen Description   Final    URINE, CLEAN CATCH Performed at [facility], [address], [place], [place] [address]    Special Requests   Final    NONE Performed at [facility], [address] [place], [place] [address]    Culture 30,000 COLONIES/mL PSEUDOMONAS AERUGINOSA (A)  Final   Report Status 02/29/2024 FINAL  Final   Organism ID, Bacteria PSEUDOMONAS AERUGINOSA (A)  Final      Susceptibility   Pseudomonas aeruginosa - MIC*    MEROPENEM 0.5 SENSITIVE Sensitive     CIPROFLOXACIN  <=0.06 SENSITIVE Sensitive     IMIPENEM <=0.5 SENSITIVE Sensitive     PIP/TAZO Value in next row Sensitive ug/mL     8 SENSITIVEThis is a modified FDA-approved test that has been validated and its performance characteristics determined by the reporting laboratory.  This laboratory is certified under the Clinical Laboratory Improvement Amendments CLIA as qualified to perform high complexity clinical laboratory testing.  CEFEPIME  Value in next row Sensitive      8 SENSITIVEThis is a modified FDA-approved test that has been validated and its performance characteristics determined by the reporting laboratory.  This laboratory is certified under the Clinical Laboratory Improvement Amendments CLIA as qualified to perform high complexity clinical laboratory testing.    CEFTAZIDIME/AVIBACTAM Value in next row Sensitive ug/mL     8 SENSITIVEThis is a modified FDA-approved test that has been validated and its performance characteristics determined by the reporting laboratory.  This laboratory is certified under the Clinical Laboratory Improvement Amendments CLIA as qualified to perform high complexity clinical laboratory testing.    CEFTOLOZANE/TAZOBACTAM Value in next row Sensitive ug/mL     8 SENSITIVEThis is a modified  FDA-approved test that has been validated and its performance characteristics determined by the reporting laboratory.  This laboratory is certified under the Clinical Laboratory Improvement Amendments CLIA as qualified to perform high complexity clinical laboratory testing.    * 30,000 COLONIES/mL PSEUDOMONAS AERUGINOSA  Blood Culture (routine x 2)     Status: None   Collection Time: 02/26/24  6:19 PM   Specimen: BLOOD LEFT FOREARM  Result Value Ref Range Status   Specimen Description BLOOD LEFT FOREARM  Final   Special Requests NONE  Final   Culture   Final    NO GROWTH 5 DAYS Performed at [facility], [address], [place], [place] [address]    Report Status 03/02/2024 FINAL  Final  Resp panel by RT-PCR (RSV, Flu A&B, Covid) Anterior Nasal Swab     Status: None   Collection Time: 02/26/24  6:20 PM   Specimen: Anterior Nasal Swab  Result Value Ref Range Status   SARS Coronavirus 2 by RT PCR NEGATIVE NEGATIVE Final    Comment: (NOTE) SARS-CoV-2 target nucleic acids are NOT DETECTED.  The SARS-CoV-2 RNA is generally detectable in upper respiratory specimens during the acute phase of infection. The lowest concentration of SARS-CoV-2 viral copies this assay can detect is 138 copies/mL. A negative result does not preclude SARS-Cov-2 infection and should not be used as the sole basis for treatment or other patient management decisions. A negative result may occur with  improper specimen collection/handling, submission of specimen other than nasopharyngeal swab, presence of viral mutation(s) within the areas targeted by this assay, and inadequate number of viral copies(<138 copies/mL). A negative result must be combined with clinical observations, patient history, and epidemiological information. The expected result is Negative.  Fact Sheet for Patients:  BloggerCourse.com  Fact Sheet for Healthcare Providers:   SeriousBroker.it  This test is no t yet approved or cleared by the [place]  FDA and  has been authorized for detection and/or diagnosis of SARS-CoV-2 by FDA under an Emergency Use Authorization (EUA). This EUA will remain  in effect (meaning this test can be used) for the duration of the COVID-19 declaration under Section 564(b)(1) of the Act, 21 U.S.C.section 360bbb-3(b)(1), unless the authorization is terminated  or revoked sooner.       Influenza A by PCR NEGATIVE NEGATIVE Final   Influenza B by PCR NEGATIVE NEGATIVE Final    Comment: (NOTE) The Xpert Xpress SARS-CoV-2/FLU/RSV plus assay is intended as an aid in the diagnosis of influenza from Nasopharyngeal swab specimens and should not be used as a sole basis for treatment. Nasal washings and aspirates are unacceptable for Xpert Xpress SARS-CoV-2/FLU/RSV testing.  Fact Sheet for Patients: BloggerCourse.com  Fact Sheet for Healthcare Providers: SeriousBroker.it  This test is not yet approved or  cleared by the [place]  FDA and has been authorized for detection and/or diagnosis of SARS-CoV-2 by FDA under an Emergency Use Authorization (EUA). This EUA will remain in effect (meaning this test can be used) for the duration of the COVID-19 declaration under Section 564(b)(1) of the Act, 21 U.S.C. section 360bbb-3(b)(1), unless the authorization is terminated or revoked.     Resp Syncytial Virus by PCR NEGATIVE NEGATIVE Final    Comment: (NOTE) Fact Sheet for Patients: BloggerCourse.com  Fact Sheet for Healthcare Providers: SeriousBroker.it  This test is not yet approved or cleared by the [place]  FDA and has been authorized for detection and/or diagnosis of SARS-CoV-2 by FDA under an Emergency Use Authorization (EUA). This EUA will remain in effect (meaning this test can be used) for  the duration of the COVID-19 declaration under Section 564(b)(1) of the Act, 21 U.S.C. section 360bbb-3(b)(1), unless the authorization is terminated or revoked.  Performed at [facility], [address], [place], [place] [address]   Blood Culture (routine x 2)     Status: Abnormal   Collection Time: 02/26/24  6:27 PM   Specimen: Left Antecubital; Blood  Result Value Ref Range Status   Specimen Description   Final    LEFT ANTECUBITAL Performed at [facility], [address] [place], [place] [address]    Special Requests   Final    NONE Performed at [facility], [address], [place], [place] [address]    Culture  Setup Time   Final    GRAM POSITIVE COCCI ANAEROBIC BOTTLE ONLY Gram Stain Report Called to,Read Back By and Verified With: JOHN M. ON 02/27/2024 @4 :18PM BY T. HAMER  CRITICAL RESULT CALLED TO, READ BACK BY AND VERIFIED WITH: RN ASHLEY H 0110 [id number] FCP    Culture (A)  Final    STAPHYLOCOCCUS EPIDERMIDIS THE SIGNIFICANCE OF ISOLATING THIS ORGANISM FROM A SINGLE SET OF BLOOD CULTURES WHEN MULTIPLE SETS ARE DRAWN IS UNCERTAIN. PLEASE NOTIFY THE MICROBIOLOGY DEPARTMENT WITHIN ONE WEEK IF SPECIATION AND SENSITIVITIES ARE REQUIRED. Performed at [facility], [address] [place], [place] [address]    Report Status 02/29/2024 FINAL  Final  Blood Culture ID Panel (Reflexed)     Status: Abnormal   Collection Time: 02/26/24  6:27 PM  Result Value Ref Range Status   Enterococcus faecalis NOT DETECTED NOT DETECTED Final   Enterococcus Faecium NOT DETECTED NOT DETECTED Final   Listeria monocytogenes NOT DETECTED NOT DETECTED Final   Staphylococcus species DETECTED (A) NOT DETECTED Final    Comment: CRITICAL RESULT CALLED TO, READ BACK BY AND VERIFIED WITH: RN ASHLEY H 0110 [id number] FCP    Staphylococcus aureus (BCID) NOT DETECTED NOT DETECTED Final   Staphylococcus epidermidis DETECTED (A) NOT DETECTED Final    Comment: Methicillin (oxacillin) resistant coagulase  negative staphylococcus. Possible blood culture contaminant (unless isolated from more than one blood culture draw or clinical case suggests pathogenicity). No antibiotic treatment is indicated for blood  culture contaminants. CRITICAL RESULT CALLED TO, READ BACK BY AND VERIFIED WITH: RN ASHLEY H 0110 [id number] FCP    Staphylococcus lugdunensis NOT DETECTED NOT DETECTED Final   Streptococcus species NOT DETECTED NOT DETECTED Final   Streptococcus agalactiae NOT DETECTED NOT DETECTED Final   Streptococcus pneumoniae NOT DETECTED NOT DETECTED Final   Streptococcus pyogenes NOT DETECTED NOT DETECTED Final   A.calcoaceticus-baumannii NOT DETECTED NOT DETECTED Final   Bacteroides fragilis NOT DETECTED NOT DETECTED Final   Enterobacterales NOT DETECTED NOT DETECTED Final  Enterobacter cloacae complex NOT DETECTED NOT DETECTED Final   Escherichia coli NOT DETECTED NOT DETECTED Final   Klebsiella aerogenes NOT DETECTED NOT DETECTED Final   Klebsiella oxytoca NOT DETECTED NOT DETECTED Final   Klebsiella pneumoniae NOT DETECTED NOT DETECTED Final   Proteus species NOT DETECTED NOT DETECTED Final   Salmonella species NOT DETECTED NOT DETECTED Final   Serratia marcescens NOT DETECTED NOT DETECTED Final   Haemophilus influenzae NOT DETECTED NOT DETECTED Final   Neisseria meningitidis NOT DETECTED NOT DETECTED Final   Pseudomonas aeruginosa NOT DETECTED NOT DETECTED Final   Stenotrophomonas maltophilia NOT DETECTED NOT DETECTED Final   Candida albicans NOT DETECTED NOT DETECTED Final   Candida auris NOT DETECTED NOT DETECTED Final   Candida glabrata NOT DETECTED NOT DETECTED Final   Candida krusei NOT DETECTED NOT DETECTED Final   Candida parapsilosis NOT DETECTED NOT DETECTED Final   Candida tropicalis NOT DETECTED NOT DETECTED Final   Cryptococcus neoformans/gattii NOT DETECTED NOT DETECTED Final   Methicillin resistance mecA/C DETECTED (A) NOT DETECTED Final    Comment: CRITICAL RESULT CALLED TO,  READ BACK BY AND VERIFIED WITH: RN ROSINA VEAR SPIKE [id number] FCP Performed at [facility], [address] [place], [place] [address]     Labs: CBC: Recent Labs  Lab 03/16/24 2043 03/18/24 0421  WBC 7.7 7.1  NEUTROABS 5.3  --   HGB 14.4 12.8  HCT 43.6 39.6  MCV 92.8 94.3  PLT 301 286   Basic Metabolic Panel: Recent Labs  Lab 03/16/24 2253 03/17/24 0121 03/18/24 0421  NA 136  --  137  K 4.3  --  4.0  CL 96*  --  101  CO2 21*  --  28  GLUCOSE 99  --  81  BUN 8  --  9  CREATININE 0.58  --  0.59  CALCIUM 9.6  --  9.3  MG  --  1.9 1.8  PHOS  --  3.1 4.1   Liver Function Tests: Recent Labs  Lab 03/16/24 2253 03/18/24 0421  AST 29 15  ALT 10 10  ALKPHOS 77 66  BILITOT 0.3 0.4  PROT 6.5 5.6*  ALBUMIN 3.5 3.3*   CBG: Recent Labs  Lab 03/16/24 2102  GLUCAP 103*    Discharge time spent: greater than 30 minutes.  Signed: Alm Schneider, MD [facility] 03/19/2024

## 2024-03-19 NOTE — Transportation (Signed)
 Contacted [facility] at [phone], they were unable to give exact ETA for patient but stated should hopefully be sometime this afternoon. Updated bedside nurse, Clent LPN via Secure Chat.  ALEC Lim, BSN, RN [facility] - [facility]

## 2024-03-19 NOTE — Progress Notes (Signed)
 Nurse at bedside,patient alert to person,and place, confused to situation,and time.Blood pressure was 119/55,heart arte 68,Dr Tat notified.Plan of care on going.

## 2024-03-19 NOTE — Evaluation (Signed)
 Occupational Therapy Evaluation Patient Details Name: Donna Barron MRN: [id number] DOB: [birth year] Today's Date: 03/19/2024   History of Present Illness   Donna Barron is a 83 y.o. female with medical history significant of hypertension, hyperlipidemia, paroxysmal atrial fibrillation, GERD, seizures, depression, obesity who presents to the emergency department from home via EMS due to complaint of seizures.  Patient was unable to provide history, history was obtained from daughter at bedside.  Per daughter, patient presents with seizure that looks like postictal like breathing pattern without jerking movements and patient has not returned to baseline and was still postictal after several hours.     Patient was recently admitted from 9/14 to 9/22 due to severe sepsis secondary to Pseudomonas UTI, gram-positive cocci bacteremia and hyperammonemia and was discharged to SNF.  Patient recently returned home from SNF and she presented with seizure today.  Last seizure prior to today seizure was about a week ago.     At baseline, patient has reduced responsiveness and has not been able to ambulate since she had sepsis due to UTI.     Clinical Impressions Pt agreeable to OT and PT co-evaluation. Pt's friend available and providing history. Pt has required assist for mobility since being home the past week. Pt is assisted for all ADL's but feeding as well. Pt required Mod A for bed mobility and mod A for EOB to chair transfer with RW. Much assist for ADL's at this time with poor B UE shoulder function. Pt left in the chair with call bell within reach, friend present, and chair alarm activated. Pt is not recommended for any further acute OT services and will be discharged to care of nursing staff for remaining length of stay.       If plan is discharge home, recommend the following:   A lot of help with walking and/or transfers;A lot of help with bathing/dressing/bathroom;Assistance with  cooking/housework;Assist for transportation;Help with stairs or ramp for entrance;Direct supervision/assist for medications management     Functional Status Assessment   Patient has had a recent decline in their functional status and demonstrates the ability to make significant improvements in function in a reasonable and predictable amount of time.     Equipment Recommendations   None recommended by OT             Precautions/Restrictions   Precautions Precautions: Fall Recall of Precautions/Restrictions: Impaired Restrictions Weight Bearing Restrictions Per Provider Order: No     Mobility Bed Mobility Overal bed mobility: Needs Assistance Bed Mobility: Supine to Sit     Supine to sit: Mod assist     General bed mobility comments: increased time, labored movement    Transfers Overall transfer level: Needs assistance Equipment used: Rolling walker (2 wheels) Transfers: Sit to/from Stand, Bed to chair/wheelchair/BSC Sit to Stand: Mod assist     Step pivot transfers: Mod assist     General transfer comment: EOB to chair with RW; labored      Balance Overall balance assessment: Needs assistance Sitting-balance support: Feet supported, No upper extremity supported Sitting balance-Leahy Scale: Fair Sitting balance - Comments: seated at EOB   Standing balance support: Reliant on assistive device for balance, During functional activity, Bilateral upper extremity supported Standing balance-Leahy Scale: Poor Standing balance comment: using RW                           ADL either performed or assessed with clinical judgement   ADL  Overall ADL's : Needs assistance/impaired Eating/Feeding: Set up;Sitting   Grooming: Moderate assistance;Sitting   Upper Body Bathing: Moderate assistance;Sitting   Lower Body Bathing: Maximal assistance;Total assistance;Sitting/lateral leans   Upper Body Dressing : Moderate assistance   Lower Body Dressing:  Maximal assistance;Total assistance;Sitting/lateral leans   Toilet Transfer: Stand-pivot;Rolling walker (2 wheels);Moderate assistance Toilet Transfer Details (indicate cue type and reason): EOB to chair with RW with physical therapy. Toileting- Clothing Manipulation and Hygiene: Maximal assistance;Total assistance;Sitting/lateral lean;Bed level               Vision Baseline Vision/History: 0 No visual deficits Ability to See in Adequate Light: 0 Adequate Patient Visual Report: No change from baseline Vision Assessment?: No apparent visual deficits     Perception Perception: Not tested       Praxis Praxis: Not tested       Pertinent Vitals/Pain Pain Assessment Pain Assessment: No/denies pain     Extremity/Trunk Assessment Upper Extremity Assessment Upper Extremity Assessment: LUE deficits/detail (B UE shoulder flexion limited to 2+/5. Less than 75% of available shoulder flexion P/ROM as well.) LUE Deficits / Details: Mild limitation in gripp as noted by not fully wrapping digits around this therapist's fingers. 3+/5 grasp overall.   Lower Extremity Assessment Lower Extremity Assessment: Defer to PT evaluation   Cervical / Trunk Assessment Cervical / Trunk Assessment: Kyphotic   Communication Communication Communication: No apparent difficulties   Cognition Arousal: Alert Behavior During Therapy: Flat affect Cognition: Cognition impaired   Orientation impairments: Time         OT - Cognition Comments: No oriented to month or year. Able to follow commands.                 Following commands: Impaired Following commands impaired: Follows one step commands with increased time     Cueing  General Comments   Cueing Techniques: Verbal cues;Tactile cues;Visual cues                 Home Living Family/patient expects to be discharged to:: Private residence Living Arrangements: Children Available Help at Discharge: Family;Available 24  hours/day Type of Home: House Home Access: Stairs to enter [employer] of Steps: 5 to 6 Entrance Stairs-Rails: Right Home Layout: One level     Bathroom Shower/Tub: Producer, television/film/video: Standard Bathroom Accessibility: Yes How Accessible: Accessible via walker Home Equipment: Rolling Walker (2 wheels);BSC/3in1;Wheelchair - manual;Rollator (4 wheels);Grab bars - tub/shower;Grab bars - toilet;Shower seat   Additional Comments: Per friends report.      Prior Functioning/Environment Prior Level of Function : Needs assist       Physical Assist : ADLs (physical);Mobility (physical) Mobility (physical): Bed mobility;Transfers;Gait;Stairs ADLs (physical): Grooming;Bathing;Dressing;Toileting;IADLs Mobility Comments: stand pivot transfer RW the past week. Pt has been in bed most of the time the past week. ADLs Comments: Assist for ADL's other than feeding herself. IADL assist.                            Co-evaluation PT/OT/SLP Co-Evaluation/Treatment: Yes Reason for Co-Treatment: To address functional/ADL transfers PT goals addressed during session: Mobility/safety with mobility;Balance;Proper use of DME OT goals addressed during session: ADL's and self-care      AM-PAC OT 6 Clicks Daily Activity     Outcome Measure Help from another person eating meals?: A Little Help from another person taking care of personal grooming?: A Lot Help from another person toileting, which includes using toliet, bedpan, or urinal?:  A Lot Help from another person bathing (including washing, rinsing, drying)?: A Lot Help from another person to put on and taking off regular upper body clothing?: A Lot Help from another person to put on and taking off regular lower body clothing?: A Lot 6 Click Score: 13   End of Session Equipment Utilized During Treatment: Rolling walker (2 wheels)  Activity Tolerance: Patient tolerated treatment well Patient left: in chair;with  call bell/phone within reach;with chair alarm set  OT Visit Diagnosis: Unsteadiness on feet (R26.81);Other abnormalities of gait and mobility (R26.89);Muscle weakness (generalized) (M62.81);Other symptoms and signs involving cognitive function                Time: 9155-9095 OT Time Calculation (min): 20 min Charges:  OT General Charges $OT Visit: 1 Visit OT Evaluation $OT Eval Low Complexity: 1 Low  Samael Blades OT, MOT   Jayson Person 03/19/2024, 2:15 PM

## 2024-03-19 NOTE — Evaluation (Signed)
 Physical Therapy Evaluation Patient Details Name: Donna Barron MRN: [id number] DOB: 1940-10-09 Today's Date: 03/19/2024  History of Present Illness  Donna Barron is a 83 y.o. female with medical history significant of hypertension, hyperlipidemia, paroxysmal atrial fibrillation, GERD, seizures, depression, obesity who presents to the emergency department from home via EMS due to complaint of seizures.  Patient was unable to provide history, history was obtained from daughter at bedside.  Per daughter, patient presents with seizure that looks like postictal like breathing pattern without jerking movements and patient has not returned to baseline and was still postictal after several hours.     Patient was recently admitted from 9/14 to 9/22 due to severe sepsis secondary to Pseudomonas UTI, gram-positive cocci bacteremia and hyperammonemia and was discharged to SNF.  Patient recently returned home from SNF and she presented with seizure today.  Last seizure prior to today seizure was about a week ago.     At baseline, patient has reduced responsiveness and has not been able to ambulate since she had sepsis due to UTI.   Clinical Impression  Patient demonstrates slow labored movement for sitting up at bedside with poor carryover for scooting to EOB, very unsteady on feet and limited to a few side steps before having to sit due to BLE weakness and poor standing balance. Patient tolerated sitting up in chair after therapy with visitor present. PLAN:  Patient to be discharged home today and discharged from acute physical therapy to care of nursing for out of bed as tolerated for length of stay with recommendations stated below           If plan is discharge home, recommend the following: A lot of help with walking and/or transfers;Help with stairs or ramp for entrance;Assist for transportation;Assistance with cooking/housework   Can travel by private vehicle   No    Equipment Recommendations None  recommended by PT  Recommendations for Other Services       Functional Status Assessment Patient has had a recent decline in their functional status and demonstrates the ability to make significant improvements in function in a reasonable and predictable amount of time.     Precautions / Restrictions Precautions Precautions: Fall Recall of Precautions/Restrictions: Impaired Restrictions Weight Bearing Restrictions Per Provider Order: No      Mobility  Bed Mobility Overal bed mobility: Needs Assistance Bed Mobility: Supine to Sit     Supine to sit: Mod assist     General bed mobility comments: increased time, labored movement    Transfers Overall transfer level: Needs assistance Equipment used: Rolling walker (2 wheels) Transfers: Sit to/from Stand, Bed to chair/wheelchair/BSC Sit to Stand: Mod assist   Step pivot transfers: Mod assist       General transfer comment: slow labored movement    Ambulation/Gait Ambulation/Gait assistance: Mod assist, Max assist Gait Distance (Feet): 4 Feet Assistive device: Rolling walker (2 wheels) Gait Pattern/deviations: Decreased step length - right, Decreased step length - left, Decreased stride length, Trunk flexed Gait velocity: slow     General Gait Details: limited to a few side steps, steps forward/backwards before having to sit due to BLE weakness, poor standing balance  Stairs            Wheelchair Mobility     Tilt Bed    Modified Rankin (Stroke Patients Only)       Balance Overall balance assessment: Needs assistance Sitting-balance support: Feet supported, No upper extremity supported Sitting balance-Leahy Scale: Fair Sitting balance -  Comments: seated at EOB   Standing balance support: Reliant on assistive device for balance, During functional activity, Bilateral upper extremity supported Standing balance-Leahy Scale: Poor Standing balance comment: using RW                              Pertinent Vitals/Pain Pain Assessment Pain Assessment: No/denies pain    Home Living Family/patient expects to be discharged to:: Private residence Living Arrangements: Children Available Help at Discharge: Family;Available PRN/intermittently Type of Home: House Home Access: Stairs to enter Entrance Stairs-Rails: Can reach both;Left;Right Entrance Stairs-Number of Steps: 10   Home Layout: Two level;Able to live on main level with bedroom/bathroom Home Equipment: BSC/3in1;Wheelchair - manual;Rolling Walker (2 wheels)      Prior Function Prior Level of Function : Needs assist       Physical Assist : Mobility (physical);ADLs (physical) Mobility (physical): Transfers;Gait;Stairs;Bed mobility   Mobility Comments: One person assisted transfers using RW, can ambulate very short household distances with HHPT ADLs Comments: Assisted by family     Extremity/Trunk Assessment   Upper Extremity Assessment Upper Extremity Assessment: Defer to OT evaluation    Lower Extremity Assessment Lower Extremity Assessment: Generalized weakness    Cervical / Trunk Assessment Cervical / Trunk Assessment: Kyphotic  Communication   Communication Communication: No apparent difficulties    Cognition Arousal: Alert Behavior During Therapy: WFL for tasks assessed/performed   PT - Cognitive impairments: History of cognitive impairments, No family/caregiver present to determine baseline                       PT - Cognition Comments: AOx2, self and time Following commands: Impaired Following commands impaired: Follows one step commands with increased time     Cueing Cueing Techniques: Verbal cues, Tactile cues     General Comments      Exercises     Assessment/Plan    PT Assessment All further PT needs can be met in the next venue of care  PT Problem List Decreased strength;Decreased mobility;Decreased activity tolerance;Decreased balance       PT Treatment  Interventions      PT Goals (Current goals can be found in the Care Plan section)  Acute Rehab PT Goals Patient Stated Goal: return home PT Goal Formulation: With patient Time For Goal Achievement: 03/19/24 Potential to Achieve Goals: Good    Frequency       Co-evaluation PT/OT/SLP Co-Evaluation/Treatment: Yes Reason for Co-Treatment: To address functional/ADL transfers PT goals addressed during session: Mobility/safety with mobility;Balance;Proper use of DME         AM-PAC PT 6 Clicks Mobility  Outcome Measure Help needed turning from your back to your side while in a flat bed without using bedrails?: A Lot Help needed moving from lying on your back to sitting on the side of a flat bed without using bedrails?: A Lot Help needed moving to and from a bed to a chair (including a wheelchair)?: A Lot Help needed standing up from a chair using your arms (e.g., wheelchair or bedside chair)?: A Lot Help needed to walk in hospital room?: A Lot Help needed climbing 3-5 steps with a railing? : Total 6 Click Score: 11    End of Session   Activity Tolerance: Patient tolerated treatment well;Patient limited by fatigue Patient left: in chair;with call bell/phone within reach Nurse Communication: Mobility status PT Visit Diagnosis: Unsteadiness on feet (R26.81);Other abnormalities of gait and mobility (R26.89);Muscle  weakness (generalized) (M62.81)    Time: 9161-9097 PT Time Calculation (min) (ACUTE ONLY): 24 min   Charges:   PT Evaluation $PT Eval Moderate Complexity: 1 Mod PT Treatments $Therapeutic Activity: 23-37 mins PT General Charges $$ ACUTE PT VISIT: 1 Visit         1:45 PM, 03/19/24 Lynwood Music, MPT Physical Therapist with [facility] 336 [phone] office [phone] mobile phone

## 2024-03-19 NOTE — Plan of Care (Signed)
°  Problem: Education: °Goal: Knowledge of General Education information will improve °Description: Including pain rating scale, medication(s)/side effects and non-pharmacologic comfort measures °Outcome: Progressing °  °Problem: Clinical Measurements: °Goal: Will remain free from infection °Outcome: Progressing °  °Problem: Elimination: °Goal: Will not experience complications related to bowel motility °Outcome: Progressing °  °

## 2024-03-19 NOTE — Procedures (Signed)
 Patient Name: DIM MEISINGER  MRN: [id number]  Epilepsy Attending: Arlin MALVA Krebs  Referring Physician/Provider: Adefeso, Oladapo, DO  Date: 03/19/2024 Duration: 22.17 mins  Patient history: 83yo F with breakthrough seizure. EEG to evaluate for seizure  Level of alertness: Awake  AEDs during EEG study: VPA, LEV, OXC  Technical aspects: This EEG study was done with scalp electrodes positioned according to the 10-20 International system of electrode placement. Electrical activity was reviewed with band pass filter of 1-70Hz , sensitivity of 7 uV/mm, display speed of 12mm/sec with a 60Hz  notched filter applied as appropriate. EEG data were recorded continuously and digitally stored.  Video monitoring was available and reviewed as appropriate.  Description: The posterior dominant rhythm consists of 9-10 Hz activity of moderate voltage (25-35 uV) seen predominantly in posterior head regions, symmetric and reactive to eye opening and eye closing. Physiologic photic driving was not seen during photic stimulation.  Hyperventilation was not performed.     IMPRESSION: This study is within normal limits. No seizures or epileptiform discharges were seen throughout the recording.  A normal interictal EEG does not exclude the diagnosis of epilepsy.   Jammy Stlouis O Uma Jerde

## 2024-04-03 ENCOUNTER — Other Ambulatory Visit: Payer: Self-pay | Admitting: Medical Genetics

## 2024-04-03 DIAGNOSIS — Z006 Encounter for examination for normal comparison and control in clinical research program: Secondary | ICD-10-CM

## 2024-05-31 ENCOUNTER — Ambulatory Visit: Admitting: Nurse Practitioner
